# Patient Record
Sex: Female | Born: 1950
Health system: Southern US, Community
[De-identification: ages and names within clinical notes are randomized; demographics above are authoritative.]

## PROBLEM LIST (undated history)

## (undated) DIAGNOSIS — M1990 Unspecified osteoarthritis, unspecified site: Secondary | ICD-10-CM

## (undated) DIAGNOSIS — D649 Anemia, unspecified: Secondary | ICD-10-CM

## (undated) DIAGNOSIS — Z8489 Family history of other specified conditions: Secondary | ICD-10-CM

## (undated) DIAGNOSIS — J189 Pneumonia, unspecified organism: Secondary | ICD-10-CM

## (undated) DIAGNOSIS — Z87442 Personal history of urinary calculi: Secondary | ICD-10-CM

## (undated) DIAGNOSIS — F419 Anxiety disorder, unspecified: Secondary | ICD-10-CM

## (undated) DIAGNOSIS — F329 Major depressive disorder, single episode, unspecified: Secondary | ICD-10-CM

## (undated) DIAGNOSIS — I2510 Atherosclerotic heart disease of native coronary artery without angina pectoris: Secondary | ICD-10-CM

## (undated) DIAGNOSIS — E039 Hypothyroidism, unspecified: Secondary | ICD-10-CM

## (undated) DIAGNOSIS — K219 Gastro-esophageal reflux disease without esophagitis: Secondary | ICD-10-CM

## (undated) DIAGNOSIS — E079 Disorder of thyroid, unspecified: Secondary | ICD-10-CM

## (undated) DIAGNOSIS — Z973 Presence of spectacles and contact lenses: Secondary | ICD-10-CM

## (undated) DIAGNOSIS — IMO0001 Reserved for inherently not codable concepts without codable children: Secondary | ICD-10-CM

## (undated) DIAGNOSIS — F32A Depression, unspecified: Secondary | ICD-10-CM

## (undated) DIAGNOSIS — I10 Essential (primary) hypertension: Secondary | ICD-10-CM

## (undated) DIAGNOSIS — M75121 Complete rotator cuff tear or rupture of right shoulder, not specified as traumatic: Secondary | ICD-10-CM

## (undated) DIAGNOSIS — I2699 Other pulmonary embolism without acute cor pulmonale: Secondary | ICD-10-CM

## (undated) DIAGNOSIS — K5790 Diverticulosis of intestine, part unspecified, without perforation or abscess without bleeding: Secondary | ICD-10-CM

## (undated) DIAGNOSIS — K5792 Diverticulitis of intestine, part unspecified, without perforation or abscess without bleeding: Secondary | ICD-10-CM

## (undated) HISTORY — PX: KNEE CARTILAGE SURGERY: SHX688

## (undated) HISTORY — PX: ESOPHAGOGASTRODUODENOSCOPY ENDOSCOPY: SHX5814

## (undated) HISTORY — DX: Disorder of thyroid, unspecified: E07.9

## (undated) HISTORY — PX: OTHER SURGICAL HISTORY: SHX169

## (undated) HISTORY — PX: ABDOMINAL HYSTERECTOMY: SHX81

## (undated) HISTORY — PX: COLONOSCOPY: SHX174

## (undated) HISTORY — PX: CATARACT EXTRACTION, BILATERAL: SHX1313

---

## 1998-02-15 DIAGNOSIS — Z90710 Acquired absence of both cervix and uterus: Secondary | ICD-10-CM | POA: Diagnosis present

## 1998-10-20 ENCOUNTER — Encounter: Payer: Self-pay | Admitting: Internal Medicine

## 1998-10-20 ENCOUNTER — Ambulatory Visit: Admission: RE | Admit: 1998-10-20 | Discharge: 1998-10-20 | Payer: Self-pay | Admitting: Internal Medicine

## 1999-01-08 ENCOUNTER — Encounter: Admission: RE | Admit: 1999-01-08 | Discharge: 1999-04-08 | Payer: Self-pay | Admitting: Internal Medicine

## 1999-11-19 ENCOUNTER — Other Ambulatory Visit: Admission: RE | Admit: 1999-11-19 | Discharge: 1999-11-19 | Payer: Self-pay | Admitting: Internal Medicine

## 1999-12-02 ENCOUNTER — Encounter: Payer: Self-pay | Admitting: Internal Medicine

## 1999-12-02 ENCOUNTER — Ambulatory Visit (HOSPITAL_COMMUNITY): Admission: RE | Admit: 1999-12-02 | Discharge: 1999-12-02 | Payer: Self-pay | Admitting: Internal Medicine

## 2000-09-27 ENCOUNTER — Encounter: Payer: Self-pay | Admitting: Internal Medicine

## 2000-09-27 ENCOUNTER — Encounter: Admission: RE | Admit: 2000-09-27 | Discharge: 2000-09-27 | Payer: Self-pay | Admitting: Internal Medicine

## 2000-10-13 ENCOUNTER — Ambulatory Visit (HOSPITAL_COMMUNITY): Admission: RE | Admit: 2000-10-13 | Discharge: 2000-10-13 | Payer: Self-pay | Admitting: Cardiology

## 2000-10-16 ENCOUNTER — Ambulatory Visit (HOSPITAL_COMMUNITY): Admission: RE | Admit: 2000-10-16 | Discharge: 2000-10-16 | Payer: Self-pay | Admitting: Cardiology

## 2001-05-25 ENCOUNTER — Encounter: Payer: Self-pay | Admitting: Internal Medicine

## 2001-05-25 ENCOUNTER — Encounter: Admission: RE | Admit: 2001-05-25 | Discharge: 2001-05-25 | Payer: Self-pay | Admitting: Internal Medicine

## 2001-09-18 ENCOUNTER — Encounter: Payer: Self-pay | Admitting: Internal Medicine

## 2001-09-18 ENCOUNTER — Ambulatory Visit (HOSPITAL_COMMUNITY): Admission: RE | Admit: 2001-09-18 | Discharge: 2001-09-18 | Payer: Self-pay | Admitting: Internal Medicine

## 2002-11-21 ENCOUNTER — Other Ambulatory Visit: Admission: RE | Admit: 2002-11-21 | Discharge: 2002-11-21 | Payer: Self-pay | Admitting: Internal Medicine

## 2004-07-27 ENCOUNTER — Ambulatory Visit (HOSPITAL_COMMUNITY): Admission: RE | Admit: 2004-07-27 | Discharge: 2004-07-27 | Payer: Self-pay | Admitting: Internal Medicine

## 2005-08-18 ENCOUNTER — Ambulatory Visit (HOSPITAL_COMMUNITY): Admission: RE | Admit: 2005-08-18 | Discharge: 2005-08-18 | Payer: Self-pay | Admitting: Ophthalmology

## 2005-09-15 ENCOUNTER — Ambulatory Visit (HOSPITAL_COMMUNITY): Admission: RE | Admit: 2005-09-15 | Discharge: 2005-09-15 | Payer: Self-pay | Admitting: Ophthalmology

## 2005-12-07 ENCOUNTER — Ambulatory Visit (HOSPITAL_COMMUNITY): Admission: RE | Admit: 2005-12-07 | Discharge: 2005-12-07 | Payer: Self-pay | Admitting: Internal Medicine

## 2006-06-10 ENCOUNTER — Emergency Department (HOSPITAL_COMMUNITY): Admission: EM | Admit: 2006-06-10 | Discharge: 2006-06-10 | Payer: Self-pay | Admitting: Emergency Medicine

## 2006-07-26 ENCOUNTER — Ambulatory Visit (HOSPITAL_COMMUNITY): Admission: RE | Admit: 2006-07-26 | Discharge: 2006-07-26 | Payer: Self-pay | Admitting: Internal Medicine

## 2006-08-16 ENCOUNTER — Ambulatory Visit: Payer: Self-pay | Admitting: Internal Medicine

## 2006-09-05 LAB — LACTATE DEHYDROGENASE: LDH: 161 U/L (ref 94–250)

## 2006-09-05 LAB — CBC WITH DIFFERENTIAL/PLATELET
BASO%: 0.4 % (ref 0.0–2.0)
Basophils Absolute: 0 10*3/uL (ref 0.0–0.1)
EOS%: 7 % (ref 0.0–7.0)
MCH: 28.8 pg (ref 26.0–34.0)
MCHC: 33.8 g/dL (ref 32.0–36.0)
MCV: 85.1 fL (ref 81.0–101.0)
MONO%: 6.9 % (ref 0.0–13.0)
RBC: 4.49 10*6/uL (ref 3.70–5.32)
RDW: 13.6 % (ref 11.3–14.5)
lymph#: 3.1 10*3/uL (ref 0.9–3.3)

## 2006-10-11 ENCOUNTER — Ambulatory Visit (HOSPITAL_COMMUNITY): Admission: RE | Admit: 2006-10-11 | Discharge: 2006-10-11 | Payer: Self-pay | Admitting: Internal Medicine

## 2006-10-30 ENCOUNTER — Inpatient Hospital Stay (HOSPITAL_COMMUNITY): Admission: EM | Admit: 2006-10-30 | Discharge: 2006-10-31 | Payer: Self-pay | Admitting: Emergency Medicine

## 2006-12-07 ENCOUNTER — Ambulatory Visit: Payer: Self-pay | Admitting: Internal Medicine

## 2006-12-11 ENCOUNTER — Encounter (INDEPENDENT_AMBULATORY_CARE_PROVIDER_SITE_OTHER): Payer: Self-pay | Admitting: Specialist

## 2006-12-11 ENCOUNTER — Ambulatory Visit: Payer: Self-pay | Admitting: Internal Medicine

## 2006-12-11 DIAGNOSIS — K5730 Diverticulosis of large intestine without perforation or abscess without bleeding: Secondary | ICD-10-CM | POA: Insufficient documentation

## 2006-12-11 DIAGNOSIS — K648 Other hemorrhoids: Secondary | ICD-10-CM | POA: Insufficient documentation

## 2007-02-09 ENCOUNTER — Ambulatory Visit (HOSPITAL_COMMUNITY): Admission: RE | Admit: 2007-02-09 | Discharge: 2007-02-09 | Payer: Self-pay | Admitting: Internal Medicine

## 2007-02-21 ENCOUNTER — Encounter: Admission: RE | Admit: 2007-02-21 | Discharge: 2007-02-21 | Payer: Self-pay | Admitting: Internal Medicine

## 2007-03-09 ENCOUNTER — Ambulatory Visit (HOSPITAL_COMMUNITY): Admission: RE | Admit: 2007-03-09 | Discharge: 2007-03-09 | Payer: Self-pay | Admitting: Internal Medicine

## 2007-04-17 ENCOUNTER — Ambulatory Visit (HOSPITAL_BASED_OUTPATIENT_CLINIC_OR_DEPARTMENT_OTHER): Admission: RE | Admit: 2007-04-17 | Discharge: 2007-04-17 | Payer: Self-pay | Admitting: Orthopedic Surgery

## 2007-06-21 ENCOUNTER — Encounter: Admission: RE | Admit: 2007-06-21 | Discharge: 2007-08-23 | Payer: Self-pay | Admitting: Orthopedic Surgery

## 2008-04-11 ENCOUNTER — Inpatient Hospital Stay (HOSPITAL_COMMUNITY): Admission: EM | Admit: 2008-04-11 | Discharge: 2008-04-16 | Payer: Self-pay | Admitting: Emergency Medicine

## 2008-04-12 ENCOUNTER — Encounter: Payer: Self-pay | Admitting: Internal Medicine

## 2008-04-14 ENCOUNTER — Encounter: Payer: Self-pay | Admitting: Gastroenterology

## 2008-04-16 ENCOUNTER — Encounter: Payer: Self-pay | Admitting: Internal Medicine

## 2008-04-17 ENCOUNTER — Ambulatory Visit: Payer: Self-pay | Admitting: Gastroenterology

## 2008-04-29 DIAGNOSIS — E039 Hypothyroidism, unspecified: Secondary | ICD-10-CM | POA: Insufficient documentation

## 2008-04-29 DIAGNOSIS — I10 Essential (primary) hypertension: Secondary | ICD-10-CM | POA: Insufficient documentation

## 2008-04-29 DIAGNOSIS — J45909 Unspecified asthma, uncomplicated: Secondary | ICD-10-CM | POA: Insufficient documentation

## 2008-04-29 DIAGNOSIS — E669 Obesity, unspecified: Secondary | ICD-10-CM | POA: Insufficient documentation

## 2008-04-29 DIAGNOSIS — F32A Depression, unspecified: Secondary | ICD-10-CM | POA: Insufficient documentation

## 2008-04-29 DIAGNOSIS — F329 Major depressive disorder, single episode, unspecified: Secondary | ICD-10-CM

## 2008-04-30 ENCOUNTER — Ambulatory Visit: Payer: Self-pay | Admitting: Internal Medicine

## 2008-04-30 ENCOUNTER — Telehealth (INDEPENDENT_AMBULATORY_CARE_PROVIDER_SITE_OTHER): Payer: Self-pay

## 2008-04-30 LAB — CONVERTED CEMR LAB
Basophils Relative: 0 % (ref 0.0–1.0)
Folate: 4.3 ng/mL
HCT: 38.4 % (ref 36.0–46.0)
Hemoglobin: 12.6 g/dL (ref 12.0–15.0)
Iron: 49 ug/dL (ref 42–145)
Monocytes Absolute: 0.4 10*3/uL (ref 0.1–1.0)
Monocytes Relative: 2.9 % — ABNORMAL LOW (ref 3.0–12.0)
Neutro Abs: 11.9 10*3/uL — ABNORMAL HIGH (ref 1.4–7.7)
RBC: 4.86 M/uL (ref 3.87–5.11)
RDW: 15 % — ABNORMAL HIGH (ref 11.5–14.6)
Vitamin B-12: 266 pg/mL (ref 211–911)

## 2008-05-05 ENCOUNTER — Ambulatory Visit: Payer: Self-pay | Admitting: Internal Medicine

## 2008-05-05 DIAGNOSIS — E538 Deficiency of other specified B group vitamins: Secondary | ICD-10-CM | POA: Insufficient documentation

## 2008-06-13 ENCOUNTER — Ambulatory Visit: Payer: Self-pay | Admitting: Internal Medicine

## 2008-06-27 ENCOUNTER — Observation Stay (HOSPITAL_COMMUNITY): Admission: EM | Admit: 2008-06-27 | Discharge: 2008-06-28 | Payer: Self-pay | Admitting: Emergency Medicine

## 2008-07-08 ENCOUNTER — Telehealth: Payer: Self-pay | Admitting: Internal Medicine

## 2008-07-10 ENCOUNTER — Ambulatory Visit: Payer: Self-pay | Admitting: Internal Medicine

## 2008-07-24 ENCOUNTER — Other Ambulatory Visit: Admission: RE | Admit: 2008-07-24 | Discharge: 2008-07-24 | Payer: Self-pay | Admitting: Internal Medicine

## 2008-08-27 ENCOUNTER — Ambulatory Visit (HOSPITAL_COMMUNITY): Admission: RE | Admit: 2008-08-27 | Discharge: 2008-08-27 | Payer: Self-pay | Admitting: Cardiology

## 2008-08-28 ENCOUNTER — Ambulatory Visit (HOSPITAL_COMMUNITY): Admission: RE | Admit: 2008-08-28 | Discharge: 2008-08-28 | Payer: Self-pay | Admitting: Cardiology

## 2008-09-04 ENCOUNTER — Ambulatory Visit: Payer: Self-pay | Admitting: Internal Medicine

## 2008-09-13 ENCOUNTER — Emergency Department (HOSPITAL_COMMUNITY): Admission: EM | Admit: 2008-09-13 | Discharge: 2008-09-13 | Payer: Self-pay | Admitting: Emergency Medicine

## 2008-09-29 ENCOUNTER — Ambulatory Visit: Payer: Self-pay | Admitting: Internal Medicine

## 2008-10-01 LAB — CONVERTED CEMR LAB
Basophils Absolute: 0.2 10*3/uL — ABNORMAL HIGH (ref 0.0–0.1)
Eosinophils Absolute: 0.8 10*3/uL — ABNORMAL HIGH (ref 0.0–0.7)
Ferritin: 9 ng/mL — ABNORMAL LOW (ref 10.0–291.0)
HCT: 37.5 % (ref 36.0–46.0)
MCV: 80 fL (ref 78.0–100.0)
Monocytes Absolute: 0.6 10*3/uL (ref 0.1–1.0)
Platelets: 351 10*3/uL (ref 150–400)
RDW: 14 % (ref 11.5–14.6)

## 2008-10-07 ENCOUNTER — Ambulatory Visit: Payer: Self-pay | Admitting: Internal Medicine

## 2009-01-15 ENCOUNTER — Ambulatory Visit: Payer: Self-pay | Admitting: Internal Medicine

## 2009-01-16 LAB — CONVERTED CEMR LAB
Eosinophils Absolute: 0.8 10*3/uL — ABNORMAL HIGH (ref 0.0–0.7)
Eosinophils Relative: 6.7 % — ABNORMAL HIGH (ref 0.0–5.0)
Lymphocytes Relative: 32.8 % (ref 12.0–46.0)
Monocytes Relative: 5.5 % (ref 3.0–12.0)
Neutrophils Relative %: 54 % (ref 43.0–77.0)
Platelets: 332 10*3/uL (ref 150–400)
WBC: 11.7 10*3/uL — ABNORMAL HIGH (ref 4.5–10.5)

## 2009-01-19 ENCOUNTER — Telehealth: Payer: Self-pay | Admitting: Internal Medicine

## 2009-02-05 ENCOUNTER — Encounter: Payer: Self-pay | Admitting: Internal Medicine

## 2009-02-05 ENCOUNTER — Encounter (HOSPITAL_COMMUNITY): Admission: RE | Admit: 2009-02-05 | Discharge: 2009-03-19 | Payer: Self-pay | Admitting: Internal Medicine

## 2009-10-01 ENCOUNTER — Encounter (INDEPENDENT_AMBULATORY_CARE_PROVIDER_SITE_OTHER): Payer: Self-pay | Admitting: Otolaryngology

## 2009-10-01 ENCOUNTER — Ambulatory Visit (HOSPITAL_COMMUNITY): Admission: RE | Admit: 2009-10-01 | Discharge: 2009-10-02 | Payer: Self-pay | Admitting: Otolaryngology

## 2009-11-04 ENCOUNTER — Telehealth: Payer: Self-pay | Admitting: Internal Medicine

## 2009-11-04 ENCOUNTER — Ambulatory Visit: Payer: Self-pay | Admitting: Gastroenterology

## 2009-11-04 LAB — CONVERTED CEMR LAB: Creatinine, Ser: 0.9 mg/dL (ref 0.4–1.2)

## 2009-11-05 ENCOUNTER — Ambulatory Visit: Payer: Self-pay | Admitting: Cardiology

## 2009-11-05 LAB — CONVERTED CEMR LAB
CRP, High Sensitivity: 62 — ABNORMAL HIGH (ref 0.00–5.00)
Sed Rate: 23 mm/hr — ABNORMAL HIGH (ref 0–22)

## 2009-11-30 ENCOUNTER — Ambulatory Visit: Payer: Self-pay | Admitting: Internal Medicine

## 2009-11-30 LAB — CONVERTED CEMR LAB
Eosinophils Absolute: 0.6 10*3/uL (ref 0.0–0.7)
Eosinophils Relative: 5.3 % — ABNORMAL HIGH (ref 0.0–5.0)
Ferritin: 18.9 ng/mL (ref 10.0–291.0)
MCV: 91.4 fL (ref 78.0–100.0)
Monocytes Absolute: 0.8 10*3/uL (ref 0.1–1.0)
Neutrophils Relative %: 45.6 % (ref 43.0–77.0)
Platelets: 298 10*3/uL (ref 150.0–400.0)
Vitamin B-12: 761 pg/mL (ref 211–911)
WBC: 10.6 10*3/uL — ABNORMAL HIGH (ref 4.5–10.5)

## 2009-12-01 ENCOUNTER — Ambulatory Visit: Payer: Self-pay | Admitting: Internal Medicine

## 2009-12-04 ENCOUNTER — Encounter: Payer: Self-pay | Admitting: Internal Medicine

## 2009-12-11 ENCOUNTER — Ambulatory Visit: Payer: Self-pay | Admitting: Internal Medicine

## 2009-12-18 ENCOUNTER — Ambulatory Visit: Payer: Self-pay | Admitting: Internal Medicine

## 2010-01-07 ENCOUNTER — Encounter: Admission: RE | Admit: 2010-01-07 | Discharge: 2010-01-07 | Payer: Self-pay | Admitting: Internal Medicine

## 2010-01-09 ENCOUNTER — Emergency Department (HOSPITAL_BASED_OUTPATIENT_CLINIC_OR_DEPARTMENT_OTHER): Admission: EM | Admit: 2010-01-09 | Discharge: 2010-01-10 | Payer: Self-pay | Admitting: Emergency Medicine

## 2010-01-10 ENCOUNTER — Ambulatory Visit: Payer: Self-pay | Admitting: Diagnostic Radiology

## 2010-01-14 ENCOUNTER — Ambulatory Visit: Payer: Self-pay | Admitting: Internal Medicine

## 2010-01-21 ENCOUNTER — Encounter: Admission: RE | Admit: 2010-01-21 | Discharge: 2010-01-21 | Payer: Self-pay | Admitting: Internal Medicine

## 2010-01-21 ENCOUNTER — Ambulatory Visit: Payer: Self-pay | Admitting: Internal Medicine

## 2010-02-16 ENCOUNTER — Observation Stay (HOSPITAL_COMMUNITY): Admission: EM | Admit: 2010-02-16 | Discharge: 2010-02-17 | Payer: Self-pay | Admitting: Emergency Medicine

## 2010-02-16 ENCOUNTER — Ambulatory Visit: Payer: Self-pay | Admitting: Cardiology

## 2010-02-16 ENCOUNTER — Emergency Department (HOSPITAL_COMMUNITY): Admission: EM | Admit: 2010-02-16 | Discharge: 2010-02-16 | Payer: Self-pay | Admitting: Family Medicine

## 2010-02-17 ENCOUNTER — Encounter (INDEPENDENT_AMBULATORY_CARE_PROVIDER_SITE_OTHER): Payer: Self-pay | Admitting: Emergency Medicine

## 2010-02-17 ENCOUNTER — Encounter: Payer: Self-pay | Admitting: Internal Medicine

## 2010-02-17 ENCOUNTER — Encounter (INDEPENDENT_AMBULATORY_CARE_PROVIDER_SITE_OTHER): Payer: Self-pay | Admitting: *Deleted

## 2010-02-18 ENCOUNTER — Telehealth: Payer: Self-pay | Admitting: Internal Medicine

## 2010-02-19 ENCOUNTER — Inpatient Hospital Stay (HOSPITAL_COMMUNITY): Admission: EM | Admit: 2010-02-19 | Discharge: 2010-02-21 | Payer: Self-pay | Admitting: Gastroenterology

## 2010-02-19 ENCOUNTER — Ambulatory Visit: Payer: Self-pay | Admitting: Internal Medicine

## 2010-02-19 ENCOUNTER — Telehealth: Payer: Self-pay | Admitting: Internal Medicine

## 2010-02-19 LAB — CONVERTED CEMR LAB
Basophils Relative: 0.3 % (ref 0.0–3.0)
Calcium: 9 mg/dL (ref 8.4–10.5)
Chloride: 95 meq/L — ABNORMAL LOW (ref 96–112)
Creatinine, Ser: 0.8 mg/dL (ref 0.4–1.2)
Eosinophils Relative: 13 % — ABNORMAL HIGH (ref 0.0–5.0)
GFR calc non Af Amer: 78.41 mL/min (ref >60)
Lymphocytes Relative: 22.9 % (ref 12.0–46.0)
Monocytes Relative: 4 % (ref 3.0–12.0)
Neutrophils Relative %: 59.8 % (ref 43.0–77.0)
RBC: 5.14 M/uL — ABNORMAL HIGH (ref 3.87–5.11)
WBC: 16 10*3/uL — ABNORMAL HIGH (ref 4.5–10.5)

## 2010-02-20 ENCOUNTER — Encounter: Payer: Self-pay | Admitting: Internal Medicine

## 2010-02-20 ENCOUNTER — Encounter: Payer: Self-pay | Admitting: Gastroenterology

## 2010-02-21 ENCOUNTER — Encounter: Payer: Self-pay | Admitting: Gastroenterology

## 2010-02-26 ENCOUNTER — Ambulatory Visit: Payer: Self-pay | Admitting: Internal Medicine

## 2010-02-26 DIAGNOSIS — K219 Gastro-esophageal reflux disease without esophagitis: Secondary | ICD-10-CM | POA: Insufficient documentation

## 2010-12-02 ENCOUNTER — Ambulatory Visit
Admission: RE | Admit: 2010-12-02 | Discharge: 2010-12-02 | Payer: Self-pay | Source: Home / Self Care | Attending: Internal Medicine | Admitting: Internal Medicine

## 2010-12-16 NOTE — Procedures (Signed)
Summary: Flexible Sigmoidoscopy  Patient: [name] Note: All result statuses are Final unless otherwise noted.  Tests: (1) Flexible Sigmoidoscopy (FLX)  FLX Flexible Sigmoidoscopy                             DONE     [facility]     [address]     [place], [place]  [address]           FLEXIBLE SIGMOIDOSCOPY PROCEDURE REPORT           PATIENT:  [name], [name]  MR#:  [id number]     BIRTHDATE:  [birth year], 58 yrs. old  GENDER:  female           ENDOSCOPIST:  Iva Boop, MD, [facility]           PROCEDURE DATE:  12/01/2009     PROCEDURE:  Flexible Sigmoidoscopy with biopsy     ASA CLASS:  Class III     INDICATIONS:  abnormal imaging recent LLQ pain, diarrhea and     rectal bleeding     CT suggested mild diverticulitis     hx of segmental left colitis in past 2009 and also prior     incidental cecal ulcers (2008)           MEDICATIONS:   Fentanyl 50 mcg IV, Versed 7 mg IV           DESCRIPTION OF PROCEDURE:   After the risks benefits and     alternatives of the procedure were thoroughly explained, informed     consent was obtained.  Digital rectal exam was performed and     revealed no abnormalities.   The [id number] endoscope     was introduced through the anus and advanced to the descending     colon, without limitations.  The quality of the prep was fair on     descending colon, good elsewhere. The instrument was then slowly     withdrawn as the mucosa was fully examined.     <<PROCEDUREIMAGES>>           Moderate diverticulosis was found in the sigmoid colon.  The     examination was otherwise normal. Random biopsies were obtained     and sent to pathology.   Retroflexed views in the rectum revealed     no abnormalities.    The scope was then withdrawn from the patient     and the procedure terminated.           COMPLICATIONS:  None           ENDOSCOPIC IMPRESSION:     1) Moderate diverticulosis in the sigmoid colon     2) Otherwise  normal examination to descending colon     3) No colitis evident. random biopsied taken. Not clear what     recent problems were caused by, am not convinced CT findings are     diagnostic of diverticulitis. I do think underlying IBS is likely     but ? other process given hx.           RECOMMENDATIONS:     1) await biopsy results     2) Needs oral iron if she can tolerate. if not then would be     reasonable to boost iron stores with IV iron again. Hgb is normal     but ferritin only 18 ( very low  normal). B12 ok.     3) anticipate at least a follow-up in the office in 2 months to     reassess           Iva Boop, MD, [facility]           CC:  Sharlet Salina, MD     The Patient           n.     eSIGNED:   Iva Boop at 12/01/2009 05:05 PM           Florestine Avers, [id number]  Note: An exclamation mark (!) indicates a result that was not dispersed into the flowsheet. Document Creation Date: 12/01/2009 5:05 PM _______________________________________________________________________  (1) Order result status: Final Collection or observation date-time: 12/01/2009 16:52 Requested date-time:  Receipt date-time:  Reported date-time:  Referring Physician:   Ordering Physician: Stan Head [id number]) Specimen Source:  Source: Launa Grill Order Number: [id number] Lab site:

## 2010-12-16 NOTE — Letter (Signed)
Summary: Patient Notice- Colon Biospy Results  [facility]  [address] [place], [place] [address]   Phone: [phone]  Fax: [phone]        December 04, 2009 MRN: [id number]    KENTRELL GUETTLER [address] [place], [place]  [address]    Dear Ms. Wedeking,  I am pleased to inform you that the biopsies taken during your recent colonoscopy did not show any evidence of colitis upon pathologic examination.  Please call [phone] to schedule a return visit to review      your condition. You should be seen in late March, early april 2011.   Continue with the treatment plan as outlined on the day of your      exam.  Please call us if you are having persistent problems or have questions about your condition that have not been fully answered at this time.  Sincerely,  Iva Boop MD, [facility]   This letter has been electronically signed by your physician.  Appended Document: Patient Notice- Colon Biospy Results Letter mailed 1.24.11.

## 2010-12-16 NOTE — Progress Notes (Signed)
Summary: Talk to nurse  Phone Note Call from Patient Call back at Home Phone [phone]   Call For: Dr [name] Summary of Call: It's a long story-wants to talk to nurse directly. Initial call taken by: Leanor Kail [facility],  February 19, 2010 9:16 AM  Follow-up for Phone Call        Patient  c/o vomiting, nausea and pain that started last night.  Her CP she was seen for in the ER on Tues/Wed has resolved.  Patient c/o "left of midline" abdominal pain.  She had a large BM last night, that progressed to watery stools and vomiting. She has had no further stools since last night, but continued vomiting.  Denies fever.  Patient  wants to be seen here today.  I have advised her that we are unable to see her today.  I have asked her to start on a clear liquid diet and slowly advance her diet as tolerated.  Patient has hydrocodone she takes for pain.  She is advised she can take that for pain.   Dr Juanda Chance please advise if needs additional orders. Follow-up by: Darcey Nora RN, CGRN,  February 19, 2010 9:54 AM  Additional Follow-up for Phone Call Additional follow up Details #1::        I reviewed the above with Dr Juanda Chance patient to come for CBC, BMET now.  Once lab is complete she is asked to come to the 3rd floor until we have the results.  Patient  lives in [place] she says it will take her an hour to get here.   Additional Follow-up by: Darcey Nora RN, CGRN,  February 19, 2010 10:11 AM    Additional Follow-up for Phone Call Additional follow up Details #2::    Labs reviewed with Dr Juanda Chance.  Patient  will see Dr Juanda Chance as an addon today Follow-up by: Darcey Nora RN, CGRN,  February 19, 2010 12:12 PM

## 2010-12-16 NOTE — Assessment & Plan Note (Signed)
Summary: add on for elevated WBC/lk.   History of Present Illness Visit Type: Follow-up Visit Primary GI MD: Stan Head MD [facility] Primary Provider: Marlan Palau, MD Requesting Provider: n/a Chief Complaint: Add on for elevated WBC. History of Present Illness:   60 year old white female, acute work-in, with the 2 days of nausea vomiting and diarrhea. She was seen in the emergency room 4 days ago for chest pain which responded to Protonix IV and was sent home. Cardiologu eval was negative. She subsequently developed  nausea ,vomiting and severe left lower quadrant abdominal pain resulting in diarrhea but no rectal bleeding. There was no fever. She came to the office this morning  continuing to  vomit clear liquid. There is a history of  cecal ulceration on colonoscopy all in January 2008 raising a question of  Crohn's disease. She had diverticulosis in the left colon and small hemorrhoids. Upper endoscopy at that time was essentially normal. She has been on the Nexium 40 mg twice a day. This morning her potassium is 2.7 and her white cell count is 16,000.   GI Review of Systems    Reports abdominal pain, nausea, and  vomiting.     Location of  Abdominal pain: LLQ.    Denies acid reflux, belching, bloating, chest pain, dysphagia with liquids, dysphagia with solids, heartburn, loss of appetite, vomiting blood, weight loss, and  weight gain.      Reports diarrhea.     Denies anal fissure, black tarry stools, change in bowel habit, constipation, diverticulosis, fecal incontinence, heme positive stool, hemorrhoids, irritable bowel syndrome, jaundice, light color stool, liver problems, rectal bleeding, and  rectal pain.    Current Medications (verified): 1)  Nexium 40 Mg  Cpdr (Esomeprazole Magnesium) .... Two Times A Day 2)  Lasix 40 Mg  Tabs (Furosemide) .... Once Daily 3)  Synthroid 150 Mcg  Tabs (Levothyroxine Sodium) .... Once Daily 4)  Bayer Childrens Aspirin 81 Mg  Chew (Aspirin) .... Once  Daily 5)  Norvasc 2.5 Mg Tabs (Amlodipine Besylate) .Marland Kitchen.. 1 Once Daily 6)  Levbid 0.375 Mg  Tb12 (Hyoscyamine Sulfate) .Marland Kitchen.. 1 By Mouth Two Times A Day--Pt Needs Office Visit 7)  Nasonex 50 Mcg/act Susp (Mometasone Furoate) .... Bid 8)  Zyflo Cr 600 Mg Xr12h-Tab (Zileuton) .... 2 By Mouth Two Times A Day 9)  Metoprolol Tartrate 25 Mg Tabs (Metoprolol Tartrate) .Marland Kitchen.. 1 By Mouth Once Daily 10)  Flonase 50 Mcg/act Susp (Fluticasone Propionate) .... As Needed 11)  Ventolin Hfa 108 (90 Base) Mcg/act Aers (Albuterol Sulfate) .... As Needed 12)  Ambien 10 Mg Tabs (Zolpidem Tartrate) .Marland Kitchen.. 1 By Mouth At Bedtime 13)  Cymbalta 60 Mg Cpep (Duloxetine Hcl) .Marland Kitchen.. 1 By Mouth Once Daily 14)  Trazodone Hcl 150 Mg Tabs (Trazodone Hcl) .Marland Kitchen.. 1 By Mouth At Bedtime 15)  Saline Nasal Spray 0.65 % Soln (Saline) .... Had Sinus Surgery, Uses Several Times A Day 16)  Hydrocodone-Acetaminophen 5-325 Mg Tabs (Hydrocodone-Acetaminophen) .... Take 1 Tab Every 6 Hours As Needed For Pain 17)  Protonix 40 Mg Tbec (Pantoprazole Sodium) .... Once Daily  Allergies (verified): 1)  ! Levaquin 2)  ! Lisinopril  Past History:  Past Medical History: Reviewed history from 11/30/2009 and no changes required. DIVERTICULOSIS, COLON  HEMORRHOIDS, INTERNAL OBESITY  HERPES ZOSTER 7/07 ALLERGIC RHINITIS ANAPHYLAXIS HEADACHE, CHRONIC  DEPRESSION  HYPOTHYROIDISM  ASTHMA  HYPERTENSION  LICHEN PLANUS - ORAL IRON-DEFICIENCY ISCHEMIC COLITIS (SUSPECTED) ? DIVERTICULITIS  Past Surgical History: Reviewed history from 11/04/2009 and no changes required.  Cataract Extraction Bilateral Hysterectomy Right Foot Surgery for toe deformity Nasal Surgery---Oct 01, 2009  Family History: Reviewed history from 04/29/2008 and no changes required. No FH of Colon Cancer: Family History of Diabetes: Father Family History of Heart Disease: Mother  Social History: Reviewed history from 04/30/2008 and no changes required. 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  Alcohol Use - no Illicit Drug Use - no Patient does not get regular exercise.   Review of Systems       The patient complains of allergy/sinus and fatigue.  The patient denies anemia, anxiety-new, arthritis/joint pain, back pain, blood in urine, breast changes/lumps, change in vision, confusion, cough, coughing up blood, depression-new, fainting, fever, headaches-new, hearing problems, heart murmur, heart rhythm changes, itching, menstrual pain, muscle pains/cramps, night sweats, nosebleeds, pregnancy symptoms, shortness of breath, skin rash, sleeping problems, sore throat, swelling of feet/legs, swollen lymph glands, thirst - excessive , urination - excessive , urination changes/pain, urine leakage, vision changes, and voice change.         Pertinent positive and negative review of systems were noted in the above HPI. All other ROS was otherwise negative.   Vital Signs:  Patient profile:   60 year old female Height:      65 inches Weight:      283.25 pounds BMI:     47.31 Temp:     98.1 degrees F oral Pulse rate:   108 / minute Pulse rhythm:   regular BP sitting:   122 / 90  (left arm) Cuff size:   large  Vitals Entered By: June McMurray CMA Duncan Dull) (February 19, 2010 12:16 PM)  Physical Exam  General:  obese very pleasant lady in acute distress throwing up into a pressure can Eyes:  nonicteric Mouth:  normal oral mucosa Neck:  thick neck no adenopathy Lungs:  clear lungs normal breath sounds Heart:  rapid S1-S2 no murmur Abdomen:  obese abdomen with the soft bowel sounds and marked tenderness in left lower quadrant. No rebound. Liver edge and closed the margin. There are no surgical scars. No CVA tenderness Extremities:  no edema Skin:  no rash or stigmata of chronic liver disease Psych:  Alert and cooperative. Normal mood and affect.   Impression & Recommendations:  Problem # 1:  Hx of COLITIS (ICD-558.9) questionable history of  colitis now with acute nausea vomiting and diarrhea consistent with acute gastroenteritis. Rule out recurrent ischemic colitis. Rule out small bowel obstruction although patient has never had abdominal surgery. Rule out inflammatory bowel disease which  has been suspected but not proven. Patient has a severe hypokalemia and leukocytosis which warrant   admission to the hospital, rehydration and observation, After appropriate hydration and potassium replacement we will obtain CT scan of the abdomen and pelvis. Will start Cipro 400 mg IV q.12 hours for possible diverticulitis  Problem # 2:  CHANGE IN BOWELS (ICD-787.99) diarrhea rule out infectious origin. 3 Will obtain stool cultures C. difficile and O&P , we will hold off antibiotics pending baseline blood tests.  Patient Instructions: 1)  admit for 24 observation hydration and potassium replacement #2 orders written 2)  I have notified of cerebral event PA of patient's admission 3)  Dr. Russella Dar will be on call this weekend 4)  Copy sent to : Dr [name]

## 2010-12-16 NOTE — Progress Notes (Signed)
Summary: triage  Phone Note Call from Patient Call back at Home Phone [phone]   Caller: Patient Call For: Heide Guile Reason for Call: Talk to Nurse Summary of Call: Patient states the she was in the ER from Tues until last night do to chest pain states that cardiac work up they did was negative and ct showed thickened esophagus, she wants to be seen before first available 5-16. Initial call taken by: Tawni Levy,  February 18, 2010 9:28 AM  Follow-up for Phone Call        Thickened esophagus seen on CT scan, was treated with Protonix and asked to follow up with GI.  Patient  to come and see Dr [name] 02/26/10 1:45. Follow-up by: Darcey Nora RN, CGRN,  February 18, 2010 11:53 AM

## 2010-12-16 NOTE — Letter (Signed)
Summary: [facility]  [address] [place], [place] [address]   Phone: [phone]  Fax: [phone]       [name]    Aug 19, 1951    MRN: [id number]        Procedure Day /Date:TUESDAY 12/01/2009     Arrival Time: 3PM     Procedure Time: 4PM     Location of Procedure:                    X  [facility] (4th Floor)    PREPARATION FOR FLEXIBLE SIGMOIDOSCOPY WITH 2 FLEET ENEMAS  Prior to the day before your procedure, Purchase TWO  Fleet Enemas from the laxative section of your drugstore.  _________________________________________________________________________________________________  THE DAY BEFORE YOUR PROCEDURE             DATE: 11/30/2009    DAY:MON  1.   Have a clear liquid dinner the night before your procedure.  2.   Do not drink anything colored red or purple.  Avoid juices with pulp.  No orange juice.              CLEAR LIQUIDS INCLUDE: Water Jello Ice Popsicles Tea (sugar ok, no milk/cream) Powdered fruit flavored drinks Coffee (sugar ok, no milk/cream) Gatorade Juice: apple, white grape, white cranberry  Lemonade Clear bullion, consomm, broth Carbonated beverages (any kind) Strained chicken noodle soup Hard Candy        3. Continue clear liquids                       ___________________________________________________________________________________________________  THE DAY OF YOUR PROCEDURE            DATE: 12/01/2009 DAY: Tuesday       . 1  Use 1 Fleet enema 2 hours prior to your test    2 Use Fleet Enema one hour prior to coming for procedure.  3   You may drink clear liquids until 2:00pm (2 hours before exam)       MEDICATION INSTRUCTIONS  Unless otherwise instructed, you should take regular prescription medications with a small sip of water as early as possible the morning of your procedure.          OTHER INSTRUCTIONS  You will need a responsible adult at least 59  years of age to accompany you and drive you home.   This person must remain in the waiting room during your procedure.  Wear loose fitting clothing that is easily removed.  Leave jewelry and other valuables at home.  However, you may wish to bring a book to read or an iPod/MP3 player to listen to music as you wait for your procedure to start.  Remove all body piercing jewelry and leave at home.  Total time from sign-in until discharge is approximately 2-3 hours.  You should go home directly after your procedure and rest.  You can resume normal activities the day after your procedure.  The day of your procedure you should not:   Drive   Make legal decisions   Operate machinery   Drink alcohol   Return to work  You will receive specific instructions about eating, activities and medications before you leave.   The above instructions have been reviewed and explained to me by   _______________________    I fully understand and can verbalize these instructions _____________________________ Date _________

## 2010-12-16 NOTE — Assessment & Plan Note (Signed)
Summary: follow diverticulitis/lk   History of Present Illness Visit Type: Follow-up Visit Primary GI MD: Stan Head MD [facility] Primary Provider: Marlan Palau, MD Requesting Provider: n/a Chief Complaint: Diverticulitis improved, no pain History of Present Illness:   2-3 episodes of diarrhea with associaed vomiting (precipated by pain) Stools are still yellowis and have a different smell. Still getting a little pain in LLQ, not constant like it was when she presented in Dec. Has not had follow-up of anemia frm last year.  12/23 evaluation for intense LLQ pain with diarrhea and rectal bleeding her CT suggested mild Left-sdied diverticulitis, she has completed cipro and Flagyl CRP and ESR were both elevated   GI Review of Systems    Reports abdominal pain and  nausea.      Denies acid reflux, belching, bloating, chest pain, dysphagia with liquids, dysphagia with solids, heartburn, loss of appetite, vomiting, vomiting blood, weight loss, and  weight gain.      Reports diarrhea and  diverticulosis.     Denies anal fissure, black tarry stools, change in bowel habit, constipation, fecal incontinence, heme positive stool, hemorrhoids, irritable bowel syndrome, jaundice, light color stool, liver problems, rectal bleeding, and  rectal pain.    Current Medications (verified): 1)  Nexium 40 Mg  Cpdr (Esomeprazole Magnesium) .... Two Times A Day 2)  Lasix 40 Mg  Tabs (Furosemide) .... Once Daily 3)  Synthroid 150 Mcg  Tabs (Levothyroxine Sodium) .... Once Daily 4)  Bayer Childrens Aspirin 81 Mg  Chew (Aspirin) .... Once Daily 5)  Norvasc 2.5 Mg Tabs (Amlodipine Besylate) .Marland Kitchen.. 1 Once Daily 6)  Levbid 0.375 Mg  Tb12 (Hyoscyamine Sulfate) .Marland Kitchen.. 1 By Mouth Two Times A Day 7)  Nasonex 50 Mcg/act Susp (Mometasone Furoate) .... Bid 8)  Zyflo Cr 600 Mg Xr12h-Tab (Zileuton) .... 2 By Mouth Two Times A Day 9)  Metoprolol Tartrate 25 Mg Tabs (Metoprolol Tartrate) .Marland Kitchen.. 1 By Mouth Once Daily 10)  Flonase 50  Mcg/act Susp (Fluticasone Propionate) .... As Needed 11)  Ventolin Hfa 108 (90 Base) Mcg/act Aers (Albuterol Sulfate) .... As Needed 12)  Ambien 10 Mg Tabs (Zolpidem Tartrate) .Marland Kitchen.. 1 By Mouth At Bedtime 13)  Cymbalta 60 Mg Cpep (Duloxetine Hcl) .Marland Kitchen.. 1 By Mouth Once Daily 14)  Trazodone Hcl 150 Mg Tabs (Trazodone Hcl) .Marland Kitchen.. 1 By Mouth At Bedtime 15)  Saline Nasal Spray 0.65 % Soln (Saline) .... Had Sinus Surgery, Uses Several Times A Day 16)  Hydrocodone-Acetaminophen 5-325 Mg Tabs (Hydrocodone-Acetaminophen) .... Take 1 Tab Every 6 Hours As Needed For Pain  Allergies (verified): 1)  ! Levaquin 2)  ! Lisinopril  Past History:  Past Medical History: DIVERTICULOSIS, COLON  HEMORRHOIDS, INTERNAL OBESITY  HERPES ZOSTER 7/07 ALLERGIC RHINITIS ANAPHYLAXIS HEADACHE, CHRONIC  DEPRESSION  HYPOTHYROIDISM  ASTHMA  HYPERTENSION  LICHEN PLANUS - ORAL IRON-DEFICIENCY ISCHEMIC COLITIS (SUSPECTED) ? DIVERTICULITIS  Past Surgical History: Reviewed history from 11/04/2009 and no changes required. Cataract Extraction Bilateral Hysterectomy Right Foot Surgery for toe deformity Nasal Surgery---Oct 01, 2009  Family History: Reviewed history from 04/29/2008 and no changes required. No FH of Colon Cancer: Family History of Diabetes: Father Family History of Heart Disease: Mother  Social History: Reviewed history from 04/30/2008 and no changes required. 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  Alcohol Use - no Illicit Drug Use - no Patient does not get regular exercise.   Vital Signs:  Patient profile:   60 year old female Height:      65 inches Weight:  296.38 pounds BMI:     49.50 Pulse rate:   88 / minute Pulse rhythm:   regular BP sitting:   148 / 86  (left arm) Cuff size:   large  Vitals Entered By: June McMurray CMA [facility] Dull) (November 30, 2009 4:02 PM)  Physical Exam  General:  obese.   Abdomen:  obese, soft, minimally tender LLQ   Impression &  Recommendations:  Problem # 1:  NONSPECIFIC ABN FINDING RAD & OTH EXAM GI TRACT (ICD-793.4) Assessment New recent recurrent LLQ pain and diarrhea, original presentation in 2009 suggested IBD vs. ischemia has been ok without IBD meds, f/u flex sig was negative then cause of those problems and current problems not clear ? diverticulitis vs. a recurrent colitis flex sig to assess  Orders: Flex with Sedation (Flex w/Sed)  Problem # 2:  IRON DEFICIENCY (ICD-280.9) Assessment: Unchanged needs f/u cbc and ferritin  Problem # 3:  VITAMIN B12 DEFICIENCY (ICD-266.2) Assessment: Unchanged  low normal B12 last year, needs follow-up level after injection x 1  Orders: TLB-CBC Platelet - w/Differential (85025-CBCD) TLB-B12, Serum-Total ONLY (16109-U04) TLB-Ferritin (82728-FER)  Patient Instructions: 1)  Your procedure is scheduled for 12/01/2009 at 4pm 2)  You will need to purchase 2 fleet enemas from your drug store tonight 3)  cc. Dr [name] 4)  The medication list was reviewed and reconciled.  All changed / newly prescribed medications were explained.  A complete medication list was provided to the patient / caregiver.

## 2010-12-16 NOTE — Assessment & Plan Note (Signed)
Summary: thickened esophagus on CT/CP/sheri   History of Present Illness Visit Type: Follow-up Visit Primary GI MD: Stan Head MD [facility] Primary Provider: Marlan Palau, MD Requesting Provider: n/a Chief Complaint: thickening of esophagus History of Present Illness:   ED visit with chest pain (negative CT chest for PE but esophagus thickened - underdistended),  then admitted with gastroenteritis 2 days later (4/8-10).  Better but still does not feel right. She s uncomfortable in the epigastric area. Sense of tightness. Feels like if she could burp she would feel better. Nausea is resolved. diarrhea largely resolv ed.    GI Review of Systems    Reports chest pain, nausea, and  vomiting.      Denies abdominal pain, acid reflux, belching, bloating, dysphagia with liquids, dysphagia with solids, heartburn, loss of appetite, vomiting blood, weight loss, and  weight gain.      Reports diarrhea.     Denies anal fissure, black tarry stools, change in bowel habit, constipation, diverticulosis, fecal incontinence, heme positive stool, hemorrhoids, irritable bowel syndrome, jaundice, light color stool, liver problems, rectal bleeding, and  rectal pain.    Current Medications (verified): 1)  Nexium 40 Mg  Cpdr (Esomeprazole Magnesium) .... Two Times A Day 2)  Lasix 40 Mg  Tabs (Furosemide) .... Once Daily 3)  Synthroid 150 Mcg  Tabs (Levothyroxine Sodium) .... Once Daily 4)  Bayer Childrens Aspirin 81 Mg  Chew (Aspirin) .... Once Daily 5)  Norvasc 2.5 Mg Tabs (Amlodipine Besylate) .Marland Kitchen.. 1 Once Daily 6)  Levbid 0.375 Mg  Tb12 (Hyoscyamine Sulfate) .Marland Kitchen.. 1 By Mouth Two Times A Day--Pt Needs Office Visit 7)  Nasonex 50 Mcg/act Susp (Mometasone Furoate) .... Bid 8)  Zyflo Cr 600 Mg Xr12h-Tab (Zileuton) .... 2 By Mouth Two Times A Day 9)  Metoprolol Tartrate 25 Mg Tabs (Metoprolol Tartrate) .Marland Kitchen.. 1 By Mouth Once Daily 10)  Flonase 50 Mcg/act Susp (Fluticasone Propionate) .... As Needed 11)  Ventolin  Hfa 108 (90 Base) Mcg/act Aers (Albuterol Sulfate) .... As Needed 12)  Ambien 10 Mg Tabs (Zolpidem Tartrate) .Marland Kitchen.. 1 By Mouth At Bedtime 13)  Cymbalta 60 Mg Cpep (Duloxetine Hcl) .Marland Kitchen.. 1 By Mouth Once Daily 14)  Trazodone Hcl 150 Mg Tabs (Trazodone Hcl) .Marland Kitchen.. 1 By Mouth At Bedtime 15)  Saline Nasal Spray 0.65 % Soln (Saline) .... Had Sinus Surgery, Uses Several Times A Day 16)  Hydrocodone-Acetaminophen 5-325 Mg Tabs (Hydrocodone-Acetaminophen) .... Take 1 Tab Every 6 Hours As Needed For Pain  Allergies (verified): 1)  ! Levaquin 2)  ! Lisinopril  Past History:  Past Medical History: Reviewed history from 11/30/2009 and no changes required. DIVERTICULOSIS, COLON  HEMORRHOIDS, INTERNAL OBESITY  HERPES ZOSTER 7/07 ALLERGIC RHINITIS ANAPHYLAXIS HEADACHE, CHRONIC  DEPRESSION  HYPOTHYROIDISM  ASTHMA  HYPERTENSION  LICHEN PLANUS - ORAL IRON-DEFICIENCY ISCHEMIC COLITIS (SUSPECTED) ? DIVERTICULITIS  Past Surgical History: Reviewed history from 11/04/2009 and no changes required. Cataract Extraction Bilateral Hysterectomy Right Foot Surgery for toe deformity Nasal Surgery---Oct 01, 2009  Family History: Reviewed history from 04/29/2008 and no changes required. No FH of Colon Cancer: Family History of Diabetes: Father Family History of Heart Disease: Mother  Social History: Reviewed history from 04/30/2008 and no changes required. 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  Alcohol Use - no Illicit Drug Use - no Patient does not get regular exercise.  Single  Vital Signs:  Patient profile:   60 year old female Height:      65 inches Weight:  285 pounds BMI:     47.60 Pulse rate:   80 / minute Pulse rhythm:   regular BP sitting:   128 / 80  (left arm)  Vitals Entered By: Milford Cage NCMA (February 26, 2010 2:15 PM)  Physical Exam  General:  obese.  NAD Eyes:  anicteric Abdomen:  obese soft and nontender BS+ Neurologic:  Alert and  oriented  x3   Impression & Recommendations:  Problem # 1:  GERD (ICD-530.81) Assessment Comment Only on PPI, her chest sxs do not sound typical for this but ? if related seems unlikely since on two times a day PPI will see if change to Kapidex makes a difference  Problem # 2:  NONSPECIFIC ABN FINDING RAD & OTH EXAM GI TRACT (ICD-793.4) Assessment: New GD 2008 esophagus ok thickening on CT most likely underdistention - no additional work-up at this time  Problem # 3:  CHEST PAIN (ICD-786.50) cardiac and PWe work-up neg vague sxs but severe spells of pain is on Levbid ? tachyphylaxis to Nexium so try Dexilant 60 mg daily some of current sxs culd be related to recentgastroenteritis if not better then likely repeat EGD  Problem # 4:  OBESITY (ICD-278.00) Assessment: Unchanged we reviewed surgical options too drastic has lost before and regained  Patient Instructions: 1)  Please continue current medications.  2)  Call back in 2 weeks with an update (early May) or sooner if worse. 3)  Copy sent to : Sharlet Salina, MD 4)  Try Kapidex samples 1 each day instead of Nexium for the time being 5)  The medication list was reviewed and reconciled.  All changed / newly prescribed medications were explained.  A complete medication list was provided to the patient / caregiver.

## 2011-02-02 LAB — BASIC METABOLIC PANEL
CO2: 31 mEq/L (ref 19–32)
Chloride: 103 mEq/L (ref 96–112)
Chloride: 99 mEq/L (ref 96–112)
Creatinine, Ser: 0.73 mg/dL (ref 0.4–1.2)
GFR calc Af Amer: >60 mL/min (ref >60)
GFR calc Af Amer: >60 mL/min (ref >60)
GFR calc non Af Amer: >60 mL/min (ref >60)
Potassium: 3.7 mEq/L (ref 3.5–5.1)
Sodium: 138 mEq/L (ref 135–145)
Sodium: 140 mEq/L (ref 135–145)

## 2011-02-02 LAB — URINALYSIS, ROUTINE W REFLEX MICROSCOPIC
Glucose, UA: NEGATIVE mg/dL
Glucose, UA: NEGATIVE mg/dL
Hgb urine dipstick: NEGATIVE
Ketones, ur: >80 mg/dL — AB
Nitrite: NEGATIVE
Protein, ur: 30 mg/dL — AB
Urobilinogen, UA: 1 mg/dL (ref 0.0–1.0)
pH: 6 (ref 5.0–8.0)

## 2011-02-02 LAB — CK TOTAL AND CKMB (NOT AT ARMC)
CK, MB: 1.9 ng/mL (ref 0.3–4.0)
CK, MB: 2.3 ng/mL (ref 0.3–4.0)
CK, MB: 2.8 ng/mL (ref 0.3–4.0)
Relative Index: 2.6 — ABNORMAL HIGH (ref 0.0–2.5)
Relative Index: INVALID (ref 0.0–2.5)
Relative Index: INVALID (ref 0.0–2.5)
Total CK: 69 U/L (ref 7–177)
Total CK: 94 U/L (ref 7–177)

## 2011-02-02 LAB — D-DIMER, QUANTITATIVE: D-Dimer, Quant: 5.92 ug/mL-FEU — ABNORMAL HIGH (ref 0.00–0.48)

## 2011-02-02 LAB — COMPREHENSIVE METABOLIC PANEL
Albumin: 3.4 g/dL — ABNORMAL LOW (ref 3.5–5.2)
Alkaline Phosphatase: 82 U/L (ref 39–117)
BUN: 10 mg/dL (ref 6–23)
Calcium: 8.8 mg/dL (ref 8.4–10.5)
Potassium: 3.1 mEq/L — ABNORMAL LOW (ref 3.5–5.1)
Sodium: 137 mEq/L (ref 135–145)
Total Protein: 6.2 g/dL (ref 6.0–8.3)

## 2011-02-02 LAB — URINE MICROSCOPIC-ADD ON

## 2011-02-02 LAB — DIFFERENTIAL
Basophils Relative: 1 % (ref 0–1)
Lymphs Abs: 3.7 10*3/uL (ref 0.7–4.0)
Monocytes Absolute: 0.6 10*3/uL (ref 0.1–1.0)
Monocytes Relative: 5 % (ref 3–12)
Neutro Abs: 7.2 10*3/uL (ref 1.7–7.7)

## 2011-02-02 LAB — CBC
HCT: 44.1 % (ref 36.0–46.0)
Hemoglobin: 12.5 g/dL (ref 12.0–15.0)
MCHC: 33.2 g/dL (ref 30.0–36.0)
MCHC: 33.8 g/dL (ref 30.0–36.0)
MCV: 90.2 fL (ref 78.0–100.0)
Platelets: 284 10*3/uL (ref 150–400)
RBC: 4.17 MIL/uL (ref 3.87–5.11)
RDW: 13.7 % (ref 11.5–15.5)
WBC: 9.1 10*3/uL (ref 4.0–10.5)

## 2011-02-02 LAB — RENAL FUNCTION PANEL
Albumin: 3.4 g/dL — ABNORMAL LOW (ref 3.5–5.2)
BUN: 7 mg/dL (ref 6–23)
Calcium: 8.9 mg/dL (ref 8.4–10.5)
Phosphorus: 3.3 mg/dL (ref 2.3–4.6)
Potassium: 3.6 mEq/L (ref 3.5–5.1)

## 2011-02-02 LAB — TROPONIN I
Troponin I: 0.01 ng/mL (ref 0.00–0.06)
Troponin I: 0.02 ng/mL (ref 0.00–0.06)

## 2011-02-16 LAB — CBC
Hemoglobin: 15.9 g/dL — ABNORMAL HIGH (ref 12.0–15.0)
MCHC: 34.5 g/dL (ref 30.0–36.0)
MCV: 90.3 fL (ref 78.0–100.0)
RBC: 5.1 MIL/uL (ref 3.87–5.11)
RDW: 13.2 % (ref 11.5–15.5)

## 2011-02-16 LAB — BASIC METABOLIC PANEL
CO2: 34 mEq/L — ABNORMAL HIGH (ref 19–32)
Calcium: 9.7 mg/dL (ref 8.4–10.5)
Chloride: 95 mEq/L — ABNORMAL LOW (ref 96–112)
Creatinine, Ser: 0.82 mg/dL (ref 0.4–1.2)
GFR calc Af Amer: >60 mL/min (ref >60)
Glucose, Bld: 155 mg/dL — ABNORMAL HIGH (ref 70–99)
Sodium: 140 mEq/L (ref 135–145)

## 2011-03-29 NOTE — Discharge Summary (Signed)
[name], Tammy             ACCOUNT NO.:  [id number]   MEDICAL RECORD NO.:  [id number]          PATIENT TYPE:  INP   LOCATION:  5159                         FACILITY:  [facility]   PHYSICIAN:  [name] I Elsaid, MD      DATE OF BIRTH:  [birth year]   DATE OF ADMISSION:  04/11/2008  DATE OF DISCHARGE:  04/16/2008                               DISCHARGE SUMMARY   PRIMARY CARE PHYSICIAN:  [name]. [name], MD.   GASTROENTEROLOGIST:  Iva Boop, MD, [facility].   DISCHARGE DIAGNOSES:  1. Left-sided colitis most probably secondary to Crohn's exacerbation.  2. Anemia.  3. Leukocytosis, improved felt to be secondary to #1.  4. Hypothyroidism.  5. Morbid obesity.  6. Hyperkalemia, status post replacement.  7. History of asthma.  8. Gastroesophageal reflux disease.  9. Hypertension.  10.Fatty liver.  11.History of lichen planus.  12.History of benign adenoma of the left adrenal gland.  13.Cholelithiasis without evidence of cholecystitis.   DISCHARGE MEDICATIONS:  1. Cipro 500 mg twice daily for 1 week.  2. Flagyl 250 mg three times daily for 1 week.  3. Prednisone 40 mg daily until you see Dr. [name].  4. Os-Cal 500 mg twice daily.  5. Advair 500/50, one puff twice daily.  6. Trazodone 150 mg nightly.  7. Nexium 40 mg twice daily.  8. Ambien 10 mg at bedtime.  9. Cymbalta 60 mg daily.  10.Lasix 40 mg daily.  11.Synthroid 150 mcg daily.  12.Lisinopril 5 mg daily.  13.Aspirin 81 mg daily.   CONSULTATIONS:  Gastroenterology consulted.   PROCEDURE:  Colonoscopy which showed 30-cm sigmoid severe colitis in the  left colon and splenic flexure and descending colon.  Morphology did  show colonic mucosa fragment associated with fibropurulent exudate and  fibrosis consistent with ulceration.  Focally, the colonic mucosa showed  clipped atrophy with fibrosis in the stroma.  The differential include  ischemic related ulceration, no granuloma are identified.  CT of the  abdomen and pelvis,  inflammatory change involved the splenic flexure,  proximal descending colon, and proximal sigmoid colon.  Differential  included infectious colitis, inflammatory bowel disease, or ischemic  colitis, although there are numerous colonic diverticula and  inflammatory change are more diffuse than typically seen with  diverticulitis.  Cholelithiasis, benign adenoma of the left adrenal  gland.  Fatty infiltration of the liver.   HISTORY OF PRESENT ILLNESS:  Please review the history done by Dr.  Eda Paschal [name].  This is a 60 year old female with history of asthma,  gastroesophageal reflux.  He had a recent colonoscopy by Dr. [name]  where she had 2 cecal ulcer and did show aphthous type ulcer, Crohn  disease was on differential diagnosis.  At that time, the patient was  offered further treatment, but the patient deferred any further  management until she has symptoms.  She came at this time with severe  abdominal pain associated with bloody diarrhea at the hospital nausea  and vomiting.  CT scan showed possibility of inflammatory bowel disease.  Accordingly, the patient started on IV fluid, kept n.p.o. and placed on  Cipro and  Flagyl.  Gastroenterology consulted done by Dr. [name] and  followed by Dr. [name] where recommendation was the patient to undergo a  colonoscopy.  Colonoscopy reports are as above.  Morphology possibility  of ischemic colitis.  Gastroenterology recommend to continue with  prednisone, Cipro, and Flagyl until she sees Dr. [name] at his office.  The patient tolerated regular diet during hospitalization very well.  No  further bloody diarrhea was noticed.  No further vomiting or nausea.  The patient's clinical condition significantly improved.  The patient  accordingly was discharged on Cipro and Flagyl for 1 week and prednisone  40 mg p.o. daily.  She can make an appointment to see Dr. [name] in 2  weeks.  The patient also offered Os-Cal and insulin sliding scale.  An   Accu-Chek machine to check her fingersticks; however, steroid can  increase her CBG.   1. Anemia.  Hemoglobin dropped during hospitalization to 10.8.  No      workup done at this hospital.  The patient needs to follow her H&H      closely with her primary care physician.  2. Hypertension.  The patient resuming her home medication.  3. History of migraine headache, which resolved with Excedrin 1-2 tab      p.o. q.6 h. p.r.n.  We felt that the patient is medically stable to      be discharged home.  Follow with her primary care physician.      Follow with Dr. [name] within 2 weeks to taper off her steroid.      She will also please call Dr. [name] if her fingersticks are above      180.      [name] Bosie Helper, MD  Electronically Signed     HIE/MEDQ  D:  04/15/2008  T:  04/16/2008  Job:  [id number]

## 2011-03-29 NOTE — Consult Note (Signed)
NAME:  [name], [name]             ACCOUNT NO.:  [id number]   MEDICAL RECORD NO.:  [id number]          PATIENT TYPE:  INP   LOCATION:  5159                         FACILITY:  [facility]   PHYSICIAN:  Anselmo Rod, M.D.  DATE OF BIRTH:  16-Apr-1951   DATE OF CONSULTATION:  04/12/2008  DATE OF DISCHARGE:                                 CONSULTATION   GASTROINTESTINAL CONSULTATION   REASON FOR CONSULTATION:  Rectal bleeding, nausea, vomiting, and left  lower quadrant pain.   HISTORY OF PRESENT ILLNESS:  Ms. Winbush is a 60 year old white female  followed by Dr. Stan Head for presumed postinfectious IBS versus IBD.  Patient apparently had a colonoscopy last year and when cecal ulcers  were found.  As per documentation found on E-chart, patient had been  advised by Dr. [name] on December 18, 2006, to have further workup when  the cecal ulcers were biopsied and Crohn's disease was thought to be a  working diagnosis.  A capsule endoscopy versus a CT enterography with a  small bowel follow through were considered and even empiric treatment  for Crohn's disease was advised, but patient chose to observe her  symptoms and to call back as needed.  She was in her usual state of  health until yesterday when she developed some nausea, vomiting, and  severe left lower quadrant pain prompting her to come to the emergency  room.  She was evaluated with a CT scan of the abdomen and pelvis that  revealed pericolonic stranding on the left side, cholelithiasis, and a  left adrenal adenoma.  The patient was admitted and started on IV Flagyl  when she was found to have an elevated white count.  Patient denies  having any rectal bleeding since her last procedure.  She did notice  some rectal bleeding earlier this afternoon.  Stool studies were ordered  by the [facility], but the stool has not been collected yet.  The patient denies any nausea, vomiting, fever, chills, or rigors at  this time.   Her appetite is fair.  She has no history of abnormal weight  loss or weight gain.  There is a family history of Crohn's disease in a  niece.  The patient denies any ocular or dermatologic complaints at this  time.   ALLERGIES:  No known drug allergies.   MEDICATIONS AT HOME:  Nexium, Lasix, Cymbalta, Synthroid, Lisinopril,  aspirin, Zyflo, Advair Discus, and allergy shots, which she gets every  month under the care of Dr. Lucie Leather.   PAST MEDICAL HISTORY:  1. Asthma and allergies.  2. Hypertension.  3. Morbid obesity.  4. Lichen planus of the mouth.  5. Status post hysterectomy for uterine fibroids.  6. Bilateral cataract surgery.  7. Right foot surgery.  8. Degenerative osteoarthritis in her left knee.  9. Diffuse osteopenia.   SOCIAL HISTORY:  [place]  She is  single.  She denies the use of street drugs.  She quit smoking about 20  years ago.  She drinks alcohol on social occasions.   FAMILY HISTORY:  There are several family  members with psoriasis.  She  has a niece with Crohn's disease for over 30 years.  There is no family  history of breast, ovarian, uterine, or endometrial cancer.  Her father  had COPD.  Her mother died of a stroke following several TIAs.   REVIEW OF SYSTEMS:  1. Left lower quadrant pain.  2. Nausea and vomiting yesterday, which has now resolved.  3. Some rectal bleeding earlier this afternoon.  4. No history of fever, chills, or rigors.   GENERAL PHYSICAL EXAMINATION:  GENERAL:  A very pleasant, cooperative,  middle-aged, morbidly obese, white female in no acute distress lying  comfortably in bed with stable vital signs.  VITAL SIGNS:  Temperature of 97.8, blood pressure 146/96, pulse 100 per  minute, respiratory rate 20 per minute.  The patient is alert and  oriented x3 in no acute distress.  NECK:  Supple.  CHEST:  Clear to auscultation, S1 and S2 regular, no murmur, rub, or  gallop.  LUNGS:  No rhonchi or  wheezing.  ABDOMEN:  Soft, obese, with left lower quadrant tenderness on palpation  with guarding, no rebound or rigidity, no hepatosplenomegaly  appreciated.  RECTAL EXAMINATION:  Deferred.   LABORATORY EVALUATION:  Revealed a white count of 19.6 K on admission  with a hemoglobin of 12.6 and MCV of 77.9, 86% neutrophils.  Chem-8 was  normal except for a glucose of 157.  CBC done today revealed a white  count of 16.2 with a hemoglobin 11.2 and platelets of 308,000.  PT 13.5,  INR 1.0, PTT 36.  CMET was normal except for a potassium of 3.4, glucose  163, albumin 3, magnesium 1.9, phosphorus 3.2.  Urine drug screen was  negative.  TSH was normal at 1.787.   ASSESSMENT/PLAN:  1. Acute colitis with terminal ileum wall thickening.  Patient is      presently on Flagyl.  Plans are to do stool studies before planning      a colonoscopy with intubation of the terminal ileum.  Continue      serial CBCs and follow.  2. Cholelithiasis and fatty liver without evidence of cholecystitis.  3. Morbid obesity.  4. Hypothyroidism on Synthroid.  5. Asthma and allergies.  6. Lichen planus of the mouth.  7. Adrenal adenoma on the left side.  8. Degenerative osteoarthritis of the left knee with diffuse      osteopenia.  9. Hypertension on Lisinopril.   PLANS:  As above.  Further recommendations when in followup.      Anselmo Rod, M.D.  Electronically Signed     JNM/MEDQ  D:  04/12/2008  T:  04/12/2008  Job:  [id number]   cc:   Iva Boop, MD,FACG  [name]. [name], M.D.  Jessica Priest, M.D.

## 2011-03-29 NOTE — Discharge Summary (Signed)
NAME:  [name], [name]             ACCOUNT NO.:  [id number]   MEDICAL RECORD NO.:  [id number]          PATIENT TYPE:  OBV   LOCATION:  5524                         FACILITY:  [facility]   PHYSICIAN:  Eduard Clos, MDDATE OF BIRTH:  12-17-1950   DATE OF ADMISSION:  06/27/2008  DATE OF DISCHARGE:  06/28/2008                               DISCHARGE SUMMARY   COURSE IN THE HOSPITAL:  A 60 year old female with known history of  hypertension, hypothyroidism, history of bronchial asthma, depression,  insomnia, and history of allergic reaction to beestings, presented to  the ER after the patient had developed tongue swelling, facial swelling,  itching, and erythema which was acute.  The patient was given some  Benadryl in the ER along with some steroids after which the patient's  symptoms got largely relieved.  The patient was further admitted for  observation.  During this stay, the patient's symptoms have improved  significantly.  At this time, the patient is eager to go home.  The  patient was on lisinopril, which was discontinued and the patient was  taking Levaquin for her upper respiratory infection, which also was  discontinued and was advised not to take lisinopril, Levaquin, and the  hydrocodone.  For her blood pressure control, Norvasc was added.  The  patient already was on a steroid taper, which is to be continued.  At  this time as the patient's symptoms have largely resolved, the patient  is discharged home, and is advised to follow with her primary care, Dr.  [name], within a week's time and her allergist, which she usually  follows.  The patient already has EpiPen, which she carries in her  purse.   FINAL DIAGNOSES:  1. Anaphylactic reaction.  2. Hypertension.  3. Hypothyroidism.  4. History of bronchial asthma.  5. History of depression.  6. History of insomnia.   MEDICATIONS AT DISCHARGE:  1. Cymbalta 60 mg p.o. daily.  2. Lasix 40 mg p.o. daily.  3. Nexium 40 mg  p.o. daily.  4. Synthroid 175 mcg p.o. daily.  5. Aspirin 81 mg p.o. daily.  6. Seroquel 600 mg twice a day.  7. Advair Diskus 500/50 one puff twice a day.  8. Ambien 10 mg p.o. at nighttime.  9. Trazodone 150 mg p.o. daily.  10.Prednisone 20 mg p.o. daily for 2 days, followed by 10 mg p.o.      daily for 2 days, and stop.  11.Norvasc 5 mg p.o. daily.   PLAN:  The patient advised to follow with her primary care physician  within a week's time and also to follow with her allergist within a  week's time.  The patient advised to stop taking lisinopril, Levaquin,  and hydrocodone, and to avoid its further use.  The patient already has  EpiPen and to use it as early as possible for any signs or symptoms of  allergic reaction, to be on cardiac healthy diet.      Eduard Clos, MD  Electronically Signed     ANK/MEDQ  D:  06/28/2008  T:  06/28/2008  Job:  [id number]

## 2011-03-29 NOTE — H&P (Signed)
NAMEHAYLEN, [name]             ACCOUNT NO.:  [id number]   MEDICAL RECORD NO.:  [id number]          PATIENT TYPE:  INP   LOCATION:  5159                         FACILITY:  [facility]   PHYSICIAN:  Hind I Elsaid, MD      DATE OF BIRTH:  [birth year]   DATE OF ADMISSION:  04/11/2008  DATE OF DISCHARGE:                              HISTORY & PHYSICAL   PRIMARY CARE PHYSICIAN:  [name]. [name], MD, gastroenterologist and Iva Boop, MD, Clementeen Graham.   CHIEF COMPLAINT:  Abdominal pain and diarrhea.   HISTORY OF PRESENT ILLNESS:  This is a 60 year old white female with a  history of asthma, gastroesophageal reflux disease.  Had recent  colonoscopy in 2008 done by Dr. [name] from [facility],  where apparently the patient had 2 cecal ulcers and that show aphthous  type changes and Crohn disease was on the differential diagnosis.  At  that time, the patient did not offer any treatment, and according to the  patient, she was asked to follow within 10 years unless she continued to  complain of symptoms.  The patient apparently was in her general good  health until yesterday night when she suddenly started to have severe  nausea associated with vomiting.  The patient denies any bloody vomitus  ,mainly _was yellow to greenish in nature.  Condition also associated  with diarrhea.  The patient denies any bloody diarrhea.  Diarrhea  uncountable.  Condition then was also associated with left lower  abdominal pain, not radiating.  Pain is 9/10.  The patient denies any  contact with sick people.  Denies any fever.  Denies eating any old  food.  The patient denies any bloody diarrhea.  As per the patient,  nausea still going on, vomiting is still about around 5 a.m. with the  diarrhea, but she still complained of severe abdominal pain.  The  patient denies any chest pain.  Denies any shortness of breath.  Denies  nausea or vomiting.  Denies any burning micturition.  Denies any lower  extremity weakness or numbness.  Denies any back pain.   PAST MEDICAL HISTORY:  1. History of 2 cecal ulcers status post colonoscopy in 2008.  2. Asthma.  3. Gastroesophageal reflux disease.  4. Hypertension.  5. Hypothyroidism.  6. Migraine headaches.  7. History of chronic ascites.  8. Status post knee meniscal tear.  9. Status post arthroscopy.  10.Hysterectomy.  11.C-section.  12.History of foot surgery.  13.Cataract surgery.   ALLERGIES:  No known drug allergies.   MEDICATIONS:  1. Nexium 40 mg twice daily.  2. Lasix 40 mg daily.  3. Synthroid 150 mcg daily.  4. Lisinopril 5 mg daily.  5. Aspirin 81 mg daily.  6. Zyflo 2 tablets twice daily, 600 mg.  7. Advair Diskus 500/50.  8. Cymbalta.  9. Ambien 10 mg.  10.Trazodone, dose unknown.  11.Albuterol MDI 1 puff p.r.n.   FAMILY HISTORY:  Father died at the age of 50, he had diabetes and COPD.  Mother history of cardiac valve repair, congestive heart failure, and  thyroidectomy.  Sister with a  history of psoriasis.  The patient  admitted also they had a history of Crohn disease.   SOCIAL HISTORY:  The patient lives with her mother.  Has only 1 son.  She is independent of activity.  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  No history of drug abuse.  She drinks alcohol  occasionally and denies any IV drug abuse or smoking.   REVIEW OF SYSTEMS:  As per HPI.   PHYSICAL EXAMINATION:  VITAL SIGNS:  Temperature 97, blood pressure  164/92, pulse rate 98, respiratory rate 18, and saturation 98% on room  air.  HEENT:  Normocephalic and atraumatic.  Pupils are symmetrical.  Evidence  of artificial implanted lenses.  NECK:  No JVD.  No goiter.  No lymphadenopathy.  RESPIRATORY:  Clear to auscultation bilaterally.  HEART:  S1 and S2 with no other sound.  ABDOMEN:  Obese, mild tenderness at the left lower quadrant.  No  organomegaly.  No masses and bowels sounds are present.  EXTREMITIES:  No lower limb  edema, peripheral pulses intact.  CNS:  The patient alert and oriented x3 with no focal neurological  findings.  SKIN:  Without any rashes.   LABORATORY DATA:  Urinalysis, which showed white blood cells 0-2 and  rbc's 0-2.  Specific gravity of 1.025 and pH of 5.5, and ketones are 15.  CBC 19.6, hemoglobin 12.6, hematocrit 38.1, and platelets 351.  Sodium  137, potassium 4.2, chloride 101, glucose 157, BUN 15, and creatinine  1.9.  CT of abdomen and pelvis, inflammatory change involving the  splenic flexure, proximal descending colon, and proximal sigmoid colon.  Deferential include infectious colitis, inflammatory bowel disease, or  ischemic colitis.  Inflammatory changes are likely on the colon and on  the diverticula.  There is questionable focal wall thickening of the  short segment of the distal ileum.  Question if the patient has any  known history of inflammatory bowel disease to explain the finding.   ASSESSMENT AND PLAN:  1. This is a 61 year old female, admitted with abdominal pain, nausea,      vomiting, and diarrhea.  Found to have CT scan finding, which      worried for inflammatory bowel disease rather than an infectious      colitis with the finding of ileitis.  We will keep the patient      n.p.o., start the patient on IV fluids, and place the patient on      the Cipro and Flagyl.  The patient has already workup done by Dr.      [name] from [facility], include colonoscopy and      possibility of Crohn disease.  We will ask Dr. [name] to evaluate      the patient, if the patient will benefit from any anti-inflammatory      medications like steroids.  We will get stool for C. diff, white      blood cells,and ova and parasite.  2. Leukocytosis, most probably secondary to #1.  3. Hypothyroidism.  We will continue with the patient home medication.  4. Hypertension.  We will place the patient on labetalol IV q.6 h.      p.r.n.  5. Deep vein thrombosis and  gastrointestinal prophylaxis.  Followup      recommendation to be adjusted as hospital course progresses.      Hind Bosie Helper, MD  Electronically Signed     HIE/MEDQ  D:  04/11/2008  T:  04/12/2008  Job:  [id number] 

## 2011-03-29 NOTE — H&P (Signed)
NAME:  [name], [name]             ACCOUNT NO.:  [id number]   MEDICAL RECORD NO.:  [id number]          PATIENT TYPE:  OBV   LOCATION:  5524                         FACILITY:  [facility]   PHYSICIAN:  Renee Ramus, MD       DATE OF BIRTH:  06/29/1951   DATE OF ADMISSION:  06/27/2008  DATE OF DISCHARGE:                              HISTORY & PHYSICAL   HISTORY OF PRESENT ILLNESS:  The patient is a 60 year old female who  recently began a course of prednisone and Levaquin secondary to  sinusitis who developed acute tongue and facial swelling as well as  swollen hands, itching, and erythema.  The patient was on day 4 of  Levaquin.  She was also taking lisinopril, which she has taken for  several years, and was on 50 mg a day of prednisone on a 12-day  prednisone dose pack.  The patient has no previous history of  anaphylaxis, although she does have allergies to bee stings and has  seasonal allergies.  She does receive allergy shots and does have an  EpiPen.  The patient's symptoms remarkably decreased while in the  emergency department.  She did receive IV Benadryl, and this has had a  good effect.  The patient was also on cough syrup which contained  codeine.  The patient has no known or documented drug allergies, and I  am unsure if this represents acute anaphylaxis versus a drug  hypersensitivity reaction.  The patient has been admitted for  observation and further evaluation and treatment.   PAST MEDICAL HISTORY:  1. Gastroesophageal reflux disease.  2. Hypertension.  3. Depression.  4. Hypothyroid.  5. Asthma.  6. Sinusitis.  7. History of diagnosis of Crohn disease, specifically history of left-      sided colitis, which is currently undergoing investigation.  8. Insomnia.  9. Morbid obesity.  10.Seasonal allergies.   MEDICATIONS:  1. Nexium 40 mg 1 p.o. b.i.d.  2. Lasix 40 mg p.o. daily.  3. Cymbalta 60 mg p.o. daily.  4. Synthroid 175 mcg p.o. daily.  5. Lisinopril 25 mg  p.o. daily.  6. Aspirin 81 mg p.o. daily.  7. Zyflo 600 mg 2 tablets p.o. b.i.d.  8. Trazodone 150 mg p.o. nightly.  9. Ambien 10 mg p.o. nightly.  10.Prednisone taper at 40 mg p.o. daily.   ALLERGIES:  NKDA.   SOCIAL HISTORY:  The patient denies alcohol or tobacco use.  She works  at [employer].  She is single.  She lives alone.   FAMILY HISTORY:  Not available.   REVIEW OF SYSTEMS:  All other comprehensive review of systems are  negative.   PHYSICAL EXAMINATION:  GENERAL:  This is a morbidly obese white female,  currently in no apparent distress.  She has no use of accessory muscles  of respiration and appears calm and comfortable.  VITAL SIGNS:  Temperature 97.2, heart rate 121, blood pressure 156/83,  and respiratory rate 20.  HEENT:  No jugular venous distention or lymphadenopathy.  Oropharynx is  clear.  Mucous membranes are pink and moist.  TMs are clear bilaterally.  Pupils equal and reactive to light and accommodation.  Extraocular  muscles are intact.  The patient has no signs of tongue swelling.  She  has no stridor.  CARDIOVASCULAR:  Regular rate and rhythm without murmurs, rubs, or  gallops.  PULMONARY:  Lungs are clear to auscultation bilaterally, with no  evidence of wheezes and no evidence of focal consolidation.  ABDOMEN:  Morbidly obese, nontender, and nondistended without  hepatosplenomegaly.  Bowel sounds are present.  She has no rebound or  guarding.  EXTREMITIES:  She has no clubbing or cyanosis.  She does have somewhat  edematous hands that are also reddened and itchy, she is visibly  attempting to scratch them, but no specific evidence of hives.  NEURO:  Cranial nerves II through XII are grossly intact.  She has no  focal neurological deficit.  She is conscious, alert, and oriented to  person, place, and time.  Currently, in no apparent distress.   LABORATORIES:  White count 15.3, H&H 13 and 42, MCV 80, and platelets  374.  Sodium 139, potassium 3.5,  chloride 99, bicarb 29, BUN 19,  creatinine 1.0, glucose 118, calcium 8.8, albumin 3.3, AST 17, ALT 17,  and alk phos 85.   STUDIES:  Chest x-ray shows no acute disease.   ASSESSMENT AND PLAN:  1. Anaphylaxis versus hypersensitivity reaction.  All of her symptoms      are currently subsiding.  We will continue treating her with      Benadryl q.8 x3.  We will continue prednisone.  We will give IV      fluids.  We will discontinue Levaquin and lisinopril at this time.      If a drug is a culprit, I am unsure which specific drug it could      be.  Possibilities include Levaquin, lisinopril, or the      hydrocodone.  This may be better addressed as an outpatient.  If      she was seeing an allergist, she could be specifically tested for      these compounds.  2. Hypertension, currently stable.  Continue with Lasix and treat with      additional agents if needed since we are discontinuing lisinopril.  3. Gastroesophageal reflux disease.  Continue proton-pump inhibitor .  4. Obesity.  Check TSH and free T4.  5. Hypothyroid, as above.  6. Asthma.  Continue Advair and Zyflo.  The patient currently has no      signs of asthma exacerbation.  7. Sinusitis, currently stable.  The patient has no evidence of acute      sinusitis currently.  8. Crohn disease, currently stable.  The patient has no evidence of      diarrhea or active colitis.  9. Depression.  We will continue Cymbalta.  10.Insomnia.  Continue trazodone and Ambien.   DISPOSITION:  The patient will be admitted for observation, likely  discharge in a.m. if stable.  H&P was constructed by reviewing past  medical history, conferring with the emergency room physician, and  reviewing the emergency medical record, time spent 1 hour.      Renee Ramus, MD  Electronically Signed     JF/MEDQ  D:  06/27/2008  T:  06/28/2008  Job:  [id number]   cc:   [name]. [name], M.D.

## 2011-04-01 NOTE — Op Note (Signed)
[name], [name]             ACCOUNT NO.:  [id number]   MEDICAL RECORD NO.:  [id number]          PATIENT TYPE:  AMB   LOCATION:  DSC                          FACILITY:  [facility]   PHYSICIAN:  Robert A. Thurston Hole, M.D. DATE OF BIRTH:  11/14/1951   DATE OF PROCEDURE:  04/17/2007  DATE OF DISCHARGE:                               OPERATIVE REPORT   PREOPERATIVE DIAGNOSIS:  Left knee medial and lateral meniscal tears  with chondromalacia and synovitis.   POSTOPERATIVE DIAGNOSIS:  Left knee medial and lateral meniscal tears  with chondromalacia and synovitis.   PROCEDURE:  1. Left knee EUA followed by arthroscopic partial medial and lateral      meniscectomies.  2. Left knee chondroplasty with partial synovectomy.   SURGEON:  Elana Alm. Thurston Hole, M.D.   ASSISTANT:  None.   ANESTHESIA:  General.   OPERATIVE FINDINGS:  30 minutes.   COMPLICATIONS:  None.   INDICATIONS FOR PROCEDURE:  [name] is a 55-year woman who has had  4 to 5 months of increasing left knee pain with  exam and MRI  documenting meniscal tearing with chondromalacia and synovitis.  She has  failed conservative care is now to undergo arthroscopy.   DESCRIPTION OF PROCEDURE:  [name] was brought to operating room on  April 17, 2007, placed on the operative table in supine position.  After  being placed under general anesthesia,  her left knee was examined.  Range of motion 0 to 125 degrees, 1+ to 2+ crepitation knee.  Stable  ligamentous exam with normal patellar tracking.  The knee was sterilely  injected with 0.25% Marcaine with epinephrine.  Left leg was then  prepped using sterile DuraPrep and draped using sterile technique.  Originally through an anterolateral portal the arthroscope with a pump  attached was placed into an anteromedial portal and arthroscopic probe  was placed.  On initial inspection of the medial compartment the  articular cartilage showed 75% grade 3 chondromalacia which was  debrided, medial meniscus tear, posterior medial horn of which 50% was  resected back to stable rim.  Intercondylar notch inspected.  Anterior  and posterior cruciate ligaments were normal.  Lateral compartment  inspected with 25% grade 3 chondromalacia which was debrided.  Lateral  meniscus showed tear of the posterolateral corner 25% which was resected  back to stable rim.  Patellofemoral joint showed 75% grade 3  chondromalacia on the patella and femoral groove and this was debrided.  The patella tracked normally.  Moderate synovitis in medial lateral  gutters were debrided.  Otherwise they are free of pathology.  After  this done, it was felt that all pathology been satisfactorily addressed.  The instruments were removed.  Portals closed with 3-0 nylon suture and  injected with 0.25% Marcaine with epinephrine and 4 mg morphine.  Sterile dressings applied.  The patient awakened and taken to recovery  in stable condition.   FOLLOW UP:  This included being followed as an outpatient on Vicodin and  Mobic.  See me back in the office in a week for sutures out and  followup.  Robert A. Thurston Hole, M.D.  Electronically Signed     RAW/MEDQ  D:  04/17/2007  T:  04/17/2007  Job:  [id number]

## 2011-04-01 NOTE — Op Note (Signed)
[name], ARTZ             ACCOUNT NO.:  [id number]   MEDICAL RECORD NO.:  [id number]          PATIENT TYPE:  AMB   LOCATION:  SDS                          FACILITY:  [facility]   PHYSICIAN:  Robert L. Dione Booze, M.D.  DATE OF BIRTH:  Oct 28, 1951   DATE OF PROCEDURE:  09/15/2005  DATE OF DISCHARGE:                                 OPERATIVE REPORT   INDICATIONS AND JUSTIFICATIONS FOR THE PROCEDURE:  Mylah Baynes has been  followed in my office since the year 2004 and has gradually developed  significant cataractous lens changes.  She was seen on July 13, 2005 with  best corrected vision 20/80 right eye and 20/50 left.  Pressure was 15 in  each.  The pupils, motility, lids, conjunctiva, cornea, anterior chamber and  fundus exams were unremarkable and the slit lamp showed nuclear and cortical  cataractous lens changes.  She did have an uncomplicated right cataractous  extraction with lens implant performed on August 18, 2005 and has done  nicely and decided to have her opposite left cataract removed, again in  order to obtain better vision.  Medically, she should be stable for this.   JUSTIFICATION FOR OUTPATIENT SETTING:  Routine.   JUSTIFICATION FOR OVERNIGHT STAY:  None.   PREOPERATIVE DIAGNOSIS:  Cortical and nuclear cataract, right eye.   POSTOPERATIVE DIAGNOSIS:  Cortical and nuclear cataract, right eye.   OPERATION PERFORMED:  Kelman phacoemulsification and cataract extraction of  right eye with lens implant.   SURGEON:  Robert L. Dione Booze, M.D.   ANESTHESIA:  Nurse anesthetist standby with intravenous sedation and topical  anesthesia.   DESCRIPTION OF PROCEDURE:  The patient arrived in the operating room and was  prepped and draped in the routine fashion.  A lid speculum was positioned.  A temporal 3 mm incision was made with a keratome and a Super Blade was used  to enter superonasally. The scope was used in the anterior chamber.  The  capsulorrhexis was performed with  a bent needle and capsulorrhexis forceps.  Hydrodissection was performed and the nucleus was removed with the  phacoemulsification unit.  The cortical material was removed by aspiration.  More viscoelastic material was put into the anterior chamber and the  foldable lens implant was placed into the eye through the wound and centered  nicely.  The viscoelastic material was removed with the irrigation  aspiration cannula.  Both wounds were hydrated and the anterior chamber was  reformed and Miochol was used.  Topical Zymar drops were used.  The eye was  covered with a shield.  The patient then left the operating room having done  nicely.   FOLLOW UP:  Patient is to be seen in my office the next day to have the eye  examined and to be given further instructions.           ______________________________  Doris Cheadle Dione Booze, M.D.     RLG/MEDQ  D:  09/15/2005  T:  09/16/2005  Job:  [id number]   cc:   [name]. [name], M.D.  Fax: [phone]

## 2011-04-01 NOTE — H&P (Signed)
NAMEGERRI, [name]             ACCOUNT NO.:  [id number]   MEDICAL RECORD NO.:  [id number]          PATIENT TYPE:  INP   LOCATION:  1825                         FACILITY:  [facility]   PHYSICIAN:  [name], MD     DATE OF BIRTH:  [birth year]   DATE OF ADMISSION:  10/29/2006  DATE OF DISCHARGE:                              HISTORY & PHYSICAL   PRIMARY CARE PHYSICIAN:  Dr. [name].   CHIEF COMPLAINT:  Syncope.   HISTORY OF PRESENT ILLNESS:  [name] is a pleasant 60 year old  Caucasian female patient with past medical history as indicated below.  She was in her usual state of health until this morning.  On waking up  this morning, the patient with complaints of feeling unwell, generalized  weakness and nausea.  Apart from drinking a bottle of 7-Up, the patient  did not eat or drink anything until 2:30 in the afternoon.  The patient  was out shopping with her family.  At about 2:30 p.m., while at [facility] for a meal, the patient started feeling nauseous again.  She stood  up to go to the bathroom when she felt her eyes going dark but she  denied any lightheadedness, dizziness, chest pain, palpitations.  She  proceeded to go to the bathroom where she remembers falling down and  sitting on the floor but then does not remember anything subsequently  for the next approximately five minutes. She was found lying on the  floor in the bathroom.  The patient also vomited x1 the piece of bread  that she had consumed while at [employer].  EMS was called and the  patient was transported to the emergency room.  Per the EMS transfer  note, the patient was noted to have blood pressure between 88 and 92  systolic, pulse between 68 and 72 and had fingerstick of 133.  The  patient on waking up on route here was feeling lethargic but denied any  headache, asymmetrical limb weakness, mouth twisting or slurred speech.  On arriving in the emergency room, the patient continued to feel  lethargic and generally weak with nausea.  She has vomited about four  times in all since this afternoon consisting of yellow-colored liquid  with no coffee-ground or blood.  The patient denies any abdominal pain.  She has had four episodes of diarrhea, soft brown stools with no mucus  or blood.  The patient says that she was babysitting for a child with  diarrhea yesterday.  The patient denies any fever, chills or rigors but  does say that she has been cold in the emergency room.  The patient  received IV fluids in the emergency room and some anti-nausea  medications following which she says she feels much stronger than  before.   PAST MEDICAL HISTORY:  1. Asthma.  2. Hypertension.  3. Hypothyroidism.  4. Thyroid nodule status post biopsy, said to benign.  5. Chronic sinusitis.  6. History of shingles.  7. History of lichen planus in the oral cavity.   PAST SURGICAL HISTORY:  1. History of hysterectomy for fibroid uterus.  2. Right foot surgery for deformity of the toes.  3. Bilateral cataract surgery.   MEDICATIONS HISTORY:  1. Nexium.  2. Hydrochlorothiazide 25 mg p.o. daily.  3. Effexor.  4. Advair 250/50 one puff b.i.d.  5. Albuterol.  6. Flonase inhaler.  7. Synthroid 150 mcg p.o. daily.   DRUG ALLERGIES:  There is no known drug allergies.   FAMILY HISTORY:  The patient's father died at the age of 64s year.  He  had diabetes, COPD.  The patient's grandmother with history of diabetes.  The patient's mother with history of strokes/multiple TIAs with history  of cardiac valve repair and thyroidectomy.  The patient's sister with  history of psoriasis.  The patient's son with skin problems.   SOCIAL HISTORY:  The patient lives at her house, is independent of  activities of daily living.  She quit smoking 20 years ago.  Social  alcohol intake.  No history of drug abuse.   REVIEW OF SYSTEMS:  HEENT:  The patient denies any headache, earache,  sore throat, visual  symptoms.  RESPIRATORY SYSTEM:  The patient denies  any cough, dyspnea, wheezing, chest tightness.  CARDIOVASCULAR:  The  patient denies chest pain, palpitations, orthopnea, PND. ABDOMINAL/GI:  Per the history of present illness.  GU:  The patient with history of  dysuria but no frequency, urgency.  CNS:  Per history of present  illness.  EXTREMITIES:  The patient with pain in the right leg lateral  aspect following fall.  She might have twisted the foot after having  collapsed on her leg when she fell.  SKIN:  Without any rashes.  PSYCHIATRIC:  No delusions, hallucinations nor suicidal or homicidal  ideations.  GENERAL:  There is no fever or chills, rigors. Feeling cold.   PHYSICAL EXAMINATION:  The patient is a 60 year old moderately but obese  female in no cardiopulmonary or painful distress.  Temperature at this  time is 97 degrees Fahrenheit.  Pulse is 87 per minute and regular,  blood pressure 129/57, respiration 20 per minute, saturating at 98% on  room air.  HEENT:  Head is normocephalic and atraumatic.  Pupils are symmetrical, 3  mm, round and reacting to light and accommodation.  Extraocular muscle  movements are intact.  Mucus is pink and moist.  Anicteric.  NECK EXAMINATION:  No JVD, goiter, lymphadenopathy or carotid bruit.  RESPIRATORY SYSTEM:  Clear to auscultation bilaterally.  CARDIOVASCULAR SYSTEM:  First and second heart sounds heard.  No third  or fourth heart sounds.  No murmurs, rubs, gallops or clicks.  ABDOMINAL EXAMINATION:  Obese and nontender.  No organomegaly or mass.  Bowel sounds are preserved.  CENTRAL NERVOUS SYSTEM:  The patient is awake, alert and oriented.  No  cranial nerve deficit or focal deficits.  EXTREMITIES:  The patient with 1+ pitting edema bilateral lower  extremities.  Peripheral pulses are symmetric and well felt.  There is  no clubbing or cyanosis.  There is no focal deficits.  On the right leg, lateral aspect with mild swelling,  tenderness and warmth.  SKIN:  Without any rashes.   LABORATORY DATA:  His CBC:  Hemoglobin 13.3, hematocrit 39.4, white  blood cell of 16.7 with polymorphs of 88, platelet count of 330.  Basic  metabolic panel with sodium of 811, potassium of 3.5, chloride of 96,  bicarb of 30, glucose of 130, BUN of 14, creatinine 1.1.  Hepatic panel  is unremarkable except for an albumin of 3.4.  Point of care  cardiac  markers x1 negative.  Urinalysis cloudy with trace leukocytes, 3-6 white  blood cells per high powered field and rare bacteria.  The patient's EKG  normal sinus rhythm at 67 beats per minute.   Radiology:  CT of the head with no acute intracranial findings.  Chronic  sinusitis seems to be improved compared to the previous CT of the head.  X-ray of the right leg with no acute findings.   ASSESSMENT/PLAN:  1. Syncope:  The etiology of which seems to be secondary to a poor      p.o. intake secondary to ?urinary tract infection and acute      gastroenteritis with associated volume depletion.  We will admit      the patient to telemetry.  We will hold the patient's      hydrochlorothiazide and we will place the patient on intravenous      fluids and will maintain fall precautions.  2. Urinary tract infection:  We will obtain urine for culture      sensitivity.  We will place the patient on intravenous      ciprofloxacin.  3. Acute gastroenteritis:  We will send off stools for culture.  We      will intravenously hydrate the patient and provide p.o. anti-nausea      medications as well as diet as tolerated.  4. Hypertension:  The patient had low blood pressures initially and      normal blood pressures at this time.  We will hold antihypertensive      medications secondary to her volume depletion.  We will      intravenously hydrate the patient and monitor the patient closely.  5. Hypothyroidism:  We will check serum TSH levels.  We will continue      the patient's Synthroid.  6. Asthma  which is stable at this time.  We will continue the      patient's Advair and place the patient on nebulizations p.r.n.  7. For deep vein thrombosis prophylaxis, we will place the patient on      Lovenox.  8. Gastrointestinal prophylaxis:  We will place the patient on      Protonix.  9. Pain of the right leg secondary to a fall:  The patient to be      placed on analgesics.      [name], MD  Electronically Signed     AH/MEDQ  D:  10/30/2006  T:  10/30/2006  Job:  [id number]   cc:   [name]. [name], M.D.

## 2011-04-01 NOTE — Discharge Summary (Signed)
NAMECLYDE, [name]             ACCOUNT NO.:  [id number]   MEDICAL RECORD NO.:  [id number]          PATIENT TYPE:  INP   LOCATION:  4702                         FACILITY:  [facility]   PHYSICIAN:  Isidor Holts, M.D.  DATE OF BIRTH:  [birth year]   DATE OF ADMISSION:  10/29/2006  DATE OF DISCHARGE:  10/31/2006                               DISCHARGE SUMMARY   PRIMARY MEDICAL DOCTOR:  [name]. [name], M.D.   DISCHARGE DIAGNOSES:  1. Acute gastroenteritis, likely viral in etiology.  2. Orthostasis/syncope, secondary to #1 above and volume depletion.  3. Hypertension.  4. Right ankle sprain.  5. History of bronchial asthma.  6. History of hypothyroidism.  7. History of chronic sinusitis.   DISCHARGE MEDICATIONS:  1. Lisinopril 5 mg p.o. daily.  2. Synthroid 150 mcg p.o. daily.  3. Aspirin 81 mg p.o. daily.  4. Nexium 40 mg p.o. daily.  5. Ambien 10 mg p.o. p.r.n. every night/  6. Effexor XR 150 mg p.o. daily.  7. Flonase nasal spray 1 spray each nostril daily.  8. Albuterol MDI 1 puff p.r.n. q.4-6h.  9. Advair Diskus (250/50) one puff b.i.d.  10.Darvocet-N 100 one p.o. p.r.n. q.6h. for pain; a total of 42 pills      have been dispensed.   Note:  Hydrochlorothiazide has been discontinued secondary to  dehydration and orthostasis, until reviewed by primary M.D., Dr. [name].  [place].   PROCEDURE:  1. X-ray, right lower extremity, dated October 29, 2006:  This showed      no acute bony or joint abnormality. Dorsal and plantar calcaneal      spurring are noted, and enthesopathic change about the patella is      also seen. There was no acute finding.  2. Head CT scan dated October 29, 2006:  This showed no acute      intracranial abnormality. Sinus disease appears to have improved      since sinus CT scan of January 2007.   CONSULTATIONS:  None.   ADMISSION HISTORY:  As in HPI note of October 29, 2006 dictated by Dr.  Marcellus [name]. However, in brief, this is a 60 year old  female, with  known history of bronchial asthma, hypertension, dysthyroidism, chronic  sinusitis, remote history of shingles, history of oral lichen planus,  who presents following a syncopal episode which occurred following a  meal at about 2:30 p.m. at [facility] after not having eaten anything  all day. She had felt nonspecifically unwell prior, and passed out while  in the bathroom. Was subsequently found lying on the floor of the  bathroom. She appears to have injured her left ankle at that time. She  vomited x1 initially, but subsequently on arrival in the emergency  department, she vomited a few more times and had episodes of diarrhea.  On initial evaluation by EMS, the patient was found to have blood  pressure between 88 and 92 systolic. While in the emergency room at  [facility], BP was found to be 129/57. The patient was admitted  for further evaluation, investigation and management.   CLINICAL COURSE:  1. Acute gastroenteritis. The patient presents with recurrent vomiting      and diarrhea after having felt nonspecifically unwell. She was      managed with bowel rest, IV fluid hydration, antiemetics, and      protein-pump inhibitor treatment, with satisfactory effect. By the      a.m. of October 30, 2006, she had become completely asymptomatic      and remained so. We were thus able to advance her diet without any      deleterious effects. Stool studies were requested. However, during      the course of her hospitalization, the patient produced no stools.      It is likely that etiology is viral, as she has no recent history      of antibiotic treatment, and symptoms completely resolved.   1. Syncopal episode. This is felt likely secondary to volume depletion      and orthostasis, secondary to #1 above, against a background of      inadequate oral intake on day of presentation, as well as      continuing diuretic treatment. Orthostatic blood pressure on       October 30, 2006 showed BP of 102/62 lying, 104/69 sitting, 97/69      standing. She was managed with aggressive intravenous fluid      infusion with normal saline, with resolution of orthostasis and      symptomatology. On October 31, 2006 repeat orthostatics showed the      following findings:  Lying BP 122/83, sitting BP 135/97, standing      BP 147/98. The patient's hydrochlorothiazide has been discontinued      for now, until reevaluated by patient's primary M.D.   1. Right ankle sprain. The patient complained of right lower extremity      pain at the time of initial evaluation in the emergency room. X-ray      of the affected extremity was carried out, which showed no acute      bony injuries, although there was some evidence of enthesopathy. Be      that as it may, physical examination revealed some swelling, and      tenderness of right ankle, particularly on inversion. It is      possible that she may have sprained this ankle. She was managed      with support and analgesics, with good clinical effect. She has      been recommended to call Dr. Thurston Hole, orthopedic surgeon, following      discharge to establish an appointment for follow up. The patient is      agreeable to this plan.   1. Bronchial asthma. The patient remained asymptomatic from this,      throughout the hospitalization, on bronchodilators p.r.n.   1. History of chronic sinusitis. Head CT scan of October 29, 2006      showed improvement in sinus disease compared to that of January      2007. The patient continues on steroid nasal spray.   1. Hypertension. As mentioned in admission history, the patient was      orthostatic at the time of presentation. However, with aggressive      intravenous fluid hydration with normal saline, orthostasis      resolved.  Blood pressure, however, on October 31, 2006 was found     to be elevated at 162/90. This is likely secondary to salt load      from normal saline infusion,  as  well as the fact that      antihypertensives were held, predictably. She has been recommenced      on her pre-admission dose of lisinopril, however. As mentioned      above, we recommend that she not be placed on her      hydrochlorothiazide, until seen by her primary M.D. and      reevaluated.   1. History of hypothyroidism. The patient continues on pre-admission      replacement dosage of Synthroid.   DISPOSITION:  The patient was discharged in satisfactory condition on  October 31, 2006 and is recommended to return to regular duties on  November 07, 2006.   DIET:  Low sodium, healthy heart diet.   WOUND CARE:  Not applicable.   ACTIVITY:  Recommended to increase activity slowly.   FOLLOW UP:  The patient is recommended to follow up with her primary  M.D. next week. She is to call for an appointment. She is also  recommended to establish an appointment with Dr. Thurston Hole, orthopedic  surgeon, telephone number [phone], to be seen within the next three  days. All of this has been communicated to patient. She has verbalized  understanding.      Isidor Holts, M.D.  Electronically Signed     CO/MEDQ  D:  10/31/2006  T:  10/31/2006  Job:  [id number]   cc:   [name]. [name], M.D.  Robert A. Thurston Hole, M.D.

## 2011-04-01 NOTE — Op Note (Signed)
[name], Tammy             ACCOUNT NO.:  [id number]   MEDICAL RECORD NO.:  [id number]          PATIENT TYPE:  AMB   LOCATION:  SDS                          FACILITY:  [facility]   PHYSICIAN:  Robert L. Dione Booze, M.D.  DATE OF BIRTH:  1951-03-21   DATE OF PROCEDURE:  08/18/2005  DATE OF DISCHARGE:                                 OPERATIVE REPORT   INDICATIONS AND JUSTIFICATIONS FOR THE PROCEDURE:  This lady has been  followed in my office since the year 2004 and has been developing cataract  lens changes.  When seen most recently on July 13, 2005 the vision was  20/80 in the right 80 and 20/15 in the left with best correction.  She was  having visual difficulty and had trouble with near and distance vision and  felt that she did want to have cataract surgery.   EXAMINATION:  Showed that the vision was 20/80 right eye and 20/50 left, and  she is extremely myopic.  The pressure is 15 in each eye and confrontation  fields are full.  The pupils, motility, lids, conjunctiva, cornea, anterior  chamber and dilated fundus exam does show a vitreous detachment in both  eyes.  On slit lamp she has cortical and nuclear cataract as lens changes.   The problem has been discussed at several visits and she felt that she was  ready to go in and have her right cataract surgery.  The reason for the  procedure is that she would like to see better.  Medically she should be  stable for this topical procedure.   JUSTIFICATION FOR PERFORMED PROCEDURE IN OUTPATIENT SETTING:  Routine  justification.   JUSTIFICATION FOR OVERNIGHT STAY:  None.   PREOPERATIVE DIAGNOSIS:  Cataract right eye.   POSTOPERATIVE DIAGNOSIS:  Cataract right eye.   PROCEDURE:  __________ phacoemulsification, cataract extraction of the right  eye combined with lens implant.   SURGEON:  Robert L. Dione Booze, M.D.   ANESTHESIA:  Topical tetracaine and IV sedation with anesthesia standby.   DESCRIPTION OF PROCEDURE:  The patient  arrived in the operating room. The  abdomen was prepped and draped in the routine fashion.  A lid speculum was  put into place and a clear corneal incision using a keratome was made  temporally.  Viscoat elastic material was placed into the anterior chamber  and a Superblade was used to make an incision supranasally.  A bent needle  was used to perform the capsulorrhexis along with some capsulorrhexis  forceps.  Hydrodissection was performed and the nucleus was removed with the  phacoemulsifier and the irrigation and aspiration unit was used to remove  the remaining cortical material.  The posterior capsule was polished.  More  Viscoat lasting material was placed into the eye and the lens implant was  placed into the capsular sac.  The wound was hydrated.  The irrigation and  aspiration cannula was used to remove the Viscoat elastic material from the  eye.   Next, the anterior chamber was reformed until the eye was firm.  Zymar was  placed into the eye.  The drape  was then removed and the skin was cleaned  around the eye and the eye was covered with a shield.  The patient then left  the operating room having done nicely.   FOLLOWUP CARE:  The patient will be seen in my office the next day,  __________ examined and to be given further instructions.           ______________________________  Doris Cheadle Dione Booze, M.D.     RLG/MEDQ  D:  08/18/2005  T:  08/18/2005  Job:  [id number]   cc:   [name]. [name], M.D.  Fax: [phone]

## 2011-04-01 NOTE — Assessment & Plan Note (Signed)
[facility]                         GASTROENTEROLOGY OFFICE NOTE   NAME:[name], [name]                      MRN:          [id number]  DATE:12/07/2006                            DOB:          1951-04-24    REASON FOR CONSULTATION:  Blood in the stool.   ASSESSMENT:  A 60 year old white woman that has had problems with  gastroenteritis recently and some loose bowel movements that have  persisted.  She has seen red blood on or mixed in with the stool or on  the toilet paper on a few occasions.   She also has a history of chronic intermittent dysphagia and reflux.  Reflux is controlled by Nexium, but there is chronic intermittent solid  food dysphagia.  Upper GI series demonstrated a prominent  cricopharyngeus back in 2001.  She swallowed a tablet without difficulty  at bedtime.   RECOMMENDATIONS AND PLAN:  1. Schedule colonoscopy to investigate the blood in the stool, plus      she is 55.  2. Schedule upper GI endoscopy with possible esophageal dilation.  She      is describing esophageal dysphagia, and with a suprasternal      sticking point and food that eventually passes.  This is solids and      intermittent.  She thinks she it is better over time.  I suspect      the Nexium may have helped things, but because of the persistence      and the possibility of a stricture that could cause a food      impaction that persists, we will undertake upper GI endoscopy with      possible esophageal dilation.  3. Further plan pending clinical course.  I suspect her erratic bowel      movements may have something to do with some postinfectious      irritable bowel syndrome.  The bleeding certainly could be      hemorrhoids, but we need to be more sure of that.   I have explained the risks, benefits and indications of the procedure.  She understands it and agrees to proceed.   HISTORY:  A 59 year old white woman that developed acute gastroenteritis  and  was admitted to the hospital for 3 days in December.  She eventually  recovered and was released.  There was no bleeding at that time, in fact  she did not really move her bowels much at all, the notes indicate.  She  left the hospital with a very mild anemia with a hemoglobin of 11, but  when she entered her hemoglobin was normal at 13.3.  Since that time,  she has had some erratic loose bowel movements, sometimes very loose.  She has seen red blood mixed in with the stool in the form of red  flecks, she says, as well some blood on the tissue paper.  This has  happened a couple of times.  She also has intermittent solid food  dysphagia, the last time occurring with rice, where she gets a  suprasternal sticking point and has to wait for it to pass.  She does  not seem to have heartburn at this time.  She had a lot of reflux on the  upper GI in 2001.  In the past, she has had some rectal bleeding with  hard stools and directly attributed to hemorrhoids, and only on her  toilet paper.   MEDICATIONS:  1. Hydrochlorothiazide 25 mg daily.  2. Lisinopril 5 mg daily.  3. Metoprolol 25 mg daily.  4. Nexium 40 mg daily.  5. Synthroid 150 mcg daily.  6. Aspirin 81 mg daily.  7. Flonase nasal spray.  8. Advair 100/50 daily.  9. Effexor XR 150 mg daily.  10.Ambien 10 mg nightly.  11.Trazodone 150 mg daily.  12.Proventil p.r.n.  13.Flexeril p.r.n.  14.Xanax 0.5 p.r.n.   DRUG ALLERGIES:  NONE KNOWN.   PAST MEDICAL HISTORY:  1. Hypertension.  2. Asthma.  3. Hypothyroidism.  4. Depression.  5. Chronic headaches.  6. Allergies.  7. Sinus problems.  8. Prior hysterectomy for fibroids.  9. Herpes zoster July 2007.  10.Obesity.  11.Previous benign thyroid nodule.  12.History of lichen planus in the oral cavity.  13.Right foot surgery for deformity of the toes.  14.Bilateral cataract surgery.   FAMILY HISTORY:  No colon cancer reported.  A niece does have  inflammatory bowel disease.   Father had diabetes.  Breast cancer in some  2nd degree relatives.  Heart disease in her mother.   SOCIAL HISTORY:  She is divorced.  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  She lives with her  mother.  She has 1 daughter.  No alcohol, tobacco or drugs.   REVIEW OF SYSTEMS:  1. Eyeglasses.  2. Pedal edema.  3. Cough.  4. Osteoarthritis pain.  5. She had a syncopal episode when she had her admission with      gastroenteritis.   PHYSICAL EXAMINATION:  Reveals a well-developed, obese, white woman, in  no acute distress.  Weight 290 pounds.  Blood pressure 122/76.  Pulse 80.  Eyes anicteric.  ENT:  Missing 1 tooth in the upper jaw, otherwise free of oral lesions  or posterior pharyngeal lesions.  NECK:  Supple.  No thyromegaly or mass.  CHEST:  Clear.  HEART:  S1 and S2.  No murmurs or gallops.  ABDOMEN:  Obese, soft and non-tender without obvious organomegaly or  mass.  EXTREMITIES:  Without peripheral edema in the lower extremities.  LYMPHATIC:  No neck or supraclavicular nodes.  SKIN:  No rash.  PSYCHIATRIC:  She is alert and oriented x3.   I have reviewed the upper GI series with the finding of prominent  cricopharyngeus from 2001.  Laboratory studies and hospital discharge  summary are reviewed as well.  I appreciate the opportunity to care for  this patient.     Iva Boop, MD,FACG  Electronically Signed    CEG/MedQ  DD: 12/07/2006  DT: 12/07/2006  Job #: [id number]

## 2011-04-01 NOTE — Assessment & Plan Note (Signed)
[facility]                         GASTROENTEROLOGY OFFICE NOTE   NAME:NICHOLSKieryn, Burtis                      MRN:          [id number]  DATE:12/18/2006                            DOB:          21-Dec-1950    I called Ms. Amsden to explain the results of her colonoscopy.  She had  2 cecal ulcers.  The biopsy showed aphthous-type changes and Crohn's  disease is in the differential diagnosis.  Her terminal ileum biopsies  were normal.   I explained that this could be Crohn's disease and that it is very  difficult to sort that out at this point.  She does seem to be feeling  better and my overall suspicion is that she probably had some sort of  postinfectious irritable bowel syndrome.  We discussed options of  additional imaging with capsule endoscopy, empiric treatment for Crohn's  disease, and possible CT enterography versus small bowel follow through.  We both agree at this point to observe, and she will call me back if  symptoms worsen, but otherwise, will come to see me in April to check in  and review her situation.  An additional explanation of the availability  of antibody testing to try to determine if she has inflammatory bowel  disease was undertaken as well.  She knows that this could be costly and  is useful in cases, but investigational, and at this time we will hold  off on that as well.     Iva Boop, MD,FACG  Electronically Signed    CEG/MedQ  DD: 12/18/2006  DT: 12/18/2006  Job #: [id number]   cc:   [name]. [name], M.D.

## 2011-04-04 ENCOUNTER — Other Ambulatory Visit: Payer: Self-pay | Admitting: *Deleted

## 2011-04-04 MED ORDER — FUROSEMIDE 40 MG PO TABS: ORAL_TABLET | ORAL | Status: DC

## 2011-04-04 MED ORDER — ZILEUTON 600 MG PO TABS: ORAL_TABLET | ORAL | Status: DC

## 2011-04-04 MED ORDER — TRAZODONE HCL ER 150 MG PO TB24: 1.0000 | ORAL_TABLET | Freq: Every day | ORAL | Status: DC

## 2011-04-04 MED ORDER — DULOXETINE HCL 60 MG PO CPEP: 60.0000 mg | ORAL_CAPSULE | Freq: Every day | ORAL | Status: DC

## 2011-04-04 MED ORDER — ESOMEPRAZOLE MAGNESIUM 40 MG PO CPDR: 40.0000 mg | DELAYED_RELEASE_CAPSULE | Freq: Two times a day (BID) | ORAL | Status: DC

## 2011-04-04 MED ORDER — METOPROLOL TARTRATE 25 MG PO TABS: 25.0000 mg | ORAL_TABLET | Freq: Every day | ORAL | Status: DC

## 2011-04-04 MED ORDER — LEVOTHYROXINE SODIUM 175 MCG PO TABS: 175.0000 ug | ORAL_TABLET | Freq: Every day | ORAL | Status: DC

## 2011-05-27 ENCOUNTER — Encounter: Payer: Self-pay | Admitting: Internal Medicine

## 2011-05-31 ENCOUNTER — Other Ambulatory Visit: Payer: Self-pay | Admitting: Internal Medicine

## 2011-06-02 ENCOUNTER — Encounter: Payer: Self-pay | Admitting: Internal Medicine

## 2011-06-02 ENCOUNTER — Ambulatory Visit (INDEPENDENT_AMBULATORY_CARE_PROVIDER_SITE_OTHER): Payer: Commercial Managed Care - PPO | Admitting: Internal Medicine

## 2011-06-02 DIAGNOSIS — E119 Type 2 diabetes mellitus without complications: Secondary | ICD-10-CM

## 2011-06-02 DIAGNOSIS — E559 Vitamin D deficiency, unspecified: Secondary | ICD-10-CM

## 2011-06-02 DIAGNOSIS — J45909 Unspecified asthma, uncomplicated: Secondary | ICD-10-CM

## 2011-06-02 DIAGNOSIS — E785 Hyperlipidemia, unspecified: Secondary | ICD-10-CM

## 2011-06-02 DIAGNOSIS — G43909 Migraine, unspecified, not intractable, without status migrainosus: Secondary | ICD-10-CM

## 2011-06-02 DIAGNOSIS — Z0000 Encounter for general adult medical examination without abnormal findings: Secondary | ICD-10-CM

## 2011-06-02 DIAGNOSIS — E039 Hypothyroidism, unspecified: Secondary | ICD-10-CM

## 2011-06-02 DIAGNOSIS — I10 Essential (primary) hypertension: Secondary | ICD-10-CM

## 2011-06-02 LAB — HEMOGLOBIN A1C
Hgb A1c MFr Bld: 6 % — ABNORMAL HIGH (ref <5.7)
Mean Plasma Glucose: 126 mg/dL — ABNORMAL HIGH (ref <117)

## 2011-06-02 LAB — LIPID PANEL
Total CHOL/HDL Ratio: 4.4 Ratio
VLDL: 26 mg/dL (ref 0–40)

## 2011-06-03 ENCOUNTER — Encounter: Payer: Self-pay | Admitting: Internal Medicine

## 2011-06-03 LAB — TSH: TSH: 1.479 u[IU]/mL (ref 0.350–4.500)

## 2011-06-15 NOTE — Progress Notes (Signed)
  Subjective:    Patient ID: [name], female    DOB: 1951/09/20, 60 y.o.   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 Mother is chronically ill with congestive heart failure and that is quite stressful to patient. Patient is divorced. Has one son. History of C-section 1977, total abdominal hysterectomy with right salpingo-oophorectomy 1998, cataract extraction both eyes 2006 left knee medial meniscus repair 2008, right upper pneumonia Feb 2011, strep throat 2005, fractured left radial head 1995, epicondylitis right elbow in the remote past.  Hospital last April 2011 with chest pain. History of colitis involving left colon 2009 on colonoscopy but repeat sigmoidoscopy 2010 was normal with the exception of sigmoid diverticulosis.Marland Kitchen History of irritable bowel syndrome, history of gallstones. History of herpes zoster 2007. History of nasal surgery 2010. History of asthma and allergic rhinitis, chronic sinusitis and nasal polyposis. History of recurrent angioedema. Nuclear medicine study for evaluation of chest pain 2009. Vitreous separation right eye 2004. History of reactive lymphocytosis seen by oncologist 2007.    Review of Systems     Objective:   Physical Exam chest clear, cardiac exam regular rate and rhythm, neck without thyromegaly, trace lower extremity edema        Assessment & Plan:  Hypothyroidism  Adult onset diabetes  Dependent edema  Migraine headache  Lichen planus  Hypertension  Morbid obesity  Plan is to return in 6 months for physical examination.

## 2011-08-10 LAB — URINALYSIS, ROUTINE W REFLEX MICROSCOPIC
Bilirubin Urine: NEGATIVE
Glucose, UA: NEGATIVE
Hgb urine dipstick: NEGATIVE
Ketones, ur: 15 — AB
Protein, ur: 30 — AB
Urobilinogen, UA: 0.2

## 2011-08-10 LAB — POCT I-STAT, CHEM 8
Creatinine, Ser: 0.9
HCT: 40
Hemoglobin: 13.6
Potassium: 4.2
Sodium: 137
TCO2: 29

## 2011-08-10 LAB — CBC
Hemoglobin: 10.3 — ABNORMAL LOW
MCHC: 33
MCHC: 33.9
MCV: 78.6
Platelets: 308
RBC: 3.98
RBC: 4.89
RDW: 15.4
WBC: 14.2 — ABNORMAL HIGH

## 2011-08-10 LAB — URINE CULTURE
Colony Count: NO GROWTH
Culture: NO GROWTH
Special Requests: NEGATIVE

## 2011-08-10 LAB — COMPREHENSIVE METABOLIC PANEL
ALT: 17
Albumin: 3 — ABNORMAL LOW
Alkaline Phosphatase: 104
Calcium: 8.6
Glucose, Bld: 163 — ABNORMAL HIGH
Potassium: 3.4 — ABNORMAL LOW
Sodium: 137
Total Protein: 6.1

## 2011-08-10 LAB — DIFFERENTIAL
Basophils Absolute: 0
Basophils Relative: 0
Lymphocytes Relative: 9 — ABNORMAL LOW
Monocytes Relative: 5
Neutro Abs: 16.9 — ABNORMAL HIGH
Neutrophils Relative %: 86 — ABNORMAL HIGH

## 2011-08-10 LAB — BASIC METABOLIC PANEL
Calcium: 8.5
GFR calc Af Amer: >60
GFR calc non Af Amer: >60
Glucose, Bld: 138 — ABNORMAL HIGH
Potassium: 3.2 — ABNORMAL LOW
Sodium: 138

## 2011-08-10 LAB — HEMOGLOBIN AND HEMATOCRIT, BLOOD
HCT: 30.3 — ABNORMAL LOW
HCT: 31.7 — ABNORMAL LOW
HCT: 34 — ABNORMAL LOW
Hemoglobin: 11.1 — ABNORMAL LOW
Hemoglobin: 9.9 — ABNORMAL LOW

## 2011-08-10 LAB — FECAL LACTOFERRIN, QUANT: Fecal Lactoferrin: POSITIVE

## 2011-08-10 LAB — GIARDIA/CRYPTOSPORIDIUM SCREEN(EIA): Cryptosporidium Screen (EIA): NEGATIVE

## 2011-08-10 LAB — URINE MICROSCOPIC-ADD ON

## 2011-08-10 LAB — RAPID URINE DRUG SCREEN, HOSP PERFORMED
Barbiturates: NOT DETECTED
Opiates: NOT DETECTED

## 2011-08-10 LAB — APTT: aPTT: 36

## 2011-08-10 LAB — PHOSPHORUS: Phosphorus: 3.2

## 2011-08-10 LAB — TSH: TSH: 1.787

## 2011-08-10 LAB — PROTIME-INR: INR: 1

## 2011-08-10 LAB — MAGNESIUM: Magnesium: 1.9

## 2011-08-11 LAB — CBC
HCT: 32.4 — ABNORMAL LOW
MCV: 79.5
Platelets: 346
Platelets: 348
RDW: 15.5
RDW: 15.9 — ABNORMAL HIGH

## 2011-08-11 LAB — BASIC METABOLIC PANEL
BUN: 3 — ABNORMAL LOW
Creatinine, Ser: 0.96
GFR calc non Af Amer: >60
Glucose, Bld: 139 — ABNORMAL HIGH
Potassium: 3.5

## 2011-08-12 LAB — DIFFERENTIAL
Basophils Relative: 3 — ABNORMAL HIGH
Eosinophils Absolute: 0.1
Monocytes Relative: 8
Neutrophils Relative %: 52

## 2011-08-12 LAB — POCT CARDIAC MARKERS
CKMB, poc: 1.1
Troponin i, poc: <0.05

## 2011-08-12 LAB — CBC
Hemoglobin: 13.7
RBC: 5.17 — ABNORMAL HIGH

## 2011-08-12 LAB — COMPREHENSIVE METABOLIC PANEL
ALT: 17
Alkaline Phosphatase: 85
CO2: 29
Chloride: 99
GFR calc non Af Amer: 58 — ABNORMAL LOW
Glucose, Bld: 118 — ABNORMAL HIGH
Potassium: 3.5
Sodium: 139

## 2011-09-01 LAB — POCT HEMOGLOBIN-HEMACUE: Operator id: 123881

## 2011-09-29 ENCOUNTER — Other Ambulatory Visit: Payer: Self-pay | Admitting: Internal Medicine

## 2011-09-29 NOTE — Telephone Encounter (Signed)
Call in #90 with 1 refill please

## 2011-10-04 ENCOUNTER — Other Ambulatory Visit: Payer: Self-pay | Admitting: Internal Medicine

## 2011-10-04 NOTE — Telephone Encounter (Signed)
Please refill for 6 months #90 with one refill. See when she is due for OV and labs please.

## 2011-11-12 ENCOUNTER — Emergency Department (HOSPITAL_BASED_OUTPATIENT_CLINIC_OR_DEPARTMENT_OTHER)
Admission: EM | Admit: 2011-11-12 | Discharge: 2011-11-12 | Disposition: A | Discharge status: Home / Self Care | Payer: 59 | Attending: Emergency Medicine | Admitting: Emergency Medicine

## 2011-11-12 ENCOUNTER — Encounter (HOSPITAL_BASED_OUTPATIENT_CLINIC_OR_DEPARTMENT_OTHER): Payer: Self-pay | Admitting: *Deleted

## 2011-11-12 DIAGNOSIS — J069 Acute upper respiratory infection, unspecified: Secondary | ICD-10-CM

## 2011-11-12 DIAGNOSIS — J45909 Unspecified asthma, uncomplicated: Secondary | ICD-10-CM | POA: Insufficient documentation

## 2011-11-12 DIAGNOSIS — E119 Type 2 diabetes mellitus without complications: Secondary | ICD-10-CM | POA: Insufficient documentation

## 2011-11-12 DIAGNOSIS — E079 Disorder of thyroid, unspecified: Secondary | ICD-10-CM | POA: Insufficient documentation

## 2011-11-12 DIAGNOSIS — J40 Bronchitis, not specified as acute or chronic: Secondary | ICD-10-CM

## 2011-11-12 MED ORDER — PREDNISONE 10 MG PO TABS
60.0000 mg | ORAL_TABLET | Freq: Once | ORAL | Status: AC
  Administered 2011-11-12: 60 mg via ORAL
  Filled 2011-11-12: qty 1

## 2011-11-12 MED ORDER — IPRATROPIUM BROMIDE 0.02 % IN SOLN
0.5000 mg | Freq: Once | RESPIRATORY_TRACT | Status: AC
  Administered 2011-11-12: 0.5 mg via RESPIRATORY_TRACT
  Filled 2011-11-12: qty 2.5

## 2011-11-12 MED ORDER — IPRATROPIUM BROMIDE 0.02 % IN SOLN
0.5000 mg | Freq: Once | RESPIRATORY_TRACT | Status: AC
  Administered 2011-11-12: 0.5 mg via RESPIRATORY_TRACT

## 2011-11-12 MED ORDER — ALBUTEROL SULFATE (5 MG/ML) 0.5% IN NEBU
5.0000 mg | INHALATION_SOLUTION | Freq: Once | RESPIRATORY_TRACT | Status: AC
  Administered 2011-11-12: 5 mg via RESPIRATORY_TRACT
  Filled 2011-11-12: qty 1

## 2011-11-12 MED ORDER — ALBUTEROL SULFATE (5 MG/ML) 0.5% IN NEBU
5.0000 mg | INHALATION_SOLUTION | Freq: Once | RESPIRATORY_TRACT | Status: AC
  Administered 2011-11-12: 5 mg via RESPIRATORY_TRACT

## 2011-11-12 MED ORDER — PREDNISONE 20 MG PO TABS: 40.0000 mg | ORAL_TABLET | Freq: Every day | ORAL | Status: DC

## 2011-11-12 MED ORDER — PREDNISONE 10 MG PO TABS: 40.0000 mg | ORAL_TABLET | Freq: Every day | ORAL | Status: DC

## 2011-11-12 MED ORDER — HYDROCOD POLST-CHLORPHEN POLST 10-8 MG/5ML PO LQCR: 5.0000 mL | Freq: Two times a day (BID) | ORAL | Status: DC | PRN

## 2011-11-12 MED ORDER — HYDROCOD POLST-CHLORPHEN POLST 10-8 MG/5ML PO LQCR
5.0000 mL | Freq: Once | ORAL | Status: AC
  Administered 2011-11-12: 5 mL via ORAL
  Filled 2011-11-12: qty 5

## 2011-11-12 MED ORDER — ALBUTEROL SULFATE (5 MG/ML) 0.5% IN NEBU
INHALATION_SOLUTION | RESPIRATORY_TRACT | Status: AC
  Administered 2011-11-12: 5 mg via RESPIRATORY_TRACT
  Filled 2011-11-12: qty 1

## 2011-11-12 MED ORDER — IPRATROPIUM BROMIDE 0.02 % IN SOLN
RESPIRATORY_TRACT | Status: AC
  Administered 2011-11-12: 0.5 mg via RESPIRATORY_TRACT
  Filled 2011-11-12: qty 2.5

## 2011-11-12 NOTE — ED Notes (Signed)
Patient received an nebulizer treatment and has improved air movement and work of breathing. Patient initially was very short of breath, audibly wheezing, and saturating low on room air. She was also placed on 2lpm and is now saturating 97%. Rt will cont. to monitor.

## 2011-11-12 NOTE — ED Provider Notes (Signed)
History  This chart was scribed for [name]. Tammy Lamas, MD by Bennett Scrape. This patient was seen in room MH03/MH03 and the patient's care was started at 5:32PM.  CSN: [id number]  Arrival date & time 11/12/11  1649   First MD Initiated Contact with Patient 11/12/11 1704      Chief Complaint  Patient presents with  . Asthma   Patient is a 60 y.o. female presenting with shortness of breath. The history is provided by the patient. No language interpreter was used.  Shortness of Breath  The current episode started 3 to 5 days ago. The onset was gradual. The problem occurs continuously. The problem has been gradually worsening. The symptoms are relieved by nothing. The symptoms are aggravated by nothing. Associated symptoms include chest pressure, cough, shortness of breath and wheezing. Pertinent negatives include no chest pain, no orthopnea, no fever, no rhinorrhea and no sore throat. There was no intake of a foreign body. She has not inhaled smoke recently. She is currently using steroids. She has had prior hospitalizations. She has had no prior ICU admissions. She has had no prior intubations. Her past medical history is significant for asthma. Her past medical history does not include bronchiolitis, past wheezing, eczema or asthma in the family. There were no sick contacts. Recently, medical care has been given at this facility. Services received include medications given.    [name] is a 60 y.o. female with a h/o asthma who presents to the Emergency Department complaining of 3 days of gradual onset, gradually worsening SOB with associated non-productive cough, nasal congestion, chest pressure, chills/ hot flashes and lethargy.  Pt was sent here from [facility] due to a oxygen saturation of 91%. Pt reports uses albuterol inhalers at home with no improvement in her symptoms. She states that the chest pressure she is experiencing now is similar to the chest pressure experience with  previous asthma attacks. Pt denies chest pain, nausea, vomiting and diarrhea as associated symptoms. Pt states that she has a h/o sinus problems due to environmental allergies and has had previous hospitalizations for asthma attacks. Pt reports that she has had the flu vaccination this year. Pt denies smoking and alcohol use.  Past Medical History  Diagnosis Date  . Thyroid disease   . Migraine   . Diabetes mellitus   . Asthma     Past Surgical History  Procedure Date  . Cesarean section   . Abdominal hysterectomy   . Knee cartilage surgery     Left  . Cataract extraction, bilateral     History reviewed. No pertinent family history.  History  Substance Use Topics  . Smoking status: Former Games developer  . Smokeless tobacco: Not on file   Comment: 25 years ago  . Alcohol Use: No    OB History    Grav Para Term Preterm Abortions TAB SAB Ect Mult Living                  Review of Systems  Constitutional: Positive for chills. Negative for fever.  HENT: Positive for congestion. Negative for sore throat, rhinorrhea and neck stiffness.   Respiratory: Positive for cough, shortness of breath and wheezing.   Cardiovascular: Negative for chest pain, orthopnea and leg swelling.  Gastrointestinal: Negative for nausea, vomiting and diarrhea.  Genitourinary: Negative for dysuria, frequency and hematuria.  Musculoskeletal: Negative for back pain and joint swelling.  Skin: Negative for rash.  Neurological: Negative for weakness and headaches.  All other systems  reviewed and are negative.    Allergies  Levofloxacin; Lisinopril; Bee venom; and Morphine and related  Home Medications   Current Outpatient Rx  Name Route Sig Dispense Refill  . ACETAMINOPHEN 325 MG PO TABS Oral Take 650 mg by mouth every 6 (six) hours as needed. For pain     . VENTOLIN IN Inhalation Inhale 2 puffs into the lungs every 4 (four) hours as needed. For shortness of breath     . ASPIRIN 81 MG PO TABS Oral Take  81 mg by mouth daily.      Marland Kitchen CETIRIZINE HCL 10 MG PO TABS Oral Take 10 mg by mouth daily.      Marland Kitchen VITAMIN D 2000 UNITS PO TABS Oral Take 2,000 Units by mouth daily.      . DULOXETINE HCL 60 MG PO CPEP Oral Take 1 capsule (60 mg total) by mouth daily. 30 capsule 3  . ESOMEPRAZOLE MAGNESIUM 40 MG PO CPDR Oral Take 1 capsule (40 mg total) by mouth 2 (two) times daily. 180 capsule 1  . FAMOTIDINE 20 MG PO TABS Oral Take 20 mg by mouth at bedtime.      Marland Kitchen FLUTICASONE PROPIONATE 50 MCG/ACT NA SUSP Nasal Place 2 sprays into the nose daily.      . FUROSEMIDE 40 MG PO TABS Oral Take 40 mg by mouth daily.      Marland Kitchen LEVOTHYROXINE SODIUM 175 MCG PO TABS Oral Take 1 tablet (175 mcg total) by mouth daily. 90 tablet 1  . METOPROLOL TARTRATE 25 MG PO TABS Oral Take 1 tablet (25 mg total) by mouth daily. 90 tablet 3  . TRAZODONE HCL 150 MG PO TB24 Oral Take 1 tablet (150 mg total) by mouth at bedtime. 90 tablet 3  . VITAMIN B12 100 MCG PO TABS Oral Take 100 mcg by mouth daily.      Marland Kitchen ZILEUTON 600 MG PO TABS  Take 2 tablets po BID 360 each 3  . ZOLPIDEM TARTRATE 10 MG PO TABS       . HYDROCOD POLST-CHLORPHEN POLST 10-8 MG/5ML PO LQCR Oral Take 5 mLs by mouth every 12 (twelve) hours as needed. 120 mL 0  . PREDNISONE 10 MG PO TABS Oral Take 4 tablets (40 mg total) by mouth daily. 10 tablet 0    Triage Vitals: BP 168/104  Pulse 110  Temp(Src) 98.4 F (36.9 C) (Oral)  Resp 28  Ht 5\' 5"  (1.651 m)  Wt 300 lb (136.079 kg)  BMI 49.92 kg/m2  SpO2 97%  Physical Exam  Nursing note and vitals reviewed. Constitutional: She is oriented to person, place, and time. She appears well-developed and well-nourished.  HENT:  Head: Normocephalic and atraumatic.       Nasal congestion   Eyes: Conjunctivae and EOM are normal.  Neck: Normal range of motion. Neck supple. No tracheal deviation present.  Cardiovascular: Normal rate, regular rhythm and normal heart sounds.   Pulmonary/Chest: Effort normal. She has wheezes (Mild  inspiratory and expiratory wheezing diffusely).  Abdominal: Soft. There is no tenderness.  Musculoskeletal: Normal range of motion. She exhibits no edema.  Neurological: She is alert and oriented to person, place, and time. No cranial nerve deficit.  Skin: Skin is warm and dry.  Psychiatric: She has a normal mood and affect. Her behavior is normal.    ED Course  Procedures (including critical care time)  DIAGNOSTIC STUDIES: Oxygen Saturation is 97% on [place], adequate by my interpretation.    COORDINATION OF CARE: 5:35PM-Discussed  treatment plan with pt at bedside and pt agreed to plan. Will prescribe Tussin x and prednisone for the next few days.   Labs Reviewed - No data to display No results found.   1. Bronchitis   2. Upper respiratory infection       MDM  Pt already improved after first neb here.  Pt with asthma, likely URI since symptoms were more gradual in onset.  No fever here.  sats after first neb now 98% on RA.  Wheezing is only mild after first neb.  Will give another, then tussionex and oral steroids for home.  She has inhaler at home already.        I personally performed the services described in this documentation, which was scribed in my presence. The recorded information has been reviewed and considered.    6:26 PM Pt feels improved. sats are normal.  [name]. Tammy Lamas, MD 11/12/11 [id number]

## 2011-11-12 NOTE — ED Notes (Signed)
Pt states she has asthma and has had a cough x 3 days. Increased SHOB today. Went to [facility] Sat 91%. Sent here. Audible wheezing and increased SHOB at triage. Resp Annabelle Harman) called to evaluate.

## 2011-11-12 NOTE — Discharge Instructions (Signed)
Bronchitis Bronchitis is the body's way of reacting to injury and/or infection (inflammation) of the bronchi. Bronchi are the air tubes that extend from the windpipe into the lungs. If the inflammation becomes severe, it may cause shortness of breath. CAUSES  Inflammation may be caused by:  A virus.   Germs (bacteria).   Dust.   Allergens.   Pollutants and many other irritants.  The cells lining the bronchial tree are covered with tiny hairs (cilia). These constantly beat upward, away from the lungs, toward the mouth. This keeps the lungs free of pollutants. When these cells become too irritated and are unable to do their job, mucus begins to develop. This causes the characteristic cough of bronchitis. The cough clears the lungs when the cilia are unable to do their job. Without either of these protective mechanisms, the mucus would settle in the lungs. Then you would develop pneumonia. Smoking is a common cause of bronchitis and can contribute to pneumonia. Stopping this habit is the single most important thing you can do to help yourself. TREATMENT   Your caregiver may prescribe an antibiotic if the cough is caused by bacteria. Also, medicines that open up your airways make it easier to breathe. Your caregiver may also recommend or prescribe an expectorant. It will loosen the mucus to be coughed up. Only take over-the-counter or prescription medicines for pain, discomfort, or fever as directed by your caregiver.   Removing whatever causes the problem (smoking, for example) is critical to preventing the problem from getting worse.   Cough suppressants may be prescribed for relief of cough symptoms.   Inhaled medicines may be prescribed to help with symptoms now and to help prevent problems from returning.   For those with recurrent (chronic) bronchitis, there may be a need for steroid medicines.  SEEK IMMEDIATE MEDICAL CARE IF:   During treatment, you develop more pus-like mucus  (purulent sputum).   You become progressively more ill.   You have increased difficulty breathing, wheezing, or shortness of breath.  It is necessary to seek immediate medical care if you are elderly or sick from any other disease. MAKE SURE YOU:   Understand these instructions.   Will watch your condition.   Will get help right away if you are not doing well or get worse.  Document Released: 10/31/2005 Document Revised: 07/13/2011 Document Reviewed: 09/09/2008 [facility] Patient Information 2012 [place], [place].

## 2011-12-06 ENCOUNTER — Other Ambulatory Visit: Payer: Commercial Managed Care - PPO | Admitting: Internal Medicine

## 2011-12-09 ENCOUNTER — Encounter: Payer: Commercial Managed Care - PPO | Admitting: Internal Medicine

## 2011-12-20 ENCOUNTER — Other Ambulatory Visit: Payer: 59 | Admitting: Internal Medicine

## 2011-12-20 DIAGNOSIS — Z0000 Encounter for general adult medical examination without abnormal findings: Secondary | ICD-10-CM

## 2011-12-20 DIAGNOSIS — I10 Essential (primary) hypertension: Secondary | ICD-10-CM

## 2011-12-20 LAB — CBC WITH DIFFERENTIAL/PLATELET
Basophils Absolute: 0.1 10*3/uL (ref 0.0–0.1)
Eosinophils Absolute: 0.4 10*3/uL (ref 0.0–0.7)
Eosinophils Relative: 4 % (ref 0–5)
Lymphocytes Relative: 41 % (ref 12–46)
Lymphs Abs: 3.5 10*3/uL (ref 0.7–4.0)
MCH: 28.2 pg (ref 26.0–34.0)
MCV: 88.2 fL (ref 78.0–100.0)
Neutrophils Relative %: 46 % (ref 43–77)
Platelets: 290 10*3/uL (ref 150–400)
RBC: 4.75 MIL/uL (ref 3.87–5.11)
RDW: 13.9 % (ref 11.5–15.5)
WBC: 8.5 10*3/uL (ref 4.0–10.5)

## 2011-12-20 LAB — COMPREHENSIVE METABOLIC PANEL
ALT: 27 U/L (ref 0–35)
AST: 31 U/L (ref 0–37)
CO2: 29 mEq/L (ref 19–32)
Creat: 0.8 mg/dL (ref 0.50–1.10)
Sodium: 142 mEq/L (ref 135–145)
Total Bilirubin: 0.3 mg/dL (ref 0.3–1.2)
Total Protein: 6.3 g/dL (ref 6.0–8.3)

## 2011-12-20 LAB — LIPID PANEL
HDL: 42 mg/dL (ref >39)
LDL Cholesterol: 130 mg/dL — ABNORMAL HIGH (ref 0–99)
Total CHOL/HDL Ratio: 4.7 Ratio
Triglycerides: 123 mg/dL (ref <150)
VLDL: 25 mg/dL (ref 0–40)

## 2011-12-20 LAB — HEMOGLOBIN A1C: Hgb A1c MFr Bld: 6.2 % — ABNORMAL HIGH (ref <5.7)

## 2011-12-22 ENCOUNTER — Encounter: Payer: Self-pay | Admitting: Internal Medicine

## 2011-12-22 ENCOUNTER — Ambulatory Visit (INDEPENDENT_AMBULATORY_CARE_PROVIDER_SITE_OTHER): Payer: 59 | Admitting: Internal Medicine

## 2011-12-22 VITALS — BP 166/98 | HR 88 | Temp 99.0°F | Ht 64.5 in | Wt 292.0 lb

## 2011-12-22 DIAGNOSIS — E039 Hypothyroidism, unspecified: Secondary | ICD-10-CM

## 2011-12-22 DIAGNOSIS — E1169 Type 2 diabetes mellitus with other specified complication: Secondary | ICD-10-CM

## 2011-12-22 DIAGNOSIS — I10 Essential (primary) hypertension: Secondary | ICD-10-CM

## 2011-12-22 DIAGNOSIS — L439 Lichen planus, unspecified: Secondary | ICD-10-CM

## 2011-12-22 DIAGNOSIS — Z0000 Encounter for general adult medical examination without abnormal findings: Secondary | ICD-10-CM

## 2011-12-22 LAB — POCT URINALYSIS DIPSTICK
Blood, UA: NEGATIVE
Ketones, UA: NEGATIVE
Protein, UA: NEGATIVE
Spec Grav, UA: 1.025
Urobilinogen, UA: NEGATIVE
pH, UA: 6

## 2012-01-09 ENCOUNTER — Other Ambulatory Visit: Payer: Self-pay | Admitting: Internal Medicine

## 2012-01-19 ENCOUNTER — Ambulatory Visit: Payer: 59 | Admitting: Internal Medicine

## 2012-02-07 ENCOUNTER — Encounter: Payer: Self-pay | Admitting: Internal Medicine

## 2012-02-07 ENCOUNTER — Ambulatory Visit (INDEPENDENT_AMBULATORY_CARE_PROVIDER_SITE_OTHER): Payer: 59 | Admitting: Internal Medicine

## 2012-02-07 VITALS — BP 124/82 | HR 84 | Temp 98.2°F | Wt 292.0 lb

## 2012-02-07 DIAGNOSIS — R7309 Other abnormal glucose: Secondary | ICD-10-CM

## 2012-02-07 DIAGNOSIS — J45909 Unspecified asthma, uncomplicated: Secondary | ICD-10-CM

## 2012-02-07 DIAGNOSIS — T783XXA Angioneurotic edema, initial encounter: Secondary | ICD-10-CM

## 2012-02-07 DIAGNOSIS — R7302 Impaired glucose tolerance (oral): Secondary | ICD-10-CM

## 2012-02-07 DIAGNOSIS — E7849 Other hyperlipidemia: Secondary | ICD-10-CM | POA: Insufficient documentation

## 2012-02-07 DIAGNOSIS — K5289 Other specified noninfective gastroenteritis and colitis: Secondary | ICD-10-CM

## 2012-02-07 DIAGNOSIS — J309 Allergic rhinitis, unspecified: Secondary | ICD-10-CM

## 2012-02-07 DIAGNOSIS — Z8669 Personal history of other diseases of the nervous system and sense organs: Secondary | ICD-10-CM | POA: Insufficient documentation

## 2012-02-07 DIAGNOSIS — J329 Chronic sinusitis, unspecified: Secondary | ICD-10-CM

## 2012-02-07 DIAGNOSIS — E785 Hyperlipidemia, unspecified: Secondary | ICD-10-CM

## 2012-02-07 DIAGNOSIS — Z87898 Personal history of other specified conditions: Secondary | ICD-10-CM

## 2012-02-07 DIAGNOSIS — L439 Lichen planus, unspecified: Secondary | ICD-10-CM

## 2012-02-07 DIAGNOSIS — I10 Essential (primary) hypertension: Secondary | ICD-10-CM

## 2012-02-07 DIAGNOSIS — K529 Noninfective gastroenteritis and colitis, unspecified: Secondary | ICD-10-CM

## 2012-02-07 DIAGNOSIS — E039 Hypothyroidism, unspecified: Secondary | ICD-10-CM

## 2012-02-07 DIAGNOSIS — K5900 Constipation, unspecified: Secondary | ICD-10-CM

## 2012-02-07 LAB — BASIC METABOLIC PANEL
BUN: 13 mg/dL (ref 6–23)
CO2: 32 mEq/L (ref 19–32)
Calcium: 9.4 mg/dL (ref 8.4–10.5)
Creat: 0.9 mg/dL (ref 0.50–1.10)
Glucose, Bld: 144 mg/dL — ABNORMAL HIGH (ref 70–99)
Sodium: 142 mEq/L (ref 135–145)

## 2012-02-07 NOTE — Patient Instructions (Signed)
Take MiraLAX 17 g in 8 ounces of water once or twice daily for constipation. Continue Cozaar 50 mg daily. Take antihistamine for serous otitis media. Return in 6 months.

## 2012-02-07 NOTE — Progress Notes (Signed)
  Subjective:    Patient ID: [name], female    DOB: 12/13/1950, 61 y.o.   MRN: [id number]  HPI 62 year old white female with multiple medical problems was here about a month ago for physical exam. Blood pressure was elevated and she was started on Cozaar 50 mg daily. She is tolerating this well. Has had issues for while with constipation. Takes many medications. Have suggested MiraLAX on a daily basis may take one to 2 cups bull's-eye daily. Sometimes goes 3 or 4 days without a bowel movement. Sometimes bowel movement is hard. Also complaining of some ear pain. No recent URI. Be met was checked today on Cozaar 50 mg daily.    Review of Systems     Objective:   Physical Exam chest clear to auscultation; cardiac exam regular rate and rhythm; extremities without edema. TMs are full bilaterally but not red.        Assessment & Plan:  Serous otitis media bilateral  Hypertension  Constipation  Plan: MiraLAX one to two  8 oz cups daily-mix 17 g in 8 ounces of water as one dose. Continue Cozaar 50 mg daily. May take antihistamine for serous otitis media. Return in 6 months for office visit, TSH, blood pressure check and b-met.

## 2012-02-14 ENCOUNTER — Encounter: Payer: Self-pay | Admitting: Internal Medicine

## 2012-02-14 NOTE — Progress Notes (Signed)
Subjective:    Patient ID: [name], female    DOB: 05/26/1951, 61 y.o.   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  Patient has no known drug allergies. Morphine causes itching. Local reaction to bee stings not systemic.  Past medical history fractured left radial head 1995, epicondyle light his right elbow in the past, strep throat January 2005, right upper lobe pneumonia March 2011.  C-section 1977, abdominal hysterectomy and right salpingectomy January 1998, cataract extraction right eye September 2006, left eye September 2006. These were done 2 weeks apart. Repair left knee medial meniscus June 2008.  Immunizations had Pneumovax immunization 05/06/1999, gets annual influenza immunization through employment as well as Tdap vaccine through employment.  Patient admitted December 2007 with syncope. Herpes zoster July 2007. Hospitalized with probable viral gastroenteritis April 2011. Right upper lobe pneumonia March 2011. History of angioedema of face February 2011. Surgery for chronic sinusitis November 2010. History of gallstones and hepatic steatosis. History of depression. History of allergic rhinitis. History of iron deficiency anemia that resolved after hysterectomy. History of noncardiac and nonpulmonary chest pain 2011. Had negative cardiac enzymes, negative EKG, negative CT angiography although D- dimer was elevated.  Colonoscopy June 2009 showed segment of severe colitis in the left colon. Previously had pan endoscopy January 2008 which was sent for some nodules in the terminal ileum that could be inflammatory bowel disease or from aspirin or anti-inflammatory  medications. Patient had diverticulosis and small hemorrhoids. Pap biology done from biopsy June 2009 revealed ulcerated colonic mucosa without granulomas. Differential included ischemic-related ulceration.  Allergy skin test positive to dust mite and mold 2009. Spirometry showed FVC of 2.61 which was 81% of predicted, FEV1 was 1.96 which was 75% of predicted, following administration of inhaled albuterol FEV1 rose to 93% of predicted.  Family history: Father is deceased with history of stroke, hypertension, diabetes and COPD, mother still living with congestive heart failure and chronic kidney disease. One sister in good health. One son in good health.    Patient has been a patient in this office since September 1996. She is divorced. She does not smoke. Occasional alcohol consumption. Weight in 1996 was 307 pounds. Weight in November 2001 was 282 pounds. Weight in 2007 was 289 pounds. Weight in January 2011 was 290 pounds. Weight in January 2012 was 294.  FMLA form completed July 2012 for migraine headaches and recurrent sinusitis    Review of Systems  Constitutional: Positive for fatigue.  HENT: Negative.   Eyes: Negative.   Respiratory: Negative.   Cardiovascular: Negative.   Gastrointestinal: Negative.   Genitourinary: Negative.   Musculoskeletal: Positive for back pain.  Neurological:       History of migraine headaches  Hematological: Negative.   Psychiatric/Behavioral:       History of depression       Objective:   Physical Exam  Vitals reviewed. Constitutional: She is oriented to person, place, and time. She appears well-developed and well-nourished. No distress.  HENT:  Head: Normocephalic and atraumatic.  Right Ear: External ear normal.  Left Ear: External ear normal.  Nose: Nose normal.  Mouth/Throat: Oropharynx is clear and moist. No oropharyngeal exudate.  Eyes: Conjunctivae and EOM are normal. Pupils are equal, round, and reactive to light. Right eye exhibits  no discharge. Left eye exhibits no discharge. No scleral icterus.  Neck: Neck supple. No JVD present. No thyromegaly present.  Cardiovascular: Normal rate, regular rhythm, normal heart sounds and intact distal pulses.   No murmur heard. Pulmonary/Chest: Effort normal and breath sounds normal. No respiratory distress. She has no wheezes. She has no rales.       Pendulous without masses  Abdominal: Soft. Bowel sounds are normal. She exhibits no distension and no mass. There is no tenderness. There is no rebound and no guarding.  Genitourinary:       Deferred  Musculoskeletal: She exhibits no edema.  Lymphadenopathy:    She has no cervical adenopathy.  Neurological: She is alert and oriented to person, place, and time. She has normal reflexes. No cranial nerve deficit. Coordination normal.  Skin: Skin is warm and dry. No rash noted. She is not diaphoretic.  Psychiatric: She has a normal mood and affect. Judgment and thought content normal.          Assessment & Plan:  Morbid obesity  Hypertension  Hyperlipidemia  Adult onset diabetes mellitus  Allergic rhinitis  GE reflux  History of colitis  Lichen planus  Asthma  Hypothyroidism  Migraine headaches  Hypothyroidism  Insomnia  Plan: Continue current medications and return in 6 months for office visit lipid panel liver functions hemoglobin A1c and TSH.

## 2012-02-14 NOTE — Patient Instructions (Signed)
Continue same medications and return in 6 months or as needed.

## 2012-03-30 ENCOUNTER — Other Ambulatory Visit: Payer: Self-pay | Admitting: Internal Medicine

## 2012-03-30 ENCOUNTER — Encounter: Payer: Self-pay | Admitting: Internal Medicine

## 2012-03-30 ENCOUNTER — Ambulatory Visit (INDEPENDENT_AMBULATORY_CARE_PROVIDER_SITE_OTHER): Payer: 59 | Admitting: Internal Medicine

## 2012-03-30 VITALS — BP 122/82 | HR 76 | Temp 97.8°F | Wt 298.0 lb

## 2012-03-30 DIAGNOSIS — E785 Hyperlipidemia, unspecified: Secondary | ICD-10-CM

## 2012-03-30 DIAGNOSIS — I10 Essential (primary) hypertension: Secondary | ICD-10-CM

## 2012-03-30 DIAGNOSIS — G43909 Migraine, unspecified, not intractable, without status migrainosus: Secondary | ICD-10-CM

## 2012-03-30 DIAGNOSIS — E039 Hypothyroidism, unspecified: Secondary | ICD-10-CM

## 2012-03-30 DIAGNOSIS — Z8669 Personal history of other diseases of the nervous system and sense organs: Secondary | ICD-10-CM

## 2012-03-30 DIAGNOSIS — E669 Obesity, unspecified: Secondary | ICD-10-CM

## 2012-03-30 DIAGNOSIS — R7302 Impaired glucose tolerance (oral): Secondary | ICD-10-CM

## 2012-04-19 ENCOUNTER — Other Ambulatory Visit: Payer: Self-pay

## 2012-04-19 ENCOUNTER — Other Ambulatory Visit: Payer: Self-pay | Admitting: Internal Medicine

## 2012-05-15 NOTE — Progress Notes (Signed)
  Subjective:    Patient ID: [name], female    DOB: 1951/09/14, 61 y.o.   MRN: [id number]  HPI patient has history of migraine headaches but they have been fairly well controlled averaging about one every 3 months for while but gradually have gotten worse. Subsequently developed 2 migraines a month then migraines lasting several days at a time. Has never had triptan medication for migraine. Remains under a lot of stress with her mother's condition. Patient has many medical problems including obesity, colitis, depression, GE reflux, impaired glucose tolerance, hypothyroidism, hypertension, hyperlipidemia and asthma. She is on many medications. Does not know of any foods that trigger headaches. Sleep deprivation seems to aggravate them.   Review of Systems     Objective:   Physical Exam funduscopic exam shows a disc to be sharp and flat bilaterally. PERLA. Extraocular movements are full. No facial weakness. Cranial nerves II through XII are grossly intact. Muscle strength in the upper and lower extremities is normal. She is alert and oriented x3. Deep tendon reflexes 2+ and symmetrical. Muscle strength is 5 over 5 in all tested. Cerebellar finger to nose testing within normal limits.        Assessment & Plan:  Migraine headaches that have been getting worse and more frequent and lasting longer  Plan: Zomig nasal spray  Phenergan 25 mg tablets #30 one by mouth every 4 hours when necessary nausea. Consider prophylaxis with Topamax.  Try to get plenty of sleep. Food list given that could possibly trigger migraine. She is already on trazodone at bedtime which should help prevent migraine.

## 2012-05-16 ENCOUNTER — Encounter: Payer: Self-pay | Admitting: Internal Medicine

## 2012-05-16 NOTE — Patient Instructions (Addendum)
Keep headache diary. Uses Zomig nasal spray sparingly. Take Phenergan if needed for nausea. Return in several weeks for followup.

## 2012-07-17 ENCOUNTER — Other Ambulatory Visit: Payer: Self-pay | Admitting: Internal Medicine

## 2012-07-19 ENCOUNTER — Other Ambulatory Visit: Payer: Self-pay

## 2012-07-19 MED ORDER — ALPRAZOLAM 0.5 MG PO TABS: 0.5000 mg | ORAL_TABLET | Freq: Two times a day (BID) | ORAL | Status: DC | PRN

## 2012-08-09 ENCOUNTER — Encounter: Payer: Self-pay | Admitting: Internal Medicine

## 2012-08-09 ENCOUNTER — Ambulatory Visit (INDEPENDENT_AMBULATORY_CARE_PROVIDER_SITE_OTHER): Payer: 59 | Admitting: Internal Medicine

## 2012-08-09 VITALS — BP 110/70 | HR 84 | Temp 97.9°F | Wt 301.0 lb

## 2012-08-09 DIAGNOSIS — F329 Major depressive disorder, single episode, unspecified: Secondary | ICD-10-CM

## 2012-08-09 DIAGNOSIS — IMO0001 Reserved for inherently not codable concepts without codable children: Secondary | ICD-10-CM

## 2012-08-09 DIAGNOSIS — Z8719 Personal history of other diseases of the digestive system: Secondary | ICD-10-CM

## 2012-08-09 DIAGNOSIS — J45909 Unspecified asthma, uncomplicated: Secondary | ICD-10-CM

## 2012-08-09 DIAGNOSIS — M25562 Pain in left knee: Secondary | ICD-10-CM

## 2012-08-09 DIAGNOSIS — M791 Myalgia, unspecified site: Secondary | ICD-10-CM

## 2012-08-09 DIAGNOSIS — F419 Anxiety disorder, unspecified: Secondary | ICD-10-CM

## 2012-08-09 DIAGNOSIS — K219 Gastro-esophageal reflux disease without esophagitis: Secondary | ICD-10-CM

## 2012-08-09 DIAGNOSIS — G4700 Insomnia, unspecified: Secondary | ICD-10-CM

## 2012-08-09 DIAGNOSIS — I10 Essential (primary) hypertension: Secondary | ICD-10-CM

## 2012-08-09 DIAGNOSIS — G8929 Other chronic pain: Secondary | ICD-10-CM

## 2012-08-09 DIAGNOSIS — M7918 Myalgia, other site: Secondary | ICD-10-CM

## 2012-08-09 DIAGNOSIS — F32A Depression, unspecified: Secondary | ICD-10-CM

## 2012-08-09 DIAGNOSIS — M25559 Pain in unspecified hip: Secondary | ICD-10-CM

## 2012-08-09 DIAGNOSIS — F341 Dysthymic disorder: Secondary | ICD-10-CM

## 2012-08-09 DIAGNOSIS — E039 Hypothyroidism, unspecified: Secondary | ICD-10-CM

## 2012-08-09 DIAGNOSIS — E119 Type 2 diabetes mellitus without complications: Secondary | ICD-10-CM

## 2012-08-09 DIAGNOSIS — J309 Allergic rhinitis, unspecified: Secondary | ICD-10-CM

## 2012-08-09 NOTE — Patient Instructions (Addendum)
FMLA form has been completed for migraine headaches. Restart Zocor 10 mg daily. Return in 2 months for hemoglobin A1c fasting lipid panel and liver functions. CCP to rule out rheumatoid arthritis added to TSH and basic metabolic panel

## 2012-08-10 LAB — BASIC METABOLIC PANEL
CO2: 32 mEq/L (ref 19–32)
Calcium: 8.9 mg/dL (ref 8.4–10.5)
Chloride: 97 mEq/L (ref 96–112)
Creat: 0.94 mg/dL (ref 0.50–1.10)
Glucose, Bld: 106 mg/dL — ABNORMAL HIGH (ref 70–99)

## 2012-08-13 DIAGNOSIS — Z8719 Personal history of other diseases of the digestive system: Secondary | ICD-10-CM | POA: Insufficient documentation

## 2012-08-13 DIAGNOSIS — G4700 Insomnia, unspecified: Secondary | ICD-10-CM | POA: Insufficient documentation

## 2012-08-13 DIAGNOSIS — M7918 Myalgia, other site: Secondary | ICD-10-CM | POA: Insufficient documentation

## 2012-08-13 DIAGNOSIS — E119 Type 2 diabetes mellitus without complications: Secondary | ICD-10-CM | POA: Insufficient documentation

## 2012-08-13 NOTE — Progress Notes (Signed)
  Subjective:    Patient ID: [name], female    DOB: [birth year], 61 y.o.   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 She brings in an FMLA form today to be completed regarding absence due to migraine headaches. She's been having some joint pain which is concerning to her. She wonders if she has some type of collagen vascular disease. She does have a history of lichen planus in her mouth but that seems to be under better control over the past year or so. She has a history of hyperlipidemia and constipation. She is possible for the care of her elderly mother which is quite stressful. She also works full-time. Has not had a recent mammogram. Order given to her to have mammogram done. She will have influenza immunization done at employee health.  Patient says migraine headaches have improved somewhat. Not as frequent as they were.  Patient has a remote history of colitis 2009 with biopsy done by Dr. Joselyn Glassman showing ulcerated colonic mucosa in the descending colon. She is not on colitis medication. At some point she was thought to have B12 deficiency and is on oral B12. CBC done in February showed normal hemoglobin and normal MCV. There also is a remote history of iron deficiency. History of persistent lymphocytosis seen by hematologist in 2007 and thought to be benign.  In February she had a number of laboratory studies including hemoglobin A1c which was 6.2%. Fasting lipid panel which was normal except for an LDL of 1:30. She's not on statin medication. TSH at that time was normal. CBC and see them at work essentially normal.    Review of Systems     Objective:   Physical Exam neck is supple without thyromegaly or carotid bruits;  chest clear to auscultation; cardiac exam regular rate and rhythm normal S1 and S2. Extremities without pitting joint exam: No effusions or erythema of joints noted.        Assessment & Plan:  Migraine headaches  Hypothyroidism  Hyperlipidemia-patient currently not on Zocor 10 mg daily. Restart this medication. In 2 months she'll have fasting lipid panel, liver functions and hemoglobin A1c without office visit  Adult onset diabetes-type II diet controlled-recheck hemoglobin A1c in 2 months  Morbid obesity  Hypertension  Anxiety depression  Allergic rhinitis  GE reflux  Insomnia  Arthralgias    Plan: FMLA form completed in office today .  Return in 6 months. TSH done recently and was within normal limits. Hemoglobin A1c was not done at this appointment which can be followed up at next appointment.  Plan: Patient will have CCP done to rule out rheumatoid arthritis. Patient is to return in 2 months for hemoglobin A1c, fasting lipid panel and liver functions without office visit.

## 2012-09-18 ENCOUNTER — Encounter: Payer: Self-pay | Admitting: Internal Medicine

## 2012-09-18 ENCOUNTER — Ambulatory Visit (INDEPENDENT_AMBULATORY_CARE_PROVIDER_SITE_OTHER): Payer: 59 | Admitting: Internal Medicine

## 2012-09-18 VITALS — BP 134/80 | HR 76 | Temp 98.8°F | Wt 297.0 lb

## 2012-09-18 DIAGNOSIS — J069 Acute upper respiratory infection, unspecified: Secondary | ICD-10-CM

## 2012-09-18 MED ORDER — CEFTRIAXONE SODIUM 1 G IJ SOLR
1.0000 g | Freq: Once | INTRAMUSCULAR | Status: AC
  Administered 2012-09-18: 1 g via INTRAMUSCULAR

## 2012-09-18 NOTE — Patient Instructions (Addendum)
Take Levaquin 500 milligrams daily for 7 days. Take Hycodan 1 teaspoon by mouth every 6-8 hours as needed for cough. He had been given 1 g IM Rocephin and office today for bronchitis.

## 2012-09-18 NOTE — Progress Notes (Signed)
  Subjective:    Patient ID: [name], female    DOB: [birth year], 61 y.o.   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 Came down with acute chills but no documented fever on October 25. Developed cough and congestion. Has had nasal congestion as well. Some discolored sputum production but not a lot. No wheezing. Has malaise and fatigue. Coughing is becoming aggravating. Does not rest well at night. History of multiple medical problems.Has deep congested cough.    Review of Systems     Objective:   Physical Exam HEENT exam: TMs are full bilaterally but not red; pharynx slightly injected without exudate; neck is supple without adenopathy; chest clear without rales or wheezing; skin is warm and dry; alert and oriented x3        Assessment & Plan:  Bilateral serous otitis media  Bronchitis  Plan: 1 g IM Rocephin. Levaquin 500 milligrams daily for 7 days with one refill. Hycodan 8 ounces 1 teaspoon by mouth every 6 hours when necessary cough.

## 2012-12-04 ENCOUNTER — Encounter: Payer: Self-pay | Admitting: Internal Medicine

## 2012-12-04 ENCOUNTER — Ambulatory Visit (INDEPENDENT_AMBULATORY_CARE_PROVIDER_SITE_OTHER): Payer: 59 | Admitting: Internal Medicine

## 2012-12-04 ENCOUNTER — Ambulatory Visit
Admission: RE | Admit: 2012-12-04 | Discharge: 2012-12-04 | Disposition: A | Discharge status: Home / Self Care | Payer: 59 | Source: Ambulatory Visit | Attending: Internal Medicine | Admitting: Internal Medicine

## 2012-12-04 VITALS — BP 136/78 | HR 88 | Temp 98.2°F | Wt 296.0 lb

## 2012-12-04 DIAGNOSIS — R1032 Left lower quadrant pain: Secondary | ICD-10-CM

## 2012-12-04 DIAGNOSIS — R509 Fever, unspecified: Secondary | ICD-10-CM

## 2012-12-04 DIAGNOSIS — K5732 Diverticulitis of large intestine without perforation or abscess without bleeding: Secondary | ICD-10-CM

## 2012-12-04 LAB — CBC WITH DIFFERENTIAL/PLATELET
HCT: 40.7 % (ref 36.0–46.0)
Lymphocytes Relative: 28 % (ref 12–46)
Lymphs Abs: 3.5 10*3/uL (ref 0.7–4.0)
MCH: 27.6 pg (ref 26.0–34.0)
MCV: 85 fL (ref 78.0–100.0)
Monocytes Absolute: 0.4 10*3/uL (ref 0.1–1.0)
RDW: 12.8 % (ref 11.5–15.5)
WBC: 12.5 10*3/uL — ABNORMAL HIGH (ref 4.0–10.5)

## 2012-12-04 LAB — BASIC METABOLIC PANEL
Calcium: 10.2 mg/dL (ref 8.4–10.5)
Chloride: 98 mEq/L (ref 96–112)
Creat: 1.1 mg/dL (ref 0.50–1.10)
Sodium: 136 mEq/L (ref 135–145)

## 2012-12-04 LAB — POCT URINALYSIS DIPSTICK
Bilirubin, UA: NEGATIVE
Glucose, UA: NEGATIVE
Nitrite, UA: NEGATIVE
Urobilinogen, UA: NEGATIVE

## 2012-12-04 MED ORDER — IOHEXOL 300 MG/ML  SOLN
125.0000 mL | Freq: Once | INTRAMUSCULAR | Status: AC | PRN
  Administered 2012-12-04: 125 mL via INTRAVENOUS

## 2012-12-04 MED ORDER — IOHEXOL 300 MG/ML  SOLN
30.0000 mL | Freq: Once | INTRAMUSCULAR | Status: AC | PRN
  Administered 2012-12-04: 30 mL via ORAL

## 2012-12-04 NOTE — Progress Notes (Signed)
  Subjective:    Patient ID: [name], female    DOB: 19-May-1951, 62 y.o.   MRN: [id number]  HPI patient presents today with complaint of abdominal pain lower abdomen starting on Saturday afternoon, January 18. Last bowel movement was Friday evening January 17. She's had no nausea or vomiting. Has had decreased appetite. No urinary tract infection symptoms. Symptoms started abruptly. She has not sought medical treatment we'll female. Says she had temperature of 101 to 102 over the weekend and 100 yesterday. Complained of shaking chills. Has had influenza immunization through employment.  Has had CT of the abdomen and pelvis in 2011 and was found to have cholelithiasis and diverticulosis.    Review of Systems     Objective:   Physical Exam chest clear to auscultation. Skin is warm and dry. Abdomen is obese and nondistended. She is tender in her lower abdomen diffusely but more so in the left lower quadrant with rebound tenderness being present. Mild tenderness right lower quadrant without significant rebound. No hepatosplenomegaly        Assessment & Plan:  Acute sigmoid diverticulitis Possible allergy to contrast media  Plan: Flagyl 500 mg tid for 7 days. Cipro 500 mg bid for 10 days. Recheck next week. Clear liquids for 5-7 days then advance diet to soft when pain improves. See next week.  Complaining of itching of her nose after receiving IV contrast. No frank rash noted. If symptoms persist she may take Benadryl by mouth which she has at home and she also has an EpiPen.

## 2012-12-04 NOTE — Patient Instructions (Addendum)
Clear liquids for 5-7 days and then advance to soft diet. Return to clinic in one week. Take Cipro 500 mg by mouth twice a day for 10 days. Take Flagyl 500 mg by mouth 3 times a day for 7 days. Call if symptoms worsen. Out of work until seen next week.

## 2012-12-11 ENCOUNTER — Ambulatory Visit (INDEPENDENT_AMBULATORY_CARE_PROVIDER_SITE_OTHER): Payer: 59 | Admitting: Internal Medicine

## 2012-12-11 ENCOUNTER — Encounter: Payer: Self-pay | Admitting: Internal Medicine

## 2012-12-11 VITALS — BP 126/86 | Temp 98.1°F | Wt 294.0 lb

## 2012-12-11 DIAGNOSIS — K5732 Diverticulitis of large intestine without perforation or abscess without bleeding: Secondary | ICD-10-CM

## 2012-12-11 DIAGNOSIS — K5792 Diverticulitis of intestine, part unspecified, without perforation or abscess without bleeding: Secondary | ICD-10-CM

## 2012-12-11 LAB — CBC WITH DIFFERENTIAL/PLATELET
HCT: 40 % (ref 36.0–46.0)
Lymphocytes Relative: 37 % (ref 12–46)
MCHC: 34.2 g/dL (ref 30.0–36.0)
MCV: 84.7 fL (ref 78.0–100.0)
Monocytes Absolute: 0.9 10*3/uL (ref 0.1–1.0)
Monocytes Relative: 8 % (ref 3–12)
RDW: 13 % (ref 11.5–15.5)
WBC: 11.3 10*3/uL — ABNORMAL HIGH (ref 4.0–10.5)

## 2012-12-11 NOTE — Progress Notes (Signed)
Patient informed. 

## 2012-12-11 NOTE — Progress Notes (Signed)
  Subjective:    Patient ID: [name], female    DOB: July 11, 1951, 62 y.o.   MRN: [id number]  HPI  One week  follow up for sigmoid diverticulitis. She has been on Cipro and Flagyl for the past week. Flagyl may be making her nauseated. She has no appetite and still feels nauseated. No vomiting. Is staying hydrated with liquids. Voiding 3-4 times a day. Says tenderness has now moved more to the left lower quadrant rather than the entire lower abdomen. Has not been taking Phenergan as previously prescribed for nausea. Has been out of work since diagnosis. Has FMLA papers to be completed. Tried eating some macaroni and some grits a couple of days ago. Is worried that she has not gotten better. Explained that this could take some time.     Review of Systems     Objective:   Physical Exam abdomen is soft and nondistended. She is now more tender in her left lower quadrant than last exam when she had some generalized lower abdominal tenderness. There is mild tenderness in the left lower quadrant without rebound. CBC with differential was repeated. No significant leukocytosis.        Assessment & Plan:  Acute sigmoid diverticulitis-slow to improve  Nausea-? Secondary to Flagyl  Plan: Discontinue Flagyl. Continue Cipro. She is to complete a ten-day course of Cipro which will be within the next 3 days. Try Cleocin 300 mg every 6 hours for 7 days. Call tomorrow her progress report. Take Phenergan for nausea. Continue clear liquids. Remain out of work. FMLA form completed.  Addendum: 12/13/2011 patient called with progress report saying that she was no better than yesterday. Has only been on Cleocin for 24 hours. She seems extremely worried about lack of progress. Refer to general surgeon for evaluation later this week. Continue Cleocin and finish course of Cipro.

## 2012-12-12 ENCOUNTER — Encounter: Payer: Self-pay | Admitting: Internal Medicine

## 2012-12-12 NOTE — Patient Instructions (Addendum)
Discontinue Flagyl. Take Cleocin 300 mg every 6 hours for 7 days. Finish course of Cipro to complete a ten-day course. Take Phenergan for nausea.

## 2012-12-13 ENCOUNTER — Telehealth: Payer: Self-pay

## 2012-12-13 NOTE — Telephone Encounter (Signed)
Patient called yesterday with update on her condition. States feeling about the same- no better but no worse. After speaking with Dr. [name], she would like the patient to see a general surgeon. [name] is reluctant, wanting to wait a day or more to see if she doesn't improve. Dr. [name] informed.

## 2012-12-14 ENCOUNTER — Telehealth: Payer: Self-pay

## 2012-12-14 NOTE — Telephone Encounter (Signed)
Patient calls to inform us that she is feeling better today. Having less pain than when seen here Tuesday. Per Dr. [name], followup in one week from last ov. sched for 12/18/2012. Wants to return to work part-time on Monday

## 2012-12-14 NOTE — Telephone Encounter (Signed)
Ok per Dr. [name] for patient to attempt working 1/2 days for a while.

## 2012-12-18 ENCOUNTER — Ambulatory Visit (INDEPENDENT_AMBULATORY_CARE_PROVIDER_SITE_OTHER): Payer: 59 | Admitting: Internal Medicine

## 2012-12-18 ENCOUNTER — Encounter: Payer: Self-pay | Admitting: Internal Medicine

## 2012-12-18 VITALS — BP 120/76 | HR 72 | Temp 98.0°F | Wt 296.0 lb

## 2012-12-18 DIAGNOSIS — K5792 Diverticulitis of intestine, part unspecified, without perforation or abscess without bleeding: Secondary | ICD-10-CM

## 2012-12-18 DIAGNOSIS — K5732 Diverticulitis of large intestine without perforation or abscess without bleeding: Secondary | ICD-10-CM

## 2012-12-18 NOTE — Progress Notes (Signed)
  Subjective:    Patient ID: [name], female    DOB: 1951/09/02, 62 y.o.   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 Initially treated with Cipro and Flagyl. We saw her in followup on January 28 and she was not significantly improved and was feeling nauseated. Nausea could of been due to Flagyl. She was not taking Phenergan for nausea as prescribed. I asked her to take the Phenergan and switched her from Flagyl to clindamycin 300 mg every 6 hours which she has been taking without problems. She called last week and wanted to return to work this week  early just to do paperwork half days. We also completed FMLA form for her last week. We allowed her to return to work yesterday half day. She was tired by the end of her half-day of work. She went to work today for half-day and is going to again feeling tired. She looks a bit fatigued today. Says she feels less tender. She still on a soft diet.    Review of Systems     Objective:   Physical Exam abdomen obese nondistended without hepatosplenomegaly masses or significant tenderness        Assessment & Plan:  Acute sigmoid diverticulitis-improving  Plan: Finish course of clindamycin. Patient is seeing Dr. [name] in the past for colonoscopy. In 2 months, she will see him once again for followup of diverticulitis. He will determine if she needs another colonoscopy at this point in time. She will continue to advance her diet slowly.

## 2012-12-18 NOTE — Patient Instructions (Addendum)
Finish course of clindamycin. Advance diet slowly. We will make an appointment for you to see Dr. [name] in 2 months

## 2012-12-19 ENCOUNTER — Telehealth: Payer: Self-pay

## 2012-12-19 NOTE — Telephone Encounter (Signed)
Patient scheduled for an appointment with Dr. [name] on 02/19/2013 at 10:45am. She is aware of this .

## 2012-12-19 NOTE — Addendum Note (Signed)
Addended by: Judy Pimple on: 12/19/2012 10:26 AM   Modules accepted: Orders

## 2013-01-26 ENCOUNTER — Encounter (HOSPITAL_BASED_OUTPATIENT_CLINIC_OR_DEPARTMENT_OTHER): Payer: Self-pay | Admitting: *Deleted

## 2013-01-26 ENCOUNTER — Inpatient Hospital Stay (HOSPITAL_BASED_OUTPATIENT_CLINIC_OR_DEPARTMENT_OTHER)
Admission: EM | Admit: 2013-01-26 | Discharge: 2013-01-30 | DRG: 392 | Disposition: A | Discharge status: Home / Self Care | Payer: 59 | Attending: Internal Medicine | Admitting: Internal Medicine

## 2013-01-26 ENCOUNTER — Emergency Department (HOSPITAL_BASED_OUTPATIENT_CLINIC_OR_DEPARTMENT_OTHER): Payer: 59

## 2013-01-26 DIAGNOSIS — Z8719 Personal history of other diseases of the digestive system: Secondary | ICD-10-CM

## 2013-01-26 DIAGNOSIS — K5730 Diverticulosis of large intestine without perforation or abscess without bleeding: Secondary | ICD-10-CM

## 2013-01-26 DIAGNOSIS — F329 Major depressive disorder, single episode, unspecified: Secondary | ICD-10-CM

## 2013-01-26 DIAGNOSIS — G4700 Insomnia, unspecified: Secondary | ICD-10-CM

## 2013-01-26 DIAGNOSIS — E119 Type 2 diabetes mellitus without complications: Secondary | ICD-10-CM

## 2013-01-26 DIAGNOSIS — E039 Hypothyroidism, unspecified: Secondary | ICD-10-CM | POA: Diagnosis present

## 2013-01-26 DIAGNOSIS — J45909 Unspecified asthma, uncomplicated: Secondary | ICD-10-CM

## 2013-01-26 DIAGNOSIS — M7918 Myalgia, other site: Secondary | ICD-10-CM

## 2013-01-26 DIAGNOSIS — K219 Gastro-esophageal reflux disease without esophagitis: Secondary | ICD-10-CM | POA: Diagnosis present

## 2013-01-26 DIAGNOSIS — K5732 Diverticulitis of large intestine without perforation or abscess without bleeding: Principal | ICD-10-CM

## 2013-01-26 DIAGNOSIS — Z792 Long term (current) use of antibiotics: Secondary | ICD-10-CM

## 2013-01-26 DIAGNOSIS — Z7982 Long term (current) use of aspirin: Secondary | ICD-10-CM

## 2013-01-26 DIAGNOSIS — Z8669 Personal history of other diseases of the nervous system and sense organs: Secondary | ICD-10-CM

## 2013-01-26 DIAGNOSIS — K5792 Diverticulitis of intestine, part unspecified, without perforation or abscess without bleeding: Secondary | ICD-10-CM

## 2013-01-26 DIAGNOSIS — Z90710 Acquired absence of both cervix and uterus: Secondary | ICD-10-CM

## 2013-01-26 DIAGNOSIS — Z87891 Personal history of nicotine dependence: Secondary | ICD-10-CM

## 2013-01-26 DIAGNOSIS — Z79899 Other long term (current) drug therapy: Secondary | ICD-10-CM

## 2013-01-26 DIAGNOSIS — Z6841 Body Mass Index (BMI) 40.0 and over, adult: Secondary | ICD-10-CM

## 2013-01-26 DIAGNOSIS — L439 Lichen planus, unspecified: Secondary | ICD-10-CM

## 2013-01-26 DIAGNOSIS — E538 Deficiency of other specified B group vitamins: Secondary | ICD-10-CM

## 2013-01-26 DIAGNOSIS — Z885 Allergy status to narcotic agent status: Secondary | ICD-10-CM

## 2013-01-26 DIAGNOSIS — E785 Hyperlipidemia, unspecified: Secondary | ICD-10-CM

## 2013-01-26 DIAGNOSIS — Z881 Allergy status to other antibiotic agents status: Secondary | ICD-10-CM

## 2013-01-26 DIAGNOSIS — Z888 Allergy status to other drugs, medicaments and biological substances status: Secondary | ICD-10-CM

## 2013-01-26 DIAGNOSIS — I10 Essential (primary) hypertension: Secondary | ICD-10-CM | POA: Diagnosis present

## 2013-01-26 DIAGNOSIS — E876 Hypokalemia: Secondary | ICD-10-CM

## 2013-01-26 HISTORY — DX: Diverticulosis of intestine, part unspecified, without perforation or abscess without bleeding: K57.90

## 2013-01-26 HISTORY — DX: Diverticulitis of intestine, part unspecified, without perforation or abscess without bleeding: K57.92

## 2013-01-26 LAB — URINALYSIS, ROUTINE W REFLEX MICROSCOPIC
Bilirubin Urine: NEGATIVE
Ketones, ur: NEGATIVE mg/dL
Nitrite: NEGATIVE
Protein, ur: NEGATIVE mg/dL
Specific Gravity, Urine: 1.014 (ref 1.005–1.030)
Urobilinogen, UA: 0.2 mg/dL (ref 0.0–1.0)

## 2013-01-26 LAB — CBC WITH DIFFERENTIAL/PLATELET
Basophils Absolute: 0.1 10*3/uL (ref 0.0–0.1)
Basophils Relative: 1 % (ref 0–1)
MCHC: 32.7 g/dL (ref 30.0–36.0)
Neutro Abs: 6.7 10*3/uL (ref 1.7–7.7)
Neutrophils Relative %: 51 % (ref 43–77)
Platelets: 295 10*3/uL (ref 150–400)
RDW: 14.1 % (ref 11.5–15.5)
WBC: 13 10*3/uL — ABNORMAL HIGH (ref 4.0–10.5)

## 2013-01-26 LAB — COMPREHENSIVE METABOLIC PANEL
ALT: 17 U/L (ref 0–35)
AST: 18 U/L (ref 0–37)
Albumin: 3.4 g/dL — ABNORMAL LOW (ref 3.5–5.2)
Alkaline Phosphatase: 89 U/L (ref 39–117)
Chloride: 100 mEq/L (ref 96–112)
Creatinine, Ser: 0.8 mg/dL (ref 0.50–1.10)
Potassium: 3.3 mEq/L — ABNORMAL LOW (ref 3.5–5.1)
Sodium: 140 mEq/L (ref 135–145)
Total Bilirubin: 0.3 mg/dL (ref 0.3–1.2)

## 2013-01-26 MED ORDER — LEVOTHYROXINE SODIUM 175 MCG PO TABS
175.0000 ug | ORAL_TABLET | Freq: Every day | ORAL | Status: DC
  Administered 2013-01-27 – 2013-01-30 (×4): 175 ug via ORAL
  Filled 2013-01-26 (×6): qty 1

## 2013-01-26 MED ORDER — HYDROCODONE-ACETAMINOPHEN 5-325 MG PO TABS: 1.0000 | ORAL_TABLET | ORAL | Status: DC | PRN

## 2013-01-26 MED ORDER — SIMVASTATIN 10 MG PO TABS
10.0000 mg | ORAL_TABLET | Freq: Every day | ORAL | Status: DC
  Administered 2013-01-26 – 2013-01-29 (×4): 10 mg via ORAL
  Filled 2013-01-26 (×5): qty 1

## 2013-01-26 MED ORDER — DOCUSATE SODIUM 100 MG PO CAPS
100.0000 mg | ORAL_CAPSULE | Freq: Two times a day (BID) | ORAL | Status: DC
  Administered 2013-01-26 – 2013-01-29 (×7): 100 mg via ORAL
  Filled 2013-01-26 (×7): qty 1

## 2013-01-26 MED ORDER — SODIUM CHLORIDE 0.9 % IV BOLUS (SEPSIS)
1000.0000 mL | Freq: Once | INTRAVENOUS | Status: AC
  Administered 2013-01-26: 1000 mL via INTRAVENOUS

## 2013-01-26 MED ORDER — ALPRAZOLAM 0.5 MG PO TABS: 0.5000 mg | ORAL_TABLET | Freq: Two times a day (BID) | ORAL | Status: DC | PRN

## 2013-01-26 MED ORDER — ONDANSETRON HCL 4 MG/2ML IJ SOLN: 4.0000 mg | Freq: Three times a day (TID) | INTRAMUSCULAR | Status: AC | PRN

## 2013-01-26 MED ORDER — SODIUM CHLORIDE 0.9 % IV SOLN
INTRAVENOUS | Status: AC
  Administered 2013-01-26: 23:00:00 via INTRAVENOUS

## 2013-01-26 MED ORDER — PIPERACILLIN-TAZOBACTAM 3.375 G IVPB 30 MIN
3.3750 g | Freq: Once | INTRAVENOUS | Status: AC
  Administered 2013-01-26: 3.375 g via INTRAVENOUS
  Filled 2013-01-26 (×2): qty 50

## 2013-01-26 MED ORDER — MOMETASONE FURO-FORMOTEROL FUM 100-5 MCG/ACT IN AERO
2.0000 | INHALATION_SPRAY | Freq: Two times a day (BID) | RESPIRATORY_TRACT | Status: DC
  Administered 2013-01-27 – 2013-01-30 (×7): 2 via RESPIRATORY_TRACT
  Filled 2013-01-26: qty 8.8

## 2013-01-26 MED ORDER — METOPROLOL TARTRATE 25 MG PO TABS
25.0000 mg | ORAL_TABLET | Freq: Every morning | ORAL | Status: DC
  Administered 2013-01-27 – 2013-01-29 (×3): 25 mg via ORAL
  Filled 2013-01-26 (×4): qty 1

## 2013-01-26 MED ORDER — ALBUTEROL SULFATE (5 MG/ML) 0.5% IN NEBU: 2.5000 mg | INHALATION_SOLUTION | RESPIRATORY_TRACT | Status: DC | PRN

## 2013-01-26 MED ORDER — FENTANYL CITRATE 0.05 MG/ML IJ SOLN
INTRAMUSCULAR | Status: AC
  Administered 2013-01-26: 50 ug via INTRAVENOUS
  Filled 2013-01-26: qty 2

## 2013-01-26 MED ORDER — TRAZODONE HCL 150 MG PO TABS
150.0000 mg | ORAL_TABLET | Freq: Every day | ORAL | Status: DC
  Administered 2013-01-26 – 2013-01-29 (×4): 150 mg via ORAL
  Filled 2013-01-26 (×5): qty 1

## 2013-01-26 MED ORDER — IOHEXOL 300 MG/ML  SOLN
100.0000 mL | Freq: Once | INTRAMUSCULAR | Status: AC | PRN
  Administered 2013-01-26: 100 mL via INTRAVENOUS

## 2013-01-26 MED ORDER — POTASSIUM CHLORIDE CRYS ER 20 MEQ PO TBCR
40.0000 meq | EXTENDED_RELEASE_TABLET | Freq: Once | ORAL | Status: AC
  Administered 2013-01-26: 40 meq via ORAL
  Filled 2013-01-26: qty 2

## 2013-01-26 MED ORDER — ONDANSETRON HCL 4 MG/2ML IJ SOLN
4.0000 mg | Freq: Four times a day (QID) | INTRAMUSCULAR | Status: DC | PRN
  Administered 2013-01-27 (×3): 4 mg via INTRAVENOUS
  Filled 2013-01-26 (×3): qty 2

## 2013-01-26 MED ORDER — ASPIRIN 81 MG PO CHEW
81.0000 mg | CHEWABLE_TABLET | Freq: Every day | ORAL | Status: DC
  Administered 2013-01-26 – 2013-01-29 (×4): 81 mg via ORAL
  Filled 2013-01-26 (×5): qty 1

## 2013-01-26 MED ORDER — ACETAMINOPHEN 325 MG PO TABS
650.0000 mg | ORAL_TABLET | Freq: Four times a day (QID) | ORAL | Status: DC | PRN
  Administered 2013-01-27: 650 mg via ORAL
  Filled 2013-01-26: qty 2

## 2013-01-26 MED ORDER — FENTANYL CITRATE 0.05 MG/ML IJ SOLN: 50.0000 ug | Freq: Once | INTRAMUSCULAR | Status: AC

## 2013-01-26 MED ORDER — FAMOTIDINE 20 MG PO TABS
20.0000 mg | ORAL_TABLET | Freq: Every day | ORAL | Status: DC
  Administered 2013-01-26 – 2013-01-29 (×4): 20 mg via ORAL
  Filled 2013-01-26 (×5): qty 1

## 2013-01-26 MED ORDER — ONDANSETRON HCL 4 MG PO TABS: 4.0000 mg | ORAL_TABLET | Freq: Four times a day (QID) | ORAL | Status: DC | PRN

## 2013-01-26 MED ORDER — LORATADINE 10 MG PO TABS
10.0000 mg | ORAL_TABLET | Freq: Every day | ORAL | Status: DC
  Administered 2013-01-26 – 2013-01-29 (×4): 10 mg via ORAL
  Filled 2013-01-26 (×5): qty 1

## 2013-01-26 MED ORDER — DIPHENHYDRAMINE HCL 25 MG PO TABS
25.0000 mg | ORAL_TABLET | Freq: Every day | ORAL | Status: DC | PRN
  Administered 2013-01-28: 25 mg via ORAL
  Filled 2013-01-26 (×2): qty 1

## 2013-01-26 MED ORDER — HYDROMORPHONE HCL PF 1 MG/ML IJ SOLN
0.5000 mg | INTRAMUSCULAR | Status: DC | PRN
  Administered 2013-01-26 – 2013-01-29 (×7): 1 mg via INTRAVENOUS
  Filled 2013-01-26 (×7): qty 1

## 2013-01-26 MED ORDER — PIPERACILLIN-TAZOBACTAM 3.375 G IVPB
3.3750 g | Freq: Three times a day (TID) | INTRAVENOUS | Status: DC
  Administered 2013-01-27 – 2013-01-29 (×7): 3.375 g via INTRAVENOUS
  Filled 2013-01-26 (×10): qty 50

## 2013-01-26 MED ORDER — LOSARTAN POTASSIUM 50 MG PO TABS
50.0000 mg | ORAL_TABLET | Freq: Every day | ORAL | Status: DC
  Administered 2013-01-26 – 2013-01-29 (×4): 50 mg via ORAL
  Filled 2013-01-26 (×5): qty 1

## 2013-01-26 MED ORDER — SODIUM CHLORIDE 0.9 % IV SOLN: INTRAVENOUS | Status: DC

## 2013-01-26 MED ORDER — IOHEXOL 300 MG/ML  SOLN
50.0000 mL | Freq: Once | INTRAMUSCULAR | Status: AC | PRN
  Administered 2013-01-26: 50 mL via ORAL

## 2013-01-26 MED ORDER — INSULIN ASPART 100 UNIT/ML ~~LOC~~ SOLN
0.0000 [IU] | SUBCUTANEOUS | Status: DC
  Administered 2013-01-28: 1 [IU] via SUBCUTANEOUS

## 2013-01-26 MED ORDER — ASPIRIN 81 MG PO TABS: 81.0000 mg | ORAL_TABLET | Freq: Every day | ORAL | Status: DC

## 2013-01-26 MED ORDER — ACETAMINOPHEN 650 MG RE SUPP: 650.0000 mg | Freq: Four times a day (QID) | RECTAL | Status: DC | PRN

## 2013-01-26 MED ORDER — PANTOPRAZOLE SODIUM 40 MG IV SOLR
40.0000 mg | Freq: Every day | INTRAVENOUS | Status: DC
  Administered 2013-01-26 – 2013-01-27 (×2): 40 mg via INTRAVENOUS
  Filled 2013-01-26 (×4): qty 40

## 2013-01-26 MED ORDER — METRONIDAZOLE IN NACL 5-0.79 MG/ML-% IV SOLN
500.0000 mg | Freq: Once | INTRAVENOUS | Status: AC
  Administered 2013-01-26: 500 mg via INTRAVENOUS
  Filled 2013-01-26: qty 100

## 2013-01-26 MED ORDER — HYOSCYAMINE SULFATE ER 0.375 MG PO TB12
0.3750 mg | ORAL_TABLET | Freq: Every morning | ORAL | Status: DC
  Administered 2013-01-27 – 2013-01-29 (×3): 0.375 mg via ORAL
  Filled 2013-01-26 (×4): qty 1

## 2013-01-26 MED ORDER — ALBUTEROL SULFATE HFA 108 (90 BASE) MCG/ACT IN AERS
2.0000 | INHALATION_SPRAY | Freq: Four times a day (QID) | RESPIRATORY_TRACT | Status: DC | PRN
  Filled 2013-01-26: qty 6.7

## 2013-01-26 MED ORDER — ZOLPIDEM TARTRATE 5 MG PO TABS
5.0000 mg | ORAL_TABLET | Freq: Every day | ORAL | Status: DC
  Administered 2013-01-26 – 2013-01-29 (×4): 5 mg via ORAL
  Filled 2013-01-26 (×4): qty 1

## 2013-01-26 MED ORDER — ZILEUTON ER 600 MG PO TB12: 1200.0000 mg | ORAL_TABLET | Freq: Two times a day (BID) | ORAL | Status: DC

## 2013-01-26 MED ORDER — ENOXAPARIN SODIUM 40 MG/0.4ML ~~LOC~~ SOLN
40.0000 mg | SUBCUTANEOUS | Status: DC
  Administered 2013-01-26 – 2013-01-29 (×4): 40 mg via SUBCUTANEOUS
  Filled 2013-01-26 (×5): qty 0.4

## 2013-01-26 NOTE — H&P (Signed)
PCP:  [name], MD  [facility]  Chief Complaint:   Abdominal pain  HPI: [name] is a 62 y.o. female   has a past medical history of Thyroid disease; Migraine; Diabetes mellitus; Asthma; Diverticulitis; and Diverticulosis.   Presented with  1 month ago patient was diagnosed with Diverticulitis and treated with PO clindamycin, she has severe allergies to flouroquinolones. At first her symptoms have improved but 1 week ago they have recurred. She developed again LLQ sharp pains and presented to [facility]. Patient does not endorse any nausea or diarrhea. No BM for the past 2 days that is typical for her.  CT scan done at [facility] showed sigmoid diverticulitis without perforation and she was started on Flagyl and  Zosyn and transferred to [facility] med-surge bed. Patient have not had any fevers.   She does state that 5 years ago she may have had an episode of ischemic colitis but it was not confirmed.  She has hx of diet controlled diabetes.  Her asthma is followed by allergist and is currently under good control.   Review of Systems:    Pertinent positives include: abdominal pain, constipation  Constitutional:  No weight loss, night sweats, Fevers, chills, fatigue, weight loss  HEENT:  No headaches, Difficulty swallowing,Tooth/dental problems,Sore throat,  No sneezing, itching, ear ache, nasal congestion, post nasal drip,  Cardio-vascular:  No chest pain, Orthopnea, PND, anasarca, dizziness, palpitations.no Bilateral lower extremity swelling  GI:  No heartburn, indigestion, nausea, vomiting, diarrhea, change in bowel habits, loss of appetite, melena, blood in stool, hematemesis Resp:  no shortness of breath at rest. No dyspnea on exertion, No excess mucus, no productive cough, No non-productive cough, No coughing up of blood.No change in color of mucus.No wheezing. Skin:  no rash or lesions. No jaundice GU:  no dysuria, change in color of urine, no urgency or frequency. No straining to  urinate.  No flank pain.  Musculoskeletal:  No joint pain or no joint swelling. No decreased range of motion. No back pain.  Psych:  No change in mood or affect. No depression or anxiety. No memory loss.  Neuro: no localizing neurological complaints, no tingling, no weakness, no double vision, no gait abnormality, no slurred speech, no confusion  Otherwise ROS are negative except for above, 10 systems were reviewed  Past Medical History: Past Medical History  Diagnosis Date  . Thyroid disease   . Migraine   . Diabetes mellitus   . Asthma   . Diverticulitis   . Diverticulosis    Past Surgical History  Procedure Laterality Date  . Cesarean section    . Abdominal hysterectomy    . Knee cartilage surgery      Left  . Cataract extraction, bilateral       Medications: Prior to Admission medications   Medication Sig Start Date End Date Taking? Authorizing Provider  esomeprazole (NEXIUM) 40 MG capsule Take 40 mg by mouth 2 (two) times daily.   Yes Historical Provider, MD  furosemide (LASIX) 40 MG tablet Take 40-80 mg by mouth daily.   Yes Historical Provider, MD  losartan (COZAAR) 50 MG tablet Take 50 mg by mouth daily.   Yes Historical Provider, MD  metoprolol tartrate (LOPRESSOR) 25 MG tablet Take 25 mg by mouth daily.   Yes Historical Provider, MD  traZODone (DESYREL) 150 MG tablet Take 150 mg by mouth at bedtime.   Yes Historical Provider, MD  zolpidem (AMBIEN) 10 MG tablet Take 10 mg by mouth at bedtime.  Yes Historical Provider, MD  ALPRAZolam Prudy Feeler) 0.5 MG tablet Take 1 tablet (0.5 mg total) by mouth 2 (two) times daily as needed for sleep. 07/19/12   [name], MD  aspirin 81 MG tablet Take 81 mg by mouth daily.      Historical Provider, MD  aspirin-acetaminophen-caffeine (EXCEDRIN MIGRAINE) [phone] MG per tablet Take 1 tablet by mouth every 6 (six) hours as needed.    Historical Provider, MD  cetirizine (ZYRTEC) 10 MG tablet Take 10 mg by mouth daily.      Historical  Provider, MD  Cholecalciferol (VITAMIN D) 2000 UNITS tablet Take 2,000 Units by mouth daily.      Historical Provider, MD  clindamycin (CLEOCIN) 150 MG capsule Take 150 mg by mouth 4 (four) times daily.    Historical Provider, MD  famotidine (PEPCID) 20 MG tablet Take 20 mg by mouth at bedtime.      Historical Provider, MD  Fluticasone-Salmeterol (ADVAIR) 250-50 MCG/DOSE AEPB Inhale 1 puff into the lungs every 12 (twelve) hours.    Historical Provider, MD  hyoscyamine (LEVBID) 0.375 MG 12 hr tablet Take 0.375 mg by mouth every 12 (twelve) hours as needed.    Historical Provider, MD  levothyroxine (SYNTHROID) 175 MCG tablet Take 175 mcg by mouth daily.  12/05/11 05/04/13  [name], MD  vitamin B-12 (CYANOCOBALAMIN) 100 MCG tablet Take 100 mcg by mouth daily.      Historical Provider, MD  ZYFLO CR 600 MG CR tablet TAKE 2 TABLETS BY MOUTH TWICE DAILY 07/17/12   [name], MD    Allergies:   Allergies  Allergen Reactions  . Levofloxacin Anaphylaxis    Possibly Levaquin or Lisinopril, pt was taking both at the time of reaction  . Lisinopril Anaphylaxis    Possibly Lisinopril or Levaquin, pt was taking both at the time of reaction  . Bee Venom Swelling  . 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 She does not have any smokeless tobacco history on file. She reports that she does not drink alcohol or use illicit drugs.   Family History: family history is not on file.    Physical Exam: Patient Vitals for the past 24 hrs:  BP Temp Temp src Pulse Resp SpO2 Height Weight  01/26/13 2003 147/69 mmHg 98 F (36.7 C) Oral 89 18 96 % 5\' 5"  (1.651 m) 136.4 kg (300 lb 11.3 oz)  01/26/13 1939 147/69 mmHg 98.2 F (36.8 C) - 85 18 96 % - -  01/26/13 1823 159/84 mmHg - - 82 18 - - -  01/26/13 1345 141/73 mmHg 97.6 F (36.4 C) Oral 99 18 99 % 5\' 5"   (1.651 m) 129.275 kg (285 lb)    1. General:  in No Acute distress 2. Psychological: Alert and   Oriented 3. Head/ENT:    Dry Mucous Membranes                          Head Non traumatic, neck supple                          Normal   Dentition 4. SKIN: normal   Skin turgor,  Skin clean Dry and intact no rash 5. Heart: Regular rate and rhythm  no Murmur, Rub or gallop 6. Lungs: Clear to auscultation bilaterally, no wheezes or crackles  distant 7. Abdomen: Soft,  LLQ tenderness, no peritoneal signs, Non distended, obese 8. Lower extremities: no clubbing, cyanosis, or edema, obese 9. Neurologically Grossly intact, moving all 4 extremities equally 10. MSK: Normal range of motion  body mass index is 50.04 kg/(m^2).   Labs on Admission:   Recent Labs  01/26/13 1415  NA 140  K 3.3*  CL 100  CO2 30  GLUCOSE 118*  BUN 11  CREATININE 0.80  CALCIUM 9.2    Recent Labs  01/26/13 1415  AST 18  ALT 17  ALKPHOS 89  BILITOT 0.3  PROT 7.1  ALBUMIN 3.4*   No results found for this basename: LIPASE, AMYLASE,  in the last 72 hours  Recent Labs  01/26/13 1415  WBC 13.0*  NEUTROABS 6.7  HGB 12.8  HCT 39.1  MCV 86.1  PLT 295   No results found for this basename: CKTOTAL, CKMB, CKMBINDEX, TROPONINI,  in the last 72 hours No results found for this basename: TSH, T4TOTAL, FREET3, T3FREE, THYROIDAB,  in the last 72 hours No results found for this basename: VITAMINB12, FOLATE, FERRITIN, TIBC, IRON, RETICCTPCT,  in the last 72 hours Lab Results  Component Value Date   HGBA1C 6.2* 12/20/2011    Estimated Creatinine Clearance: 104.8 ml/min (by C-G formula based on Cr of 0.8). ABG    Component Value Date/Time   TCO2 29 04/11/2008 0812    UA - no evidence of infection      Radiological Exams on Admission: Ct Abdomen Pelvis W Contrast  01/26/2013  *RADIOLOGY REPORT*  Clinical Data: 62 year old female with abdominal and pelvic pain. History of recent diverticulitis.  CT ABDOMEN  AND PELVIS WITH CONTRAST  Technique:  Multidetector CT imaging of the abdomen and pelvis was performed following the standard protocol during bolus administration of intravenous contrast.  Contrast: 50 ml intravenous Omnipaque-300  Comparison: 12/04/2012 and prior CTs dating back to 06/10/2006  Findings: Mild fatty infiltration of the liver is again identified. No focal hepatic abnormalities are present. At least three gallstones are present, the largest measuring 2 cm. No CT evidence of acute cholecystitis identified. The spleen, pancreas, adrenal glands and kidneys are unremarkable except for A right renal cyst and five punctate nonobstructing mid renal calculus.  There is wall thickening inflammation of the proximal sigmoid colon compatible with diverticulitis and increased since the prior study. There is no evidence of focal abscess.  No gross evidence of pneumoperitoneum is identified.  There is no evidence of free fluid, abdominal aortic aneurysm, biliary dilatation or enlarged lymph nodes. The patient is status post hysterectomy. The bladder and appendix are unremarkable.  No acute or suspicious bony abnormalities are identified.  IMPRESSION: Moderate acute proximal sigmoid colonic diverticulitis without abscess or gross pneumoperitoneum.  This has increased since 12/04/2012.  Cholelithiasis without CT evidence of acute cholecystitis.  Fatty infiltration of the liver.   Original Report Authenticated By: Harmon Pier, M.D.     Chart has been reviewed  Assessment/Plan  62 yo F w hx of recent bout of diverticultitis with failure of OP treatment admitted for IV antibiotics  Present on Admission:  . Diverticulitis - admit for IV antibiotics, continue Zosyn and stop flagyl as Zosyn should have anaerobe coverage. Bowel rest. IVF, if patient continues to have recurrent episodes a referral to  surgery may be needed. . Type 2 diabetes mellitus - SSI, check hg A1c . HYPERTENSION -  continue home medications,  stable . ASTHMA - continue home medications and PRN albuterol, stable . Hypokalemia - will replace . HYPOTHYROIDISM -  Continue synthroid and check TSH . GERD - on protonix   Prophylaxis:  Lovenox, Protonix  CODE STATUS: FULL CODE as per patient's wishes  Other plan as per orders.  I have spent a total of 55 min on this admission  Harshan Kearley 01/26/2013, 8:30 PM

## 2013-01-26 NOTE — ED Provider Notes (Signed)
History     CSN: [id number]  Arrival date & time 01/26/13  1341   First MD Initiated Contact with Patient 01/26/13 1345      Chief Complaint  Patient presents with  . Abdominal Pain    (Consider location/radiation/quality/duration/timing/severity/associated sxs/prior treatment) Patient is a 62 y.o. female presenting with abdominal pain.  Abdominal Pain  Pt with history of diverticulitis was treated for same about 2 months ago at PCP office following CT which showed uncomplicated diverticulitis. She was doing better until about 5 days ago when she began to have vague aching LLQ pain, associated with decreased appetite, but no fever, vomiting, diarrhea or dysuria. Pain waxes and wanes, occasionally has a sharp, severe pain particularly with certain movements.  Dull pain is persistent.   Past Medical History  Diagnosis Date  . Thyroid disease   . Migraine   . Diabetes mellitus   . Asthma   . Diverticulitis   . Diverticulosis     Past Surgical History  Procedure Laterality Date  . Cesarean section    . Abdominal hysterectomy    . Knee cartilage surgery      Left  . Cataract extraction, bilateral      History reviewed. No pertinent family history.  History  Substance Use Topics  . Smoking status: Former Games developer  . Smokeless tobacco: Not on file     Comment: 25 years ago  . Alcohol Use: No    OB History   Grav Para Term Preterm Abortions TAB SAB Ect Mult Living                  Review of Systems  Gastrointestinal: Positive for abdominal pain.   All other systems reviewed and are negative except as noted in HPI.   Allergies  Levofloxacin; Lisinopril; Bee venom; and Morphine and related  Home Medications   Current Outpatient Rx  Name  Route  Sig  Dispense  Refill  . acetaminophen (TYLENOL) 325 MG tablet   Oral   Take 650 mg by mouth every 6 (six) hours as needed. For pain          . Albuterol (VENTOLIN IN)   Inhalation   Inhale 2 puffs into the lungs  every 4 (four) hours as needed. For shortness of breath          . ALPRAZolam (XANAX) 0.5 MG tablet   Oral   Take 1 tablet (0.5 mg total) by mouth 2 (two) times daily as needed for sleep.   180 tablet   1   . aspirin 81 MG tablet   Oral   Take 81 mg by mouth daily.           Marland Kitchen aspirin-acetaminophen-caffeine (EXCEDRIN MIGRAINE) 250-250-65 MG per tablet   Oral   Take 1 tablet by mouth every 6 (six) hours as needed.         . cetirizine (ZYRTEC) 10 MG tablet   Oral   Take 10 mg by mouth daily.           . Cholecalciferol (VITAMIN D) 2000 UNITS tablet   Oral   Take 2,000 Units by mouth daily.           . clindamycin (CLEOCIN) 150 MG capsule   Oral   Take 150 mg by mouth 4 (four) times daily.         . famotidine (PEPCID) 20 MG tablet   Oral   Take 20 mg by mouth at bedtime.           Marland Kitchen  fluticasone (FLONASE) 50 MCG/ACT nasal spray   Nasal   Place 2 sprays into the nose daily.           . Fluticasone-Salmeterol (ADVAIR) 250-50 MCG/DOSE AEPB   Inhalation   Inhale 1 puff into the lungs every 12 (twelve) hours.         . furosemide (LASIX) 40 MG tablet      TAKE 1 TO 2 TABLETS BY MOUTH EVERY DAY   180 tablet   PRN   . hyoscyamine (LEVBID) 0.375 MG 12 hr tablet   Oral   Take 0.375 mg by mouth every 12 (twelve) hours as needed.         Marland Kitchen levothyroxine (SYNTHROID) 175 MCG tablet               . losartan (COZAAR) 50 MG tablet      TAKE 1 TABLET BY MOUTH ONCE DAILY FOR HYPERTENSION   90 tablet   3   . metoprolol tartrate (LOPRESSOR) 25 MG tablet      TAKE 1 TABLET BY MOUTH EVERY DAY   90 tablet   PRN   . NEXIUM 40 MG capsule      TAKE 1 CAPSULE BY MOUTH 2 TIMES DAILY.   180 capsule   1   . traZODone (DESYREL) 150 MG tablet      TAKE 1 TABLET BY MOUTH AT BEDTIME   90 tablet   PRN   . vitamin B-12 (CYANOCOBALAMIN) 100 MCG tablet   Oral   Take 100 mcg by mouth daily.           Marland Kitchen zolpidem (AMBIEN) 10 MG tablet      TAKE 1  TABLET BY MOUTH DAILY   90 tablet   1   . ZYFLO CR 600 MG CR tablet      TAKE 2 TABLETS BY MOUTH TWICE DAILY   360 each   3     BP 141/73  Pulse 99  Temp(Src) 97.6 F (36.4 C) (Oral)  Resp 18  Ht 5\' 5"  (1.651 m)  Wt 285 lb (129.275 kg)  BMI 47.43 kg/m2  SpO2 99%  Physical Exam  Nursing note and vitals reviewed. Constitutional: She is oriented to person, place, and time. She appears well-developed and well-nourished.  HENT:  Head: Normocephalic and atraumatic.  Eyes: EOM are normal. Pupils are equal, round, and reactive to light.  Neck: Normal range of motion. Neck supple.  Cardiovascular: Normal rate, normal heart sounds and intact distal pulses.   Pulmonary/Chest: Effort normal and breath sounds normal.  Abdominal: Bowel sounds are normal. She exhibits no distension. There is tenderness (LLQ, moderate). There is no rebound and no guarding.  Obese  Musculoskeletal: Normal range of motion. She exhibits no edema and no tenderness.  Neurological: She is alert and oriented to person, place, and time. She has normal strength. No cranial nerve deficit or sensory deficit.  Skin: Skin is warm and dry. No rash noted.  Psychiatric: She has a normal mood and affect.    ED Course  Procedures (including critical care time)  Labs Reviewed  URINALYSIS, ROUTINE W REFLEX MICROSCOPIC  CBC WITH DIFFERENTIAL  COMPREHENSIVE METABOLIC PANEL   No results found.   No diagnosis found.    MDM  Care signed out to Dr. Ranae Palms pending labs and CT to eval for perforation.        Charles B. Bernette Mayers, MD 01/26/13 [phone]

## 2013-01-26 NOTE — ED Notes (Signed)
Pt states she has a hx of diverticulitis and has had similar pain x 1 week. LLQ. Denies other s/s.

## 2013-01-26 NOTE — ED Notes (Signed)
Called [facility] to call hospitalist for doctor.

## 2013-01-27 DIAGNOSIS — K5730 Diverticulosis of large intestine without perforation or abscess without bleeding: Secondary | ICD-10-CM

## 2013-01-27 DIAGNOSIS — K219 Gastro-esophageal reflux disease without esophagitis: Secondary | ICD-10-CM

## 2013-01-27 DIAGNOSIS — E785 Hyperlipidemia, unspecified: Secondary | ICD-10-CM

## 2013-01-27 DIAGNOSIS — I10 Essential (primary) hypertension: Secondary | ICD-10-CM

## 2013-01-27 LAB — COMPREHENSIVE METABOLIC PANEL
ALT: 14 U/L (ref 0–35)
AST: 16 U/L (ref 0–37)
Albumin: 2.8 g/dL — ABNORMAL LOW (ref 3.5–5.2)
CO2: 28 mEq/L (ref 19–32)
Calcium: 8.3 mg/dL — ABNORMAL LOW (ref 8.4–10.5)
Creatinine, Ser: 0.82 mg/dL (ref 0.50–1.10)
GFR calc non Af Amer: 76 mL/min — ABNORMAL LOW (ref >90)
Sodium: 139 mEq/L (ref 135–145)

## 2013-01-27 LAB — TSH: TSH: 11.176 u[IU]/mL — ABNORMAL HIGH (ref 0.350–4.500)

## 2013-01-27 LAB — CBC
HCT: 32.7 % — ABNORMAL LOW (ref 36.0–46.0)
Hemoglobin: 11.2 g/dL — ABNORMAL LOW (ref 12.0–15.0)
MCH: 28.6 pg (ref 26.0–34.0)
MCHC: 34.3 g/dL (ref 30.0–36.0)
MCV: 83.6 fL (ref 78.0–100.0)
RBC: 3.91 MIL/uL (ref 3.87–5.11)

## 2013-01-27 LAB — GLUCOSE, CAPILLARY
Glucose-Capillary: 100 mg/dL — ABNORMAL HIGH (ref 70–99)
Glucose-Capillary: 105 mg/dL — ABNORMAL HIGH (ref 70–99)
Glucose-Capillary: 109 mg/dL — ABNORMAL HIGH (ref 70–99)
Glucose-Capillary: 92 mg/dL (ref 70–99)

## 2013-01-27 LAB — PHOSPHORUS: Phosphorus: 3.4 mg/dL (ref 2.3–4.6)

## 2013-01-27 MED ORDER — CHLORHEXIDINE GLUCONATE 0.12 % MT SOLN
15.0000 mL | Freq: Two times a day (BID) | OROMUCOSAL | Status: DC
  Administered 2013-01-27 – 2013-01-30 (×6): 15 mL via OROMUCOSAL
  Filled 2013-01-27 (×5): qty 15

## 2013-01-27 MED ORDER — BIOTENE DRY MOUTH MT LIQD
15.0000 mL | Freq: Two times a day (BID) | OROMUCOSAL | Status: DC
  Administered 2013-01-27 – 2013-01-29 (×6): 15 mL via OROMUCOSAL

## 2013-01-27 NOTE — Progress Notes (Signed)
Patient ID: [name]  female  ZOX:[id number]    DOB: 1951/07/19    DOA: 01/26/2013  PCP: [name], MD  Assessment/Plan: Principal Problem:   Diverticulitis; recurrent, first episode in 1/14, saw Dr. [name] for followup, her appointment with Dr. [name] (GI) is on 02/19/13. She had a flexible sigmoidoscopy done 1/11 which had shown moderate diverticulosis of the sigmoid colon. CT scan done at [facility] showed sigmoid diverticulitis without perforation or abscess. - Start trial of clears per patient's request, if she's not able to tolerate well, then continue n.p.o. - Continue Zosyn. She will definitely need coloscopy probably outpatient once her acute diverticulitis episode is resolved - GI consulted, requested Dr. [name] to notify Dr. [name] to follow up in a.m.  Active Problems:   HYPOTHYROIDISM: - Continue Synthroid    HYPERTENSION - Continue home medications    ASTHMA stable and compensated     GERD - Continue IV PPI    Type 2 diabetes mellitus - Continue sliding scale insulin   DVT Prophylaxis:  Code Status: Full  Disposition:    Subjective: Still having left lower quadrant abdominal pain, no nausea vomiting no fevers   Objective: Weight change:   Intake/Output Summary (Last 24 hours) at 01/27/13 1101 Last data filed at 01/27/13 0700  Gross per 24 hour  Intake    120 ml  Output    250 ml  Net   -130 ml   Blood pressure 107/60, pulse 77, temperature 98 F (36.7 C), temperature source Oral, resp. rate 16, height 5\' 5"  (1.651 m), weight 136.4 kg (300 lb 11.3 oz), SpO2 100.00%.  Physical Exam: General: Alert and awake, oriented x3, not in any acute distress.  CVS: S1-S2 clear, no murmur rubs or gallops Chest: clear to auscultation bilaterally, no wheezing, rales or rhonchi Abdomen: soft mild left lower quadrant tenderness to palpation, normal bowel sounds  Extremities: no cyanosis, clubbing or edema noted bilaterally   Lab Results: Basic Metabolic  Panel:  Recent Labs Lab 01/26/13 1415 01/27/13 0416  NA 140 139  K 3.3* 3.6  CL 100 101  CO2 30 28  GLUCOSE 118* 123*  BUN 11 10  CREATININE 0.80 0.82  CALCIUM 9.2 8.3*  MG  --  1.5  PHOS  --  3.4   Liver Function Tests:  Recent Labs Lab 01/26/13 1415 01/27/13 0416  AST 18 16  ALT 17 14  ALKPHOS 89 78  BILITOT 0.3 0.5  PROT 7.1 5.9*  ALBUMIN 3.4* 2.8*   No results found for this basename: LIPASE, AMYLASE,  in the last 168 hours No results found for this basename: AMMONIA,  in the last 168 hours CBC:  Recent Labs Lab 01/26/13 1415 01/27/13 0416  WBC 13.0* 11.6*  NEUTROABS 6.7  --   HGB 12.8 11.2*  HCT 39.1 32.7*  MCV 86.1 83.6  PLT 295 265   Cardiac Enzymes: No results found for this basename: CKTOTAL, CKMB, CKMBINDEX, TROPONINI,  in the last 168 hours BNP: No components found with this basename: POCBNP,  CBG:  Recent Labs Lab 01/26/13 2116 01/27/13 0007 01/27/13 0348 01/27/13 0756  GLUCAP 80 116* 127* 100*     Micro Results: No results found for this or any previous visit (from the past 240 hour(s)).  Studies/Results: Ct Abdomen Pelvis W Contrast  01/26/2013  *RADIOLOGY REPORT*  Clinical Data: 62 year old female with abdominal and pelvic pain. History of recent diverticulitis.  CT ABDOMEN AND PELVIS WITH CONTRAST  Technique:  Multidetector CT imaging of  the abdomen and pelvis was performed following the standard protocol during bolus administration of intravenous contrast.  Contrast: 50 ml intravenous Omnipaque-300  Comparison: 12/04/2012 and prior CTs dating back to 06/10/2006  Findings: Mild fatty infiltration of the liver is again identified. No focal hepatic abnormalities are present. At least three gallstones are present, the largest measuring 2 cm. No CT evidence of acute cholecystitis identified. The spleen, pancreas, adrenal glands and kidneys are unremarkable except for A right renal cyst and five punctate nonobstructing mid renal calculus.   There is wall thickening inflammation of the proximal sigmoid colon compatible with diverticulitis and increased since the prior study. There is no evidence of focal abscess.  No gross evidence of pneumoperitoneum is identified.  There is no evidence of free fluid, abdominal aortic aneurysm, biliary dilatation or enlarged lymph nodes. The patient is status post hysterectomy. The bladder and appendix are unremarkable.  No acute or suspicious bony abnormalities are identified.  IMPRESSION: Moderate acute proximal sigmoid colonic diverticulitis without abscess or gross pneumoperitoneum.  This has increased since 12/04/2012.  Cholelithiasis without CT evidence of acute cholecystitis.  Fatty infiltration of the liver.   Original Report Authenticated By: Harmon Pier, M.D.     Medications: Scheduled Meds: . sodium chloride   Intravenous STAT  . antiseptic oral rinse  15 mL Mouth Rinse q12n4p  . aspirin  81 mg Oral Daily  . chlorhexidine  15 mL Mouth Rinse BID  . docusate sodium  100 mg Oral BID  . enoxaparin (LOVENOX) injection  40 mg Subcutaneous Q24H  . famotidine  20 mg Oral QHS  . hyoscyamine  0.375 mg Oral q morning - 10a  . insulin aspart  0-9 Units Subcutaneous Q4H  . levothyroxine  175 mcg Oral QAC breakfast  . loratadine  10 mg Oral Daily  . losartan  50 mg Oral Daily  . metoprolol tartrate  25 mg Oral q morning - 10a  . mometasone-formoterol  2 puff Inhalation BID  . pantoprazole (PROTONIX) IV  40 mg Intravenous QHS  . piperacillin-tazobactam (ZOSYN)  IV  3.375 g Intravenous Q8H  . simvastatin  10 mg Oral QHS  . traZODone  150 mg Oral QHS  . zolpidem  5 mg Oral QHS      LOS: 1 day   Giulian Goldring M.D. [facility] 01/27/2013, 11:01 AM Pager: [phone]  If 7PM-7AM, please contact night-coverage [email or web address] Password [email or web address]

## 2013-01-28 LAB — GLUCOSE, CAPILLARY
Glucose-Capillary: 102 mg/dL — ABNORMAL HIGH (ref 70–99)
Glucose-Capillary: 109 mg/dL — ABNORMAL HIGH (ref 70–99)

## 2013-01-28 MED ORDER — POLYETHYLENE GLYCOL 3350 17 G PO PACK
17.0000 g | PACK | Freq: Every day | ORAL | Status: DC
  Administered 2013-01-28 – 2013-01-29 (×2): 17 g via ORAL
  Filled 2013-01-28 (×3): qty 1

## 2013-01-28 MED ORDER — SODIUM CHLORIDE 0.9 % IV SOLN
INTRAVENOUS | Status: DC
  Administered 2013-01-28: 12:00:00 via INTRAVENOUS

## 2013-01-28 MED ORDER — SODIUM CHLORIDE 0.45 % IV SOLN
INTRAVENOUS | Status: DC
  Administered 2013-01-28 – 2013-01-29 (×2): via INTRAVENOUS

## 2013-01-28 MED ORDER — PANTOPRAZOLE SODIUM 40 MG PO TBEC
40.0000 mg | DELAYED_RELEASE_TABLET | Freq: Every day | ORAL | Status: DC
  Administered 2013-01-28 – 2013-01-29 (×2): 40 mg via ORAL
  Filled 2013-01-28 (×2): qty 1

## 2013-01-28 NOTE — Progress Notes (Signed)
Patient ID: [name]  female  [id number]    DOB: 05-24-1951    DOA: 01/26/2013  PCP: [name], MD  Assessment/Plan: Principal Problem:   Diverticulitis; recurrent, first episode in 1/14, saw Dr. [name] for followup, her appointment with Dr. [name] (GI) is on 02/19/13. She had a flexible sigmoidoscopy done 1/11 which had shown moderate diverticulosis of the sigmoid colon. CT scan done at [facility] showed sigmoid diverticulitis without perforation or abscess. - Advance to full liquids today, gentle hydration, Zosyn. - GI consulted, appreciated conditions, will need coloscopy probably outpatient once her acute diverticulitis episode is resolved - Surgical evaluation in future  Active Problems:   HYPOTHYROIDISM: - Continue Synthroid    HYPERTENSION - Continue home medications    ASTHMA stable and compensated     GERD - Continue IV PPI    Type 2 diabetes mellitus: Blood sugars are controlled - Continue sliding scale insulin   DVT Prophylaxis:  Code Status: Full  Disposition:    Subjective: Left lower quadrant abdominal pain improving, no nausea, vomiting, tolerated clear liquid diet well Objective: Weight change:   Intake/Output Summary (Last 24 hours) at 01/28/13 1528 Last data filed at 01/28/13 0900  Gross per 24 hour  Intake  532.5 ml  Output   2350 ml  Net -1817.5 ml   Blood pressure 95/38, pulse 75, temperature 97.9 F (36.6 C), temperature source Oral, resp. rate 18, height 5\' 5"  (1.651 m), weight 136.4 kg (300 lb 11.3 oz), SpO2 92.00%.  Physical Exam: General: A x O x3, NAD CVS: S1-S2 clear, no murmur rubs or gallops Chest: CTAB Abdomen: soft LLQ tenderness improving NBS  Extremities: no c/c/e bilaterally   Lab Results: Basic Metabolic Panel:  Recent Labs Lab 01/26/13 1415 01/27/13 0416  NA 140 139  K 3.3* 3.6  CL 100 101  CO2 30 28  GLUCOSE 118* 123*  BUN 11 10  CREATININE 0.80 0.82  CALCIUM 9.2 8.3*  MG  --  1.5  PHOS  --  3.4    Liver Function Tests:  Recent Labs Lab 01/26/13 1415 01/27/13 0416  AST 18 16  ALT 17 14  ALKPHOS 89 78  BILITOT 0.3 0.5  PROT 7.1 5.9*  ALBUMIN 3.4* 2.8*   No results found for this basename: LIPASE, AMYLASE,  in the last 168 hours No results found for this basename: AMMONIA,  in the last 168 hours CBC:  Recent Labs Lab 01/26/13 1415 01/27/13 0416  WBC 13.0* 11.6*  NEUTROABS 6.7  --   HGB 12.8 11.2*  HCT 39.1 32.7*  MCV 86.1 83.6  PLT 295 265   Cardiac Enzymes: No results found for this basename: CKTOTAL, CKMB, CKMBINDEX, TROPONINI,  in the last 168 hours BNP: No components found with this basename: POCBNP,  CBG:  Recent Labs Lab 01/27/13 2011 01/28/13 0003 01/28/13 0405 01/28/13 0756 01/28/13 1206  GLUCAP 92 82 99 102* 127*     Micro Results: No results found for this or any previous visit (from the past 240 hour(s)).  Studies/Results: Ct Abdomen Pelvis W Contrast  01/26/2013  *RADIOLOGY REPORT*  Clinical Data: 62 year old female with abdominal and pelvic pain. History of recent diverticulitis.  CT ABDOMEN AND PELVIS WITH CONTRAST  Technique:  Multidetector CT imaging of the abdomen and pelvis was performed following the standard protocol during bolus administration of intravenous contrast.  Contrast: 50 ml intravenous Omnipaque-300  Comparison: 12/04/2012 and prior CTs dating back to 06/10/2006  Findings: Mild fatty infiltration of the liver is  again identified. No focal hepatic abnormalities are present. At least three gallstones are present, the largest measuring 2 cm. No CT evidence of acute cholecystitis identified. The spleen, pancreas, adrenal glands and kidneys are unremarkable except for A right renal cyst and five punctate nonobstructing mid renal calculus.  There is wall thickening inflammation of the proximal sigmoid colon compatible with diverticulitis and increased since the prior study. There is no evidence of focal abscess.  No gross evidence of  pneumoperitoneum is identified.  There is no evidence of free fluid, abdominal aortic aneurysm, biliary dilatation or enlarged lymph nodes. The patient is status post hysterectomy. The bladder and appendix are unremarkable.  No acute or suspicious bony abnormalities are identified.  IMPRESSION: Moderate acute proximal sigmoid colonic diverticulitis without abscess or gross pneumoperitoneum.  This has increased since 12/04/2012.  Cholelithiasis without CT evidence of acute cholecystitis.  Fatty infiltration of the liver.   Original Report Authenticated By: Harmon Pier, M.D.     Medications: Scheduled Meds: . antiseptic oral rinse  15 mL Mouth Rinse q12n4p  . aspirin  81 mg Oral Daily  . chlorhexidine  15 mL Mouth Rinse BID  . docusate sodium  100 mg Oral BID  . enoxaparin (LOVENOX) injection  40 mg Subcutaneous Q24H  . famotidine  20 mg Oral QHS  . hyoscyamine  0.375 mg Oral q morning - 10a  . insulin aspart  0-9 Units Subcutaneous Q4H  . levothyroxine  175 mcg Oral QAC breakfast  . loratadine  10 mg Oral Daily  . losartan  50 mg Oral Daily  . metoprolol tartrate  25 mg Oral q morning - 10a  . mometasone-formoterol  2 puff Inhalation BID  . pantoprazole  40 mg Oral QHS  . piperacillin-tazobactam (ZOSYN)  IV  3.375 g Intravenous Q8H  . simvastatin  10 mg Oral QHS  . traZODone  150 mg Oral QHS  . zolpidem  5 mg Oral QHS      LOS: 2 days   RAI,RIPUDEEP M.D. [facility] 01/28/2013, 3:28 PM Pager: [phone]  If 7PM-7AM, please contact night-coverage [email or web address] Password [email or web address]

## 2013-01-28 NOTE — Consult Note (Signed)
[facility] Consult: 9:33 AM 01/28/2013   Referring Provider: Dr. Isidoro Donning Primary Care Physician:  [name], MD Primary Gastroenterologist:  Dr. [name]  Reason for Consultation:  LLQ abdominal pain; diverticulitis  HPI: [name] is a 62 y.o. female who is a patient of Dr. Marvell Fuller with PMH of thyroid disease, migraines, DM, and asthma.  She has somewhat of a complicated GI history dating back to 2009.  In 2009 she presented with LLQ abdominal pain, nausea, and vomiting with CT abnormalities in LLQ.  Differential was ischemic colitis vs IBD.  Was treated conservatively and symptoms resolved.  Colonoscopy at that time showed a segment of severe colitis with biopsies C/W ischemic colitis.  Follow-up F/S two months later was normal other than diverticulosis.  Then again in 2011 she presented with LLQ abdominal pain and CT scan showed diverticulitis.  Follow-up F/S a few months later showed only divertculosis once again and random biopsies showed no abnormalities.  Now, she presented with in January with LLQ abdominal pain and was treated once again for diverticulitis with a course of clindamycin.  She was feeling better until about a week ago when she again developed LLQ abdominal pain.  Worsened over the course of the week until they were very sharp pains, and she presented to [facility].  CT scan done at [facility] showed sigmoid diverticulitis without perforation and she was started on Flagyl and Zosyn and transferred to [facility] med-surge bed.  Flagyl has been discontinued, but she remains on Zosyn.  Tolerated clear liquid diet.  She is on levbid as well for symptomatic relief.  She reports two episodes of nausea when she was first admitted but no vomiting.  No fevers.  No diarrhea or bloody stool.  No BM for 3 days, but says that she usually moves her bowels every few days and uses Miralax prn.   Past Medical History  Diagnosis Date  . Thyroid disease    . Migraine   . Diabetes mellitus   . Asthma   . Diverticulitis   . Diverticulosis     Past Surgical History  Procedure Laterality Date  . Cesarean section    . Abdominal hysterectomy    . Knee cartilage surgery      Left  . Cataract extraction, bilateral      Prior to Admission medications   Medication Sig Start Date End Date Taking? Authorizing Provider  albuterol (PROVENTIL HFA;VENTOLIN HFA) 108 (90 BASE) MCG/ACT inhaler Inhale 2 puffs into the lungs every 6 (six) hours as needed for wheezing. For wheezing   Yes Historical Provider, MD  ALPRAZolam Prudy Feeler) 0.5 MG tablet Take 0.5 mg by mouth 2 (two) times daily as needed for sleep. For sleep/anxiety 07/19/12  Yes [name], MD  aspirin 81 MG tablet Take 81 mg by mouth daily.     Yes Historical Provider, MD  aspirin-acetaminophen-caffeine (EXCEDRIN MIGRAINE) [phone] MG per tablet Take 1 tablet by mouth every 6 (six) hours as needed. For pain/headache   Yes Historical Provider, MD  cetirizine (ZYRTEC) 10 MG tablet Take 10 mg by mouth at bedtime.    Yes Historical Provider, MD  Cholecalciferol (VITAMIN D) 2000 UNITS tablet Take 2,000 Units by mouth daily.     Yes Historical Provider, MD  diphenhydrAMINE (BENADRYL) 25 MG tablet Take 25 mg by mouth daily as needed for allergies. For nasal congestion and allergy symptoms   Yes Historical Provider, MD  esomeprazole (NEXIUM) 40 MG capsule Take 40 mg by mouth daily before breakfast.  Yes Historical Provider, MD  famotidine (PEPCID) 20 MG tablet Take 20 mg by mouth at bedtime.     Yes Historical Provider, MD  Fluticasone-Salmeterol (ADVAIR) 250-50 MCG/DOSE AEPB Inhale 1 puff into the lungs 2 (two) times daily as needed. For seasonal asthma   Yes Historical Provider, MD  furosemide (LASIX) 40 MG tablet Take 40 mg by mouth daily.    Yes Historical Provider, MD  hyoscyamine (LEVBID) 0.375 MG 12 hr tablet Take 0.375 mg by mouth every morning.    Yes Historical Provider, MD  levothyroxine  (SYNTHROID) 175 MCG tablet Take 175 mcg by mouth daily.  12/05/11 05/04/13 Yes [name], MD  losartan (COZAAR) 50 MG tablet Take 50 mg by mouth daily.   Yes Historical Provider, MD  metoprolol tartrate (LOPRESSOR) 25 MG tablet Take 25 mg by mouth every morning.    Yes Historical Provider, MD  pseudoephedrine (SUDAFED) 30 MG tablet Take 30 mg by mouth daily as needed for congestion. For nasal congestion   Yes Historical Provider, MD  simvastatin (ZOCOR) 10 MG tablet Take 10 mg by mouth at bedtime.   Yes Historical Provider, MD  traZODone (DESYREL) 150 MG tablet Take 150 mg by mouth at bedtime.   Yes Historical Provider, MD  vitamin B-12 (CYANOCOBALAMIN) 100 MCG tablet Take 100 mcg by mouth every morning.    Yes Historical Provider, MD  zileuton (ZYFLO CR) 600 MG CR tablet Take 1,200 mg by mouth 2 (two) times daily.   Yes Historical Provider, MD  zolpidem (AMBIEN) 10 MG tablet Take 10 mg by mouth at bedtime.   Yes Historical Provider, MD    Scheduled Meds: . antiseptic oral rinse  15 mL Mouth Rinse q12n4p  . aspirin  81 mg Oral Daily  . chlorhexidine  15 mL Mouth Rinse BID  . docusate sodium  100 mg Oral BID  . enoxaparin (LOVENOX) injection  40 mg Subcutaneous Q24H  . famotidine  20 mg Oral QHS  . hyoscyamine  0.375 mg Oral q morning - 10a  . insulin aspart  0-9 Units Subcutaneous Q4H  . levothyroxine  175 mcg Oral QAC breakfast  . loratadine  10 mg Oral Daily  . losartan  50 mg Oral Daily  . metoprolol tartrate  25 mg Oral q morning - 10a  . mometasone-formoterol  2 puff Inhalation BID  . pantoprazole (PROTONIX) IV  40 mg Intravenous QHS  . piperacillin-tazobactam (ZOSYN)  IV  3.375 g Intravenous Q8H  . simvastatin  10 mg Oral QHS  . traZODone  150 mg Oral QHS  . zolpidem  5 mg Oral QHS     PRN Meds: acetaminophen, acetaminophen, albuterol, albuterol, ALPRAZolam, diphenhydrAMINE, HYDROcodone-acetaminophen, HYDROmorphone (DILAUDID) injection, ondansetron (ZOFRAN) IV,  ondansetron   Allergies as of 01/26/2013 - Review Complete 01/26/2013  Allergen Reaction Noted  . Levofloxacin Anaphylaxis 07/10/2008  . Lisinopril Anaphylaxis 07/10/2008  . Bee venom Swelling 05/27/2011  . Morphine and related Itching 05/27/2011    Family History  Problem Relation Age of Onset  . Heart disease Mother   . Kidney failure Father   . Diabetes Father   . Breast cancer Paternal Aunt     History   Social History  . Marital Status: Single    Spouse Name: N/A    Number of Children: N/A  . Years of Education: N/A   Occupational History  . Not on file.   Social History Main Topics  . Smoking status: Former Games developer  . Smokeless tobacco: Not on  file     Comment: 25 years ago  . Alcohol Use: No  . Drug Use: No  . Sexually Active: Not on file   Other Topics Concern  . Not on file   Social History Narrative  . No narrative on file    REVIEW OF SYSTEMS: Ten point ROS is O/W negative except as mentioned in HPI.   Physical Exam: Vital signs in last 24 hours: Temp:  [97.9 F (36.6 C)-98.3 F (36.8 C)] 98.3 F (36.8 C) (03/17 0635) Pulse Rate:  [68-86] 80 (03/17 0808) Resp:  [14-18] 16 (03/17 0808) BP: (93-112)/(45-60) 112/57 mmHg (03/17 0635) SpO2:  [94 %-100 %] 97 % (03/17 0822) Last BM Date: 01/25/13  General:   Alert,  Well-developed, well-nourished, pleasant and cooperative in NAD Head:  Normocephalic and atraumatic. Eyes:  Sclera clear, no icterus.  Conjunctiva pink. Ears:  Normal auditory acuity. Mouth:  No deformity or lesions.   Lungs:  Clear throughout to auscultation.  No wheezes, crackles, or rhonchi.  Heart:  Regular rate and rhythm; no murmurs, clicks, rubs,  or gallops. Abdomen:  Soft, obese, non-distended, BS active, moderate TTP in LLQ. Rectal:  Deferred  Msk:  Symmetrical without gross deformities. Pulses:  Normal pulses noted. Extremities:  Without clubbing or edema. Neurologic:  Alert and  oriented x4;  grossly normal  neurologically. Skin:  Intact without significant lesions or rashes. Psych:  Alert and cooperative. Normal mood and affect.  Intake/Output from previous day: 03/16 0701 - 03/17 0700 In: 292.5 [P.O.:120; I.V.:160; IV Piggyback:12.5] Out: 1550 [Urine:1550]  LAB RESULTS:  Recent Labs  01/26/13 1415 01/27/13 0416  WBC 13.0* 11.6*  HGB 12.8 11.2*  HCT 39.1 32.7*  PLT 295 265   BMET Lab Results  Component Value Date   NA 139 01/27/2013   NA 140 01/26/2013   NA 136 12/04/2012   K 3.6 01/27/2013   K 3.3* 01/26/2013   K 3.5 12/04/2012   CL 101 01/27/2013   CL 100 01/26/2013   CL 98 12/04/2012   CO2 28 01/27/2013   CO2 30 01/26/2013   CO2 28 12/04/2012   GLUCOSE 123* 01/27/2013   GLUCOSE 118* 01/26/2013   GLUCOSE 139* 12/04/2012   BUN 10 01/27/2013   BUN 11 01/26/2013   BUN 6 12/04/2012   CREATININE 0.82 01/27/2013   CREATININE 0.80 01/26/2013   CREATININE 1.10 12/04/2012   CALCIUM 8.3* 01/27/2013   CALCIUM 9.2 01/26/2013   CALCIUM 10.2 12/04/2012   LFT  Recent Labs  01/26/13 1415 01/27/13 0416  PROT 7.1 5.9*  ALBUMIN 3.4* 2.8*  AST 18 16  ALT 17 14  ALKPHOS 89 78  BILITOT 0.3 0.5   PT/INR Lab Results  Component Value Date   INR 1.0 04/12/2008    RADIOLOGY STUDIES: Ct Abdomen Pelvis W Contrast  01/26/2013    Findings: Mild fatty infiltration of the liver is again identified. No focal hepatic abnormalities are present. At least three gallstones are present, the largest measuring 2 cm. No CT evidence of acute cholecystitis identified. The spleen, pancreas, adrenal glands and kidneys are unremarkable except for A right renal cyst and five punctate nonobstructing mid renal calculus.  There is wall thickening inflammation of the proximal sigmoid colon compatible with diverticulitis and increased since the prior study. There is no evidence of focal abscess.  No gross evidence of pneumoperitoneum is identified.  There is no evidence of free fluid, abdominal aortic aneurysm, biliary  dilatation or enlarged lymph nodes. The patient is status  post hysterectomy. The bladder and appendix are unremarkable.  No acute or suspicious bony abnormalities are identified.  IMPRESSION: Moderate acute proximal sigmoid colonic diverticulitis without abscess or gross pneumoperitoneum.  This has increased since 12/04/2012.  Cholelithiasis without CT evidence of acute cholecystitis.  Fatty infiltration of the liver.   Original Report Authenticated By: Harmon Pier, M.D.     ENDOSCOPIC STUDIES: 11/2009  Flex Sig  INDICATIONS: abnormal imaging recent LLQ pain, diarrhea and rectal bleeding  CT suggested mild diverticulitis  hx of segmental left colitis in past 2009 and also prior incidental cecal ulcers (2008) ENDOSCOPIC IMPRESSION:  1) Moderate diverticulosis in the sigmoid colon  2) Otherwise normal examination to descending colon  3) No colitis evident. random biopsied taken. Not clear what recent problems were caused by, am not convinced CT findings are diagnostic of diverticulitis. I do think underlying IBS is likely but ? other process given hx.  RECOMMENDATIONS:  1) await biopsy results  2) Needs oral iron if she can tolerate. if not, then would be  reasonable to boost iron stores with IV iron again. Hgb is normal but ferritin only 18 (very low normal). B12 ok.  3) anticipate at least a follow-up in the office in 2 months to reassess Pathology 1. COLON, BIOPSY, RANDOM : COLON, RANDOM BIOPSY: BENIGN COLONIC MUCOSA, NO EVIDENCE OF ACTIVE INFLAMMATION, DYSPLASIA OR MALIGNANCY IDENTIFIED.   06/2008  Flex Sig Indication: HOSPITALIZED WITH ISCHEMIC COLITIS 3 MOS AGO, SOME PERSISTENT SXS OF  DIARRHEA> CONSTIPATION, ? IF ACTUALLY HAS A CHRONIC COLITIS Comments:  1) SIGMOID DIVERTICULOSIS  2) NORMAL FLEX SIG OTHERWISE, NO COLITIS  3) I THINK SHE HAS IBS  04/2008  Colonoscopy  Dr Christella Hartigan.  Indication: RECENT DIARRHEA, VOMITTING ILLNESS.Marland KitchenMarland KitchenCT SUGGESTED LEFT SIDED  INFLAMMATION...H/O CECAL  ULCERATIONS SUGGESTING IBD (2008, DR. [name]) Assessment:  30CM SEGMENT OF SEVERE COLITIS IN LEFT COLON (SPLENIC FLEXURE/DESCENDING COLON). OTHERWISE COLONIC MUCOSA WAS NORMAL.  BIOPSIES TAKEN FROM THE INLFAMMED SEGMENT. MULTIPLE ATTEMPTS TO  INTUBATE THE TERMINAL ILEUM WERE UNSUCCESSFUL. PREVIOUSLY SEEN  CECAL APTHOUS ULCERS HAVE RESOLVED.  I SUSPECT SHE HAS IBD (CROHN'S DISEASE). WILL START ON PREDNISONE 20MG  TWICE DAILY AND OBSERVE HER OVERNIGHT. HOPEFULLY OK TO D/C  HOME TOMORROW WITH FOLLOW UP WITH DR. [name] IN 2-3 WEEKS, NOT TAPERING HER STEROIDS UNTIL THEN. Pathology  COLON, DESCENDING, BIOPSIES: ULCERATED COLONIC MUCOSA. SEECOMMENT. COMMENT: There are colonic mucosal fragments associated with fibrinopurulent exudate and fibrosis consistent with ulceration.  Focally the colonic mucosal fragments show crypt atrophy with fibrosis in the stroma. The differential diagnosis includes ischemic related ulceration. No granulomas are identified.  11/2006  EGD Symptoms:  Dysphagia. Reflux symptoms  Comments:  Reflux controlled on Nexium. Findings:  - Normal: Proximal Esophagus to Duodenal 2nd Portion. Comments: z-line at 40 cm.    IMPRESSION: -Sigmoid diverticulitis, recurrent:  This is at least her fourth episode of LLQ pain and CT abnormalities in the past 5 years.  Two of these episodes were within the last 2 months.  PLAN: -Continue IV abx (Zosyn), pain control, IVF's, full liquids. -Follow-up with Dr. [name] already scheduled for 4/8 at 10:45 am. -May want to consider surgical evaluation/consultation in the future.    LOS: 2 days   Jennye Moccasin  01/28/2013, 9:33 AM Pager: [phone]

## 2013-01-28 NOTE — Consult Note (Signed)
Patient seen, examined, and I agree with the above documentation, including the assessment and plan. I agree with the diagnosis of recurrent sigmoid diverticulitis as indicated by CT scan. This has been an ongoing issue, though not as severe since late January 2014. Her prior endoscopies are reviewed, and though there was some question of IBD after one procedure, my suspicion for inflammatory bowel disease remains low She seems to be responding to Zosyn, and any supportive care and pain control Also agree with the idea of possible surgical consultation to consider sigmoid resection given her recurrent issues in this segment of colon. Ideally this would be performed when she is well (no active colitis-diverticulitis), as this would decrease the chance of her having to live for short time with an ostomy

## 2013-01-29 DIAGNOSIS — F329 Major depressive disorder, single episode, unspecified: Secondary | ICD-10-CM

## 2013-01-29 DIAGNOSIS — F3289 Other specified depressive episodes: Secondary | ICD-10-CM

## 2013-01-29 DIAGNOSIS — E039 Hypothyroidism, unspecified: Secondary | ICD-10-CM

## 2013-01-29 DIAGNOSIS — K5900 Constipation, unspecified: Secondary | ICD-10-CM

## 2013-01-29 LAB — GLUCOSE, CAPILLARY
Glucose-Capillary: 101 mg/dL — ABNORMAL HIGH (ref 70–99)
Glucose-Capillary: 95 mg/dL (ref 70–99)
Glucose-Capillary: 118 mg/dL — ABNORMAL HIGH (ref 70–99)

## 2013-01-29 MED ORDER — AMOXICILLIN-POT CLAVULANATE 875-125 MG PO TABS
1.0000 | ORAL_TABLET | Freq: Two times a day (BID) | ORAL | Status: DC
  Administered 2013-01-29 (×2): 1 via ORAL
  Filled 2013-01-29 (×4): qty 1

## 2013-01-29 NOTE — Progress Notes (Signed)
Patient seen, examined, and I agree with the above documentation, including the assessment and plan. Improved abdominal pain today. Did have a bowel movement. Agree with change over to oral antibiotics with Augmentin 875 mg twice daily for an additional 14 days She will have follow with Dr. [name] Have recommended outpatient general surgery referral to discuss sigmoidectomy given her recurrent bouts of diverticulitis. She is aware of this recommendation and will think about it more and would like to discuss it with Dr. [name] Call with questions

## 2013-01-29 NOTE — Progress Notes (Signed)
Spoke with patient regarding Crisof[employer] [place] to [employer] as a benefit of being a [employer] employee with [employer]. Discussed how she could benefit from Link to Wellness program since she has diabetes. States she is interested. Left Link to Wellness packet and application at bedside. [facility] Care Management office will follow up to make patient appointment with one of the [facility] Care Managers. Ms Cannell appreciative of visit.   Raiford Noble, MSN-Ed, RN,BSN- [facility] [phone]

## 2013-01-29 NOTE — Progress Notes (Signed)
Patient ID: [name]  female  ZOX:[id number]    DOB: [birth year]    DOA: 01/26/2013  PCP: [name], MD  Assessment/Plan: Principal Problem:   Diverticulitis; recurrent, first episode in 1/14, saw Dr. [name] for followup, her appointment with Dr. [name] (GI) is on 02/19/13. She had a flexible sigmoidoscopy done 1/11 which had shown moderate diverticulosis of the sigmoid colon. CT scan done at [facility] showed sigmoid diverticulitis without perforation or abscess. -Advance to solids today - DC Zosyn, start on oral Augmentin for 14 days (she has used clindamycin in January/14, has not tolerated Flagyl, allergic to quinolones). - GI following, Surgical evaluation in future outpatient  Active Problems:   HYPOTHYROIDISM: - Continue Synthroid    HYPERTENSION - Continue home medications    ASTHMA stable and compensated     GERD - Continue PPI    Type 2 diabetes mellitus: Blood sugars are controlled - Continue sliding scale insulin   DVT Prophylaxis:  Code Status: Full  Disposition: DC in a.m. if tolerating solid diet and no issues with Augmentin   Subjective: Had grits this morning, has been tolerating liquid diet   Objective: Weight change:   Intake/Output Summary (Last 24 hours) at 01/29/13 1325 Last data filed at 01/28/13 1900  Gross per 24 hour  Intake    350 ml  Output    300 ml  Net     50 ml   Blood pressure 116/57, pulse 67, temperature 97.7 F (36.5 C), temperature source Oral, resp. rate 16, height 5\' 5"  (1.651 m), weight 136.4 kg (300 lb 11.3 oz), SpO2 96.00%.  Physical Exam: General: A x O x3, NAD CVS: S1-S2 clear Chest: CTAB Abdomen: soft, mild TTP in LLQ  NBS  Extremities: no c/c/e bilaterally   Lab Results: Basic Metabolic Panel:  Recent Labs Lab 01/26/13 1415 01/27/13 0416  NA 140 139  K 3.3* 3.6  CL 100 101  CO2 30 28  GLUCOSE 118* 123*  BUN 11 10  CREATININE 0.80 0.82  CALCIUM 9.2 8.3*  MG  --  1.5  PHOS  --  3.4   Liver  Function Tests:  Recent Labs Lab 01/26/13 1415 01/27/13 0416  AST 18 16  ALT 17 14  ALKPHOS 89 78  BILITOT 0.3 0.5  PROT 7.1 5.9*  ALBUMIN 3.4* 2.8*   No results found for this basename: LIPASE, AMYLASE,  in the last 168 hours No results found for this basename: AMMONIA,  in the last 168 hours CBC:  Recent Labs Lab 01/26/13 1415 01/27/13 0416  WBC 13.0* 11.6*  NEUTROABS 6.7  --   HGB 12.8 11.2*  HCT 39.1 32.7*  MCV 86.1 83.6  PLT 295 265   Cardiac Enzymes: No results found for this basename: CKTOTAL, CKMB, CKMBINDEX, TROPONINI,  in the last 168 hours BNP: No components found with this basename: POCBNP,  CBG:  Recent Labs Lab 01/28/13 1946 01/29/13 0002 01/29/13 0409 01/29/13 0743 01/29/13 1156  GLUCAP 101* 100* 101* 95 102*     Micro Results: No results found for this or any previous visit (from the past 240 hour(s)).  Studies/Results: Ct Abdomen Pelvis W Contrast  01/26/2013  *RADIOLOGY REPORT*  Clinical Data: 62 year old female with abdominal and pelvic pain. History of recent diverticulitis.  CT ABDOMEN AND PELVIS WITH CONTRAST  Technique:  Multidetector CT imaging of the abdomen and pelvis was performed following the standard protocol during bolus administration of intravenous contrast.  Contrast: 50 ml intravenous Omnipaque-300  Comparison: 12/04/2012 and  prior CTs dating back to 06/10/2006  Findings: Mild fatty infiltration of the liver is again identified. No focal hepatic abnormalities are present. At least three gallstones are present, the largest measuring 2 cm. No CT evidence of acute cholecystitis identified. The spleen, pancreas, adrenal glands and kidneys are unremarkable except for A right renal cyst and five punctate nonobstructing mid renal calculus.  There is wall thickening inflammation of the proximal sigmoid colon compatible with diverticulitis and increased since the prior study. There is no evidence of focal abscess.  No gross evidence of  pneumoperitoneum is identified.  There is no evidence of free fluid, abdominal aortic aneurysm, biliary dilatation or enlarged lymph nodes. The patient is status post hysterectomy. The bladder and appendix are unremarkable.  No acute or suspicious bony abnormalities are identified.  IMPRESSION: Moderate acute proximal sigmoid colonic diverticulitis without abscess or gross pneumoperitoneum.  This has increased since 12/04/2012.  Cholelithiasis without CT evidence of acute cholecystitis.  Fatty infiltration of the liver.   Original Report Authenticated By: Harmon Pier, M.D.     Medications: Scheduled Meds: . amoxicillin-clavulanate  1 tablet Oral Q12H  . antiseptic oral rinse  15 mL Mouth Rinse q12n4p  . aspirin  81 mg Oral Daily  . chlorhexidine  15 mL Mouth Rinse BID  . docusate sodium  100 mg Oral BID  . enoxaparin (LOVENOX) injection  40 mg Subcutaneous Q24H  . famotidine  20 mg Oral QHS  . hyoscyamine  0.375 mg Oral q morning - 10a  . insulin aspart  0-9 Units Subcutaneous Q4H  . levothyroxine  175 mcg Oral QAC breakfast  . loratadine  10 mg Oral Daily  . losartan  50 mg Oral Daily  . metoprolol tartrate  25 mg Oral q morning - 10a  . mometasone-formoterol  2 puff Inhalation BID  . pantoprazole  40 mg Oral QHS  . polyethylene glycol  17 g Oral Daily  . simvastatin  10 mg Oral QHS  . traZODone  150 mg Oral QHS  . zolpidem  5 mg Oral QHS      LOS: 3 days   Akirah Storck M.D. [facility] 01/29/2013, 1:25 PM Pager: [phone]  If 7PM-7AM, please contact night-coverage [email or web address] Password [email or web address]

## 2013-01-29 NOTE — Progress Notes (Signed)
[facility] Progress Note  Subjective:  Feels better today; still has some pain, but improved.  Had a BM.  Ate grits this AM and has been tolerating full liquid diet O/W as well.  Objective:  Vital signs in last 24 hours: Temp:  [97.7 F (36.5 C)-98.2 F (36.8 C)] 97.7 F (36.5 C) (03/18 0605) Pulse Rate:  [67-75] 67 (03/18 0605) Resp:  [16-18] 16 (03/18 0605) BP: (95-140)/(38-70) 116/57 mmHg (03/18 0605) SpO2:  [92 %-98 %] 96 % (03/18 0800) Last BM Date: 01/25/13 General:   Alert, Well-developed, in NAD Heart:  Regular rate and rhythm; no murmurs Pulm:  CTAB.  No W/R/R. Abdomen:  Soft, nondistended. Normal bowel sounds.  TTP in LLQ without R/R/G. Extremities:  Without edema. Neurologic:  Alert and  oriented x4;  grossly normal neurologically. Psych:  Alert and cooperative. Normal mood and affect.  Intake/Output from previous day: 03/17 0701 - 03/18 0700 In: 1430 [P.O.:720; I.V.:660; IV Piggyback:50] Out: 1100 [Urine:1100]  Lab Results:  Recent Labs  01/26/13 1415 01/27/13 0416  WBC 13.0* 11.6*  HGB 12.8 11.2*  HCT 39.1 32.7*  PLT 295 265   BMET  Recent Labs  01/26/13 1415 01/27/13 0416  NA 140 139  K 3.3* 3.6  CL 100 101  CO2 30 28  GLUCOSE 118* 123*  BUN 11 10  CREATININE 0.80 0.82  CALCIUM 9.2 8.3*   LFT  Recent Labs  01/27/13 0416  PROT 5.9*  ALBUMIN 2.8*  AST 16  ALT 14  ALKPHOS 78  BILITOT 0.5   Assessment / Plan: -Sigmoid diverticulitis, recurrent: This is at least her fourth episode of LLQ pain and CT abnormalities in the past 5 years. Two of these episodes were within the last 2 months.   -Change to PO abx today (Augmentin for 14 days) and advance diet to low fiber with plan for D/C in AM. -Already scheduled for follow-up with Dr. [name] on 4/8 at 10:45 am.  -May want to consider surgical evaluation/consultation in the future.     LOS: 3 days   Sachin Ferencz D.  01/29/2013, 9:07 AM  Pager number [phone]

## 2013-01-30 LAB — GLUCOSE, CAPILLARY: Glucose-Capillary: 116 mg/dL — ABNORMAL HIGH (ref 70–99)

## 2013-01-30 MED ORDER — TRAMADOL HCL 50 MG PO TABS: 50.0000 mg | ORAL_TABLET | Freq: Four times a day (QID) | ORAL | Status: DC | PRN

## 2013-01-30 MED ORDER — AMOXICILLIN-POT CLAVULANATE 875-125 MG PO TABS: 1.0000 | ORAL_TABLET | Freq: Two times a day (BID) | ORAL | Status: DC

## 2013-01-30 MED ORDER — PROMETHAZINE HCL 12.5 MG PO TABS: 12.5000 mg | ORAL_TABLET | Freq: Four times a day (QID) | ORAL | Status: DC | PRN

## 2013-01-30 MED ORDER — SENNOSIDES-DOCUSATE SODIUM 8.6-50 MG PO TABS: 2.0000 | ORAL_TABLET | Freq: Every evening | ORAL | Status: DC | PRN

## 2013-01-30 NOTE — Discharge Summary (Signed)
Physician Discharge Summary  Patient ID: [name] MRN: [id number] DOB/AGE: [birth year] 61 y.o.  Admit date: 01/26/2013 Discharge date: 01/30/2013  Primary Care Physician:  [name], MD  Discharge Diagnoses:   . Acute recurrent Diverticulitis . Type 2 diabetes mellitus . Morbid obesity . HYPERTENSION . ASTHMA . Hypokalemia . HYPOTHYROIDISM . GERD  Consults:  Gastroenterology    Discharge Medications:   Medication List    TAKE these medications       albuterol 108 (90 BASE) MCG/ACT inhaler  Commonly known as:  PROVENTIL HFA;VENTOLIN HFA  Inhale 2 puffs into the lungs every 6 (six) hours as needed for wheezing. For wheezing     ALPRAZolam 0.5 MG tablet  Commonly known as:  XANAX  Take 0.5 mg by mouth 2 (two) times daily as needed for sleep. For sleep/anxiety     amoxicillin-clavulanate 875-125 MG per tablet  Commonly known as:  AUGMENTIN  Take 1 tablet by mouth 2 (two) times daily. X 2 weeks     aspirin 81 MG tablet  Take 81 mg by mouth daily.     aspirin-acetaminophen-caffeine 250-250-65 MG per tablet  Commonly known as:  EXCEDRIN MIGRAINE  Take 1 tablet by mouth every 6 (six) hours as needed. For pain/headache     cetirizine 10 MG tablet  Commonly known as:  ZYRTEC  Take 10 mg by mouth at bedtime.     diphenhydrAMINE 25 MG tablet  Commonly known as:  BENADRYL  Take 25 mg by mouth daily as needed for allergies. For nasal congestion and allergy symptoms     esomeprazole 40 MG capsule  Commonly known as:  NEXIUM  Take 40 mg by mouth daily before breakfast.     famotidine 20 MG tablet  Commonly known as:  PEPCID  Take 20 mg by mouth at bedtime.     Fluticasone-Salmeterol 250-50 MCG/DOSE Aepb  Commonly known as:  ADVAIR  Inhale 1 puff into the lungs 2 (two) times daily as needed. For seasonal asthma     furosemide 40 MG tablet  Commonly known as:  LASIX  Take 40 mg by mouth daily.     hyoscyamine 0.375 MG 12 hr tablet  Commonly known as:   LEVBID  Take 0.375 mg by mouth every morning.     losartan 50 MG tablet  Commonly known as:  COZAAR  Take 50 mg by mouth daily.     metoprolol tartrate 25 MG tablet  Commonly known as:  LOPRESSOR  Take 25 mg by mouth every morning.     promethazine 12.5 MG tablet  Commonly known as:  PHENERGAN  Take 1 tablet (12.5 mg total) by mouth every 6 (six) hours as needed for nausea.     pseudoephedrine 30 MG tablet  Commonly known as:  SUDAFED  Take 30 mg by mouth daily as needed for congestion. For nasal congestion     senna-docusate 8.6-50 MG per tablet  Commonly known as:  SENOKOT S  Take 2 tablets by mouth at bedtime as needed for constipation.     simvastatin 10 MG tablet  Commonly known as:  ZOCOR  Take 10 mg by mouth at bedtime.     SYNTHROID 175 MCG tablet  Generic drug:  levothyroxine  Take 175 mcg by mouth daily.     traMADol 50 MG tablet  Commonly known as:  ULTRAM  Take 1 tablet (50 mg total) by mouth every 6 (six) hours as needed for pain.     traZODone 150  MG tablet  Commonly known as:  DESYREL  Take 150 mg by mouth at bedtime.     vitamin B-12 100 MCG tablet  Commonly known as:  CYANOCOBALAMIN  Take 100 mcg by mouth every morning.     Vitamin D 2000 UNITS tablet  Take 2,000 Units by mouth daily.     zolpidem 10 MG tablet  Commonly known as:  AMBIEN  Take 10 mg by mouth at bedtime.     ZYFLO CR 600 MG CR tablet  Generic drug:  zileuton  Take 1,200 mg by mouth 2 (two) times daily.         Brief H and P: For complete details please refer to admission H and P, but in brief [name] is a 62 y.o. female has a past medical history of Thyroid disease; Migraine; Diabetes mellitus; Asthma; Diverticulitis; and Diverticulosis. Patient was diagnosed with Diverticulitis a month ago and was treated with PO clindamycin, she has severe allergies to flouroquinolones. At first her symptoms have improved but 1 week ago they have recurred. She developed again LLQ  sharp pains and presented to [facility]. Patient does not endorse any nausea or diarrhea. No BM for the past 2 days but that was typical for her. CT scan done at [facility] showed sigmoid diverticulitis without perforation and she was started on Flagyl and Zosyn and transferred to [facility] med-surge bed. Patient have not had any fevers.  She does state that 5 years ago she may have had an episode of ischemic colitis but it was not confirmed.  She has hx of diet controlled diabetes.  Her asthma is followed by allergist and is currently under good control.    Hospital Course:  Diverticulitis; recurrent, first episode in 1/14, saw Dr. [name] for followup, her appointment with Dr. [name] (GI) is on 02/19/13. She had a flexible sigmoidoscopy done 1/11 which had shown moderate diverticulosis of the sigmoid colon. CT scan done at [facility] showed sigmoid diverticulitis without perforation or abscess. Patient was initially placed on IV fluids, clears and started on IV Zosyn to provide gram-negative and anaerobic coverage. Gastroenterology was consulted and agreed with the current management. Currently patient is tolerating solids however she was recommended the option to continue for liquids for a few days if she has any nausea or abdominal discomfort. Patient was started on oral Augmentin for 14 days (she has used clindamycin in January/14, has not tolerated Flagyl, allergic to quinolones). She has an appointment with Dr. [name] on 02/19/2013. She was recommended surgical evaluation in future if she continues to have recurrent diverticulitis.  HYPOTHYROIDISM:  - Continue Synthroid   HYPERTENSION  - Continue home medications   ASTHMA stable and compensated  GERD  - Continue PPI    Day of Discharge BP 133/62  Pulse 73  Temp(Src) 98.3 F (36.8 C) (Oral)  Resp 17  Ht 5\' 5"  (1.651 m)  Wt 136.4 kg (300 lb 11.3 oz)  BMI 50.04 kg/m2  SpO2 95%  Physical Exam: General: Alert and awake oriented x3 not in any acute  distress. CVS: S1-S2 clear no murmur rubs or gallops Chest: clear to auscultation bilaterally, no wheezing rales or rhonchi Abdomen: soft nondistended, normal bowel sounds, LLQ tenderness improved Extremities: no cyanosis, clubbing or edema noted bilaterally    The results of significant diagnostics from this hospitalization (including imaging, microbiology, ancillary and laboratory) are listed below for reference.    LAB RESULTS: Basic Metabolic Panel:  Recent Labs Lab 01/26/13 1415 01/27/13 0416  NA  140 139  K 3.3* 3.6  CL 100 101  CO2 30 28  GLUCOSE 118* 123*  BUN 11 10  CREATININE 0.80 0.82  CALCIUM 9.2 8.3*  MG  --  1.5  PHOS  --  3.4   Liver Function Tests:  Recent Labs Lab 01/26/13 1415 01/27/13 0416  AST 18 16  ALT 17 14  ALKPHOS 89 78  BILITOT 0.3 0.5  PROT 7.1 5.9*  ALBUMIN 3.4* 2.8*   No results found for this basename: LIPASE, AMYLASE,  in the last 168 hours No results found for this basename: AMMONIA,  in the last 168 hours CBC:  Recent Labs Lab 01/26/13 1415 01/27/13 0416  WBC 13.0* 11.6*  NEUTROABS 6.7  --   HGB 12.8 11.2*  HCT 39.1 32.7*  MCV 86.1 83.6  PLT 295 265   Cardiac Enzymes: No results found for this basename: CKTOTAL, CKMB, CKMBINDEX, TROPONINI,  in the last 168 hours BNP: No components found with this basename: POCBNP,  CBG:  Recent Labs Lab 01/30/13 0418 01/30/13 0749  GLUCAP 113* 111*    Significant Diagnostic Studies:  Ct Abdomen Pelvis W Contrast  01/26/2013  *RADIOLOGY REPORT*  Clinical Data: 62 year old female with abdominal and pelvic pain. History of recent diverticulitis.  CT ABDOMEN AND PELVIS WITH CONTRAST  Technique:  Multidetector CT imaging of the abdomen and pelvis was performed following the standard protocol during bolus administration of intravenous contrast.  Contrast: 50 ml intravenous Omnipaque-300  Comparison: 12/04/2012 and prior CTs dating back to 06/10/2006  Findings: Mild fatty infiltration of  the liver is again identified. No focal hepatic abnormalities are present. At least three gallstones are present, the largest measuring 2 cm. No CT evidence of acute cholecystitis identified. The spleen, pancreas, adrenal glands and kidneys are unremarkable except for A right renal cyst and five punctate nonobstructing mid renal calculus.  There is wall thickening inflammation of the proximal sigmoid colon compatible with diverticulitis and increased since the prior study. There is no evidence of focal abscess.  No gross evidence of pneumoperitoneum is identified.  There is no evidence of free fluid, abdominal aortic aneurysm, biliary dilatation or enlarged lymph nodes. The patient is status post hysterectomy. The bladder and appendix are unremarkable.  No acute or suspicious bony abnormalities are identified.  IMPRESSION: Moderate acute proximal sigmoid colonic diverticulitis without abscess or gross pneumoperitoneum.  This has increased since 12/04/2012.  Cholelithiasis without CT evidence of acute cholecystitis.  Fatty infiltration of the liver.   Original Report Authenticated By: Harmon Pier, M.D.     2D ECHO:   Disposition and Follow-up:     Discharge Orders   Future Appointments Provider Department Dept Phone   02/19/2013 10:45 AM Iva Boop, MD [facility] [phone]   Future Orders Complete By Expires     Discharge instructions  As directed     Comments:      Discharge diet: low fiber or soft diet. You can continue full liquid diet for a few days and advance to solids as tolerated.    Increase activity slowly  As directed         DISPOSITION: Home DIET: Low fiber or soft diet  ACTIVITY: As tolerated   DISCHARGE FOLLOW-UP Follow-up Information   Follow up with Stan Head, MD On 02/19/2013. (at 10:45AM)    Contact information:   [address] [place] [address] [phone]       Follow up with [name], MD. Schedule an appointment as  soon as possible for a visit in 10 days. (for hospital follow-up)    Contact information:   [address] [facility] [address] [phone]       Time spent on Discharge: 35 mins Signed:   Ronne Stefanski M.D. [facility] 01/30/2013, 10:09 AM Pager: [phone]

## 2013-02-01 ENCOUNTER — Ambulatory Visit (INDEPENDENT_AMBULATORY_CARE_PROVIDER_SITE_OTHER): Payer: 59 | Admitting: Internal Medicine

## 2013-02-01 ENCOUNTER — Encounter: Payer: Self-pay | Admitting: Internal Medicine

## 2013-02-01 VITALS — BP 130/84 | HR 84 | Temp 99.0°F | Wt 294.0 lb

## 2013-02-01 DIAGNOSIS — K5732 Diverticulitis of large intestine without perforation or abscess without bleeding: Secondary | ICD-10-CM

## 2013-02-01 NOTE — Patient Instructions (Addendum)
Patient is scheduled to see Dr. Abbey Chatters on 02/07/2013 at 2:00 pm, and is aware of this  Appointment. Keep appointment with Dr. [name] in April.

## 2013-02-02 DIAGNOSIS — K5732 Diverticulitis of large intestine without perforation or abscess without bleeding: Secondary | ICD-10-CM

## 2013-02-04 NOTE — Progress Notes (Signed)
  Subjective:    Patient ID: [name], female    DOB: 03-07-1951, 62 y.o.   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 Recurrent symptoms actually developed on the evening of March 14. She was seen in consultation by Dr. Erick Blinks, gastroenterologist who felt she needed consultation regarding possible surgery. She initially presented with acute diverticulitis on January 21 to my office. I treated her with Cipro and Flagyl after CT scan showed acute sigmoid diverticulitis. We saw her in followup January 28 and she was not significantly improved and was feeling nauseated. Nausea was felt to possibly be due to Flagyl. She was not taking Phenergan for nausea as prescribed. I switched her from Flagyl to clindamycin 300 mg every 6 hours. She continued to take that without issue or nausea. She called late January and wanted to return to work part time. FMLA form was completed for her. She was seen in followup-62-year-old February 4 doing much better. Abdomen exam on February 4 showed her to be nondistended without hepatosplenomegaly masses or significant tenderness. Patient was advised finish course of clindamycin. Appointment was arranged for her to see Dr. [name] for colonoscopy in a couple of months. That appointment is upcoming April 8.  She has multiple medical problems including morbid obesity, asthma, anxiety depression, GE reflux, hypertension, hyperlipidemia, type 2 diabetes mellitus, migraine headaches, hypothyroidism.    Recent hospitalization on March 15 through March 19 for IV antibiotic therapy for acute recurrent diverticulitis sigmoid colon.  Patient initially started seeing Dr. [name] January 2008 after being hospitalized for 3 days December 2007 with gastroenteritis symptoms and bright red blood mixed in or on the stool. Biopsy in 2008 showed 2  cecal ulcers. Crohn's disease was in the differential diagnosis. Recurrent hospitalization 04/12/2007 through 04/17/2007 with acute colitis. Colonoscopy showed left-sided colitis and she was treated with prednisone. Terminal ileum biopsies were normal.  Colonoscopy done 04/14/2008 showed segment of severe colitis and left colon. Biopsy done at that time showed ulcerations of colonic mucosa. Was treated with prednisone at that time. Also, has been treated with antispasmodics. Flexible sigmoidoscopy in October 2009 showed diverticulosis but no persistent colitis. Ischemic colitis was entertained as a diagnosis.    Review of Systems     Objective:   Physical Exam abdomen obese soft nondistended without hepatosplenomegaly masses. Some mild tenderness without rebound tenderness appreciated lower abdomen. Chest is clear to auscultation. Cardiac exam regular rate and rhythm.        Assessment & Plan:  Protracted acute diverticulitis with recurrence  Morbid obesity  Hypertension  Type 2 diabetes mellitus  Hyperlipidemia  Anxiety depression  History of ulcerated colonic mucosa 04/14/2008-differential that has been entertained included ischemic colitis and inflammatory bowel disease  GE reflux  Hypothyroidism  History of asthma  Plan: Surgical consultation with Dr. Abbey Chatters. Keep appointment with Dr. [name] on April 8. Patient is to be out of work next week. She requests we complete another set of FMLA forms. Discussed with patient at length the possibility of having had colon resection to prevent further recurrence. She is not pleased to hear about this but is willing to see surgeon for consultation. Time spent with patient 25 minutes

## 2013-02-07 ENCOUNTER — Encounter (INDEPENDENT_AMBULATORY_CARE_PROVIDER_SITE_OTHER): Payer: Self-pay | Admitting: General Surgery

## 2013-02-07 ENCOUNTER — Ambulatory Visit (INDEPENDENT_AMBULATORY_CARE_PROVIDER_SITE_OTHER): Payer: Commercial Managed Care - PPO | Admitting: General Surgery

## 2013-02-07 VITALS — BP 130/82 | HR 92 | Resp 16 | Ht 65.5 in | Wt 292.6 lb

## 2013-02-07 DIAGNOSIS — K5732 Diverticulitis of large intestine without perforation or abscess without bleeding: Secondary | ICD-10-CM

## 2013-02-07 NOTE — Progress Notes (Signed)
Patient ID: [name], female   DOB: Jun 03, 1951, 62 y.o.   MRN: [id number]  No chief complaint on file.   HPI [name] is a 62 y.o. female.   HPI  She is referred by Dr. [name] for evaluation of recurrent sigmoid diverticulitis.  She had an episode in January of this year was treated with oral antibiotics. She ended requiring hospitalization earlier this month for a recurrent episode. CT scan demonstrated inflammatory changes in the area of the sigmoid colon. In the past, she's had a nonspecific colitis noted on the left side. She required hospitalization for this. Initially was thought she might have ischemic colitis. She is also due to see Dr. [name] in followup on April 8.  Past Medical History  Diagnosis Date  . Thyroid disease   . Migraine   . Diabetes mellitus   . Asthma   . Diverticulitis   . Diverticulosis     Past Surgical History  Procedure Laterality Date  . Cesarean section    . Abdominal hysterectomy    . Knee cartilage surgery      Left  . Cataract extraction, bilateral      Family History  Problem Relation Age of Onset  . Heart disease Mother   . Kidney failure Father   . Diabetes Father   . Breast cancer Paternal Aunt     Social History History  Substance Use Topics  . Smoking status: Former Games developer  . Smokeless tobacco: Not on file     Comment: 25 years ago  . Alcohol Use: No    Allergies  Allergen Reactions  . Levofloxacin Anaphylaxis    Possibly Levaquin or Lisinopril, pt was taking both at the time of reaction  . Lisinopril Anaphylaxis    Possibly Lisinopril or Levaquin, pt was taking both at the time of reaction  . Bee Venom Swelling  . Morphine And Related Itching    Current Outpatient Prescriptions  Medication Sig Dispense Refill  . albuterol (PROVENTIL HFA;VENTOLIN HFA) 108 (90 BASE) MCG/ACT inhaler Inhale 2 puffs into the lungs every 6 (six) hours as needed for wheezing. For wheezing      . ALPRAZolam (XANAX) 0.5 MG  tablet Take 0.5 mg by mouth 2 (two) times daily as needed for sleep. For sleep/anxiety      . amoxicillin-clavulanate (AUGMENTIN) 875-125 MG per tablet Take 1 tablet by mouth 2 (two) times daily. X 2 weeks  28 tablet  0  . aspirin 81 MG tablet Take 81 mg by mouth daily.        Marland Kitchen aspirin-acetaminophen-caffeine (EXCEDRIN MIGRAINE) 250-250-65 MG per tablet Take 1 tablet by mouth every 6 (six) hours as needed. For pain/headache      . cetirizine (ZYRTEC) 10 MG tablet Take 10 mg by mouth at bedtime.       . Cholecalciferol (VITAMIN D) 2000 UNITS tablet Take 2,000 Units by mouth daily.        . diphenhydrAMINE (BENADRYL) 25 MG tablet Take 25 mg by mouth daily as needed for allergies. For nasal congestion and allergy symptoms      . esomeprazole (NEXIUM) 40 MG capsule Take 40 mg by mouth daily before breakfast.       . famotidine (PEPCID) 20 MG tablet Take 20 mg by mouth at bedtime.        . Fluticasone-Salmeterol (ADVAIR) 250-50 MCG/DOSE AEPB Inhale 1 puff into the lungs 2 (two) times daily as needed. For seasonal asthma      .  furosemide (LASIX) 40 MG tablet Take 40 mg by mouth daily.       . hyoscyamine (LEVBID) 0.375 MG 12 hr tablet Take 0.375 mg by mouth every morning.       Marland Kitchen levothyroxine (SYNTHROID) 175 MCG tablet Take 175 mcg by mouth daily.       Marland Kitchen losartan (COZAAR) 50 MG tablet Take 50 mg by mouth daily.      . metoprolol tartrate (LOPRESSOR) 25 MG tablet Take 25 mg by mouth every morning.       . promethazine (PHENERGAN) 12.5 MG tablet Take 1 tablet (12.5 mg total) by mouth every 6 (six) hours as needed for nausea.  30 tablet  0  . pseudoephedrine (SUDAFED) 30 MG tablet Take 30 mg by mouth daily as needed for congestion. For nasal congestion      . senna-docusate (SENOKOT S) 8.6-50 MG per tablet Take 2 tablets by mouth at bedtime as needed for constipation.  60 tablet  3  . simvastatin (ZOCOR) 10 MG tablet Take 10 mg by mouth at bedtime.      . traMADol (ULTRAM) 50 MG tablet Take 1 tablet (50  mg total) by mouth every 6 (six) hours as needed for pain.  45 tablet  3  . traZODone (DESYREL) 150 MG tablet Take 150 mg by mouth at bedtime.      . vitamin B-12 (CYANOCOBALAMIN) 100 MCG tablet Take 100 mcg by mouth every morning.       . zileuton (ZYFLO CR) 600 MG CR tablet Take 1,200 mg by mouth 2 (two) times daily.      Marland Kitchen zolpidem (AMBIEN) 10 MG tablet Take 10 mg by mouth at bedtime.       No current facility-administered medications for this visit.    Review of Systems Review of Systems  Constitutional: Negative.   HENT: Positive for rhinorrhea.   Respiratory: Negative.   Cardiovascular: Negative.   Gastrointestinal: Positive for abdominal pain.       Reflux  Allergic/Immunologic: Negative.   Hematological: Negative.     Blood pressure 130/82, pulse 92, resp. rate 16, height 5' 5.5" (1.664 m), weight 292 lb 9.6 oz (132.722 kg).  Physical Exam Physical Exam  Constitutional: No distress.  Obese.  HENT:  Head: Normocephalic and atraumatic.  Eyes: EOM are normal. No scleral icterus.  Wears glasses.  Neck: Neck supple.  Cardiovascular: Normal rate and regular rhythm.   Pulmonary/Chest: Effort normal and breath sounds normal.  Abdominal: Soft. She exhibits no mass. There is tenderness (mild in LLQ). There is no guarding.  Musculoskeletal: She exhibits edema (small amount of LE edema).  Lymphadenopathy:    She has no cervical adenopathy.  Skin: Skin is warm and dry.    Data Reviewed Notes in EPIC  Assessment    Recurrent sigmoid diverticulitis. Also has a history of possibly ischemic colitis on the left side. This is in the past.     Plan    We discussed laparoscopic possible open partial colectomy as treatment for her recurrent sigmoid diverticulitis. She is due to see Dr. [name] in the near future and may end up needing another colonoscopy which might be helpful with his situation. Once she has seen him, we can talk further.  I have explained the procedure and  risks of colon resection.  Risks include but are not limited to bleeding, infection, wound problems, anesthesia, anastomotic leak, need for colostomy, injury to intraabominal organs (such as intestine, spleen, kidney, bladder, ureter, etc.), ileus, irregular  bowel habits.  She seems to understand.        Yavonne Kiss J 02/07/2013, 2:51 PM

## 2013-02-07 NOTE — Patient Instructions (Addendum)
Try a high fiber diet. We will talk after you have seen Dr. [name].

## 2013-02-14 ENCOUNTER — Encounter (HOSPITAL_COMMUNITY): Payer: Self-pay | Admitting: *Deleted

## 2013-02-14 ENCOUNTER — Inpatient Hospital Stay (HOSPITAL_COMMUNITY)
Admission: EM | Admit: 2013-02-14 | Discharge: 2013-02-21 | DRG: 392 | Disposition: A | Discharge status: Home / Self Care | Payer: 59 | Attending: Internal Medicine | Admitting: Internal Medicine

## 2013-02-14 ENCOUNTER — Emergency Department (HOSPITAL_COMMUNITY): Payer: 59

## 2013-02-14 DIAGNOSIS — K219 Gastro-esophageal reflux disease without esophagitis: Secondary | ICD-10-CM | POA: Diagnosis present

## 2013-02-14 DIAGNOSIS — Z8719 Personal history of other diseases of the digestive system: Secondary | ICD-10-CM

## 2013-02-14 DIAGNOSIS — Z8669 Personal history of other diseases of the nervous system and sense organs: Secondary | ICD-10-CM

## 2013-02-14 DIAGNOSIS — F411 Generalized anxiety disorder: Secondary | ICD-10-CM | POA: Diagnosis present

## 2013-02-14 DIAGNOSIS — G43909 Migraine, unspecified, not intractable, without status migrainosus: Secondary | ICD-10-CM | POA: Diagnosis present

## 2013-02-14 DIAGNOSIS — F3289 Other specified depressive episodes: Secondary | ICD-10-CM | POA: Diagnosis present

## 2013-02-14 DIAGNOSIS — Z79899 Other long term (current) drug therapy: Secondary | ICD-10-CM

## 2013-02-14 DIAGNOSIS — R1311 Dysphagia, oral phase: Secondary | ICD-10-CM | POA: Diagnosis present

## 2013-02-14 DIAGNOSIS — E876 Hypokalemia: Secondary | ICD-10-CM | POA: Diagnosis not present

## 2013-02-14 DIAGNOSIS — F329 Major depressive disorder, single episode, unspecified: Secondary | ICD-10-CM

## 2013-02-14 DIAGNOSIS — J45909 Unspecified asthma, uncomplicated: Secondary | ICD-10-CM | POA: Diagnosis present

## 2013-02-14 DIAGNOSIS — Z87891 Personal history of nicotine dependence: Secondary | ICD-10-CM

## 2013-02-14 DIAGNOSIS — Z90710 Acquired absence of both cervix and uterus: Secondary | ICD-10-CM | POA: Diagnosis present

## 2013-02-14 DIAGNOSIS — E039 Hypothyroidism, unspecified: Secondary | ICD-10-CM | POA: Diagnosis present

## 2013-02-14 DIAGNOSIS — K5732 Diverticulitis of large intestine without perforation or abscess without bleeding: Principal | ICD-10-CM | POA: Diagnosis present

## 2013-02-14 DIAGNOSIS — I10 Essential (primary) hypertension: Secondary | ICD-10-CM | POA: Diagnosis present

## 2013-02-14 DIAGNOSIS — F32A Depression, unspecified: Secondary | ICD-10-CM | POA: Diagnosis present

## 2013-02-14 DIAGNOSIS — K5730 Diverticulosis of large intestine without perforation or abscess without bleeding: Secondary | ICD-10-CM

## 2013-02-14 DIAGNOSIS — E119 Type 2 diabetes mellitus without complications: Secondary | ICD-10-CM | POA: Diagnosis present

## 2013-02-14 HISTORY — DX: Essential (primary) hypertension: I10

## 2013-02-14 LAB — CBC WITH DIFFERENTIAL/PLATELET
Basophils Absolute: 0.1 10*3/uL (ref 0.0–0.1)
Basophils Relative: 1 % (ref 0–1)
Eosinophils Relative: 2 % (ref 0–5)
HCT: 41.8 % (ref 36.0–46.0)
Lymphocytes Relative: 27 % (ref 12–46)
MCH: 28.4 pg (ref 26.0–34.0)
MCHC: 34.2 g/dL (ref 30.0–36.0)
MCV: 82.9 fL (ref 78.0–100.0)
Monocytes Absolute: 1.7 10*3/uL — ABNORMAL HIGH (ref 0.1–1.0)
RDW: 13.7 % (ref 11.5–15.5)

## 2013-02-14 LAB — URINALYSIS, ROUTINE W REFLEX MICROSCOPIC
Bilirubin Urine: NEGATIVE
Hgb urine dipstick: NEGATIVE
Protein, ur: NEGATIVE mg/dL
Urobilinogen, UA: 0.2 mg/dL (ref 0.0–1.0)

## 2013-02-14 LAB — COMPREHENSIVE METABOLIC PANEL
AST: 28 U/L (ref 0–37)
CO2: 29 mEq/L (ref 19–32)
Calcium: 9.6 mg/dL (ref 8.4–10.5)
Creatinine, Ser: 0.82 mg/dL (ref 0.50–1.10)
GFR calc Af Amer: 88 mL/min — ABNORMAL LOW (ref >90)
GFR calc non Af Amer: 76 mL/min — ABNORMAL LOW (ref >90)

## 2013-02-14 MED ORDER — HYDROMORPHONE BOLUS VIA INFUSION: 1.0000 mg | Freq: Once | INTRAVENOUS | Status: DC

## 2013-02-14 MED ORDER — LORATADINE 10 MG PO TABS
10.0000 mg | ORAL_TABLET | Freq: Every day | ORAL | Status: DC
  Administered 2013-02-15 – 2013-02-21 (×7): 10 mg via ORAL
  Filled 2013-02-14 (×8): qty 1

## 2013-02-14 MED ORDER — SIMVASTATIN 10 MG PO TABS
10.0000 mg | ORAL_TABLET | Freq: Every day | ORAL | Status: DC
  Administered 2013-02-15 – 2013-02-20 (×6): 10 mg via ORAL
  Filled 2013-02-14 (×7): qty 1

## 2013-02-14 MED ORDER — KCL IN DEXTROSE-NACL 20-5-0.9 MEQ/L-%-% IV SOLN
INTRAVENOUS | Status: DC
  Administered 2013-02-15 – 2013-02-19 (×8): via INTRAVENOUS
  Filled 2013-02-14 (×13): qty 1000

## 2013-02-14 MED ORDER — PIPERACILLIN-TAZOBACTAM 3.375 G IVPB 30 MIN
3.3750 g | Freq: Three times a day (TID) | INTRAVENOUS | Status: DC
  Administered 2013-02-15 – 2013-02-19 (×13): 3.375 g via INTRAVENOUS
  Filled 2013-02-14 (×16): qty 50

## 2013-02-14 MED ORDER — ONDANSETRON HCL 4 MG/2ML IJ SOLN
4.0000 mg | Freq: Four times a day (QID) | INTRAMUSCULAR | Status: DC | PRN
  Administered 2013-02-15 – 2013-02-19 (×4): 4 mg via INTRAVENOUS

## 2013-02-14 MED ORDER — TRAZODONE HCL 150 MG PO TABS
150.0000 mg | ORAL_TABLET | Freq: Every day | ORAL | Status: DC
  Administered 2013-02-15 – 2013-02-20 (×8): 150 mg via ORAL
  Filled 2013-02-14 (×8): qty 1

## 2013-02-14 MED ORDER — PIPERACILLIN-TAZOBACTAM 3.375 G IVPB
3.3750 g | Freq: Once | INTRAVENOUS | Status: AC
  Administered 2013-02-14: 3.375 g via INTRAVENOUS
  Filled 2013-02-14: qty 50

## 2013-02-14 MED ORDER — HYDROMORPHONE HCL PF 1 MG/ML IJ SOLN
0.5000 mg | INTRAMUSCULAR | Status: DC | PRN
  Administered 2013-02-15: 1 mg via INTRAVENOUS
  Filled 2013-02-14 (×2): qty 1

## 2013-02-14 MED ORDER — LOSARTAN POTASSIUM 50 MG PO TABS
50.0000 mg | ORAL_TABLET | Freq: Every morning | ORAL | Status: DC
  Filled 2013-02-14: qty 1

## 2013-02-14 MED ORDER — FAMOTIDINE 20 MG PO TABS
20.0000 mg | ORAL_TABLET | Freq: Every day | ORAL | Status: DC
  Administered 2013-02-15 – 2013-02-20 (×7): 20 mg via ORAL
  Filled 2013-02-14 (×8): qty 1

## 2013-02-14 MED ORDER — ASPIRIN 81 MG PO TABS: 81.0000 mg | ORAL_TABLET | Freq: Every day | ORAL | Status: DC

## 2013-02-14 MED ORDER — ZOLPIDEM TARTRATE 5 MG PO TABS
5.0000 mg | ORAL_TABLET | Freq: Every day | ORAL | Status: DC
  Administered 2013-02-15 – 2013-02-20 (×6): 5 mg via ORAL
  Filled 2013-02-14 (×6): qty 1

## 2013-02-14 MED ORDER — ZILEUTON ER 600 MG PO TB12: 1200.0000 mg | ORAL_TABLET | Freq: Every morning | ORAL | Status: DC

## 2013-02-14 MED ORDER — HYDROMORPHONE HCL PF 1 MG/ML IJ SOLN
1.0000 mg | Freq: Once | INTRAMUSCULAR | Status: AC
  Administered 2013-02-14: 1 mg via INTRAVENOUS
  Filled 2013-02-14: qty 1

## 2013-02-14 MED ORDER — PANTOPRAZOLE SODIUM 40 MG PO TBEC
80.0000 mg | DELAYED_RELEASE_TABLET | Freq: Every day | ORAL | Status: DC
  Administered 2013-02-15 – 2013-02-21 (×7): 80 mg via ORAL
  Filled 2013-02-14: qty 1
  Filled 2013-02-14 (×2): qty 2
  Filled 2013-02-14 (×2): qty 1
  Filled 2013-02-14 (×3): qty 2
  Filled 2013-02-14: qty 1

## 2013-02-14 MED ORDER — MOMETASONE FURO-FORMOTEROL FUM 100-5 MCG/ACT IN AERO
2.0000 | INHALATION_SPRAY | Freq: Two times a day (BID) | RESPIRATORY_TRACT | Status: DC
  Administered 2013-02-15 – 2013-02-21 (×10): 2 via RESPIRATORY_TRACT
  Filled 2013-02-14: qty 8.8

## 2013-02-14 MED ORDER — ASPIRIN 81 MG PO CHEW
81.0000 mg | CHEWABLE_TABLET | Freq: Every day | ORAL | Status: DC
  Administered 2013-02-15 – 2013-02-21 (×7): 81 mg via ORAL
  Filled 2013-02-14 (×8): qty 1

## 2013-02-14 MED ORDER — MORPHINE SULFATE 2 MG/ML IJ SOLN: 2.0000 mg | Freq: Once | INTRAMUSCULAR | Status: DC

## 2013-02-14 MED ORDER — ZOLPIDEM TARTRATE 5 MG PO TABS: 10.0000 mg | ORAL_TABLET | Freq: Every day | ORAL | Status: DC

## 2013-02-14 MED ORDER — VITAMIN B-12 100 MCG PO TABS
100.0000 ug | ORAL_TABLET | Freq: Every morning | ORAL | Status: DC
  Administered 2013-02-15 – 2013-02-21 (×7): 100 ug via ORAL
  Filled 2013-02-14 (×7): qty 1

## 2013-02-14 MED ORDER — MONTELUKAST SODIUM 10 MG PO TABS
10.0000 mg | ORAL_TABLET | Freq: Every day | ORAL | Status: DC
  Administered 2013-02-15 – 2013-02-21 (×7): 10 mg via ORAL
  Filled 2013-02-14 (×8): qty 1

## 2013-02-14 MED ORDER — ALBUTEROL SULFATE HFA 108 (90 BASE) MCG/ACT IN AERS
2.0000 | INHALATION_SPRAY | Freq: Four times a day (QID) | RESPIRATORY_TRACT | Status: DC | PRN
  Filled 2013-02-14: qty 6.7

## 2013-02-14 MED ORDER — ALPRAZOLAM 0.5 MG PO TABS
0.5000 mg | ORAL_TABLET | Freq: Two times a day (BID) | ORAL | Status: DC | PRN
  Administered 2013-02-15: 0.5 mg via ORAL
  Filled 2013-02-14: qty 1

## 2013-02-14 MED ORDER — VITAMIN D 50 MCG (2000 UT) PO TABS: 2000.0000 [IU] | ORAL_TABLET | Freq: Every day | ORAL | Status: DC

## 2013-02-14 MED ORDER — ONDANSETRON HCL 4 MG PO TABS
4.0000 mg | ORAL_TABLET | Freq: Four times a day (QID) | ORAL | Status: DC | PRN
  Administered 2013-02-20: 4 mg via ORAL
  Filled 2013-02-14: qty 1

## 2013-02-14 MED ORDER — VITAMIN D3 25 MCG (1000 UNIT) PO TABS
2000.0000 [IU] | ORAL_TABLET | Freq: Every day | ORAL | Status: DC
  Administered 2013-02-15 – 2013-02-21 (×7): 2000 [IU] via ORAL
  Filled 2013-02-14 (×7): qty 2

## 2013-02-14 MED ORDER — IOHEXOL 300 MG/ML  SOLN
50.0000 mL | Freq: Once | INTRAMUSCULAR | Status: AC | PRN
  Administered 2013-02-14: 50 mL via ORAL

## 2013-02-14 MED ORDER — HYDROMORPHONE HCL PF 1 MG/ML IJ SOLN
1.0000 mg | INTRAMUSCULAR | Status: DC | PRN
  Administered 2013-02-14 – 2013-02-18 (×7): 1 mg via INTRAVENOUS
  Filled 2013-02-14 (×7): qty 1

## 2013-02-14 MED ORDER — METOPROLOL TARTRATE 25 MG PO TABS
25.0000 mg | ORAL_TABLET | Freq: Every morning | ORAL | Status: DC
  Administered 2013-02-15 – 2013-02-21 (×7): 25 mg via ORAL
  Filled 2013-02-14 (×8): qty 1

## 2013-02-14 MED ORDER — IOHEXOL 300 MG/ML  SOLN
100.0000 mL | Freq: Once | INTRAMUSCULAR | Status: AC | PRN
  Administered 2013-02-14: 100 mL via INTRAVENOUS

## 2013-02-14 MED ORDER — ONDANSETRON HCL 4 MG/2ML IJ SOLN
4.0000 mg | Freq: Four times a day (QID) | INTRAMUSCULAR | Status: DC | PRN
  Administered 2013-02-14 – 2013-02-15 (×2): 4 mg via INTRAVENOUS
  Filled 2013-02-14 (×6): qty 2

## 2013-02-14 MED ORDER — LEVOTHYROXINE SODIUM 175 MCG PO TABS
175.0000 ug | ORAL_TABLET | Freq: Every day | ORAL | Status: DC
  Administered 2013-02-15 – 2013-02-18 (×4): 175 ug via ORAL
  Filled 2013-02-14 (×5): qty 1

## 2013-02-14 MED ORDER — ENOXAPARIN SODIUM 80 MG/0.8ML ~~LOC~~ SOLN
65.0000 mg | SUBCUTANEOUS | Status: DC
  Administered 2013-02-15 – 2013-02-20 (×6): 65 mg via SUBCUTANEOUS
  Filled 2013-02-14 (×10): qty 0.8

## 2013-02-14 MED ORDER — ENOXAPARIN SODIUM 40 MG/0.4ML ~~LOC~~ SOLN: 40.0000 mg | SUBCUTANEOUS | Status: DC

## 2013-02-14 NOTE — ED Notes (Signed)
Pt d/c'd 2 weeks prior from hospital after being tx for diverticulitis.  Since then pt has been in constant pain.  Pain to LLQ and now experiencing new pain to L sided of abd that she describes as soreness.  Denies bowel or bladder s/s.

## 2013-02-14 NOTE — ED Notes (Signed)
Called CT to notify finished with contrast.

## 2013-02-14 NOTE — ED Provider Notes (Signed)
History     CSN: [id number]  Arrival date & time 02/14/13  1442   First MD Initiated Contact with Patient 02/14/13 1603      No chief complaint on file.   (Consider location/radiation/quality/duration/timing/severity/associated sxs/prior treatment) HPI 62 year old female recently discharged 3/19 after hospital admission for diverticulitis, discharged on PO Augmentin x 14 days, took last pill this morning.  She says her pain has been worse again x 4 days.  She had called and made an appointment with her PCP, but PCP called her today and instructed her to go to the ER.  Patient rates pain as 7/10 and localizes to LLQ.  She denies fevers.  She says she did have some loose stools yesterday but no blood in stool. No nausea or vomiting, but she does endorse poor appetite.    Past Medical History  Diagnosis Date  . Migraine   . Diabetes mellitus   . Asthma   . Diverticulitis   . Diverticulosis   . Thyroid disease     hypo    Past Surgical History  Procedure Laterality Date  . Cesarean section    . Abdominal hysterectomy    . Knee cartilage surgery      Left  . Cataract extraction, bilateral    . Colonoscopy      multiple   . Esophagogastroduodenoscopy endoscopy      multiple    Family History  Problem Relation Age of Onset  . Heart disease Mother   . Kidney failure Father   . Diabetes Father   . Breast cancer Paternal Aunt     History  Substance Use Topics  . Smoking status: Former Games developer  . Smokeless tobacco: Not on file     Comment: 25 years ago  . Alcohol Use: No   Review of Systems  Allergies  Levofloxacin; Lisinopril; Bee venom; and Morphine and related  Home Medications   Current Outpatient Rx  Name  Route  Sig  Dispense  Refill  . albuterol (PROVENTIL HFA;VENTOLIN HFA) 108 (90 BASE) MCG/ACT inhaler   Inhalation   Inhale 2 puffs into the lungs every 6 (six) hours as needed for wheezing. For wheezing         . ALPRAZolam (XANAX) 0.5 MG tablet    Oral   Take 0.5 mg by mouth 2 (two) times daily as needed for sleep. For sleep/anxiety         . amoxicillin-clavulanate (AUGMENTIN) 875-125 MG per tablet   Oral   Take 1 tablet by mouth 2 (two) times daily. X 2 weeks   28 tablet   0   . aspirin 81 MG tablet   Oral   Take 81 mg by mouth daily.           Marland Kitchen aspirin-acetaminophen-caffeine (EXCEDRIN MIGRAINE) 250-250-65 MG per tablet   Oral   Take 1 tablet by mouth every 6 (six) hours as needed. For pain/headache         . cetirizine (ZYRTEC) 10 MG tablet   Oral   Take 10 mg by mouth at bedtime.          . Cholecalciferol (VITAMIN D) 2000 UNITS tablet   Oral   Take 2,000 Units by mouth daily.           . diphenhydrAMINE (BENADRYL) 25 MG tablet   Oral   Take 25 mg by mouth daily as needed for allergies. For nasal congestion and allergy symptoms         .  esomeprazole (NEXIUM) 40 MG capsule   Oral   Take 40 mg by mouth daily before breakfast.          . famotidine (PEPCID) 20 MG tablet   Oral   Take 20 mg by mouth at bedtime.           . Fluticasone-Salmeterol (ADVAIR) 250-50 MCG/DOSE AEPB   Inhalation   Inhale 1 puff into the lungs 2 (two) times daily as needed. For seasonal asthma         . furosemide (LASIX) 40 MG tablet   Oral   Take 40 mg by mouth daily.          . hyoscyamine (LEVBID) 0.375 MG 12 hr tablet   Oral   Take 0.375 mg by mouth every morning.          Marland Kitchen levothyroxine (SYNTHROID) 175 MCG tablet   Oral   Take 175 mcg by mouth daily.          Marland Kitchen losartan (COZAAR) 50 MG tablet   Oral   Take 50 mg by mouth daily.         . metoprolol tartrate (LOPRESSOR) 25 MG tablet   Oral   Take 25 mg by mouth every morning.          . promethazine (PHENERGAN) 12.5 MG tablet   Oral   Take 1 tablet (12.5 mg total) by mouth every 6 (six) hours as needed for nausea.   30 tablet   0   . pseudoephedrine (SUDAFED) 30 MG tablet   Oral   Take 30 mg by mouth daily as needed for congestion.  For nasal congestion         . senna-docusate (SENOKOT S) 8.6-50 MG per tablet   Oral   Take 2 tablets by mouth at bedtime as needed for constipation.   60 tablet   3   . simvastatin (ZOCOR) 10 MG tablet   Oral   Take 10 mg by mouth at bedtime.         . traMADol (ULTRAM) 50 MG tablet   Oral   Take 1 tablet (50 mg total) by mouth every 6 (six) hours as needed for pain.   45 tablet   3   . traZODone (DESYREL) 150 MG tablet   Oral   Take 150 mg by mouth at bedtime.         . vitamin B-12 (CYANOCOBALAMIN) 100 MCG tablet   Oral   Take 100 mcg by mouth every morning.          . zileuton (ZYFLO CR) 600 MG CR tablet   Oral   Take 1,200 mg by mouth 2 (two) times daily.         Marland Kitchen zolpidem (AMBIEN) 10 MG tablet   Oral   Take 10 mg by mouth at bedtime.           BP 135/79  Pulse 79  Temp(Src) 98 F (36.7 C) (Oral)  Resp 16  SpO2 98%  Physical Exam General appearance: alert and cooperative, uncomfortable Back: symmetric, no curvature. ROM normal. No CVA tenderness. Lungs: clear to auscultation bilaterally Heart: regular rate and rhythm, S1, S2 normal, no murmur, click, rub or gallop Abdomen: Diffusely tender, worst in LLQ, no rebound Extremities: extremities normal, atraumatic, no cyanosis or edema Pulses: 2+ and symmetric    ED Course  Procedures (including critical care time)  Labs Reviewed  CBC WITH DIFFERENTIAL - Abnormal; Notable for the following:  WBC 17.2 (*)    Neutro Abs 10.5 (*)    Lymphs Abs 4.6 (*)    Monocytes Absolute 1.7 (*)    All other components within normal limits  COMPREHENSIVE METABOLIC PANEL - Abnormal; Notable for the following:    Chloride 95 (*)    Glucose, Bld 125 (*)    GFR calc non Af Amer 76 (*)    GFR calc Af Amer 88 (*)    All other components within normal limits  URINALYSIS, ROUTINE W REFLEX MICROSCOPIC   Ct Abdomen Pelvis W Contrast  02/14/2013  *RADIOLOGY REPORT*  Clinical Data: Left sided abdominal pain   CT ABDOMEN AND PELVIS WITH CONTRAST  Technique:  Multidetector CT imaging of the abdomen and pelvis was performed following the standard protocol during bolus administration of intravenous contrast.  Contrast: OMNIPAQUE IOHEXOL 300 MG/ML  SOLN  Comparison: 01/26/2013  Findings: Inflammatory changes of the proximal descending colon have developed associated with diverticuli.  There is stranding in the adjacent retroperitoneal fat and wall thickening.  No extraluminal bowel gas.  No evidence of perforation.  Inflammatory changes and wall thickening of the sigmoid colon have improved. Again, there is no evidence of extraluminal bowel gas or abscess formation.  Otherwise stable study.  IMPRESSION: Improved sigmoid diverticulitis.  New acute diverticulitis in the proximal descending colon.  No evidence of perforation or abscess.   Original Report Authenticated By: Jolaine Click, M.D.      1. Diverticulitis large intestine       MDM  62 year old female with recent hospitalization for diverticulitis presents with worsening abdominal pain, found to have leukocytosis in ER - Will order CT Abdomen and pelvis to evaluate for worsening diverticulitis/infection.   CT Scan showing Acute diverticulitis in Proximal Descending colon, without perforation or abscess.   - Will start Zosyn (pt allergic to Flouroquinolones)  - Will admit to Hospitalist service for IV antibiotics.       Ardyth Gal, MD 02/14/13 2024

## 2013-02-14 NOTE — ED Provider Notes (Signed)
I saw and evaluated the patient, reviewed the resident's note and I agree with the findings and plan.  Pt with new onset LUQ/LLQ abd pain similar to prev episodes of diverticulitis. VS stable. Elevated WBC's. CT with evidence of acute diverticulitis. Admit to [facility].   Loren Racer, MD 02/14/13 [phone]

## 2013-02-14 NOTE — H&P (Signed)
[facility] History and Physical  GLINDA NATZKE ZOX:[id number] DOB: [birth year]    PCP:   [name], MD   Chief Complaint: Left lower quadrant pain.  HPI: CHARMAIN DIOSDADO is an 62 y.o. female with history of recent sigmoid the colitis, admitted to the hospital and was treated with antibiotic with improvement of her symptoms, returned to the emergency room as she had recurrence of her left lower quadrant pain. She denied any fever, chills, black stool bloody stool, nausea or vomiting. She stated that she has just finished Augmentin today. She sees Dr. [name] as her gastroenterologist, and had questionable history of ulcerative colitis. Her last hospitalization, it was thought that she might have ischemic colitis as well although this was not confirmed.  She stated her last colonoscopy was about 5 years ago. Furthermore, she actually had seen Dr. Vick Frees last week for a consultation. Workup in emergency room included leukocytosis with a white count of 17,000. A repeat abdominal CT showed improvement of her sigmoid diverticulitis, unfortunately, she developed no diverticulitis of the descending colon. She does have diabetes, diet controlled. She is also allergic to quinolone.  Rewiew of Systems:  Constitutional: Negative for malaise, fever and chills. No significant weight loss or weight gain Eyes: Negative for eye pain, redness and discharge, diplopia, visual changes, or flashes of light. ENMT: Negative for ear pain, hoarseness, nasal congestion, sinus pressure and sore throat. No headaches; tinnitus, drooling, or problem swallowing. Cardiovascular: Negative for chest pain, palpitations, diaphoresis, dyspnea and peripheral edema. ; No orthopnea, PND Respiratory: Negative for cough, hemoptysis, wheezing and stridor. No pleuritic chestpain. Gastrointestinal: Negative for nausea, vomiting, diarrhea, constipation, melena, blood in stool, hematemesis, jaundice and rectal bleeding.     Genitourinary: Negative for frequency, dysuria, incontinence,flank pain and hematuria; Musculoskeletal: Negative for back pain and neck pain. Negative for swelling and trauma.;  Skin: . Negative for pruritus, rash, abrasions, bruising and skin lesion.; ulcerations Neuro: Negative for headache, lightheadedness and neck stiffness. Negative for weakness, altered level of consciousness , altered mental status, extremity weakness, burning feet, involuntary movement, seizure and syncope.  Psych: negative for anxiety, depression, insomnia, tearfulness, panic attacks, hallucinations, paranoia, suicidal or homicidal ideation.   Past Medical History  Diagnosis Date  . Migraine   . Diabetes mellitus   . Asthma   . Diverticulitis   . Diverticulosis   . Thyroid disease     hypo  . Hypertension     Past Surgical History  Procedure Laterality Date  . Cesarean section    . Abdominal hysterectomy    . Knee cartilage surgery      Left  . Cataract extraction, bilateral    . Colonoscopy      multiple   . Esophagogastroduodenoscopy endoscopy      multiple    Medications:  HOME MEDS: Prior to Admission medications   Medication Sig Start Date End Date Taking? Authorizing Provider  albuterol (PROVENTIL HFA;VENTOLIN HFA) 108 (90 BASE) MCG/ACT inhaler Inhale 2 puffs into the lungs every 6 (six) hours as needed for wheezing. For wheezing   Yes Historical Provider, MD  ALPRAZolam Prudy Feeler) 0.5 MG tablet Take 0.5 mg by mouth 2 (two) times daily as needed for sleep. For sleep/anxiety 07/19/12  Yes [name], MD  amoxicillin-clavulanate (AUGMENTIN) 875-125 MG per tablet Take 1 tablet by mouth 2 (two) times daily. X 2 weeks 01/30/13  Yes Ripudeep Jenna Luo, MD  aspirin 81 MG tablet Take 81 mg by mouth daily.  Yes Historical Provider, MD  aspirin-acetaminophen-caffeine (EXCEDRIN MIGRAINE) 650-090-9994 MG per tablet Take 1 tablet by mouth every 6 (six) hours as needed. For pain/headache   Yes Historical  Provider, MD  cetirizine (ZYRTEC) 10 MG tablet Take 10 mg by mouth at bedtime.    Yes Historical Provider, MD  Cholecalciferol (VITAMIN D) 2000 UNITS tablet Take 2,000 Units by mouth daily.     Yes Historical Provider, MD  esomeprazole (NEXIUM) 40 MG capsule Take 40 mg by mouth daily before breakfast.    Yes Historical Provider, MD  famotidine (PEPCID) 20 MG tablet Take 20 mg by mouth at bedtime.     Yes Historical Provider, MD  Fluticasone-Salmeterol (ADVAIR) 250-50 MCG/DOSE AEPB Inhale 1 puff into the lungs 2 (two) times daily as needed. For seasonal asthma   Yes Historical Provider, MD  furosemide (LASIX) 40 MG tablet Take 40 mg by mouth daily.    Yes Historical Provider, MD  hyoscyamine (LEVBID) 0.375 MG 12 hr tablet Take 0.375 mg by mouth every morning.    Yes Historical Provider, MD  levothyroxine (SYNTHROID) 175 MCG tablet Take 175 mcg by mouth daily.  12/05/11 05/04/13 Yes [name], MD  losartan (COZAAR) 50 MG tablet Take 50 mg by mouth every morning.    Yes Historical Provider, MD  metoprolol tartrate (LOPRESSOR) 25 MG tablet Take 25 mg by mouth every morning.    Yes Historical Provider, MD  polyethylene glycol powder (GLYCOLAX/MIRALAX) powder Take 17 g by mouth every morning.   Yes Historical Provider, MD  simvastatin (ZOCOR) 10 MG tablet Take 10 mg by mouth at bedtime.   Yes Historical Provider, MD  traZODone (DESYREL) 150 MG tablet Take 150 mg by mouth at bedtime.   Yes Historical Provider, MD  vitamin B-12 (CYANOCOBALAMIN) 100 MCG tablet Take 100 mcg by mouth every morning.    Yes Historical Provider, MD  zileuton (ZYFLO CR) 600 MG CR tablet Take 1,200 mg by mouth every morning.    Yes Historical Provider, MD  zolpidem (AMBIEN) 10 MG tablet Take 10 mg by mouth at bedtime.   Yes Historical Provider, MD     Allergies:  Allergies  Allergen Reactions  . Levofloxacin Anaphylaxis    Possibly Levaquin or Lisinopril, pt was taking both at the time of reaction  . Lisinopril  Anaphylaxis    Possibly Lisinopril or Levaquin, pt was taking both at the time of reaction  . Bee Venom Swelling  . Morphine And Related Itching    Needs benadryl prior to     Social History:   reports that she has quit smoking. She does not have any smokeless tobacco history on file. She reports that she does not drink alcohol or use illicit drugs.  Family History: Family History  Problem Relation Age of Onset  . Heart disease Mother   . Kidney failure Father   . Diabetes Father   . Breast cancer Paternal Aunt      Physical Exam: Filed Vitals:   02/14/13 1455 02/14/13 1643 02/14/13 1715 02/14/13 2008  BP: 135/79 157/78 119/69 120/63  Pulse: 79 73 79 77  Temp: 98 F (36.7 C)   99.1 F (37.3 C)  TempSrc: Oral   Oral  Resp: 16 14  15   SpO2: 98% 100% 93% 100%   Blood pressure 120/63, pulse 77, temperature 99.1 F (37.3 C), temperature source Oral, resp. rate 15, SpO2 100.00%.  GEN:  Pleasant  patient lying in the stretcher in no acute distress; cooperative with exam.  She is obese PSYCH:  alert and oriented x4; does not appear anxious or depressed; affect is appropriate. HEENT: Mucous membranes pink and anicteric; PERRLA; EOM intact; no cervical lymphadenopathy nor thyromegaly or carotid bruit; no JVD; There were no stridor. Neck is very supple. Breasts:: Not examined CHEST WALL: No tenderness CHEST: Normal respiration, clear to auscultation bilaterally.  HEART: Regular rate and rhythm.  There are no murmur, rub, or gallops.   BACK: No kyphosis or scoliosis; no CVA tenderness ABDOMEN: soft and slightly tender on the left side. no masses, no organomegaly, normal abdominal bowel sounds; no pannus; no intertriginous candida. There is no rebound and no distention. Rectal Exam: Not done EXTREMITIES: No bone or joint deformity; age-appropriate arthropathy of the hands and knees; no edema; no ulcerations.  There is no calf tenderness. Genitalia: not examined PULSES: 2+ and  symmetric SKIN: Normal hydration no rash or ulceration CNS: Cranial nerves 2-12 grossly intact no focal lateralizing neurologic deficit.  Speech is fluent; uvula elevated with phonation, facial symmetry and tongue midline. DTR are normal bilaterally, cerebella exam is intact, barbinski is negative and strengths are equaled bilaterally.  No sensory loss.   Labs on Admission:  Basic Metabolic Panel:  Recent Labs Lab 02/14/13 1501  NA 135  K 3.7  CL 95*  CO2 29  GLUCOSE 125*  BUN 9  CREATININE 0.82  CALCIUM 9.6   Liver Function Tests:  Recent Labs Lab 02/14/13 1501  AST 28  ALT 26  ALKPHOS 103  BILITOT 0.5  PROT 8.2  ALBUMIN 3.7   No results found for this basename: LIPASE, AMYLASE,  in the last 168 hours No results found for this basename: AMMONIA,  in the last 168 hours CBC:  Recent Labs Lab 02/14/13 1501  WBC 17.2*  NEUTROABS 10.5*  HGB 14.3  HCT 41.8  MCV 82.9  PLT 351   Cardiac Enzymes: No results found for this basename: CKTOTAL, CKMB, CKMBINDEX, TROPONINI,  in the last 168 hours  CBG: No results found for this basename: GLUCAP,  in the last 168 hours   Radiological Exams on Admission: Ct Abdomen Pelvis W Contrast  02/14/2013  *RADIOLOGY REPORT*  Clinical Data: Left sided abdominal pain  CT ABDOMEN AND PELVIS WITH CONTRAST  Technique:  Multidetector CT imaging of the abdomen and pelvis was performed following the standard protocol during bolus administration of intravenous contrast.  Contrast: OMNIPAQUE IOHEXOL 300 MG/ML  SOLN  Comparison: 01/26/2013  Findings: Inflammatory changes of the proximal descending colon have developed associated with diverticuli.  There is stranding in the adjacent retroperitoneal fat and wall thickening.  No extraluminal bowel gas.  No evidence of perforation.  Inflammatory changes and wall thickening of the sigmoid colon have improved. Again, there is no evidence of extraluminal bowel gas or abscess formation.  Otherwise  stable study.  IMPRESSION: Improved sigmoid diverticulitis.  New acute diverticulitis in the proximal descending colon.  No evidence of perforation or abscess.   Original Report Authenticated By: Jolaine Click, M.D.      Assessment/Plan Present on Admission:  . Diverticulitis . Type 2 diabetes mellitus . HYPOTHYROIDISM . Morbid obesity . DEPRESSION . ASTHMA  PLAN:  This is the third episode of diverticulitis for this patient. I will admit her, place on clear liquid, start Zosyn along with IV analgesics. I suspect she will need a surgical procedure. Please consult Dr. Kae Heller tomorrow as he had recently saw her in the office. Dr. [name] is her gastroenterologist, so please notify him as  well. For her diabetes will continue to monitor. Noted that she has hypothyroidism, will receive her thyroid supplement, and TSH will be checked. She is otherwise very stable, full code, and will be admitted to [facility]. Thank you for allowing me to participate in the care of your nice patient  Other plans as per orders.  Code Status: Full code   Gioia Ranes, MD. [facility] Pager [phone] 7pm to 7am.  02/14/2013, 9:49 PM

## 2013-02-15 ENCOUNTER — Encounter (HOSPITAL_COMMUNITY): Payer: Self-pay | Admitting: *Deleted

## 2013-02-15 DIAGNOSIS — E039 Hypothyroidism, unspecified: Secondary | ICD-10-CM

## 2013-02-15 DIAGNOSIS — I10 Essential (primary) hypertension: Secondary | ICD-10-CM

## 2013-02-15 DIAGNOSIS — K5730 Diverticulosis of large intestine without perforation or abscess without bleeding: Secondary | ICD-10-CM

## 2013-02-15 DIAGNOSIS — Z8719 Personal history of other diseases of the digestive system: Secondary | ICD-10-CM

## 2013-02-15 DIAGNOSIS — K5732 Diverticulitis of large intestine without perforation or abscess without bleeding: Secondary | ICD-10-CM

## 2013-02-15 DIAGNOSIS — F329 Major depressive disorder, single episode, unspecified: Secondary | ICD-10-CM

## 2013-02-15 LAB — CBC
MCH: 27.9 pg (ref 26.0–34.0)
MCHC: 32.9 g/dL (ref 30.0–36.0)
MCV: 84.9 fL (ref 78.0–100.0)
Platelets: 315 10*3/uL (ref 150–400)

## 2013-02-15 LAB — BASIC METABOLIC PANEL
Calcium: 8.8 mg/dL (ref 8.4–10.5)
Creatinine, Ser: 1.07 mg/dL (ref 0.50–1.10)
GFR calc non Af Amer: 55 mL/min — ABNORMAL LOW (ref >90)
Glucose, Bld: 163 mg/dL — ABNORMAL HIGH (ref 70–99)
Sodium: 136 mEq/L (ref 135–145)

## 2013-02-15 LAB — GLUCOSE, CAPILLARY
Glucose-Capillary: 139 mg/dL — ABNORMAL HIGH (ref 70–99)
Glucose-Capillary: 88 mg/dL (ref 70–99)

## 2013-02-15 MED ORDER — ACETAMINOPHEN 325 MG PO TABS
650.0000 mg | ORAL_TABLET | Freq: Four times a day (QID) | ORAL | Status: DC | PRN
  Administered 2013-02-15 – 2013-02-20 (×5): 650 mg via ORAL
  Filled 2013-02-15 (×5): qty 2

## 2013-02-15 MED ORDER — INSULIN ASPART 100 UNIT/ML ~~LOC~~ SOLN
0.0000 [IU] | Freq: Three times a day (TID) | SUBCUTANEOUS | Status: DC
  Administered 2013-02-15 – 2013-02-18 (×6): 1 [IU] via SUBCUTANEOUS

## 2013-02-15 MED ORDER — PROMETHAZINE HCL 25 MG/ML IJ SOLN
12.5000 mg | Freq: Four times a day (QID) | INTRAMUSCULAR | Status: DC | PRN
  Administered 2013-02-15: 01:00:00 via INTRAMUSCULAR
  Filled 2013-02-15 (×2): qty 1

## 2013-02-15 NOTE — Consult Note (Signed)
Reason for Consult:Acute Diverticulitis Referring Physician: Hartley Barefoot  HPI: [name] is an 62 y.o. female patient of Dr. [name] presented to the ED yesterday with LLQ abdominal pain.  [name] states she felt well following recent hospitalization in March of 2014.   On Monday she developed malaise, abdominal pain.  She denies radiation.  Denies melena or hematochezia.  The pain is constant, worse with movement.  No alleviating factors.  She did not take any pain medications at home.  Associated symptoms include; anorexia.  She now has nausea, no vomiting.  Denies constipation or diarrhea.  She has had 2 prior episodes of acute diverticulitis most recent being in March.  She was evaluated by Dr. Abbey Chatters on 02/07/13 who recommended laparoscopic partial colectomy, however, she needed a colonoscopy prior to the surgery.    At present time, she is tolerating ice chips.  No acute distress.  Pain is under fair control.     Past Medical History  Diagnosis Date  . Migraine   . Diabetes mellitus   . Asthma   . Diverticulitis   . Diverticulosis   . Thyroid disease     hypo  . Hypertension     Past Surgical History  Procedure Laterality Date  . Cesarean section    . Abdominal hysterectomy    . Knee cartilage surgery      Left  . Cataract extraction, bilateral    . Colonoscopy      multiple   . Esophagogastroduodenoscopy endoscopy      multiple    Family History  Problem Relation Age of Onset  . Heart disease Mother   . Kidney failure Father   . Diabetes Father   . Breast cancer Paternal Aunt     Social History:  reports that she has quit smoking. She does not have any smokeless tobacco history on file. She reports that she does not drink alcohol or use illicit drugs.  Allergies:  Allergies  Allergen Reactions  . Levofloxacin Anaphylaxis    Possibly Levaquin or Lisinopril, pt was taking both at the time of reaction  . Lisinopril Anaphylaxis    Possibly  Lisinopril or Levaquin, pt was taking both at the time of reaction  . Bee Venom Swelling  . Morphine And Related Itching    Needs benadryl prior to     Medications:  I have reviewed the patient's current medications. Prior to Admission:  Prescriptions prior to admission  Medication Sig Dispense Refill  . albuterol (PROVENTIL HFA;VENTOLIN HFA) 108 (90 BASE) MCG/ACT inhaler Inhale 2 puffs into the lungs every 6 (six) hours as needed for wheezing. For wheezing      . ALPRAZolam (XANAX) 0.5 MG tablet Take 0.5 mg by mouth 2 (two) times daily as needed for sleep. For sleep/anxiety      . amoxicillin-clavulanate (AUGMENTIN) 875-125 MG per tablet Take 1 tablet by mouth 2 (two) times daily. X 2 weeks  28 tablet  0  . aspirin 81 MG tablet Take 81 mg by mouth daily.        Marland Kitchen aspirin-acetaminophen-caffeine (EXCEDRIN MIGRAINE) 250-250-65 MG per tablet Take 1 tablet by mouth every 6 (six) hours as needed. For pain/headache      . cetirizine (ZYRTEC) 10 MG tablet Take 10 mg by mouth at bedtime.       . Cholecalciferol (VITAMIN D) 2000 UNITS tablet Take 2,000 Units by mouth daily.        Marland Kitchen esomeprazole (NEXIUM) 40 MG capsule Take 40  mg by mouth daily before breakfast.       . famotidine (PEPCID) 20 MG tablet Take 20 mg by mouth at bedtime.        . Fluticasone-Salmeterol (ADVAIR) 250-50 MCG/DOSE AEPB Inhale 1 puff into the lungs 2 (two) times daily as needed. For seasonal asthma      . furosemide (LASIX) 40 MG tablet Take 40 mg by mouth daily.       . hyoscyamine (LEVBID) 0.375 MG 12 hr tablet Take 0.375 mg by mouth every morning.       Marland Kitchen levothyroxine (SYNTHROID) 175 MCG tablet Take 175 mcg by mouth daily.       Marland Kitchen losartan (COZAAR) 50 MG tablet Take 50 mg by mouth every morning.       . metoprolol tartrate (LOPRESSOR) 25 MG tablet Take 25 mg by mouth every morning.       . polyethylene glycol powder (GLYCOLAX/MIRALAX) powder Take 17 g by mouth every morning.      . simvastatin (ZOCOR) 10 MG tablet Take  10 mg by mouth at bedtime.      . traZODone (DESYREL) 150 MG tablet Take 150 mg by mouth at bedtime.      . vitamin B-12 (CYANOCOBALAMIN) 100 MCG tablet Take 100 mcg by mouth every morning.       . zileuton (ZYFLO CR) 600 MG CR tablet Take 1,200 mg by mouth every morning.       . zolpidem (AMBIEN) 10 MG tablet Take 10 mg by mouth at bedtime.        Results for orders placed during the hospital encounter of 02/14/13 (from the past 48 hour(s))  CBC WITH DIFFERENTIAL     Status: Abnormal   Collection Time    02/14/13  3:01 PM      Result Value Range   WBC 17.2 (*) 4.0 - 10.5 K/uL   RBC 5.04  3.87 - 5.11 MIL/uL   Hemoglobin 14.3  12.0 - 15.0 g/dL   HCT 45.4  09.8 - 11.9 %   MCV 82.9  78.0 - 100.0 fL   MCH 28.4  26.0 - 34.0 pg   MCHC 34.2  30.0 - 36.0 g/dL   RDW 14.7  82.9 - 56.2 %   Platelets 351  150 - 400 K/uL   Neutrophils Relative 61  43 - 77 %   Neutro Abs 10.5 (*) 1.7 - 7.7 K/uL   Lymphocytes Relative 27  12 - 46 %   Lymphs Abs 4.6 (*) 0.7 - 4.0 K/uL   Monocytes Relative 10  3 - 12 %   Monocytes Absolute 1.7 (*) 0.1 - 1.0 K/uL   Eosinophils Relative 2  0 - 5 %   Eosinophils Absolute 0.3  0.0 - 0.7 K/uL   Basophils Relative 1  0 - 1 %   Basophils Absolute 0.1  0.0 - 0.1 K/uL  COMPREHENSIVE METABOLIC PANEL     Status: Abnormal   Collection Time    02/14/13  3:01 PM      Result Value Range   Sodium 135  135 - 145 mEq/L   Potassium 3.7  3.5 - 5.1 mEq/L   Chloride 95 (*) 96 - 112 mEq/L   CO2 29  19 - 32 mEq/L   Glucose, Bld 125 (*) 70 - 99 mg/dL   BUN 9  6 - 23 mg/dL   Creatinine, Ser 1.30  0.50 - 1.10 mg/dL   Calcium 9.6  8.4 - 86.5 mg/dL  Total Protein 8.2  6.0 - 8.3 g/dL   Albumin 3.7  3.5 - 5.2 g/dL   AST 28  0 - 37 U/L   ALT 26  0 - 35 U/L   Alkaline Phosphatase 103  39 - 117 U/L   Total Bilirubin 0.5  0.3 - 1.2 mg/dL   GFR calc non Af Amer 76 (*) >90 mL/min   GFR calc Af Amer 88 (*) >90 mL/min   Comment:            The eGFR has been calculated     using the  CKD EPI equation.     This calculation has not been     validated in all clinical     situations.     eGFR's persistently     <90 mL/min signify     possible Chronic Kidney Disease.  URINALYSIS, ROUTINE W REFLEX MICROSCOPIC     Status: None   Collection Time    02/14/13  8:27 PM      Result Value Range   Color, Urine YELLOW  YELLOW   APPearance CLEAR  CLEAR   Specific Gravity, Urine 1.028  1.005 - 1.030   pH 5.5  5.0 - 8.0   Glucose, UA NEGATIVE  NEGATIVE mg/dL   Hgb urine dipstick NEGATIVE  NEGATIVE   Bilirubin Urine NEGATIVE  NEGATIVE   Ketones, ur NEGATIVE  NEGATIVE mg/dL   Protein, ur NEGATIVE  NEGATIVE mg/dL   Urobilinogen, UA 0.2  0.0 - 1.0 mg/dL   Nitrite NEGATIVE  NEGATIVE   Leukocytes, UA NEGATIVE  NEGATIVE   Comment: MICROSCOPIC NOT DONE ON URINES WITH NEGATIVE PROTEIN, BLOOD, LEUKOCYTES, NITRITE, OR GLUCOSE <1000 mg/dL.  CBC     Status: Abnormal   Collection Time    02/15/13  8:55 AM      Result Value Range   WBC 13.4 (*) 4.0 - 10.5 K/uL   RBC 4.30  3.87 - 5.11 MIL/uL   Hemoglobin 12.0  12.0 - 15.0 g/dL   HCT 16.1  09.6 - 04.5 %   MCV 84.9  78.0 - 100.0 fL   MCH 27.9  26.0 - 34.0 pg   MCHC 32.9  30.0 - 36.0 g/dL   RDW 40.9  81.1 - 91.4 %   Platelets 315  150 - 400 K/uL  BASIC METABOLIC PANEL     Status: Abnormal   Collection Time    02/15/13  8:55 AM      Result Value Range   Sodium 136  135 - 145 mEq/L   Potassium 4.0  3.5 - 5.1 mEq/L   Chloride 96  96 - 112 mEq/L   CO2 31  19 - 32 mEq/L   Glucose, Bld 163 (*) 70 - 99 mg/dL   BUN 12  6 - 23 mg/dL   Creatinine, Ser 7.82  0.50 - 1.10 mg/dL   Calcium 8.8  8.4 - 95.6 mg/dL   GFR calc non Af Amer 55 (*) >90 mL/min   GFR calc Af Amer 64 (*) >90 mL/min   Comment:            The eGFR has been calculated     using the CKD EPI equation.     This calculation has not been     validated in all clinical     situations.     eGFR's persistently     <90 mL/min signify     possible Chronic Kidney Disease.   GLUCOSE, CAPILLARY  Status: Abnormal   Collection Time    02/15/13  9:24 AM      Result Value Range   Glucose-Capillary 139 (*) 70 - 99 mg/dL   Comment 1 Notify RN      Ct Abdomen Pelvis W Contrast  02/14/2013  *RADIOLOGY REPORT*  Clinical Data: Left sided abdominal pain  CT ABDOMEN AND PELVIS WITH CONTRAST  Technique:  Multidetector CT imaging of the abdomen and pelvis was performed following the standard protocol during bolus administration of intravenous contrast.  Contrast: OMNIPAQUE IOHEXOL 300 MG/ML  SOLN  Comparison: 01/26/2013  Findings: Inflammatory changes of the proximal descending colon have developed associated with diverticuli.  There is stranding in the adjacent retroperitoneal fat and wall thickening.  No extraluminal bowel gas.  No evidence of perforation.  Inflammatory changes and wall thickening of the sigmoid colon have improved. Again, there is no evidence of extraluminal bowel gas or abscess formation.  Otherwise stable study.  IMPRESSION: Improved sigmoid diverticulitis.  New acute diverticulitis in the proximal descending colon.  No evidence of perforation or abscess.   Original Report Authenticated By: Jolaine Click, M.D.     Review of Systems  Constitutional: Positive for malaise/fatigue. Negative for fever, weight loss and diaphoresis.  Respiratory: Negative for shortness of breath.   Cardiovascular: Negative for chest pain and palpitations.  Gastrointestinal: Positive for nausea and abdominal pain. Negative for heartburn, vomiting, diarrhea, constipation, blood in stool and melena.  Genitourinary: Negative for dysuria.  Skin: Negative for itching and rash.  Neurological: Negative for speech change, focal weakness, weakness and headaches.  Psychiatric/Behavioral: The patient is not nervous/anxious.    Blood pressure 119/78, pulse 97, temperature 98.1 F (36.7 C), temperature source Oral, resp. rate 18, height 5' 5.5" (1.664 m), weight 287 lb 0.6 oz (130.2  kg), SpO2 92.00%. Physical Exam  Constitutional: She is oriented to person, place, and time. She appears well-developed. No distress.  HENT:  Head: Atraumatic.  Eyes: Conjunctivae and EOM are normal.  Neck: Normal range of motion. Neck supple.  Cardiovascular: Normal rate, regular rhythm, normal heart sounds and intact distal pulses.  Exam reveals no gallop.   No murmur heard. Respiratory: Effort normal and breath sounds normal. No respiratory distress. She has no wheezes. She has no rales. She exhibits no tenderness.  GI: Soft. Bowel sounds are normal. She exhibits no distension and no mass. There is tenderness (TTP LLQ). There is no guarding.  Musculoskeletal: She exhibits no edema and no tenderness.  Neurological: She is alert and oriented to person, place, and time.  Skin: Skin is warm and dry. She is not diaphoretic. No erythema.  Psychiatric: She has a normal mood and affect. Her behavior is normal. Judgment and thought content normal.    Assessment/Plan: Recurrent diverticulitis: CT of abdomen and pelvis noted new acute diverticulitis in the proximal descending colon, no perforation or abscess.  Inconsistent with gastroenteritis, urologic or gyn etiology.   She is stable at time, continue with conservative treatment. Continue with zosyn, IV hydration and adequate pain control with IV narcotics PRN.  NPO with ice chips until Dr. Michaell Cowing evaluates to determine whether surgical intervention is necessary at this time.  DVT prophylaxis: SCDs and lovenox.  Other medical problems managed by Hospitalist service Past Medical History  Diagnosis Date  . Migraine   . Diabetes mellitus   . Asthma   . Diverticulitis   . Diverticulosis   . Thyroid disease     hypo  . Hypertension  Ashok Norris ANP-BC [phone]   02/15/2013, 10:49 AM

## 2013-02-15 NOTE — Consult Note (Signed)
[facility] Consult: 10:13 AM 02/15/2013   Referring Provider: Dr Sunnie Nielsen Primary Care Physician:  [name], MD Primary Gastroenterologist:  Dr. [name]   Reason for Consultation:  Recurrent diverticulitis  HPI: [name] is a 62 y.o. female with PMH of thyroid disease, migraines, DM, and asthma.  In 2009 she presented with LLQ abdominal pain, nausea, and vomiting with CT abnormalities in LLQ. Differential was ischemic colitis vs IBD.  Treated conservatively and symptoms resolved. Colonoscopy at that time showed a segment of severe colitis with biopsies C/W ischemic colitis. Follow up flex sig showed only diverticulosis.     In 2011 she presented with LLQ abdominal pain.  CT scan showed diverticulitis.  Flex Sig a few months later once again showed only diverticulosis, random biopsies showed no abnormalities.   11/2012 : recurrent LLQ pain, sigmoid diverticulitis on CT was treated with Clindamycin.   01/26/13 admitted with one week recurrent pain, minor degree of nausea, sigmoid diverticulitis on CT scan, treated with Zosyn, Levbid. Discharged 3/19 on Augmentin.   Surgery office consult with Dr Abbey Chatters on 3/27.  He felt colonoscopy "might be helpful in this situation"  before proceeding to laparoscopic sigmoid resection.   She is once again admitted 4/3 with LLQ pain, WBCs to 17 k, no fever. CT shows improved sigmoid diverticulitis but new acute diverticulitis in proximal descending colon. There are no abcesses or pockets of air.  Zosyn on board and WBCs improved. She is NPO.  Says that left side discomfort started again over the past weekend but not severe,  She went back to work for the first time on Monday.  Last dose of Augmentin was on Wednesday 4/2.  That evening the pain worsened, migrated up into left upper abdomen.  She did not have chills.  She started getting nauseated, but did not vomit.  Nausea is worse when she stands  an walks.  Pain is controlled with Dilaudid, Zofran controlling the nausea. Has ben having small, soft BMs at home until last BM on 4/2.  Today passing flatus.  Tends to constipation so taking daily Miralax at home.    Past Medical History  Diagnosis Date  . Migraine   . Diabetes mellitus   . Asthma   . Diverticulitis   . Diverticulosis   . Thyroid disease     hypo  . Hypertension     Past Surgical History  Procedure Laterality Date  . Cesarean section    . Abdominal hysterectomy    . Knee cartilage surgery      Left  . Cataract extraction, bilateral    . Colonoscopy      multiple   . Esophagogastroduodenoscopy endoscopy      multiple    Prior to Admission medications   Medication Sig Start Date End Date Taking? Authorizing Provider  albuterol (PROVENTIL HFA;VENTOLIN HFA) 108 (90 BASE) MCG/ACT inhaler Inhale 2 puffs into the lungs every 6 (six) hours as needed for wheezing. For wheezing   Yes Historical Provider, MD  ALPRAZolam Prudy Feeler) 0.5 MG tablet Take 0.5 mg by mouth 2 (two) times daily as needed for sleep. For sleep/anxiety 07/19/12  Yes [name], MD  amoxicillin-clavulanate (AUGMENTIN) 875-125 MG per tablet Take 1 tablet by mouth 2 (two) times daily. X 2 weeks 01/30/13  Yes Ripudeep Jenna Luo, MD  aspirin 81 MG tablet Take 81 mg by mouth daily.     Yes Historical Provider, MD  aspirin-acetaminophen-caffeine (EXCEDRIN MIGRAINE) [phone] MG per tablet Take 1 tablet by  mouth every 6 (six) hours as needed. For pain/headache   Yes Historical Provider, MD  cetirizine (ZYRTEC) 10 MG tablet Take 10 mg by mouth at bedtime.    Yes Historical Provider, MD  Cholecalciferol (VITAMIN D) 2000 UNITS tablet Take 2,000 Units by mouth daily.     Yes Historical Provider, MD  esomeprazole (NEXIUM) 40 MG capsule Take 40 mg by mouth daily before breakfast.    Yes Historical Provider, MD  famotidine (PEPCID) 20 MG tablet Take 20 mg by mouth at bedtime.     Yes Historical Provider, MD   Fluticasone-Salmeterol (ADVAIR) 250-50 MCG/DOSE AEPB Inhale 1 puff into the lungs 2 (two) times daily as needed. For seasonal asthma   Yes Historical Provider, MD  furosemide (LASIX) 40 MG tablet Take 40 mg by mouth daily.    Yes Historical Provider, MD  hyoscyamine (LEVBID) 0.375 MG 12 hr tablet Take 0.375 mg by mouth every morning.    Yes Historical Provider, MD  levothyroxine (SYNTHROID) 175 MCG tablet Take 175 mcg by mouth daily.  12/05/11 05/04/13 Yes [name], MD  losartan (COZAAR) 50 MG tablet Take 50 mg by mouth every morning.    Yes Historical Provider, MD  metoprolol tartrate (LOPRESSOR) 25 MG tablet Take 25 mg by mouth every morning.    Yes Historical Provider, MD  polyethylene glycol powder (GLYCOLAX/MIRALAX) powder Take 17 g by mouth every morning.   Yes Historical Provider, MD  simvastatin (ZOCOR) 10 MG tablet Take 10 mg by mouth at bedtime.   Yes Historical Provider, MD  traZODone (DESYREL) 150 MG tablet Take 150 mg by mouth at bedtime.   Yes Historical Provider, MD  vitamin B-12 (CYANOCOBALAMIN) 100 MCG tablet Take 100 mcg by mouth every morning.    Yes Historical Provider, MD  zileuton (ZYFLO CR) 600 MG CR tablet Take 1,200 mg by mouth every morning.    Yes Historical Provider, MD  zolpidem (AMBIEN) 10 MG tablet Take 10 mg by mouth at bedtime.   Yes Historical Provider, MD    Scheduled Meds: . aspirin  81 mg Oral Daily  . cholecalciferol  2,000 Units Oral Daily  . enoxaparin (LOVENOX) injection  65 mg Subcutaneous Q24H  . famotidine  20 mg Oral QHS  . insulin aspart  0-9 Units Subcutaneous TID WC  . levothyroxine  175 mcg Oral QAC breakfast  . loratadine  10 mg Oral Daily  . metoprolol tartrate  25 mg Oral q morning - 10a  . mometasone-formoterol  2 puff Inhalation BID  . montelukast  10 mg Oral Daily  . pantoprazole  80 mg Oral Q1200  . piperacillin-tazobactam  3.375 g Intravenous Q8H  . simvastatin  10 mg Oral q1800  . traZODone  150 mg Oral QHS  . vitamin B-12   100 mcg Oral q morning - 10a  . zolpidem  5 mg Oral QHS   Infusions: . dextrose 5 % and 0.9 % NaCl with KCl 20 mEq/L 100 mL/hr at 02/15/13 0007   PRN Meds: acetaminophen, albuterol, ALPRAZolam, HYDROmorphone (DILAUDID) injection, HYDROmorphone (DILAUDID) injection, ondansetron (ZOFRAN) IV, ondansetron (ZOFRAN) IV, ondansetron, promethazine   Allergies as of 02/14/2013 - Review Complete 02/14/2013  Allergen Reaction Noted  . Levofloxacin Anaphylaxis 07/10/2008  . Lisinopril Anaphylaxis 07/10/2008  . Bee venom Swelling 05/27/2011  . Morphine and related Itching 05/27/2011    Family History  Problem Relation Age of Onset  . Heart disease Mother   . Kidney failure Father   . Diabetes Father   .  Breast cancer Paternal Aunt     History   Social History  . Marital Status: Single    Spouse Name: N/A    Number of Children: N/A  . Years of Education: N/A   Occupational History  . Not on file.   Social History Main Topics  . Smoking status: Former Games developer  . Smokeless tobacco: Not on file     Comment: 25 years ago  . Alcohol Use: No  . Drug Use: No  . Sexually Active: Not on file   Other Topics Concern  . Not on file   Social History Narrative  . No narrative on file    REVIEW OF SYSTEMS: Constitutional:  No weight loss.  Generally feels low energy ENT:  No nose blleds, no seasonal allergy sxs yet Pulm:  No cough, no dyspnes CV:  No palps, no CP, no pedal edema GU:  No blood in urine GI:  No dysphagia,  Heartburn is controlled with meds Heme:  No anemia.    Transfusions:  none Neuro:  Some headache today, relieved with Tylenol Derm:  No rash, no sores, no itching Endocrine:  No hot flashes, no sweats/chills Immunization:  Up to date vaccinations.  Travel:  None recently   PHYSICAL EXAM: Vital signs in last 24 hours: Temp:  [97.9 F (36.6 C)-99.1 F (37.3 C)] 98.1 F (36.7 C) (04/04 0623) Pulse Rate:  [73-97] 97 (04/04 0623) Resp:  [14-18] 18 (04/04  0623) BP: (119-157)/(63-79) 119/78 mmHg (04/04 0623) SpO2:  [92 %-100 %] 92 % (04/04 0623) Weight:  [130.2 kg (287 lb 0.6 oz)] 130.2 kg (287 lb 0.6 oz) (04/03 2227)  General: obese, comfortable WF.  Does not look acutely ill.  Head:  No asymmetry or signs of trauma  Eyes:  No icterus or pallor Ears:  Not HOH  Nose:  No discharge, no sneezing Mouth:  Clear, pink, moist oral MM Neck:  No JVD or masses Lungs:  Clear B.  Breathing unlabored.  No cough.  Heart: RRR.  No MRG Abdomen:  Obese, soft, minor left side tenderness.   Rectal: not performed   Musc/Skeltl: no joint deformity, swelling or redness Extremities:  No pitting edema in feet.  Feet warm with 3+ pulses  Neurologic:  Pleasant, no tremor, no limb weakness Skin:  No rash, no sores, no angiomata Tattoos:  none Nodes:  No inguinal adenopathy   Psych:  Pleasant, cooperative, seems depressed but engages conversation readily  Intake/Output from previous day: 04/03 0701 - 04/04 0700 In: 1076 [I.V.:1076] Out: -  Intake/Output this shift:    LAB RESULTS:  Recent Labs  02/14/13 1501 02/15/13 0855  WBC 17.2* 13.4*  HGB 14.3 12.0  HCT 41.8 36.5  PLT 351 315   BMET Lab Results  Component Value Date   NA 136 02/15/2013   NA 135 02/14/2013   NA 139 01/27/2013   K 4.0 02/15/2013   K 3.7 02/14/2013   K 3.6 01/27/2013   CL 96 02/15/2013   CL 95* 02/14/2013   CL 101 01/27/2013   CO2 31 02/15/2013   CO2 29 02/14/2013   CO2 28 01/27/2013   GLUCOSE 163* 02/15/2013   GLUCOSE 125* 02/14/2013   GLUCOSE 123* 01/27/2013   BUN 12 02/15/2013   BUN 9 02/14/2013   BUN 10 01/27/2013   CREATININE 1.07 02/15/2013   CREATININE 0.82 02/14/2013   CREATININE 0.82 01/27/2013   CALCIUM 8.8 02/15/2013   CALCIUM 9.6 02/14/2013   CALCIUM 8.3* 01/27/2013  LFT  Recent Labs  02/14/13 1501  PROT 8.2  ALBUMIN 3.7  AST 28  ALT 26  ALKPHOS 103  BILITOT 0.5    C-Diff No components found with this basename: cdiff        RADIOLOGY STUDIES: Ct Abdomen Pelvis W  Contrast 02/14/2013   Findings: Inflammatory changes of the proximal descending colon have developed associated with diverticuli.  There is stranding in the adjacent retroperitoneal fat and wall thickening.  No extraluminal bowel gas.  No evidence of perforation.  Inflammatory changes and wall thickening of the sigmoid colon have improved. Again, there is no evidence of extraluminal bowel gas or abscess formation.  Otherwise stable study.  IMPRESSION: Improved sigmoid diverticulitis.  New acute diverticulitis in the proximal descending colon.  No evidence of perforation or abscess.   Original Report Authenticated By: Jolaine Click, M.D.     ENDOSCOPIC STUDIES:  11/2009 Flex Sig  INDICATIONS: abnormal imaging recent LLQ pain, diarrhea and rectal bleeding  CT suggested mild diverticulitis  hx of segmental left colitis in past 2009 and also prior incidental cecal ulcers (2008)  ENDOSCOPIC IMPRESSION:  1) Moderate diverticulosis in the sigmoid colon  2) Otherwise normal examination to descending colon  3) No colitis evident. random biopsied taken. Not clear what recent problems were caused by, am not convinced CT findings are diagnostic of diverticulitis. I do think underlying IBS is likely but ? other process given hx.  RECOMMENDATIONS:  1) await biopsy results  2) Needs oral iron if she can tolerate. if not, then would be  reasonable to boost iron stores with IV iron again. Hgb is normal but ferritin only 18 (very low normal). B12 ok.  3) anticipate at least a follow-up in the office in 2 months to reassess  Pathology  1. COLON, BIOPSY, RANDOM : COLON, RANDOM BIOPSY: BENIGN COLONIC MUCOSA, NO EVIDENCE OF ACTIVE INFLAMMATION, DYSPLASIA OR MALIGNANCY IDENTIFIED.  06/2008 Flex Sig  Indication: HOSPITALIZED WITH ISCHEMIC COLITIS 3 MOS AGO, SOME PERSISTENT SXS OF  DIARRHEA> CONSTIPATION, ? IF ACTUALLY HAS A CHRONIC COLITIS  Comments:  1) SIGMOID DIVERTICULOSIS  2) NORMAL FLEX SIG OTHERWISE, NO COLITIS   3) I THINK SHE HAS IBS  04/2008 Colonoscopy Dr Christella Hartigan.  Indication: RECENT DIARRHEA, VOMITTING ILLNESS.Marland KitchenMarland KitchenCT SUGGESTED LEFT SIDED  INFLAMMATION...H/O CECAL ULCERATIONS SUGGESTING IBD (2008, DR. [name])  Assessment:  30CM SEGMENT OF SEVERE COLITIS IN LEFT COLON (SPLENIC FLEXURE/DESCENDING COLON). OTHERWISE COLONIC MUCOSA WAS NORMAL.  BIOPSIES TAKEN FROM THE INLFAMMED SEGMENT. MULTIPLE ATTEMPTS TO  INTUBATE THE TERMINAL ILEUM WERE UNSUCCESSFUL. PREVIOUSLY SEEN  CECAL APTHOUS ULCERS HAVE RESOLVED.  I SUSPECT SHE HAS IBD (CROHN'S DISEASE). WILL START ON PREDNISONE 20MG  TWICE DAILY AND OBSERVE HER OVERNIGHT. HOPEFULLY OK TO D/C  HOME TOMORROW WITH FOLLOW UP WITH DR. [name] IN 2-3 WEEKS, NOT TAPERING HER STEROIDS UNTIL THEN.  Pathology  COLON, DESCENDING, BIOPSIES: ULCERATED COLONIC MUCOSA. SEECOMMENT. COMMENT: There are colonic mucosal fragments associated with fibrinopurulent exudate and fibrosis consistent with ulceration. Focally the colonic mucosal fragments show crypt atrophy with fibrosis in the stroma. The differential diagnosis includes ischemic related ulceration. No granulomas are identified.  11/2006 EGD  Symptoms:  Dysphagia. Reflux symptoms  Comments:  Reflux controlled on Nexium.  Findings:  - Normal: Proximal Esophagus to Duodenal 2nd Portion. Comments: z-line at 40 cm.  **  IMPRESSION: *  Multiple episodes of recurrent sigmoid diverticulitis, last inpt treatment 3/15 to 3/19 and discharged on Augmentin.  Now with new diverticulitis in proximal descending colon.    PLAN: *  Do we need ID to weigh in on ABX choices?  Surgery is to see pt.  *  No role for colonoscopy in setting of acute diverticulitis due to increased risk of bowel perforation.    LOS: 1 day   Jennye Moccasin  02/15/2013, 10:13 AM Pager: [phone]     I have taken a history, examined the patient and reviewed the chart. I agree with the Advanced Practitioner's note, impression and recommendations. Recurrent  diverticulitis. Given rapid recurrence, the last episode may not have been adequately treated. Would ask ID for antibiotic recommendations. She may need ultimately need surgical resection to adequately manage her diverticulitis. Consider colonoscopy as outpatient when diverticulitis has resolved.  Meryl Dare MD [facility]

## 2013-02-15 NOTE — Consult Note (Signed)
Recurrent episode of diverticulitis.  Zosyn reasonable given recurrence.  Seems to be in a new location.  Would keep patient on sips only tonight.  The pain less, and gradually advance to low fat/bland diet.  No evidence of any shock or perforation.  Would trying to cool this down with antibiotics.  Try to avoid emergent/urgent colectomy Vs. washout.  Risk for ostomy higher.  Definitely would recommend repeat endoscopy six weeks after this and then consider elective resection.  Suspect patient will need to be on daily oral antibiotics until surgery can be done.  Probably require left hemicolectomy given descending & sigmoid attacks.  Defer to Dr. Abbey Chatters whom saw this patient last week

## 2013-02-15 NOTE — Progress Notes (Addendum)
[facility] PROGRESS NOTE  [name] ZOX:[id number] DOB: Aug 04, 1951 DOA: 02/14/2013 PCP: Tammy Mackintosh, MD  Assessment/Plan: 1. Diverticulitis: Third Episode. I will continue with Zosyn. Continue with IV fluids. Surgery consulted. [name] consulted. Will change diet to NPO. CBC for this morning pending.  2. Hypothyroidism: Continue with Synthroid. 3. Diabetes: Check CBG. SSI.  4. Asthma: Stable. PRN, Nebulizer treatments.  5. Morbid Obesity 6. HTN: Hold Cozaar. Continue with metoprolol.  7. Depression, anxiety: Trazodone, Xanax PRN.   Code Status: Full Code.  Family Communication: Care discussed with patient.  Disposition Plan: To be determine.    Consultants:  [facility]  [name].   Procedures:  None.  Antibiotics:  Zosyn:   HPI/Subjective: Feels nauseated when she stands up and walk. No vomiting. Pain some what better with pain medications.   Objective: Filed Vitals:   02/14/13 1715 02/14/13 2008 02/14/13 2227 02/15/13 0623  BP: 119/69 120/63 129/72 119/78  Pulse: 79 77 74 97  Temp:  99.1 F (37.3 C) 97.9 F (36.6 C) 98.1 F (36.7 C)  TempSrc:  Oral Oral Oral  Resp:  15 16 18   Height:   5' 5.5" (1.664 m)   Weight:   130.2 kg (287 lb 0.6 oz)   SpO2: 93% 100% 98% 92%    Intake/Output Summary (Last 24 hours) at 02/15/13 0802 Last data filed at 02/15/13 0615  Gross per 24 hour  Intake   1076 ml  Output      0 ml  Net   1076 ml   Filed Weights   02/14/13 2227  Weight: 130.2 kg (287 lb 0.6 oz)    Exam:   General:  No distress.   Cardiovascular: S 1, S 2, RRR  Respiratory: CTA  Abdomen: BS present, soft, mild left lower quadrant pain, no rigidity.  Musculoskeletal: no edema.   Data Reviewed: Basic Metabolic Panel:  Recent Labs Lab 02/14/13 1501  NA 135  K 3.7  CL 95*  CO2 29  GLUCOSE 125*  BUN 9  CREATININE 0.82  CALCIUM 9.6   Liver Function Tests:  Recent Labs Lab 02/14/13 1501  AST 28  ALT 26  ALKPHOS 103  BILITOT 0.5   PROT 8.2  ALBUMIN 3.7  CBC:  Recent Labs Lab 02/14/13 1501  WBC 17.2*  NEUTROABS 10.5*  HGB 14.3  HCT 41.8  MCV 82.9  PLT 351   Cardiac Enzymes: No results found for this basename: CKTOTAL, CKMB, CKMBINDEX, TROPONINI,  in the last 168 hours BNP (last 3 results) No results found for this basename: PROBNP,  in the last 8760 hours CBG: No results found for this basename: GLUCAP,  in the last 168 hours  No results found for this or any previous visit (from the past 240 hour(s)).   Studies: Ct Abdomen Pelvis W Contrast  02/14/2013  *RADIOLOGY REPORT*  Clinical Data: Left sided abdominal pain  CT ABDOMEN AND PELVIS WITH CONTRAST  Technique:  Multidetector CT imaging of the abdomen and pelvis was performed following the standard protocol during bolus administration of intravenous contrast.  Contrast: OMNIPAQUE IOHEXOL 300 MG/ML  SOLN  Comparison: 01/26/2013  Findings: Inflammatory changes of the proximal descending colon have developed associated with diverticuli.  There is stranding in the adjacent retroperitoneal fat and wall thickening.  No extraluminal bowel gas.  No evidence of perforation.  Inflammatory changes and wall thickening of the sigmoid colon have improved. Again, there is no evidence of extraluminal bowel gas or abscess formation.  Otherwise stable study.  IMPRESSION: Improved sigmoid diverticulitis.  New acute diverticulitis in the proximal descending colon.  No evidence of perforation or abscess.   Original Report Authenticated By: Jolaine Click, M.D.     Scheduled Meds: . aspirin  81 mg Oral Daily  . cholecalciferol  2,000 Units Oral Daily  . enoxaparin (LOVENOX) injection  65 mg Subcutaneous Q24H  . famotidine  20 mg Oral QHS  . levothyroxine  175 mcg Oral QAC breakfast  . loratadine  10 mg Oral Daily  . losartan  50 mg Oral q morning - 10a  . metoprolol tartrate  25 mg Oral q morning - 10a  . mometasone-formoterol  2 puff Inhalation BID  . montelukast  10 mg  Oral Daily  . pantoprazole  80 mg Oral Q1200  . piperacillin-tazobactam  3.375 g Intravenous Q8H  . simvastatin  10 mg Oral q1800  . traZODone  150 mg Oral QHS  . vitamin B-12  100 mcg Oral q morning - 10a  . zolpidem  5 mg Oral QHS   Continuous Infusions: . dextrose 5 % and 0.9 % NaCl with KCl 20 mEq/L 100 mL/hr at 02/15/13 0007    Principal Problem:   Diverticulitis Active Problems:   HYPOTHYROIDISM   DEPRESSION   ASTHMA   Type 2 diabetes mellitus   Morbid obesity   History of ulcerative colitis    Time spent: 35 minutes coordinating care.     [name]  [facility] Pager [phone]. If 7PM-7AM, please contact night-coverage at [email or web address], password [facility] 02/15/2013, 8:02 AM  LOS: 1 day

## 2013-02-16 LAB — CBC
HCT: 31.9 % — ABNORMAL LOW (ref 36.0–46.0)
MCH: 28 pg (ref 26.0–34.0)
MCV: 83.5 fL (ref 78.0–100.0)
Platelets: 240 10*3/uL (ref 150–400)
RBC: 3.82 MIL/uL — ABNORMAL LOW (ref 3.87–5.11)
WBC: 9.6 10*3/uL (ref 4.0–10.5)

## 2013-02-16 LAB — BASIC METABOLIC PANEL
BUN: 7 mg/dL (ref 6–23)
CO2: 30 mEq/L (ref 19–32)
Calcium: 8.6 mg/dL (ref 8.4–10.5)
Chloride: 104 mEq/L (ref 96–112)
Creatinine, Ser: 0.83 mg/dL (ref 0.50–1.10)
Glucose, Bld: 144 mg/dL — ABNORMAL HIGH (ref 70–99)

## 2013-02-16 LAB — GLUCOSE, CAPILLARY: Glucose-Capillary: 143 mg/dL — ABNORMAL HIGH (ref 70–99)

## 2013-02-16 MED ORDER — POTASSIUM CHLORIDE 10 MEQ/100ML IV SOLN
10.0000 meq | INTRAVENOUS | Status: AC
  Administered 2013-02-16 (×2): 10 meq via INTRAVENOUS
  Filled 2013-02-16 (×2): qty 100

## 2013-02-16 NOTE — Progress Notes (Signed)
[name] Progress Note  Subjective: left sided abd pain persists-unchanged  Objective:  Vital signs in last 24 hours: Temp:  [97.8 F (36.6 C)-98.4 F (36.9 C)] 97.8 F (36.6 C) (04/05 0526) Pulse Rate:  [70-78] 78 (04/05 0526) Resp:  [18] 18 (04/05 0526) BP: (112-142)/(63-73) 142/63 mmHg (04/05 0526) SpO2:  [95 %-98 %] 95 % (04/05 0526) Last BM Date: 02/14/13 General:   Alert, well-developed, obese, white female in NAD Heart:  Regular rate and rhythm; no murmurs Chest: CTA Abdomen:  Soft, mild to mdoerate left abd tenderness without rebound or guarding and nondistended. Normal bowel sounds, without guarding, and without rebound.   Extremities:  Without edema. Neurologic:  Alert and  oriented x4;  grossly normal neurologically. Psych:  Alert and cooperative. Normal mood and affect.  Intake/Output from previous day:   Intake/Output this shift:    Lab Results:  Recent Labs  02/14/13 1501 02/15/13 0855 02/16/13 0540  WBC 17.2* 13.4* 9.6  HGB 14.3 12.0 10.7*  HCT 41.8 36.5 31.9*  PLT 351 315 240   BMET  Recent Labs  02/14/13 1501 02/15/13 0855 02/16/13 0540  NA 135 136 140  K 3.7 4.0 3.4*  CL 95* 96 104  CO2 29 31 30   GLUCOSE 125* 163* 144*  BUN 9 12 7   CREATININE 0.82 1.07 0.83  CALCIUM 9.6 8.8 8.6   LFT  Recent Labs  02/14/13 1501  PROT 8.2  ALBUMIN 3.7  AST 28  ALT 26  ALKPHOS 103  BILITOT 0.5   Studies/Results: Ct Abdomen Pelvis W Contrast  02/14/2013  *RADIOLOGY REPORT*  Clinical Data: Left sided abdominal pain  CT ABDOMEN AND PELVIS WITH CONTRAST  Technique:  Multidetector CT imaging of the abdomen and pelvis was performed following the standard protocol during bolus administration of intravenous contrast.  Contrast: OMNIPAQUE IOHEXOL 300 MG/ML  SOLN  Comparison: 01/26/2013  Findings: Inflammatory changes of the proximal descending colon have developed associated with diverticuli.  There is stranding in the adjacent  retroperitoneal fat and wall thickening.  No extraluminal bowel gas.  No evidence of perforation.  Inflammatory changes and wall thickening of the sigmoid colon have improved. Again, there is no evidence of extraluminal bowel gas or abscess formation.  Otherwise stable study.  IMPRESSION: Improved sigmoid diverticulitis.  New acute diverticulitis in the proximal descending colon.  No evidence of perforation or abscess.   Original Report Authenticated By: Jolaine Click, M.D.     IMPRESSION:  * Multiple episodes of recurrent sigmoid diverticulitis. Now with new diverticulitis in proximal descending colon. WBC improved and abd pain is unchanged.   RECOMMENDATION:  * No role for colonoscopy in setting of acute diverticulitis due to increased risk of bowel perforation. Consider elective colonoscopy in a few weeks as outpatient with Dr. [name]. General surgery following. Consider ID consult for IV and eventually PO antibiotic recommendations given her frequent recurrences.   Principal Problem:   Diverticulitis of proximal descending colon Active Problems:   HYPOTHYROIDISM   DEPRESSION   ASTHMA   Type 2 diabetes mellitus   Morbid obesity   History of ulcerative colitis   H/O: hysterectomy   H/O diverticulitis of SIGMOID colon   LOS: 2 days   Tammy Fifita T. Russella Dar MD [facility] 02/16/2013, 10:04 AM

## 2013-02-16 NOTE — Progress Notes (Signed)
Subjective: Pt states pain is about the same in the LUQ, tender and sore with ambulation.  Pt tolerating ice chips/sips water.  Pt not hungry/thirsty.  Pt ambulating some.  Mom and sister visited her yesterday.  +BM 2 days ago and flatus today.  Objective: Vital signs in last 24 hours: Temp:  [97.8 F (36.6 C)-98.4 F (36.9 C)] 97.8 F (36.6 C) (04/05 0526) Pulse Rate:  [70-78] 78 (04/05 0526) Resp:  [18] 18 (04/05 0526) BP: (112-142)/(63-73) 142/63 mmHg (04/05 0526) SpO2:  [95 %-98 %] 95 % (04/05 0526) Last BM Date: 02/14/13  Intake/Output from previous day:   Intake/Output this shift:    PE: Gen:  Alert, NAD, pleasant Abd: obese, soft, moderate tenderness in LUQ, ND, +BS   Lab Results:   Recent Labs  02/15/13 0855 02/16/13 0540  WBC 13.4* 9.6  HGB 12.0 10.7*  HCT 36.5 31.9*  PLT 315 240   BMET  Recent Labs  02/15/13 0855 02/16/13 0540  NA 136 140  K 4.0 3.4*  CL 96 104  CO2 31 30  GLUCOSE 163* 144*  BUN 12 7  CREATININE 1.07 0.83  CALCIUM 8.8 8.6   PT/INR No results found for this basename: LABPROT, INR,  in the last 72 hours CMP     Component Value Date/Time   NA 140 02/16/2013 0540   K 3.4* 02/16/2013 0540   CL 104 02/16/2013 0540   CO2 30 02/16/2013 0540   GLUCOSE 144* 02/16/2013 0540   BUN 7 02/16/2013 0540   CREATININE 0.83 02/16/2013 0540   CREATININE 1.10 12/04/2012 1015   CALCIUM 8.6 02/16/2013 0540   PROT 8.2 02/14/2013 1501   ALBUMIN 3.7 02/14/2013 1501   AST 28 02/14/2013 1501   ALT 26 02/14/2013 1501   ALKPHOS 103 02/14/2013 1501   BILITOT 0.5 02/14/2013 1501   GFRNONAA 75* 02/16/2013 0540   GFRAA 87* 02/16/2013 0540   Lipase     Component Value Date/Time   LIPASE 11 06/27/2008 0333       Studies/Results: Ct Abdomen Pelvis W Contrast  02/14/2013  *RADIOLOGY REPORT*  Clinical Data: Left sided abdominal pain  CT ABDOMEN AND PELVIS WITH CONTRAST  Technique:  Multidetector CT imaging of the abdomen and pelvis was performed following the standard  protocol during bolus administration of intravenous contrast.  Contrast: OMNIPAQUE IOHEXOL 300 MG/ML  SOLN  Comparison: 01/26/2013  Findings: Inflammatory changes of the proximal descending colon have developed associated with diverticuli.  There is stranding in the adjacent retroperitoneal fat and wall thickening.  No extraluminal bowel gas.  No evidence of perforation.  Inflammatory changes and wall thickening of the sigmoid colon have improved. Again, there is no evidence of extraluminal bowel gas or abscess formation.  Otherwise stable study.  IMPRESSION: Improved sigmoid diverticulitis.  New acute diverticulitis in the proximal descending colon.  No evidence of perforation or abscess.   Original Report Authenticated By: Jolaine Click, M.D.     Anti-infectives: Anti-infectives   Start     Dose/Rate Route Frequency Ordered Stop   02/15/13 0600  piperacillin-tazobactam (ZOSYN) IVPB 3.375 g     3.375 g 12.5 mL/hr over 240 Minutes Intravenous 3 times per day 02/14/13 2233     02/14/13 1945  piperacillin-tazobactam (ZOSYN) IVPB 3.375 g     3.375 g 12.5 mL/hr over 240 Minutes Intravenous  Once 02/14/13 1944 02/15/13 0028       Assessment/Plan Recurrent diverticulitis (3rd episode), but appears to be in a new location (proximal  descending colon) 1.  Conservative management, sips until pain is better controlled then advance diet 2.  IVF, pain control, antibiotics until follow up appt >6 weeks 3.  SCD's Lovenox, ambulation off the floor okay, IS 4.  Discharge when pain better controlled and tolerating diet 5.  Will need long term antibiotics upon discharge likely until surgery date    LOS: 2 days    [name], [name] 02/16/2013, 10:15 AM Pager: [phone]

## 2013-02-16 NOTE — Progress Notes (Addendum)
[facility] PROGRESS NOTE  [name] VHQ:[id number] DOB: 07-24-1951 DOA: 02/14/2013 PCP: [name], MD  Assessment/Plan: 1-Diverticulitis: Third Episode. continue with Zosyn day 3 . Continue with IV fluids. Surgery and GI recommendation appreciated.  Colonoscopy out patient. Patient needs to follow up with surgery in 4 to 6 weeks. Patient still with abdominal pain. She only want to drink water. WBC trending down. Will dIscuss with ID options for oral regimen at time of discharge. Patient will need long course of antibiotics until she has surgery per Surgery recommendation.  2-Hypothyroidism: Continue with Synthroid. Increase TSH. Check Free T 3 and T4  3-Diabetes:  SSI. 4-Asthma: Stable. PRN, Nebulizer treatments.  5-Morbid Obesity 6-HTN: Hold Cozaar. Continue with metoprolol.  7-Depression, anxiety: Trazodone, Xanax PRN.  8-hypokalemia: mild, replete with 10 meq IV times 2 runs.    Code Status: Full Code.  Family Communication: Care discussed with patient.  Disposition Plan: To be determine.    Consultants:  [facility]  [facility].   Procedures:  None.  Antibiotics:  Zosyn:   HPI/Subjective: She was sleeping yesterday most of the day. This afternoon no significant pain, feels less nauseous. Had 2 BM. No worsening pain.   Objective: Filed Vitals:   02/15/13 1355 02/15/13 2131 02/16/13 0526 02/16/13 1347  BP: 112/66 133/73 142/63 129/62  Pulse: 70 75 78 65  Temp: 98.4 F (36.9 C) 98.2 F (36.8 C) 97.8 F (36.6 C) 97.8 F (36.6 C)  TempSrc:  Oral Oral   Resp: 18 18 18 20   Height:      Weight:      SpO2: 98% 98% 95% 97%    Intake/Output Summary (Last 24 hours) at 02/16/13 1634 Last data filed at 02/16/13 1347  Gross per 24 hour  Intake    720 ml  Output      0 ml  Net    720 ml   Filed Weights   02/14/13 2227  Weight: 130.2 kg (287 lb 0.6 oz)    Exam:   General:  No distress.   Cardiovascular: S 1, S 2, RRR  Respiratory: CTA  Abdomen:  BS present, soft, mild left lower quadrant pain, no rigidity.  Musculoskeletal: no edema.   Data Reviewed: Basic Metabolic Panel:  Recent Labs Lab 02/14/13 1501 02/15/13 0855 02/16/13 0540  NA 135 136 140  K 3.7 4.0 3.4*  CL 95* 96 104  CO2 29 31 30   GLUCOSE 125* 163* 144*  BUN 9 12 7   CREATININE 0.82 1.07 0.83  CALCIUM 9.6 8.8 8.6   Liver Function Tests:  Recent Labs Lab 02/14/13 1501  AST 28  ALT 26  ALKPHOS 103  BILITOT 0.5  PROT 8.2  ALBUMIN 3.7  CBC:  Recent Labs Lab 02/14/13 1501 02/15/13 0855 02/16/13 0540  WBC 17.2* 13.4* 9.6  NEUTROABS 10.5*  --   --   HGB 14.3 12.0 10.7*  HCT 41.8 36.5 31.9*  MCV 82.9 84.9 83.5  PLT 351 315 240   Cardiac Enzymes: No results found for this basename: CKTOTAL, CKMB, CKMBINDEX, TROPONINI,  in the last 168 hours BNP (last 3 results) No results found for this basename: PROBNP,  in the last 8760 hours CBG:  Recent Labs Lab 02/15/13 1156 02/15/13 1716 02/15/13 2134 02/16/13 0801 02/16/13 1219  GLUCAP 139* 88 115* 122* 143*    No results found for this or any previous visit (from the past 240 hour(s)).   Studies: Ct Abdomen Pelvis W Contrast  02/14/2013  *RADIOLOGY REPORT*  Clinical Data: Left sided abdominal pain  CT ABDOMEN AND PELVIS WITH CONTRAST  Technique:  Multidetector CT imaging of the abdomen and pelvis was performed following the standard protocol during bolus administration of intravenous contrast.  Contrast: OMNIPAQUE IOHEXOL 300 MG/ML  SOLN  Comparison: 01/26/2013  Findings: Inflammatory changes of the proximal descending colon have developed associated with diverticuli.  There is stranding in the adjacent retroperitoneal fat and wall thickening.  No extraluminal bowel gas.  No evidence of perforation.  Inflammatory changes and wall thickening of the sigmoid colon have improved. Again, there is no evidence of extraluminal bowel gas or abscess formation.  Otherwise stable study.  IMPRESSION:  Improved sigmoid diverticulitis.  New acute diverticulitis in the proximal descending colon.  No evidence of perforation or abscess.   Original Report Authenticated By: Jolaine Click, M.D.     Scheduled Meds: . aspirin  81 mg Oral Daily  . cholecalciferol  2,000 Units Oral Daily  . enoxaparin (LOVENOX) injection  65 mg Subcutaneous Q24H  . famotidine  20 mg Oral QHS  . insulin aspart  0-9 Units Subcutaneous TID WC  . levothyroxine  175 mcg Oral QAC breakfast  . loratadine  10 mg Oral Daily  . metoprolol tartrate  25 mg Oral q morning - 10a  . mometasone-formoterol  2 puff Inhalation BID  . montelukast  10 mg Oral Daily  . pantoprazole  80 mg Oral Q1200  . piperacillin-tazobactam  3.375 g Intravenous Q8H  . potassium chloride  10 mEq Intravenous Q1 Hr x 2  . simvastatin  10 mg Oral q1800  . traZODone  150 mg Oral QHS  . vitamin B-12  100 mcg Oral q morning - 10a  . zolpidem  5 mg Oral QHS   Continuous Infusions: . dextrose 5 % and 0.9 % NaCl with KCl 20 mEq/L 100 mL/hr at 02/16/13 0535    Principal Problem:   Diverticulitis of proximal descending colon Active Problems:   HYPOTHYROIDISM   DEPRESSION   ASTHMA   Type 2 diabetes mellitus   Morbid obesity   History of ulcerative colitis   H/O: hysterectomy   H/O diverticulitis of SIGMOID colon    Time spent: 35 minutes coordinating care.     Hennie Gosa  [facility] Pager [phone]. If 7PM-7AM, please contact night-coverage at [email or web address], password [facility] 02/16/2013, 4:34 PM  LOS: 2 days

## 2013-02-16 NOTE — Progress Notes (Signed)
Pateint tearful regarding a lot of stressors in her life, including sequelae of her medical issues. Offered encouragement. Patient examined and I agree with the assessment and plan  Violeta Gelinas, MD, MPH, FACS Pager: [phone]  02/16/2013 11:49 AM

## 2013-02-17 LAB — CBC
Hemoglobin: 10.3 g/dL — ABNORMAL LOW (ref 12.0–15.0)
MCHC: 33.6 g/dL (ref 30.0–36.0)
RDW: 13.9 % (ref 11.5–15.5)
WBC: 9.1 10*3/uL (ref 4.0–10.5)

## 2013-02-17 LAB — BASIC METABOLIC PANEL
GFR calc Af Amer: 83 mL/min — ABNORMAL LOW (ref >90)
GFR calc non Af Amer: 72 mL/min — ABNORMAL LOW (ref >90)
Potassium: 3.7 mEq/L (ref 3.5–5.1)
Sodium: 141 mEq/L (ref 135–145)

## 2013-02-17 LAB — GLUCOSE, CAPILLARY
Glucose-Capillary: 86 mg/dL (ref 70–99)
Glucose-Capillary: 101 mg/dL — ABNORMAL HIGH (ref 70–99)

## 2013-02-17 LAB — T3, FREE: T3, Free: 2.2 pg/mL — ABNORMAL LOW (ref 2.3–4.2)

## 2013-02-17 LAB — T4, FREE: Free T4: 1.15 ng/dL (ref 0.80–1.80)

## 2013-02-17 MED ORDER — PROMETHAZINE HCL 25 MG/ML IJ SOLN
12.5000 mg | Freq: Four times a day (QID) | INTRAMUSCULAR | Status: DC | PRN
  Administered 2013-02-17: 12.5 mg via INTRAVENOUS
  Filled 2013-02-17: qty 1

## 2013-02-17 NOTE — Progress Notes (Addendum)
[facility] PROGRESS NOTE  [name] WUJ:[id number] DOB: 24-Mar-1951 DOA: 02/14/2013 PCP: [name], MD  Assessment/Plan: 1-Diverticulitis: Third Episode. continue with Zosyn day 4 . Continue with IV fluids. Surgery and GI recommendation appreciated.  Colonoscopy out patient. Patient needs to follow up with surgery in 4 to 6 weeks.WBC trending down. Will dIscuss with ID options for oral regimen at time of discharge. Patient will need long course of antibiotics until she has surgery per Surgery recommendation. Pain better, started on clears.  2-Hypothyroidism: Continue with Synthroid. Increase TSH. Check Free T 3 and T4  3-Diabetes:  SSI. 4-Asthma: Stable. PRN, Nebulizer treatments.  5-Morbid Obesity 6-HTN: Hold Cozaar. Continue with metoprolol.  7-Depression, anxiety: Trazodone, Xanax PRN.  8-hypokalemia: replaced.   Code Status: Full Code.  Family Communication: Care discussed with patient.  Disposition Plan: Home at time of discharge.    Consultants:  [facility]  [facility].   Procedures:  None.  Antibiotics:  Zosyn:   HPI/Subjective: She is feeling better. Abdominal pain has improved. No nausea.   Objective: Filed Vitals:   02/16/13 0526 02/16/13 1347 02/16/13 2126 02/17/13 0614  BP: 142/63 129/62 115/79 102/66  Pulse: 78 65 74 70  Temp: 97.8 F (36.6 C) 97.8 F (36.6 C) 97.9 F (36.6 C) 97.9 F (36.6 C)  TempSrc: Oral  Oral Oral  Resp: 18 20 20 18   Height:      Weight:      SpO2: 95% 97% 96% 95%    Intake/Output Summary (Last 24 hours) at 02/17/13 1344 Last data filed at 02/17/13 0609  Gross per 24 hour  Intake   5378 ml  Output      0 ml  Net   5378 ml   Filed Weights   02/14/13 2227  Weight: 130.2 kg (287 lb 0.6 oz)    Exam:   General:  No distress.   Cardiovascular: S 1, S 2, RRR  Respiratory: CTA  Abdomen: BS present, soft, mild left lower quadrant pain, no rigidity.  Musculoskeletal: no edema.   Data Reviewed: Basic  Metabolic Panel:  Recent Labs Lab 02/14/13 1501 02/15/13 0855 02/16/13 0540 02/17/13 0640  NA 135 136 140 141  K 3.7 4.0 3.4* 3.7  CL 95* 96 104 107  CO2 29 31 30 29   GLUCOSE 125* 163* 144* 133*  BUN 9 12 7  5*  CREATININE 0.82 1.07 0.83 0.86  CALCIUM 9.6 8.8 8.6 8.7   Liver Function Tests:  Recent Labs Lab 02/14/13 1501  AST 28  ALT 26  ALKPHOS 103  BILITOT 0.5  PROT 8.2  ALBUMIN 3.7  CBC:  Recent Labs Lab 02/14/13 1501 02/15/13 0855 02/16/13 0540 02/17/13 0640  WBC 17.2* 13.4* 9.6 9.1  NEUTROABS 10.5*  --   --   --   HGB 14.3 12.0 10.7* 10.3*  HCT 41.8 36.5 31.9* 30.7*  MCV 82.9 84.9 83.5 84.1  PLT 351 315 240 244   Cardiac Enzymes: No results found for this basename: CKTOTAL, CKMB, CKMBINDEX, TROPONINI,  in the last 168 hours BNP (last 3 results) No results found for this basename: PROBNP,  in the last 8760 hours CBG:  Recent Labs Lab 02/16/13 0801 02/16/13 1219 02/16/13 1730 02/16/13 2133 02/17/13 0804  GLUCAP 122* 143* 86 101* 119*    No results found for this or any previous visit (from the past 240 hour(s)).   Studies: No results found.  Scheduled Meds: . aspirin  81 mg Oral Daily  . cholecalciferol  2,000 Units  Oral Daily  . enoxaparin (LOVENOX) injection  65 mg Subcutaneous Q24H  . famotidine  20 mg Oral QHS  . insulin aspart  0-9 Units Subcutaneous TID WC  . levothyroxine  175 mcg Oral QAC breakfast  . loratadine  10 mg Oral Daily  . metoprolol tartrate  25 mg Oral q morning - 10a  . mometasone-formoterol  2 puff Inhalation BID  . montelukast  10 mg Oral Daily  . pantoprazole  80 mg Oral Q1200  . piperacillin-tazobactam  3.375 g Intravenous Q8H  . simvastatin  10 mg Oral q1800  . traZODone  150 mg Oral QHS  . vitamin B-12  100 mcg Oral q morning - 10a  . zolpidem  5 mg Oral QHS   Continuous Infusions: . dextrose 5 % and 0.9 % NaCl with KCl 20 mEq/L 100 mL/hr at 02/17/13 0944    Principal Problem:   Diverticulitis of  proximal descending colon Active Problems:   HYPOTHYROIDISM   DEPRESSION   ASTHMA   Type 2 diabetes mellitus   Morbid obesity   History of ulcerative colitis   H/O: hysterectomy   H/O diverticulitis of SIGMOID colon    Time spent: 25 minutes coordinating care.     [name]  [facility] Pager [phone]. If 7PM-7AM, please contact night-coverage at [email or web address], password [facility] 02/17/2013, 1:44 PM  LOS: 3 days    Patient vomit this afternoon, had 3 loose BM. Will change diet to NPO. Check for C diff. Phenergan for nausea.

## 2013-02-17 NOTE — Progress Notes (Signed)
  Subjective: Pt's pain much better today.  Taking very little pain meds.  +flatus and BM's, urinating well.  Tolerating ice chips and sips with meds.  Not very hungry but willing to try clears today.  Pt ambulating well.    Objective: Vital signs in last 24 hours: Temp:  [97.8 F (36.6 C)-97.9 F (36.6 C)] 97.9 F (36.6 C) (04/06 1610) Pulse Rate:  [65-74] 70 (04/06 0614) Resp:  [18-20] 18 (04/06 0614) BP: (102-129)/(62-79) 102/66 mmHg (04/06 0614) SpO2:  [95 %-97 %] 95 % (04/06 0614) Last BM Date: 02/16/13  Intake/Output from previous day: 04/05 0701 - 04/06 0700 In: 5738 [P.O.:960; I.V.:4428; IV Piggyback:350] Out: -  Intake/Output this shift:    PE: Gen:  Alert, NAD, pleasant Abd: Soft, NT/ND, +BS, no HSM   Lab Results:   Recent Labs  02/16/13 0540 02/17/13 0640  WBC 9.6 9.1  HGB 10.7* 10.3*  HCT 31.9* 30.7*  PLT 240 244   BMET  Recent Labs  02/16/13 0540 02/17/13 0640  NA 140 141  K 3.4* 3.7  CL 104 107  CO2 30 29  GLUCOSE 144* 133*  BUN 7 5*  CREATININE 0.83 0.86  CALCIUM 8.6 8.7   PT/INR No results found for this basename: LABPROT, INR,  in the last 72 hours CMP     Component Value Date/Time   NA 141 02/17/2013 0640   K 3.7 02/17/2013 0640   CL 107 02/17/2013 0640   CO2 29 02/17/2013 0640   GLUCOSE 133* 02/17/2013 0640   BUN 5* 02/17/2013 0640   CREATININE 0.86 02/17/2013 0640   CREATININE 1.10 12/04/2012 1015   CALCIUM 8.7 02/17/2013 0640   PROT 8.2 02/14/2013 1501   ALBUMIN 3.7 02/14/2013 1501   AST 28 02/14/2013 1501   ALT 26 02/14/2013 1501   ALKPHOS 103 02/14/2013 1501   BILITOT 0.5 02/14/2013 1501   GFRNONAA 72* 02/17/2013 0640   GFRAA 83* 02/17/2013 0640   Lipase     Component Value Date/Time   LIPASE 11 06/27/2008 0333       Studies/Results: No results found.  Anti-infectives: Anti-infectives   Start     Dose/Rate Route Frequency Ordered Stop   02/15/13 0600  piperacillin-tazobactam (ZOSYN) IVPB 3.375 g     3.375 g 12.5 mL/hr over 240  Minutes Intravenous 3 times per day 02/14/13 2233     02/14/13 1945  piperacillin-tazobactam (ZOSYN) IVPB 3.375 g     3.375 g 12.5 mL/hr over 240 Minutes Intravenous  Once 02/14/13 1944 02/15/13 0028       Assessment/Plan Recurrent diverticulitis (3rd episode), but appears to be in a new location (proximal descending colon)  1. Conservative management, allow clears since pain much improved 2. IVF, pain control, antibiotics  3. SCD's Lovenox, ambulation off the floor okay, IS  4. Discharge when pain better controlled and tolerating diet  5. Will need long term antibiotics upon discharge likely until surgery date and she'll need a colonoscopy in approximately 6 weeks pre-op (has GI appt on Tuesday, but will need to reschedule)     LOS: 3 days    DORT, Heaven Meeker 02/17/2013, 9:02 AM Pager: [phone]

## 2013-02-17 NOTE — Progress Notes (Signed)
[facility] Progress Note  Subjective: Less abd pain, feel better.   Objective:  Vital signs in last 24 hours: Temp:  [97.8 F (36.6 C)-97.9 F (36.6 C)] 97.9 F (36.6 C) (04/06 1610) Pulse Rate:  [65-74] 70 (04/06 0614) Resp:  [18-20] 18 (04/06 9604) BP: (102-129)/(62-79) 102/66 mmHg (04/06 0614) SpO2:  [95 %-97 %] 95 % (04/06 0614) Last BM Date: 02/16/13 General:   Alert, well-developed, obese, white female in NAD Heart:  Regular rate and rhythm; no murmurs Abdomen:  Soft, nontendern and nondistended. Normal bowel sounds, without guarding, and without rebound.   Extremities:  Without edema. Neurologic:  Alert and  oriented x4;  grossly normal neurologically. Psych:  Alert and cooperative. Normal mood and affect.  Intake/Output from previous day: 04/05 0701 - 04/06 0700 In: 5738 [P.O.:960; I.V.:4428; IV Piggyback:350] Out: -  Intake/Output this shift:   Lab Results:  Recent Labs  02/15/13 0855 02/16/13 0540 02/17/13 0640  WBC 13.4* 9.6 9.1  HGB 12.0 10.7* 10.3*  HCT 36.5 31.9* 30.7*  PLT 315 240 244   BMET  Recent Labs  02/15/13 0855 02/16/13 0540 02/17/13 0640  NA 136 140 141  K 4.0 3.4* 3.7  CL 96 104 107  CO2 31 30 29   GLUCOSE 163* 144* 133*  BUN 12 7 5*  CREATININE 1.07 0.83 0.86  CALCIUM 8.8 8.6 8.7   LFT  Recent Labs  02/14/13 1501  PROT 8.2  ALBUMIN 3.7  AST 28  ALT 26  ALKPHOS 103  BILITOT 0.5    IMPRESSION:  * Multiple episodes of sigmoid diverticulitis. Diverticulitis in proximal descending colon is improving on current treatment.   RECOMMENDATION:  * Elective colonoscopy in a few weeks as outpatient with Dr. [name]. Suggest ID recommendations for antibiotics given frequent recurrences. Advance diet as tolerated. GI signing off. Schedule follow up with Dr. [name] 2 weeks after discharge and he can set up colonoscopy.  Principal Problem:   Diverticulitis of proximal descending colon Active Problems:  HYPOTHYROIDISM   DEPRESSION   ASTHMA   Type 2 diabetes mellitus   Morbid obesity   History of ulcerative colitis   H/O: hysterectomy   H/O diverticulitis of SIGMOID colon   LOS: 3 days   Anilah Huck T. Russella Dar MD [facility] 02/17/2013, 10:38 AM

## 2013-02-17 NOTE — Progress Notes (Signed)
Patient is improving - less tender Clear liquids today.  Continue abx.  [name]. Corliss Skains, MD, [facility]  02/17/2013 9:39 AM

## 2013-02-18 DIAGNOSIS — E119 Type 2 diabetes mellitus without complications: Secondary | ICD-10-CM

## 2013-02-18 DIAGNOSIS — E876 Hypokalemia: Secondary | ICD-10-CM

## 2013-02-18 LAB — BASIC METABOLIC PANEL
BUN: 4 mg/dL — ABNORMAL LOW (ref 6–23)
Chloride: 107 mEq/L (ref 96–112)
Creatinine, Ser: 0.81 mg/dL (ref 0.50–1.10)
GFR calc Af Amer: 90 mL/min — ABNORMAL LOW (ref >90)
GFR calc non Af Amer: 77 mL/min — ABNORMAL LOW (ref >90)

## 2013-02-18 LAB — GLUCOSE, CAPILLARY: Glucose-Capillary: 131 mg/dL — ABNORMAL HIGH (ref 70–99)

## 2013-02-18 LAB — CBC
HCT: 30.1 % — ABNORMAL LOW (ref 36.0–46.0)
MCH: 28.3 pg (ref 26.0–34.0)
MCHC: 33.9 g/dL (ref 30.0–36.0)
MCV: 83.6 fL (ref 78.0–100.0)
RDW: 14.1 % (ref 11.5–15.5)

## 2013-02-18 MED ORDER — LEVOTHYROXINE SODIUM 200 MCG PO TABS
200.0000 ug | ORAL_TABLET | Freq: Every day | ORAL | Status: DC
  Administered 2013-02-19 – 2013-02-21 (×3): 200 ug via ORAL
  Filled 2013-02-18 (×4): qty 1

## 2013-02-18 NOTE — Progress Notes (Signed)
Pt was sad, tearful, and depressed this morning. Pt shared that she has a history of depression and has tried several anti-depressants in the past. Pt reported she is not currently taking an anti-depressant and feels like she needs one. Pt shared she has a doctor that she plans to see to prescribe her an anti-depressant again. Pt shared stressors of being worried about the possibility of losing her job for being out with sickness so long. Pt also shared that her elderly 64 year old mother lives with her and needs care. Pt reported she has support of sister who helps take care of her mother. Pt shared that she feels bad that she is leaving her sister to take care of mother all by herself. Pt brightened after talking with staff and having a few phone calls. Pt shared that she talked to Wyman Songster in HR about FMLA and felt much better. Pt reported that talking to [place] lifted her spirits. Pt has continued to improve in mood today and reports feeling much better. Pt reported, "I just needed to throw myself a pity party and talk about it but I am fine now."

## 2013-02-18 NOTE — Progress Notes (Signed)
Spoke with [name] at bedside about [name] to [employer] program for [employer] and their dependents with [employer]. She reports she did not follow up with [facility] after last admission because she got sick again and ended up back in the hospital. Patient interested in [facility] program for her diabetes. States she still has packet and information. Also mentions she may have to have surgery. Offered support and will continue to follow. [name] appreciative of visit.  Tammy Noble, MSN- Ed, RN,BSN, [facility], [phone]

## 2013-02-18 NOTE — Progress Notes (Signed)
[facility] PROGRESS NOTE  [name] WUJ:[id number] DOB: 09-Oct-1951 DOA: 02/14/2013 PCP: [name], MD  Assessment/Plan: 1-Diverticulitis: Third Episode. continue with Zosyn day 5 . Continue with IV fluids. Surgery and GI recommendation appreciated.  Colonoscopy out patient. .WBC normal. Will dIscuss with ID options for oral regimen at time of discharge. Patient will need long course of antibiotics until she has surgery per Surgery recommendation.  Relates less abdominal pain. Was able to drink sprites this am. Nausea improved yesterday after phenergan. 2-Hypothyroidism:  Increase TSH. Free T 3 low at 2.2 and T4 normal. I will increase synthroid.  3-Diabetes:  SSI. 4-Asthma: Stable. PRN, Nebulizer treatments.  5-Morbid Obesity 6-HTN: Hold Cozaar. Continue with metoprolol.  7-Depression, anxiety: Trazodone, Xanax PRN. Patient feeling sad, overwhelmed with medical condition and family situation. She denies suicidal thought. Patient has been in the past on Cymbalta but this medication cause constipation. Would avoid Cymbalta. She would like to start Paxil. I was going to start Paxil but a rare adverse effect is colitis. I will discussed further with patient.   8-hypokalemia: resolved.   Code Status: Full Code.  Family Communication: Care discussed with patient.  Disposition Plan: Home at time of discharge.    Consultants:  [facility]  [facility].   Procedures:  None.  Antibiotics:  Zosyn:   HPI/Subjective:  Was able to drink sprite. No nausea. No significant pain. She was crying, she is feeling sad and overwhelm with her medical condition, afraid of loosing her job, worry about her mother. She denies suicidal thought. I offer her to speak with psychiatrist and or chaplain. She said she is a woman of faith. She will be ok. I filled  FMLA paper for her, the first part of it. We talk about considering start Paxil.  Objective: Filed Vitals:   02/17/13 2120 02/18/13 0510  02/18/13 0845 02/18/13 1300  BP: 158/87 121/65 116/68 118/87  Pulse: 75 70 68 76  Temp: 98.1 F (36.7 C) 97.9 F (36.6 C) 97 F (36.1 C) 97.6 F (36.4 C)  TempSrc: Oral Oral Oral Oral  Resp: 18 18 12 18   Height:      Weight:      SpO2: 97% 96% 97% 98%    Intake/Output Summary (Last 24 hours) at 02/18/13 1655 Last data filed at 02/18/13 1416  Gross per 24 hour  Intake   2207 ml  Output      0 ml  Net   2207 ml   Filed Weights   02/14/13 2227  Weight: 130.2 kg (287 lb 0.6 oz)    Exam:   General:  No distress.   Cardiovascular: S 1, S 2, RRR  Respiratory: CTA  Abdomen: BS present, soft, mild left lower quadrant pain, no rigidity.  Musculoskeletal: no edema.   Data Reviewed: Basic Metabolic Panel:  Recent Labs Lab 02/14/13 1501 02/15/13 0855 02/16/13 0540 02/17/13 0640 02/18/13 0657  NA 135 136 140 141 140  K 3.7 4.0 3.4* 3.7 3.8  CL 95* 96 104 107 107  CO2 29 31 30 29 25   GLUCOSE 125* 163* 144* 133* 122*  BUN 9 12 7  5* 4*  CREATININE 0.82 1.07 0.83 0.86 0.81  CALCIUM 9.6 8.8 8.6 8.7 8.7   Liver Function Tests:  Recent Labs Lab 02/14/13 1501  AST 28  ALT 26  ALKPHOS 103  BILITOT 0.5  PROT 8.2  ALBUMIN 3.7  CBC:  Recent Labs Lab 02/14/13 1501 02/15/13 0855 02/16/13 0540 02/17/13 0640 02/18/13 0657  WBC  17.2* 13.4* 9.6 9.1 10.1  NEUTROABS 10.5*  --   --   --   --   HGB 14.3 12.0 10.7* 10.3* 10.2*  HCT 41.8 36.5 31.9* 30.7* 30.1*  MCV 82.9 84.9 83.5 84.1 83.6  PLT 351 315 240 244 276   Cardiac Enzymes: No results found for this basename: CKTOTAL, CKMB, CKMBINDEX, TROPONINI,  in the last 168 hours BNP (last 3 results) No results found for this basename: PROBNP,  in the last 8760 hours CBG:  Recent Labs Lab 02/17/13 1206 02/17/13 1708 02/17/13 2218 02/18/13 0806 02/18/13 1211  GLUCAP 129* 96 106* 128* 131*    Recent Results (from the past 240 hour(s))  CLOSTRIDIUM DIFFICILE BY PCR     Status: None   Collection Time     02/17/13  8:43 PM      Result Value Range Status   C difficile by pcr NEGATIVE  NEGATIVE Final     Studies: No results found.  Scheduled Meds: . aspirin  81 mg Oral Daily  . cholecalciferol  2,000 Units Oral Daily  . enoxaparin (LOVENOX) injection  65 mg Subcutaneous Q24H  . famotidine  20 mg Oral QHS  . insulin aspart  0-9 Units Subcutaneous TID WC  . levothyroxine  175 mcg Oral QAC breakfast  . loratadine  10 mg Oral Daily  . metoprolol tartrate  25 mg Oral q morning - 10a  . mometasone-formoterol  2 puff Inhalation BID  . montelukast  10 mg Oral Daily  . pantoprazole  80 mg Oral Q1200  . piperacillin-tazobactam  3.375 g Intravenous Q8H  . simvastatin  10 mg Oral q1800  . traZODone  150 mg Oral QHS  . vitamin B-12  100 mcg Oral q morning - 10a  . zolpidem  5 mg Oral QHS   Continuous Infusions: . dextrose 5 % and 0.9 % NaCl with KCl 20 mEq/L 100 mL/hr at 02/18/13 1416    Principal Problem:   Diverticulitis of proximal descending colon Active Problems:   HYPOTHYROIDISM   DEPRESSION   ASTHMA   Type 2 diabetes mellitus   Morbid obesity   History of ulcerative colitis   H/O: hysterectomy   H/O diverticulitis of SIGMOID colon    Time spent: 25 minutes coordinating care.     Kodey Xue  [facility] Pager [phone]. If 7PM-7AM, please contact night-coverage at [email or web address], password [facility] 02/18/2013, 4:55 PM  LOS: 4 days    Patient vomit this afternoon, had 3 loose BM. Will change diet to NPO. Check for C diff. Phenergan for nausea.

## 2013-02-18 NOTE — Progress Notes (Addendum)
  Subjective: Pt. Did not have a good night.  She is tearful, concerned with the amount of time she has been off work with recurrent hospitalizations. She had an episode of emesis yesterday morning following clear liquid breakfast.  She then developed diarrhea, now on c. Diff precautions, results are pending.  Reports her pain is under good control.  Remains in left lower and upper quadrant.  Nausea improved with anti-emetics.  Denies chest pains or shortness of breath.  She has been up in the room, sitting in the chair without difficulties.    Objective: Vital signs in last 24 hours: Temp:  [97.9 F (36.6 C)-98.3 F (36.8 C)] 97.9 F (36.6 C) (04/07 0510) Pulse Rate:  [67-75] 70 (04/07 0510) Resp:  [15-18] 18 (04/07 0510) BP: (121-158)/(65-87) 121/65 mmHg (04/07 0510) SpO2:  [96 %-98 %] 96 % (04/07 0510) Last BM Date: 02/18/13  Intake/Output from previous day: 04/06 0701 - 04/07 0700 In: 2499 [P.O.:240; I.V.:2109; IV Piggyback:150] Out: -  Intake/Output this shift:    General appearance: alert, cooperative, no distress and moderately obese Head: Normocephalic, without obvious abnormality, atraumatic Resp: clear to auscultation bilaterally Chest wall: no tenderness Cardio: regular rate and rhythm, S1, S2 normal, no murmur, click, rub or gallop GI: soft round, TTP LLQ LUQ, positive bowel sounds, no masses or hepatomegaly Extremities: extremities normal, atraumatic, no cyanosis or edema Pulses: 2+ and symmetric Skin: Skin color, texture, turgor normal. No rashes or lesions  Lab Results:   Recent Labs  02/17/13 0640 02/18/13 0657  WBC 9.1 10.1  HGB 10.3* 10.2*  HCT 30.7* 30.1*  PLT 244 276   BMET  Recent Labs  02/17/13 0640 02/18/13 0657  NA 141 140  K 3.7 3.8  CL 107 107  CO2 29 25  GLUCOSE 133* 122*  BUN 5* 4*  CREATININE 0.86 0.81  CALCIUM 8.7 8.7    Studies/Results: No results found.  Anti-infectives: Anti-infectives   Start     Dose/Rate Route  Frequency Ordered Stop   02/15/13 0600  piperacillin-tazobactam (ZOSYN) IVPB 3.375 g     3.375 g 12.5 mL/hr over 240 Minutes Intravenous 3 times per day 02/14/13 2233     02/14/13 1945  piperacillin-tazobactam (ZOSYN) IVPB 3.375 g     3.375 g 12.5 mL/hr over 240 Minutes Intravenous  Once 02/14/13 1944 02/15/13 0028      Assessment/Plan: Recurrent diverticulitis (3rd episode), but appears to be in a new location (proximal descending colon)  1. Conservative management, allow clear liquids today and slowly increase as tolerated. 2. IVF, pain control, antibiotics  3. SCD's Lovenox, ambulation off the floor okay, IS  4. Discharge when pain better controlled and tolerating diet  5. Will need long term antibiotics upon discharge likely until surgery date and she'll need a colonoscopy in approximately 6 weeks pre-op (has GI appt on Tuesday, but will need to reschedule)  Diabetes mellitus: continue with QID accuchecks and sliding scale.    LOS: 4 days    Toiya Morrish, ANP-BC 02/18/2013

## 2013-02-18 NOTE — Progress Notes (Signed)
She is minimally tender, she has no more n/v/d.  I think still may be able to treat conservatively and don't know this is related to diverticulitis.  Will keep at clears.  If no better or not improving tomorrow then will consider ertapenem and/or repeat ct.  We discussed possibility of surgery if doesn't get better.

## 2013-02-19 ENCOUNTER — Ambulatory Visit: Payer: 59 | Admitting: Internal Medicine

## 2013-02-19 LAB — GLUCOSE, CAPILLARY: Glucose-Capillary: 115 mg/dL — ABNORMAL HIGH (ref 70–99)

## 2013-02-19 MED ORDER — AMOXICILLIN-POT CLAVULANATE 875-125 MG PO TABS
1.0000 | ORAL_TABLET | Freq: Two times a day (BID) | ORAL | Status: DC
  Administered 2013-02-19 – 2013-02-21 (×5): 1 via ORAL
  Filled 2013-02-19 (×5): qty 1

## 2013-02-19 MED ORDER — METRONIDAZOLE 500 MG PO TABS
500.0000 mg | ORAL_TABLET | Freq: Three times a day (TID) | ORAL | Status: DC
  Administered 2013-02-19 – 2013-02-21 (×7): 500 mg via ORAL
  Filled 2013-02-19 (×9): qty 1

## 2013-02-19 MED ORDER — LOSARTAN POTASSIUM 50 MG PO TABS
50.0000 mg | ORAL_TABLET | Freq: Every day | ORAL | Status: DC
  Administered 2013-02-19 – 2013-02-21 (×3): 50 mg via ORAL
  Filled 2013-02-19 (×5): qty 1

## 2013-02-19 NOTE — Progress Notes (Signed)
[facility] PROGRESS NOTE  KYRENE LONGAN ZOX:[id number] DOB: 05-07-1951 DOA: 02/14/2013 PCP: [name], MD  Assessment/Plan: 1-Diverticulitis: Third Episode, different anatomical location.  Patient received Zosyn for 5 days. She received IV fluids. Surgery and GI recommendation appreciated.  Colonoscopy out patient. .WBC normal. Patient will need long course of antibiotics until she has surgery per Surgery recommendation. Patient was started on Augmentin and flagyl by surgery. Patient was able to tolerates clear 4-7. Diet advance to full liquid today.   2-Hypothyroidism:  Increase TSH. Free T 3 low at 2.2 and T4 normal. I increase synthroid to 200 mcg.  3-Diabetes:  SSI. 4-Asthma: Stable. PRN, Nebulizer treatments.  5-Morbid Obesity 6-HTN: resume Cozaar. Continue with metoprolol.  7-Depression, anxiety: Trazodone, Xanax PRN. Patient is feeling better today. I explain to her rare adverse effect of Paxil, could cause colitis. She agree not to start Paxil. She is feeling better. I offered to ask Psych consultation. She decline at this time. She is feeling better.  8-hypokalemia: resolved.   Code Status: Full Code.  Family Communication: Care discussed with patient.  Disposition Plan: Home at time of discharge.    Consultants:  [facility]  [facility].   Procedures:  None.  Antibiotics:  Zosyn:   HPI/Subjective: Feeling better today. Abdominal pain better. Was able to eat grits this morning. No nausea, no vomiting.   Objective: Filed Vitals:   02/19/13 0527 02/19/13 0900 02/19/13 0920 02/19/13 1200  BP: 156/73 146/77  158/71  Pulse: 80 78  65  Temp: 98.9 F (37.2 C) 98.4 F (36.9 C)  97.7 F (36.5 C)  TempSrc: Oral Oral  Oral  Resp: 16 16  18   Height:      Weight:      SpO2: 96% 95% 96% 98%    Intake/Output Summary (Last 24 hours) at 02/19/13 1306 Last data filed at 02/19/13 0700  Gross per 24 hour  Intake   1463 ml  Output      0 ml  Net   1463 ml   Filed  Weights   02/14/13 2227  Weight: 130.2 kg (287 lb 0.6 oz)    Exam:   General:  No distress.   Cardiovascular: S 1, S 2, RRR  Respiratory: CTA  Abdomen: BS present, soft, NT, no rigidity.  Musculoskeletal: no edema.   Data Reviewed: Basic Metabolic Panel:  Recent Labs Lab 02/14/13 1501 02/15/13 0855 02/16/13 0540 02/17/13 0640 02/18/13 0657  NA 135 136 140 141 140  K 3.7 4.0 3.4* 3.7 3.8  CL 95* 96 104 107 107  CO2 29 31 30 29 25   GLUCOSE 125* 163* 144* 133* 122*  BUN 9 12 7  5* 4*  CREATININE 0.82 1.07 0.83 0.86 0.81  CALCIUM 9.6 8.8 8.6 8.7 8.7   Liver Function Tests:  Recent Labs Lab 02/14/13 1501  AST 28  ALT 26  ALKPHOS 103  BILITOT 0.5  PROT 8.2  ALBUMIN 3.7  CBC:  Recent Labs Lab 02/14/13 1501 02/15/13 0855 02/16/13 0540 02/17/13 0640 02/18/13 0657  WBC 17.2* 13.4* 9.6 9.1 10.1  NEUTROABS 10.5*  --   --   --   --   HGB 14.3 12.0 10.7* 10.3* 10.2*  HCT 41.8 36.5 31.9* 30.7* 30.1*  MCV 82.9 84.9 83.5 84.1 83.6  PLT 351 315 240 244 276   Cardiac Enzymes: No results found for this basename: CKTOTAL, CKMB, CKMBINDEX, TROPONINI,  in the last 168 hours BNP (last 3 results) No results found for this basename: PROBNP,  in the last 8760 hours CBG:  Recent Labs Lab 02/18/13 1211 02/18/13 1718 02/18/13 2137 02/19/13 0741 02/19/13 1209  GLUCAP 131* 98 102* 116* 108*    Recent Results (from the past 240 hour(s))  CLOSTRIDIUM DIFFICILE BY PCR     Status: None   Collection Time    02/17/13  8:43 PM      Result Value Range Status   C difficile by pcr NEGATIVE  NEGATIVE Final     Studies: No results found.  Scheduled Meds: . amoxicillin-clavulanate  1 tablet Oral Q12H  . aspirin  81 mg Oral Daily  . cholecalciferol  2,000 Units Oral Daily  . enoxaparin (LOVENOX) injection  65 mg Subcutaneous Q24H  . famotidine  20 mg Oral QHS  . insulin aspart  0-9 Units Subcutaneous TID WC  . levothyroxine  200 mcg Oral QAC breakfast  . loratadine   10 mg Oral Daily  . metoprolol tartrate  25 mg Oral q morning - 10a  . metroNIDAZOLE  500 mg Oral Q8H  . mometasone-formoterol  2 puff Inhalation BID  . montelukast  10 mg Oral Daily  . pantoprazole  80 mg Oral Q1200  . simvastatin  10 mg Oral q1800  . traZODone  150 mg Oral QHS  . vitamin B-12  100 mcg Oral q morning - 10a  . zolpidem  5 mg Oral QHS   Continuous Infusions:    Principal Problem:   Diverticulitis of proximal descending colon Active Problems:   HYPOTHYROIDISM   DEPRESSION   ASTHMA   Type 2 diabetes mellitus   Morbid obesity   History of ulcerative colitis   H/O: hysterectomy   H/O diverticulitis of SIGMOID colon    Time spent: 25 minutes coordinating care.     REGALADO,BELKYS  [facility] Pager [phone]. If 7PM-7AM, please contact night-coverage at [email or web address], password [facility] 02/19/2013, 1:06 PM  LOS: 5 days

## 2013-02-19 NOTE — Progress Notes (Signed)
Agree with above, feels much better today, already tolerated some full liquids

## 2013-02-19 NOTE — Progress Notes (Addendum)
  Subjective: Pt. Doing much better today.  No further episodes of n/v/d.  Ambulating in the room.  Tolerating clear liquid diet yesterday.  Continues to have abdominal pain, although it has improved.  Denies chills, sweats.  Denies chest pains or shortness of breath.  Objective: Vital signs in last 24 hours: Temp:  [97 F (36.1 C)-98.9 F (37.2 C)] 98.9 F (37.2 C) (04/08 0527) Pulse Rate:  [68-80] 80 (04/08 0527) Resp:  [12-18] 16 (04/08 0527) BP: (116-156)/(63-87) 156/73 mmHg (04/08 0527) SpO2:  [91 %-98 %] 96 % (04/08 0527) Last BM Date: 02/17/13  Intake/Output from previous day: 04/07 0701 - 04/08 0700 In: 1223 [P.O.:240; I.V.:842; IV Piggyback:141] Out: -  Intake/Output this shift:    General appearance: alert, cooperative and mild distress Resp: clear to auscultation bilaterally Cardio: regular rate and rhythm, S1, S2 normal, no murmur, click, rub or gallop GI: soft, round, ttp LLQ and LUQ, no masses or hepatomegaly, active bowel sounds x4 quadrants. Extremities: extremities normal, atraumatic, no cyanosis or edema Pulses: 2+ and symmetric Skin: Skin color, texture, turgor normal. No rashes or lesions Neurologic: Alert and oriented X 3, normal strength and tone. Normal symmetric reflexes. Normal coordination and gait  Lab Results:   Recent Labs  02/17/13 0640 02/18/13 0657  WBC 9.1 10.1  HGB 10.3* 10.2*  HCT 30.7* 30.1*  PLT 244 276   BMET  Recent Labs  02/17/13 0640 02/18/13 0657  NA 141 140  K 3.7 3.8  CL 107 107  CO2 29 25  GLUCOSE 133* 122*  BUN 5* 4*  CREATININE 0.86 0.81  CALCIUM 8.7 8.7    Studies/Results: No results found.  Anti-infectives: Anti-infectives   Start     Dose/Rate Route Frequency Ordered Stop   02/19/13 1000  amoxicillin-clavulanate (AUGMENTIN) 875-125 MG per tablet 1 tablet     1 tablet Oral Every 12 hours 02/19/13 0834     02/19/13 0845  metroNIDAZOLE (FLAGYL) tablet 500 mg     500 mg Oral 3 times per day 02/19/13  0834     02/15/13 0600  piperacillin-tazobactam (ZOSYN) IVPB 3.375 g  Status:  Discontinued     3.375 g 12.5 mL/hr over 240 Minutes Intravenous 3 times per day 02/14/13 2233 02/19/13 0834   02/14/13 1945  piperacillin-tazobactam (ZOSYN) IVPB 3.375 g     3.375 g 12.5 mL/hr over 240 Minutes Intravenous  Once 02/14/13 1944 02/15/13 0028      Assessment/Plan: Recurrent diverticulitis (3rd episode), but appears to be in a new location (proximal descending colon)  1. Conservative management, advance to full liquid diet today. 2. IVF, pain control. 3. Change Zosyn for augmentin and flagyl.  4. SCD's Lovenox, ambulation off the floor okay, IS  5. Discharge when pain better controlled and tolerating diet  5. Will need long term antibiotics upon discharge likely until surgery date and she'll need a colonoscopy in approximately 6 weeks pre-op (has GI appt on Tuesday, but will need to reschedule).   LOS: 5 days    Geronimo Diliberto ANP-BC 02/19/2013

## 2013-02-20 LAB — MAGNESIUM: Magnesium: 1.4 mg/dL — ABNORMAL LOW (ref 1.5–2.5)

## 2013-02-20 LAB — GLUCOSE, CAPILLARY
Glucose-Capillary: 93 mg/dL (ref 70–99)
Glucose-Capillary: 111 mg/dL — ABNORMAL HIGH (ref 70–99)

## 2013-02-20 LAB — CBC
HCT: 30.9 % — ABNORMAL LOW (ref 36.0–46.0)
Hemoglobin: 10.5 g/dL — ABNORMAL LOW (ref 12.0–15.0)
MCHC: 34 g/dL (ref 30.0–36.0)
RBC: 3.7 MIL/uL — ABNORMAL LOW (ref 3.87–5.11)

## 2013-02-20 LAB — BASIC METABOLIC PANEL
BUN: 3 mg/dL — ABNORMAL LOW (ref 6–23)
CO2: 24 mEq/L (ref 19–32)
Chloride: 107 mEq/L (ref 96–112)
GFR calc non Af Amer: 79 mL/min — ABNORMAL LOW (ref >90)
Glucose, Bld: 110 mg/dL — ABNORMAL HIGH (ref 70–99)
Potassium: 3.4 mEq/L — ABNORMAL LOW (ref 3.5–5.1)

## 2013-02-20 MED ORDER — POTASSIUM CHLORIDE CRYS ER 20 MEQ PO TBCR
40.0000 meq | EXTENDED_RELEASE_TABLET | Freq: Once | ORAL | Status: AC
  Administered 2013-02-20: 40 meq via ORAL
  Filled 2013-02-20: qty 2

## 2013-02-20 NOTE — Progress Notes (Signed)
Agree with above 

## 2013-02-20 NOTE — Progress Notes (Signed)
[facility] PROGRESS NOTE  EVAROSE ALTLAND ZOX:[id number] DOB: 1950/12/16 DOA: 02/14/2013 PCP: [name], MD  Assessment/Plan: 1-Diverticulitis: Third Episode, different anatomical location.  Patient received Zosyn for 5 days. She received IV fluids. Surgery and GI recommendation appreciated.  Colonoscopy in the outpatient settings in about 6 weeks. WBC WNL. Patient will need long course of antibiotics until she has surgery. Patient started on Augmentin and flagyl. Diet advance to low residue, if tolerated well home in am 02/21/13  2-Hypothyroidism: Continue increased synthroid at 200 mcg. TSH and Free T3-T4 in 3 months for further adjustments.  3-Diabetes:  Continue SSI and low carb diet  4-Asthma: Stable. Continue PRN Nebulizer treatments.   5-Morbid Obesity: low calorie diet and exercise discussed with patient.  6-HTN: well controlled. Continue cozaar and toprol  7-Depression/anxiety: Continue Trazodone and PRN Xanax. Patient will discuss with PCP at discharge for long term therapy.  8-hypokalemia: repleting.   Code Status: Full Code.  Family Communication: Care discussed with patient.  Disposition Plan: Home at time of discharge.    Consultants:  [facility]  [facility].   Procedures:  None.  Antibiotics:  Augmentin and flagyl   HPI/Subjective: Afebrile, no acute complaints. Tolerating PO's and w/o abdominal pain, nausea or vomiting.   Objective: Filed Vitals:   02/19/13 1800 02/19/13 2120 02/19/13 2130 02/20/13 0502  BP: 143/88 145/74  130/61  Pulse: 64 71  70  Temp: 98.2 F (36.8 C) 98 F (36.7 C)  97.9 F (36.6 C)  TempSrc: Oral Oral  Oral  Resp: 18 19  20   Height:      Weight:      SpO2: 98% 97% 97% 97%    Intake/Output Summary (Last 24 hours) at 02/20/13 1319 Last data filed at 02/20/13 0930  Gross per 24 hour  Intake    340 ml  Output    350 ml  Net    -10 ml   Filed Weights   02/14/13 2227  Weight: 130.2 kg (287 lb 0.6 oz)     Exam:   General:  No distress; afebrile and tolerating PO's  Cardiovascular: S 1, S 2, RRR  Respiratory: CTA  Abdomen: BS present, soft, NT, no rigidity.  Musculoskeletal: no edema.   Data Reviewed: Basic Metabolic Panel:  Recent Labs Lab 02/15/13 0855 02/16/13 0540 02/17/13 0640 02/18/13 0657 02/20/13 0525 02/20/13 1000  NA 136 140 141 140 141  --   K 4.0 3.4* 3.7 3.8 3.4*  --   CL 96 104 107 107 107  --   CO2 31 30 29 25 24   --   GLUCOSE 163* 144* 133* 122* 110*  --   BUN 12 7 5* 4* 3*  --   CREATININE 1.07 0.83 0.86 0.81 0.80  --   CALCIUM 8.8 8.6 8.7 8.7 8.7  --   MG  --   --   --   --   --  1.4*   Liver Function Tests:  Recent Labs Lab 02/14/13 1501  AST 28  ALT 26  ALKPHOS 103  BILITOT 0.5  PROT 8.2  ALBUMIN 3.7  CBC:  Recent Labs Lab 02/14/13 1501 02/15/13 0855 02/16/13 0540 02/17/13 0640 02/18/13 0657 02/20/13 0525  WBC 17.2* 13.4* 9.6 9.1 10.1 6.3  NEUTROABS 10.5*  --   --   --   --   --   HGB 14.3 12.0 10.7* 10.3* 10.2* 10.5*  HCT 41.8 36.5 31.9* 30.7* 30.1* 30.9*  MCV 82.9 84.9 83.5 84.1 83.6 83.5  PLT 351 315 240 244 276 259   CBG:  Recent Labs Lab 02/19/13 0741 02/19/13 1209 02/19/13 1700 02/19/13 2158 02/20/13 0757  GLUCAP 116* 108* 84 115* 93    Recent Results (from the past 240 hour(s))  CLOSTRIDIUM DIFFICILE BY PCR     Status: None   Collection Time    02/17/13  8:43 PM      Result Value Range Status   C difficile by pcr NEGATIVE  NEGATIVE Final     Studies: No results found.  Scheduled Meds: . amoxicillin-clavulanate  1 tablet Oral Q12H  . aspirin  81 mg Oral Daily  . cholecalciferol  2,000 Units Oral Daily  . enoxaparin (LOVENOX) injection  65 mg Subcutaneous Q24H  . famotidine  20 mg Oral QHS  . insulin aspart  0-9 Units Subcutaneous TID WC  . levothyroxine  200 mcg Oral QAC breakfast  . loratadine  10 mg Oral Daily  . losartan  50 mg Oral Q1200  . metoprolol tartrate  25 mg Oral q morning - 10a   . metroNIDAZOLE  500 mg Oral Q8H  . mometasone-formoterol  2 puff Inhalation BID  . montelukast  10 mg Oral Daily  . pantoprazole  80 mg Oral Q1200  . potassium chloride  40 mEq Oral Once  . simvastatin  10 mg Oral q1800  . traZODone  150 mg Oral QHS  . vitamin B-12  100 mcg Oral q morning - 10a  . zolpidem  5 mg Oral QHS   Continuous Infusions:    Principal Problem:   Diverticulitis of proximal descending colon Active Problems:   HYPOTHYROIDISM   DEPRESSION   ASTHMA   Type 2 diabetes mellitus   Morbid obesity   History of ulcerative colitis   H/O: hysterectomy   H/O diverticulitis of SIGMOID colon    Time spent: > 25 minutes coordinating care.     [facility]  [facility] Pager [phone]. If 7PM-7AM, please contact night-coverage at [email or web address], password [facility] 02/20/2013, 1:19 PM  LOS: 6 days

## 2013-02-20 NOTE — Plan of Care (Signed)
Problem: Food- and Nutrition-Related Knowledge Deficit (NB-1.1) Goal: Nutrition education Formal process to instruct or train a patient/client in a skill or to impart knowledge to help patients/clients voluntarily manage or modify food choices and eating behavior to maintain or improve health. Outcome: Completed/Met Date Met:  02/20/13 Nutrition Education Note  RD consulted for nutrition education regarding Nutrition Management for Diverticulosis/Diverticulitis.   RD provided both "Low Fiber Nutrition Therapy" and "High Fiber Nutrition Therapy" handout from the [employer] of [employer]. Reviewed home diet with pt and suggested ways to meet nutrition goals over the next several weeks. Explained reasons to follow Low Fiber diet for the next 6 weeks and discussed ways to achieve.  Pt states she is hopeful for surgery within the next 6 weeks and has been instructed to follow a low fiber diet until that time.  Discussed best practice for long-term management of diverticulosis is a High Fiber diet and discussed ways to increase fiber content in an appropriate time frame. Encouraged fresh fruits and vegetables as well as whole grain sources of carbohydrates to maximize fiber intake.  Encouraged fluid intake. Discussed symptom management should abdominal pain occur while on high fiber diet.  Pt verbalizes understanding of information provided.  Expect good compliance.  Body mass index is 31.04 kg/(m^2). Pt meets criteria for obese class 1 based on current BMI.  Current diet order is Regular, patient is consuming approximately 50% of meals at this time. Labs and medications reviewed. No further nutrition interventions warranted at this time. RD contact information provided. If additional nutrition issues arise, please re-consult RD.  Loyce Dys, MS RD LDN Clinical Inpatient Dietitian Pager: [phone] Weekend/After hours pager: [phone]

## 2013-02-20 NOTE — Progress Notes (Signed)
  Subjective: Pt. Had a headache overnight, alleviated with tylenol.  Her abdominal pain has greatly improved stating that she is no longer having "pulling sensation" with movements.  She is tolerating diet well.  Denies nausea or vomiting.  +flatus with small bm last night.  She is ambulating in the room without any difficulties.     Objective: Vital signs in last 24 hours: Temp:  [97.7 F (36.5 C)-98.7 F (37.1 C)] 97.9 F (36.6 C) (04/09 0502) Pulse Rate:  [64-78] 70 (04/09 0502) Resp:  [18-20] 20 (04/09 0502) BP: (130-158)/(61-88) 130/61 mmHg (04/09 0502) SpO2:  [96 %-98 %] 97 % (04/09 0502) Last BM Date: 02/20/13  Intake/Output from previous day: 04/08 0701 - 04/09 0700 In: 240 [P.O.:240] Out: 350 [Urine:350] Intake/Output this shift:    General appearance: alert, cooperative and no distress Resp: clear to auscultation bilaterally Cardio: regular rate and rhythm, S1, S2 normal, no murmur, click, rub or gallop GI: +bowe sounds, no masses, no hepatomegaly.  No tenderness particularly LLQ and LUQ Skin: Skin color, texture, turgor normal. No rashes or lesions  Lab Results:   Recent Labs  02/18/13 0657 02/20/13 0525  WBC 10.1 6.3  HGB 10.2* 10.5*  HCT 30.1* 30.9*  PLT 276 259   BMET  Recent Labs  02/18/13 0657 02/20/13 0525  NA 140 141  K 3.8 3.4*  CL 107 107  CO2 25 24  GLUCOSE 122* 110*  BUN 4* 3*  CREATININE 0.81 0.80  CALCIUM 8.7 8.7   PT/INR No results found for this basename: LABPROT, INR,  in the last 72 hours ABG No results found for this basename: PHART, PCO2, PO2, HCO3,  in the last 72 hours  Studies/Results: No results found.  Anti-infectives: Anti-infectives   Start     Dose/Rate Route Frequency Ordered Stop   02/19/13 1000  amoxicillin-clavulanate (AUGMENTIN) 875-125 MG per tablet 1 tablet     1 tablet Oral Every 12 hours 02/19/13 0834     02/19/13 0900  metroNIDAZOLE (FLAGYL) tablet 500 mg     500 mg Oral 3 times per day 02/19/13  0834     02/15/13 0600  piperacillin-tazobactam (ZOSYN) IVPB 3.375 g  Status:  Discontinued     3.375 g 12.5 mL/hr over 240 Minutes Intravenous 3 times per day 02/14/13 2233 02/19/13 0834   02/14/13 1945  piperacillin-tazobactam (ZOSYN) IVPB 3.375 g     3.375 g 12.5 mL/hr over 240 Minutes Intravenous  Once 02/14/13 1944 02/15/13 0028      Assessment/Plan: Recurrent diverticulitis (3rd episode), but appears to be in a new location (proximal descending colon)  -Conservative management, Regular diet.  She is doing much better overall.  She may likely be discharged tomorrow from surgical standpoint with Augmentin and Flagyl.  She will need to continue with antibiotics until her surgery which will be followed by the colonoscopy. -Pain is adequately controlled with oral medication. -SCD's Lovenox, ambulation off the floor okay, IS  -She'll need a colonoscopy in approximately 6 weeks pre-op.    Hypokalemia: supplement KCL POx1 now.  Obtain Magnesium level and repeat BMET in AM.     LOS: 6 days    Everline Mahaffy, [facility] 02/20/2013

## 2013-02-21 DIAGNOSIS — K219 Gastro-esophageal reflux disease without esophagitis: Secondary | ICD-10-CM

## 2013-02-21 DIAGNOSIS — J45909 Unspecified asthma, uncomplicated: Secondary | ICD-10-CM

## 2013-02-21 LAB — GLUCOSE, CAPILLARY: Glucose-Capillary: 90 mg/dL (ref 70–99)

## 2013-02-21 LAB — BASIC METABOLIC PANEL
CO2: 25 mEq/L (ref 19–32)
Glucose, Bld: 107 mg/dL — ABNORMAL HIGH (ref 70–99)
Potassium: 3.8 mEq/L (ref 3.5–5.1)
Sodium: 140 mEq/L (ref 135–145)

## 2013-02-21 MED ORDER — LEVOTHYROXINE SODIUM 200 MCG PO TABS: 200.0000 ug | ORAL_TABLET | Freq: Every day | ORAL | Status: DC

## 2013-02-21 MED ORDER — AMOXICILLIN-POT CLAVULANATE 875-125 MG PO TABS: 1.0000 | ORAL_TABLET | Freq: Two times a day (BID) | ORAL | Status: AC

## 2013-02-21 MED ORDER — METRONIDAZOLE 500 MG PO TABS: 500.0000 mg | ORAL_TABLET | Freq: Three times a day (TID) | ORAL | Status: DC

## 2013-02-21 NOTE — Discharge Summary (Signed)
Physician Discharge Summary  [name] ZOX:[id number] DOB: 20-Oct-1951 DOA: 02/14/2013  PCP: [name], MD  Admit date: 02/14/2013 Discharge date: 02/21/2013  Time spent: >30 minutes  Recommendations for Outpatient Follow-up:  TSH and Free T3-T4 in 3 months  BMET to follow electrolytes Reassess BP and adjust medications if needed Patient might required referral to psychiatry for further eval and treatment of her depression.  Discharge Diagnoses:  Principal Problem:   Diverticulitis of proximal descending colon Active Problems:   HYPOTHYROIDISM   DEPRESSION   ASTHMA   Type 2 diabetes mellitus   Morbid obesity   History of ulcerative colitis   H/O: hysterectomy   H/O diverticulitis of SIGMOID colon   Discharge Condition: stable and improved. No abd pain, nausea, vomiting or any other acute complaints. Tolerating diet and medications by mouth.  Diet recommendation: heart healthy, low carb and low residue diet  Filed Weights   02/14/13 2227  Weight: 130.2 kg (287 lb 0.6 oz)    History of present illness:  62 y.o. female with history of recent sigmoid the colitis, admitted to the hospital and was treated with antibiotic with improvement of her symptoms, returned to the emergency room as she had recurrence of her left lower quadrant pain. She denied any fever, chills, black stool bloody stool, nausea or vomiting. She stated that she has just finished Augmentin today. She sees Dr. [name] as her gastroenterologist, and had questionable history of ulcerative colitis. Her last hospitalization, it was thought that she might have ischemic colitis as well although this was not confirmed. She stated her last colonoscopy was about 5 years ago. Furthermore, she actually had seen Dr. Vick Frees last week for a consultation. Workup in emergency room included leukocytosis with a white count of 17,000. A repeat abdominal CT showed improvement of her sigmoid diverticulitis, unfortunately,  she developed no diverticulitis of the descending colon. She does have diabetes, diet controlled. She is also allergic to quinolone.   Hospital Course:  1-Diverticulitis: Third Episode, different anatomical location. Patient received Zosyn for 5 days. She received IV fluids. Surgery and GI recommendation appreciated. Colonoscopy in the outpatient settings in about 6 weeks. WBC WNL. Patient will need long course of antibiotics until she has surgery. Patient started on Augmentin and flagyl. Diet advance to low residue and was well tolerated. -will discharge home and she will follow with [facility] in 2 weeks and with GI in 4-6 weeks for colonoscopy.   2-Hypothyroidism: Continue increased synthroid at 200 mcg. TSH and Free T3-T4 in 3 months for further adjustments.   3-Diabetes: Continue SSI and low carb diet   4-Asthma: Stable. Continue PRN Nebulizer treatments.   5-Morbid Obesity: low calorie diet and exercise discussed with patient.   6-HTN: well controlled. Continue cozaar and toprol   7-Depression/anxiety: Continue Trazodone and PRN Xanax. Patient will discuss with PCP at discharge for long term therapy and if needed psychiatry referral.   8-hypokalemia: repleted.    Procedures: See below for x-ray reports  Consultations:  [facility] (Dr. Dwain Sarna)  Discharge Exam: Filed Vitals:   02/20/13 1935 02/20/13 2119 02/21/13 0513 02/21/13 0805  BP:  107/93 158/79   Pulse:  96 76   Temp:  97.9 F (36.6 C) 98.3 F (36.8 C)   TempSrc:  Oral Oral   Resp:  16 18   Height:      Weight:      SpO2: 96% 98% 95% 97%    General: NAD, afebrile; tolerating PO diet and medications without  problems Cardiovascular: S1 and S2, no rubs or gallops Respiratory: CTA bilaterally Abdomen: soft, NT, ND, obese and with positive BS Neuro: non focal deficit.  Discharge Instructions  Discharge Orders   Future Orders Complete By Expires     Diet - low sodium heart healthy  As directed     Discharge  instructions  As directed     Comments:      Keep yourself well hydrated Take medications as prescribed Follow with PCP in 7-10 days Follow with Dr. Purnell Shoemaker in 2 weeks Call GI office and schedule colonoscopy in approx 6 weeks from now.        Medication List    TAKE these medications       albuterol 108 (90 BASE) MCG/ACT inhaler  Commonly known as:  PROVENTIL HFA;VENTOLIN HFA  Inhale 2 puffs into the lungs every 6 (six) hours as needed for wheezing. For wheezing     ALPRAZolam 0.5 MG tablet  Commonly known as:  XANAX  Take 0.5 mg by mouth 2 (two) times daily as needed for sleep. For sleep/anxiety     amoxicillin-clavulanate 875-125 MG per tablet  Commonly known as:  AUGMENTIN  Take 1 tablet by mouth 2 (two) times daily. X 2 weeks     aspirin 81 MG tablet  Take 81 mg by mouth daily.     aspirin-acetaminophen-caffeine 250-250-65 MG per tablet  Commonly known as:  EXCEDRIN MIGRAINE  Take 1 tablet by mouth every 6 (six) hours as needed. For pain/headache     cetirizine 10 MG tablet  Commonly known as:  ZYRTEC  Take 10 mg by mouth at bedtime.     esomeprazole 40 MG capsule  Commonly known as:  NEXIUM  Take 40 mg by mouth daily before breakfast.     famotidine 20 MG tablet  Commonly known as:  PEPCID  Take 20 mg by mouth at bedtime.     Fluticasone-Salmeterol 250-50 MCG/DOSE Aepb  Commonly known as:  ADVAIR  Inhale 1 puff into the lungs 2 (two) times daily as needed. For seasonal asthma     furosemide 40 MG tablet  Commonly known as:  LASIX  Take 40 mg by mouth daily.     hyoscyamine 0.375 MG 12 hr tablet  Commonly known as:  LEVBID  Take 0.375 mg by mouth every morning.     levothyroxine 200 MCG tablet  Commonly known as:  SYNTHROID  Take 1 tablet (200 mcg total) by mouth daily.     losartan 50 MG tablet  Commonly known as:  COZAAR  Take 50 mg by mouth every morning.     metoprolol tartrate 25 MG tablet  Commonly known as:  LOPRESSOR  Take 25 mg by  mouth every morning.     metroNIDAZOLE 500 MG tablet  Commonly known as:  FLAGYL  Take 1 tablet (500 mg total) by mouth every 8 (eight) hours.     polyethylene glycol powder powder  Commonly known as:  GLYCOLAX/MIRALAX  Take 17 g by mouth every morning.     simvastatin 10 MG tablet  Commonly known as:  ZOCOR  Take 10 mg by mouth at bedtime.     traZODone 150 MG tablet  Commonly known as:  DESYREL  Take 150 mg by mouth at bedtime.     vitamin B-12 100 MCG tablet  Commonly known as:  CYANOCOBALAMIN  Take 100 mcg by mouth every morning.     Vitamin D 2000 UNITS tablet  Take 2,000 Units  by mouth daily.     zolpidem 10 MG tablet  Commonly known as:  AMBIEN  Take 10 mg by mouth at bedtime.     ZYFLO CR 600 MG CR tablet  Generic drug:  zileuton  Take 1,200 mg by mouth every morning.           Follow-up Information   Follow up with ROSENBOWER,TODD J, MD. Schedule an appointment as soon as possible for a visit in 2 weeks.   Contact information:   [address] [place] [address] [phone]       Follow up with [name], MD. Schedule an appointment as soon as possible for a visit in 10 days.   Contact information:   [address] [place] [address] [phone]        The results of significant diagnostics from this hospitalization (including imaging, microbiology, ancillary and laboratory) are listed below for reference.    Significant Diagnostic Studies: Ct Abdomen Pelvis W Contrast  02/14/2013  *RADIOLOGY REPORT*  Clinical Data: Left sided abdominal pain  CT ABDOMEN AND PELVIS WITH CONTRAST  Technique:  Multidetector CT imaging of the abdomen and pelvis was performed following the standard protocol during bolus administration of intravenous contrast.  Contrast: OMNIPAQUE IOHEXOL 300 MG/ML  SOLN  Comparison: 01/26/2013  Findings: Inflammatory changes of the proximal descending colon have developed associated with diverticuli.   There is stranding in the adjacent retroperitoneal fat and wall thickening.  No extraluminal bowel gas.  No evidence of perforation.  Inflammatory changes and wall thickening of the sigmoid colon have improved. Again, there is no evidence of extraluminal bowel gas or abscess formation.  Otherwise stable study.  IMPRESSION: Improved sigmoid diverticulitis.  New acute diverticulitis in the proximal descending colon.  No evidence of perforation or abscess.   Original Report Authenticated By: Jolaine Click, M.D.    Ct Abdomen Pelvis W Contrast  01/26/2013  *RADIOLOGY REPORT*  Clinical Data: 62 year old female with abdominal and pelvic pain. History of recent diverticulitis.  CT ABDOMEN AND PELVIS WITH CONTRAST  Technique:  Multidetector CT imaging of the abdomen and pelvis was performed following the standard protocol during bolus administration of intravenous contrast.  Contrast: 50 ml intravenous Omnipaque-300  Comparison: 12/04/2012 and prior CTs dating back to 06/10/2006  Findings: Mild fatty infiltration of the liver is again identified. No focal hepatic abnormalities are present. At least three gallstones are present, the largest measuring 2 cm. No CT evidence of acute cholecystitis identified. The spleen, pancreas, adrenal glands and kidneys are unremarkable except for A right renal cyst and five punctate nonobstructing mid renal calculus.  There is wall thickening inflammation of the proximal sigmoid colon compatible with diverticulitis and increased since the prior study. There is no evidence of focal abscess.  No gross evidence of pneumoperitoneum is identified.  There is no evidence of free fluid, abdominal aortic aneurysm, biliary dilatation or enlarged lymph nodes. The patient is status post hysterectomy. The bladder and appendix are unremarkable.  No acute or suspicious bony abnormalities are identified.  IMPRESSION: Moderate acute proximal sigmoid colonic diverticulitis without abscess or gross  pneumoperitoneum.  This has increased since 12/04/2012.  Cholelithiasis without CT evidence of acute cholecystitis.  Fatty infiltration of the liver.   Original Report Authenticated By: Harmon Pier, M.D.     Microbiology: Recent Results (from the past 240 hour(s))  CLOSTRIDIUM DIFFICILE BY PCR     Status: None   Collection Time    02/17/13  8:43  PM      Result Value Range Status   C difficile by pcr NEGATIVE  NEGATIVE Final     Labs: Basic Metabolic Panel:  Recent Labs Lab 02/16/13 0540 02/17/13 0640 02/18/13 0657 02/20/13 0525 02/20/13 1000 02/21/13 0640  NA 140 141 140 141  --  140  K 3.4* 3.7 3.8 3.4*  --  3.8  CL 104 107 107 107  --  107  CO2 30 29 25 24   --  25  GLUCOSE 144* 133* 122* 110*  --  107*  BUN 7 5* 4* 3*  --  3*  CREATININE 0.83 0.86 0.81 0.80  --  0.79  CALCIUM 8.6 8.7 8.7 8.7  --  8.8  MG  --   --   --   --  1.4*  --    Liver Function Tests:  Recent Labs Lab 02/14/13 1501  AST 28  ALT 26  ALKPHOS 103  BILITOT 0.5  PROT 8.2  ALBUMIN 3.7   CBC:  Recent Labs Lab 02/14/13 1501 02/15/13 0855 02/16/13 0540 02/17/13 0640 02/18/13 0657 02/20/13 0525  WBC 17.2* 13.4* 9.6 9.1 10.1 6.3  NEUTROABS 10.5*  --   --   --   --   --   HGB 14.3 12.0 10.7* 10.3* 10.2* 10.5*  HCT 41.8 36.5 31.9* 30.7* 30.1* 30.9*  MCV 82.9 84.9 83.5 84.1 83.6 83.5  PLT 351 315 240 244 276 259    Signed:  Leya Paige  [facility] 02/21/2013, 10:51 AM

## 2013-02-21 NOTE — Progress Notes (Signed)
Agree with above 

## 2013-02-21 NOTE — Progress Notes (Signed)
  Subjective: Pt. Sitting up in a chair.  She reports feeling well.  She denies abdominal pain. Ambulating in room and hallways without any difficulties.  Denies n/v.  +flatus/bm.  Pain is well controlled with tylenol.  She is anxious to go home.  Objective: Vital signs in last 24 hours: Temp:  [97.9 F (36.6 C)-98.3 F (36.8 C)] 98.3 F (36.8 C) (04/10 0513) Pulse Rate:  [76-96] 76 (04/10 0513) Resp:  [16-18] 18 (04/10 0513) BP: (107-158)/(79-93) 158/79 mmHg (04/10 0513) SpO2:  [95 %-98 %] 97 % (04/10 0805) Last BM Date: 02/20/13  Intake/Output from previous day: 04/09 0701 - 04/10 0700 In: 690 [P.O.:690] Out: 650 [Urine:650] Intake/Output this shift:    General appearance: alert, cooperative and no distress Resp: clear to auscultation bilaterally Cardio: regular rate and rhythm, S1, S2 normal, no murmur, click, rub or gallop GI: soft, non-tender; bowel sounds normal; no masses,  no organomegaly  Lab Results:   Recent Labs  02/20/13 0525  WBC 6.3  HGB 10.5*  HCT 30.9*  PLT 259   BMET  Recent Labs  02/20/13 0525 02/21/13 0640  NA 141 140  K 3.4* 3.8  CL 107 107  CO2 24 25  GLUCOSE 110* 107*  BUN 3* 3*  CREATININE 0.80 0.79  CALCIUM 8.7 8.8    Studies/Results: No results found.  Anti-infectives: Anti-infectives   Start     Dose/Rate Route Frequency Ordered Stop   02/19/13 1000  amoxicillin-clavulanate (AUGMENTIN) 875-125 MG per tablet 1 tablet     1 tablet Oral Every 12 hours 02/19/13 0834     02/19/13 0900  metroNIDAZOLE (FLAGYL) tablet 500 mg     500 mg Oral 3 times per day 02/19/13 0834     02/15/13 0600  piperacillin-tazobactam (ZOSYN) IVPB 3.375 g  Status:  Discontinued     3.375 g 12.5 mL/hr over 240 Minutes Intravenous 3 times per day 02/14/13 2233 02/19/13 0834   02/14/13 1945  piperacillin-tazobactam (ZOSYN) IVPB 3.375 g     3.375 g 12.5 mL/hr over 240 Minutes Intravenous  Once 02/14/13 1944 02/15/13 0028       Assessment/Plan: Recurrent diverticulitis (3rd episode), but appears to be in a new location (proximal descending colon)  -Tolerating regular diet.   -Dietician evaluation 02/20/13 for fiber diet. -May be discharged today from a surgical standpoint. She will need to continue with antibiotics until her surgery which will be followed by the colonoscopy in 6 weeks. -Pain is adequately controlled with oral medication.  -Follow up with Dr. Abbey Chatters in 2-3 weeks.  Hypokalemia: resolved.    LOS: 7 days    Laneta Guerin, [facility] 02/21/2013

## 2013-02-22 ENCOUNTER — Telehealth: Payer: Self-pay | Admitting: Internal Medicine

## 2013-02-22 NOTE — Telephone Encounter (Signed)
Note completed and mailed to patient.  Also faxed records to [facility] for patient's short term disability.   Paperwork filed in patient's chart with fax transmittal of 95 pages received.

## 2013-02-22 NOTE — Telephone Encounter (Signed)
Will provide note. °

## 2013-02-28 ENCOUNTER — Ambulatory Visit: Payer: 59 | Admitting: Internal Medicine

## 2013-03-18 ENCOUNTER — Other Ambulatory Visit (INDEPENDENT_AMBULATORY_CARE_PROVIDER_SITE_OTHER): Payer: Self-pay | Admitting: General Surgery

## 2013-03-18 ENCOUNTER — Telehealth: Payer: Self-pay | Admitting: Internal Medicine

## 2013-03-18 ENCOUNTER — Ambulatory Visit (INDEPENDENT_AMBULATORY_CARE_PROVIDER_SITE_OTHER): Payer: Commercial Managed Care - PPO | Admitting: General Surgery

## 2013-03-18 ENCOUNTER — Encounter (INDEPENDENT_AMBULATORY_CARE_PROVIDER_SITE_OTHER): Payer: Self-pay | Admitting: General Surgery

## 2013-03-18 VITALS — BP 114/78 | HR 80 | Temp 96.8°F | Ht 65.0 in | Wt 283.8 lb

## 2013-03-18 DIAGNOSIS — K5792 Diverticulitis of intestine, part unspecified, without perforation or abscess without bleeding: Secondary | ICD-10-CM

## 2013-03-18 DIAGNOSIS — K5732 Diverticulitis of large intestine without perforation or abscess without bleeding: Secondary | ICD-10-CM

## 2013-03-18 NOTE — Patient Instructions (Signed)
We will tentatively plan surgery in early June depending on when the colonoscopy is done and what the results of that are.

## 2013-03-18 NOTE — Telephone Encounter (Signed)
Direct should be ok  She is nontender and on Abx at this time so ok to set up direct  Need to try to get done soon

## 2013-03-18 NOTE — Progress Notes (Signed)
Patient ID: [name], female   DOB: 01-14-1951, 62 y.o.   MRN: [id number]  Chief Complaint  Patient presents with  . New Evaluation    eval diverticulitis    HPI [name] is a 62 y.o. female.   HPI  Since I saw her last, she was admitted to the hospital with an episode of new acute diverticulitis in the proximal descending colon this time. She was placed on IV antibiotics and then discharged on oral antibiotics. She is on oral Augmentin and Flagyl. She did not get to see Dr. [name] in the office but did see Dr. Russella Dar in the hospital. She's feeling a little better but still has some discomfort in the lower abdomen today. No fevers or chills. She is having loose bowel movements and watching her diet carefully.  Past Medical History  Diagnosis Date  . Migraine   . Diabetes mellitus   . Asthma   . Diverticulitis   . Diverticulosis   . Thyroid disease     hypo  . Hypertension     Past Surgical History  Procedure Laterality Date  . Cesarean section    . Abdominal hysterectomy    . Knee cartilage surgery      Left  . Cataract extraction, bilateral    . Colonoscopy      multiple   . Esophagogastroduodenoscopy endoscopy      multiple    Family History  Problem Relation Age of Onset  . Heart disease Mother   . Kidney failure Father   . Diabetes Father   . Breast cancer Paternal Aunt     Social History History  Substance Use Topics  . Smoking status: Former Games developer  . Smokeless tobacco: Not on file     Comment: 25 years ago  . Alcohol Use: No    Allergies  Allergen Reactions  . Levofloxacin Anaphylaxis    Possibly Levaquin or Lisinopril, pt was taking both at the time of reaction  . Lisinopril Anaphylaxis    Possibly Lisinopril or Levaquin, pt was taking both at the time of reaction  . Bee Venom Swelling  . Morphine And Related Itching    Needs benadryl prior to     Current Outpatient Prescriptions  Medication Sig Dispense Refill  . albuterol  (PROVENTIL HFA;VENTOLIN HFA) 108 (90 BASE) MCG/ACT inhaler Inhale 2 puffs into the lungs every 6 (six) hours as needed for wheezing. For wheezing      . ALPRAZolam (XANAX) 0.5 MG tablet Take 0.5 mg by mouth 2 (two) times daily as needed for sleep. For sleep/anxiety      . aspirin 81 MG tablet Take 81 mg by mouth daily.        Marland Kitchen aspirin-acetaminophen-caffeine (EXCEDRIN MIGRAINE) 250-250-65 MG per tablet Take 1 tablet by mouth every 6 (six) hours as needed. For pain/headache      . cetirizine (ZYRTEC) 10 MG tablet Take 10 mg by mouth at bedtime.       . Cholecalciferol (VITAMIN D) 2000 UNITS tablet Take 2,000 Units by mouth daily.        Marland Kitchen esomeprazole (NEXIUM) 40 MG capsule Take 40 mg by mouth daily before breakfast.       . famotidine (PEPCID) 20 MG tablet Take 20 mg by mouth at bedtime.        . Fluticasone-Salmeterol (ADVAIR) 250-50 MCG/DOSE AEPB Inhale 1 puff into the lungs 2 (two) times daily as needed. For seasonal asthma      . furosemide (  LASIX) 40 MG tablet Take 40 mg by mouth daily.       . hyoscyamine (LEVBID) 0.375 MG 12 hr tablet Take 0.375 mg by mouth every morning.       Marland Kitchen levothyroxine (SYNTHROID) 200 MCG tablet Take 1 tablet (200 mcg total) by mouth daily.  30 tablet  1  . losartan (COZAAR) 50 MG tablet Take 50 mg by mouth every morning.       . metoprolol tartrate (LOPRESSOR) 25 MG tablet Take 25 mg by mouth every morning.       . metroNIDAZOLE (FLAGYL) 500 MG tablet Take 1 tablet (500 mg total) by mouth every 8 (eight) hours.  75 tablet  1  . polyethylene glycol powder (GLYCOLAX/MIRALAX) powder Take 17 g by mouth every morning.      . simvastatin (ZOCOR) 10 MG tablet Take 10 mg by mouth at bedtime.      . traZODone (DESYREL) 150 MG tablet Take 150 mg by mouth at bedtime.      . vitamin B-12 (CYANOCOBALAMIN) 100 MCG tablet Take 100 mcg by mouth every morning.       . zileuton (ZYFLO CR) 600 MG CR tablet Take 1,200 mg by mouth every morning.       . zolpidem (AMBIEN) 10 MG tablet  Take 10 mg by mouth at bedtime.       No current facility-administered medications for this visit.    Review of Systems Review of Systems  Constitutional: Negative for fever and chills.  Gastrointestinal: Positive for abdominal pain.    Blood pressure 114/78, pulse 80, temperature 96.8 F (36 C), temperature source Temporal, height 5\' 5"  (1.651 m), weight 283 lb 12.8 oz (128.731 kg), SpO2 97.00%.  Physical Exam Physical Exam  Constitutional: No distress.  obese  HENT:  Head: Normocephalic and atraumatic.  Abdominal: Soft. She exhibits no distension and no mass. There is no tenderness. There is no guarding.    Data Reviewed Recent hospital notes. CT scan.  Assessment    Recurring episodes of left-sided diverticulitis. This has involved the sigmoid colon, distal descending colon, and now proximal descending colon. Recent colonoscopy has not been able to be performed because of these episodes.     Plan    Continue oral antibiotics until the time of surgery. Refer back to Dr. [name] for colonoscopy.  We did talk about partial colectomy which could be a left colectomy at this time versus subtotal colectomy. We talked about the side effects of both including increased stool frequency.  I  explained the procedure and risks of colon resection.  Risks include but are not limited to bleeding, infection, wound problems, anesthesia, anastomotic leak, need for colostomy, injury to intraabominal organs (such as intestine, spleen, kidney, bladder, ureter, etc.), ileus, irregular bowel habits.  She seems to understand. Fully, we can get a colonoscopy done and then scheduled the surgery for sometime in early June. If she ends up having to be hospitalized again, she may just need the operation at that time.       Taaj Hurlbut J 03/18/2013, 4:46 PM

## 2013-03-18 NOTE — Telephone Encounter (Signed)
Dr. Abbey Chatters is requesting a colonoscopy.  Should she have office visit?  Please see flex from 2011 and CT findings from 03/13/13

## 2013-03-19 ENCOUNTER — Encounter: Payer: Self-pay | Admitting: Internal Medicine

## 2013-03-19 ENCOUNTER — Telehealth (INDEPENDENT_AMBULATORY_CARE_PROVIDER_SITE_OTHER): Payer: Self-pay

## 2013-03-19 NOTE — Telephone Encounter (Signed)
Pt has consult appt for colonoscopy 04/23/13 at 11:am. Procedure 05/02/13, 10:30am arrival, 11am colonoscopy.  They will send her information.

## 2013-03-19 NOTE — Telephone Encounter (Signed)
I spoke with [name] she is scheduled for pre-visit 04/23/13 11:00 and colon 05/02/13 11:30

## 2013-03-20 ENCOUNTER — Telehealth: Payer: Self-pay

## 2013-03-20 NOTE — Telephone Encounter (Signed)
Message copied by Annett Fabian on Wed Mar 20, 2013 11:16 AM ------      Message from: Stan Head E      Created: Wed Mar 20, 2013  7:18 AM       I noticed she is on for a colonoscopy in mid June      What do we/I need to do to make this happen in May?            Thanks      ----- Message -----         From: Adolph Pollack, MD         Sent: 03/20/2013   6:29 AM           To: Iva Boop, MD            [facility]  Thanks.      ----- Message -----         From: Iva Boop, MD         Sent: 03/19/2013   2:07 PM           To: Adolph Pollack, MD            We are working on getting her in for a colonoscopy                  ----- Message -----         From: Adolph Pollack, MD         Sent: 03/18/2013   4:51 PM           To: Iva Boop, MD            Darrel Hoover,            She had another episode of diverticulitis, this time in the proximal descending colon. She is currently on persistent antibiotics. I was hoping that she could get a colonoscopy maybe later this month and then I could schedule her surgery for early June. She needs another appointment to see you if your staff could arrange for that please.            Thx,            TR                   ------

## 2013-03-20 NOTE — Telephone Encounter (Signed)
Patient is rescheduled for 03/25/13 pre-visit 3:30 and colon 03/28/13 1:30

## 2013-03-25 ENCOUNTER — Ambulatory Visit (AMBULATORY_SURGERY_CENTER): Payer: 59 | Admitting: *Deleted

## 2013-03-25 VITALS — Ht 65.0 in | Wt 282.8 lb

## 2013-03-25 DIAGNOSIS — K5732 Diverticulitis of large intestine without perforation or abscess without bleeding: Secondary | ICD-10-CM

## 2013-03-25 MED ORDER — NA SULFATE-K SULFATE-MG SULF 17.5-3.13-1.6 GM/177ML PO SOLN: ORAL | Status: DC

## 2013-03-26 ENCOUNTER — Other Ambulatory Visit: Payer: Self-pay

## 2013-03-26 MED ORDER — ESCITALOPRAM OXALATE 10 MG PO TABS: 10.0000 mg | ORAL_TABLET | Freq: Every day | ORAL | Status: DC

## 2013-03-28 ENCOUNTER — Ambulatory Visit (AMBULATORY_SURGERY_CENTER): Payer: 59 | Admitting: Internal Medicine

## 2013-03-28 ENCOUNTER — Encounter: Payer: Self-pay | Admitting: Internal Medicine

## 2013-03-28 VITALS — BP 132/74 | HR 78 | Temp 98.9°F | Resp 23 | Ht 65.0 in | Wt 282.0 lb

## 2013-03-28 DIAGNOSIS — K5730 Diverticulosis of large intestine without perforation or abscess without bleeding: Secondary | ICD-10-CM

## 2013-03-28 DIAGNOSIS — K5732 Diverticulitis of large intestine without perforation or abscess without bleeding: Secondary | ICD-10-CM

## 2013-03-28 MED ORDER — SODIUM CHLORIDE 0.9 % IV SOLN: 500.0000 mL | INTRAVENOUS | Status: DC

## 2013-03-28 NOTE — Op Note (Signed)
[facility] [address] [place], [address]   COLONOSCOPY PROCEDURE REPORT  PATIENT: Tammy, [name]  MR#: [id number] BIRTHDATE: 1951-01-25 , 61  yrs. old GENDER: Female ENDOSCOPIST: Iva Boop, MD, [facility] PROCEDURE DATE:  03/28/2013 PROCEDURE:   Colonoscopy, diagnostic ASA CLASS:   Class III INDICATIONS:recent diverticulitis, awaiting surgery. MEDICATIONS: propofol (Diprivan) 150mg  IV  DESCRIPTION OF PROCEDURE:   After the risks benefits and alternatives of the procedure were thoroughly explained, informed consent was obtained.  A digital rectal exam revealed no abnormalities of the rectum.   The LB [id number]  endoscope was introduced through the anus and advanced to the cecum, which was identified by both the appendix and ileocecal valve. No adverse events experienced.   The quality of the prep was excellent using Suprep  The instrument was then slowly withdrawn as the colon was fully examined.      COLON FINDINGS: There was severe diverticulosis noted in the sigmoid colon with associated luminal narrowing and muscular hypertrophy, some edema but no diverticulitis seen.   The colon mucosa was otherwise normal.  Retroflexed views revealed no abnormalities. The time to cecum=2 minutes 04 seconds.  Withdrawal time=7 minutes 02 seconds.  The scope was withdrawn and the procedure completed. COMPLICATIONS: There were no complications.  ENDOSCOPIC IMPRESSION: 1.   There was severe diverticulosis noted in the sigmoid colon 2.   Normal colonoscopy otherwise  RECOMMENDATIONS: Prcoeed w/ surgery Routine repeat colonoscopy 2024   eSigned:  Iva Boop, MD, [facility] 03/28/2013 2:01 PM   cc: Avel Peace, MD and The Patient

## 2013-03-28 NOTE — Progress Notes (Signed)
Lidocaine-40mg IV prior to Propofol InductionPropofol given over incremental dosages 

## 2013-03-28 NOTE — Progress Notes (Signed)
Patient did not experience any of the following events: a burn prior to discharge; a fall within the facility; wrong site/side/patient/procedure/implant event; or a hospital transfer or hospital admission upon discharge from the facility. (G8907) Patient did not have preoperative order for IV antibiotic SSI prophylaxis. (G8918)  

## 2013-03-28 NOTE — Patient Instructions (Addendum)
I saw diverticulosis but no diverticulitis.  No polyps or cancer seen.  Next routine colonoscopy 2024  Good luck with surgery.  I appreciate the opportunity to care for you.  Iva Boop, MD, FACG  YOU HAD AN ENDOSCOPIC PROCEDURE TODAY AT THE [facility]: Refer to the procedure report that was given to you for any specific questions about what was found during the examination.  If the procedure report does not answer your questions, please call your gastroenterologist to clarify.  If you requested that your care partner not be given the details of your procedure findings, then the procedure report has been included in a sealed envelope for you to review at your convenience later.  YOU SHOULD EXPECT: Some feelings of bloating in the abdomen. Passage of more gas than usual.  Walking can help get rid of the air that was put into your GI tract during the procedure and reduce the bloating. If you had a lower endoscopy (such as a colonoscopy or flexible sigmoidoscopy) you may notice spotting of blood in your stool or on the toilet paper. If you underwent a bowel prep for your procedure, then you may not have a normal bowel movement for a few days.  DIET: Your first meal following the procedure should be a light meal and then it is ok to progress to your normal diet.  A half-sandwich or bowl of soup is an example of a good first meal.  Heavy or fried foods are harder to digest and may make you feel nauseous or bloated.  Likewise meals heavy in dairy and vegetables can cause extra gas to form and this can also increase the bloating.  Drink plenty of fluids but you should avoid alcoholic beverages for 24 hours.  ACTIVITY: Your care partner should take you home directly after the procedure.  You should plan to take it easy, moving slowly for the rest of the day.  You can resume normal activity the day after the procedure however you should NOT DRIVE or use heavy machinery for 24 hours  (because of the sedation medicines used during the test).    SYMPTOMS TO REPORT IMMEDIATELY: A gastroenterologist can be reached at any hour.  During normal business hours, 8:30 AM to 5:00 PM Monday through Friday, call [phone].  After hours and on weekends, please call the GI answering service at [phone] who will take a message and have the physician on call contact you.   Following lower endoscopy (colonoscopy or flexible sigmoidoscopy):  Excessive amounts of blood in the stool  Significant tenderness or worsening of abdominal pains  Swelling of the abdomen that is new, acute  Fever of 100F or higher  FOLLOW UP: If any biopsies were taken you will be contacted by phone or by letter within the next 1-3 weeks.  Call your gastroenterologist if you have not heard about the biopsies in 3 weeks.  Our staff will call the home number listed on your records the next business day following your procedure to check on you and address any questions or concerns that you may have at that time regarding the information given to you following your procedure. This is a courtesy call and so if there is no answer at the home number and we have not heard from you through the emergency physician on call, we will assume that you have returned to your regular daily activities without incident.  SIGNATURES/CONFIDENTIALITY: You and/or your care partner have signed paperwork which will  be entered into your electronic medical record.  These signatures attest to the fact that that the information above on your After Visit Summary has been reviewed and is understood.  Full responsibility of the confidentiality of this discharge information lies with you and/or your care-partner.

## 2013-03-29 ENCOUNTER — Telehealth: Payer: Self-pay

## 2013-03-29 ENCOUNTER — Other Ambulatory Visit (INDEPENDENT_AMBULATORY_CARE_PROVIDER_SITE_OTHER): Payer: Self-pay | Admitting: General Surgery

## 2013-03-29 NOTE — Telephone Encounter (Signed)
  Follow up Call-  Call back number 03/28/2013  Post procedure Call Back phone  # [phone]     Patient questions:  Do you have a fever, pain , or abdominal swelling? no Pain Score  0 *  Have you tolerated food without any problems? yes  Have you been able to return to your normal activities? yes  Do you have any questions about your discharge instructions: Diet   no Medications  no Follow up visit  no  Do you have questions or concerns about your Care? no  Actions: * If pain score is 4 or above: No action needed, pain <4.

## 2013-03-30 ENCOUNTER — Encounter (INDEPENDENT_AMBULATORY_CARE_PROVIDER_SITE_OTHER): Payer: Self-pay | Admitting: General Surgery

## 2013-03-30 NOTE — Progress Notes (Unsigned)
Patient ID: [name], female   DOB: June 24, 1951, 62 y.o.   MRN: [id number] Colonoscopy demonstrates severe sigmoid diverticulosis but no active diverticulitis.  Discussed this with her.  We schedule her surgery for later this month or early next month.

## 2013-04-01 ENCOUNTER — Telehealth (INDEPENDENT_AMBULATORY_CARE_PROVIDER_SITE_OTHER): Payer: Self-pay | Admitting: *Deleted

## 2013-04-01 NOTE — Telephone Encounter (Signed)
Patient called to ask about her bowel prep prior to her surgery.

## 2013-04-01 NOTE — Telephone Encounter (Signed)
Pt notified bowel prep instructions placed in the mail to her this morning.  Also instructed to drop her FMLA and STD paperwork at [employer].

## 2013-04-02 NOTE — Telephone Encounter (Signed)
Noted! Thank you

## 2013-04-09 ENCOUNTER — Telehealth (INDEPENDENT_AMBULATORY_CARE_PROVIDER_SITE_OTHER): Payer: Self-pay | Admitting: General Surgery

## 2013-04-09 ENCOUNTER — Encounter (INDEPENDENT_AMBULATORY_CARE_PROVIDER_SITE_OTHER): Payer: Self-pay

## 2013-04-09 ENCOUNTER — Encounter (HOSPITAL_COMMUNITY): Payer: Self-pay | Admitting: Pharmacy Technician

## 2013-04-09 ENCOUNTER — Telehealth (INDEPENDENT_AMBULATORY_CARE_PROVIDER_SITE_OTHER): Payer: Self-pay

## 2013-04-09 DIAGNOSIS — K5730 Diverticulosis of large intestine without perforation or abscess without bleeding: Secondary | ICD-10-CM

## 2013-04-09 NOTE — Telephone Encounter (Signed)
Please refill as needed.  She needs to keep taking the antibiotics up to the date of her surgery.

## 2013-04-09 NOTE — Telephone Encounter (Signed)
Patient calling - she is scheduled for a colectomy on 04/22/2013 and has been on flagyl and Augmentin since January. She states she will run out of the Augmentin about one week prior to surgery. Please advise if we can refill this prescription and if you want her to continue both of these until surgery. [phone]. Patient uses [facility].

## 2013-04-09 NOTE — Telephone Encounter (Signed)
LM pt's refills for Flagyl and Augmentin sent electronically to [facility].

## 2013-04-10 ENCOUNTER — Telehealth (INDEPENDENT_AMBULATORY_CARE_PROVIDER_SITE_OTHER): Payer: Self-pay | Admitting: General Surgery

## 2013-04-10 NOTE — Telephone Encounter (Signed)
[name] with [facility] called to state patient is at the pharmacy to pick up her Augmentin and they did not get the RX. I saw where RX was sent electronically to pharmacy. I gave her a verbal order for Augmentin 875 mg 1 po bid #28. To call with any other questions.

## 2013-04-15 ENCOUNTER — Encounter (HOSPITAL_COMMUNITY): Payer: Self-pay

## 2013-04-15 ENCOUNTER — Ambulatory Visit (HOSPITAL_COMMUNITY)
Admission: RE | Admit: 2013-04-15 | Discharge: 2013-04-15 | Disposition: A | Discharge status: Home / Self Care | Payer: 59 | Source: Ambulatory Visit | Attending: General Surgery | Admitting: General Surgery

## 2013-04-15 ENCOUNTER — Encounter (HOSPITAL_COMMUNITY)
Admission: RE | Admit: 2013-04-15 | Discharge: 2013-04-15 | Disposition: A | Discharge status: Home / Self Care | Payer: 59 | Source: Ambulatory Visit | Attending: General Surgery | Admitting: General Surgery

## 2013-04-15 ENCOUNTER — Other Ambulatory Visit: Payer: Self-pay | Admitting: Internal Medicine

## 2013-04-15 ENCOUNTER — Other Ambulatory Visit (HOSPITAL_COMMUNITY): Payer: Self-pay | Admitting: General Surgery

## 2013-04-15 DIAGNOSIS — I10 Essential (primary) hypertension: Secondary | ICD-10-CM | POA: Insufficient documentation

## 2013-04-15 DIAGNOSIS — Z01810 Encounter for preprocedural cardiovascular examination: Secondary | ICD-10-CM | POA: Insufficient documentation

## 2013-04-15 DIAGNOSIS — J45909 Unspecified asthma, uncomplicated: Secondary | ICD-10-CM | POA: Insufficient documentation

## 2013-04-15 DIAGNOSIS — Z01818 Encounter for other preprocedural examination: Secondary | ICD-10-CM | POA: Insufficient documentation

## 2013-04-15 DIAGNOSIS — Z01812 Encounter for preprocedural laboratory examination: Secondary | ICD-10-CM | POA: Insufficient documentation

## 2013-04-15 HISTORY — DX: Anemia, unspecified: D64.9

## 2013-04-15 HISTORY — DX: Depression, unspecified: F32.A

## 2013-04-15 HISTORY — DX: Hypothyroidism, unspecified: E03.9

## 2013-04-15 HISTORY — DX: Major depressive disorder, single episode, unspecified: F32.9

## 2013-04-15 LAB — COMPREHENSIVE METABOLIC PANEL
ALT: 20 U/L (ref 0–35)
AST: 23 U/L (ref 0–37)
Albumin: 3.2 g/dL — ABNORMAL LOW (ref 3.5–5.2)
Alkaline Phosphatase: 77 U/L (ref 39–117)
Potassium: 4 mEq/L (ref 3.5–5.1)
Sodium: 140 mEq/L (ref 135–145)
Total Protein: 6.7 g/dL (ref 6.0–8.3)

## 2013-04-15 LAB — CBC WITH DIFFERENTIAL/PLATELET
Basophils Relative: 1 % (ref 0–1)
Eosinophils Absolute: 0.4 10*3/uL (ref 0.0–0.7)
MCH: 28 pg (ref 26.0–34.0)
MCHC: 32.4 g/dL (ref 30.0–36.0)
Neutrophils Relative %: 42 % — ABNORMAL LOW (ref 43–77)
Platelets: 305 10*3/uL (ref 150–400)
RBC: 4.53 MIL/uL (ref 3.87–5.11)

## 2013-04-15 LAB — SURGICAL PCR SCREEN
MRSA, PCR: NEGATIVE
Staphylococcus aureus: NEGATIVE

## 2013-04-15 NOTE — Patient Instructions (Signed)
20 [name]  04/15/2013   Your procedure is scheduled on: 04-22-13  Report to [facility] at 830 AM.  Call this number if you have problems the morning of surgery [phone]   Remember:   Do not eat food or drink liquids :After Midnight.     Take these medicines the morning of surgery with A SIP OF WATER: albuterol inhaler if needed and bring and leave inhaler with your sister, xanax if needed, nexium, synthroid, metorpol tartrate                                SEE [facility] PREPARING FOR SURGERY SHEET   Do not wear jewelry, make-up or nail polish.  Do not wear lotions, powders, or perfumes. You may wear deodorant.   Men may shave face and neck.  Do not bring valuables to the hospital.  Contacts, dentures or bridgework may not be worn into surgery.  Leave suitcase in the car. After surgery it may be brought to your room.  For patients admitted to the hospital, checkout time is 11:00 AM the day of discharge.   Patients discharged the day of surgery will not be allowed to drive home.  Name and phone number of your driver:  Special Instructions: N/A   Please read over the following fact sheets that you were given: MRSA Information.  Call Cain Sieve RN pre op nurse if needed [phone]    FAILURE TO FOLLOW THESE INSTRUCTIONS MAY RESULT IN THE CANCELLATION OF YOUR SURGERY. PATIENT SIGNATURE___________________________________________

## 2013-04-15 NOTE — Telephone Encounter (Signed)
Please give #[phone] with one refill

## 2013-04-15 NOTE — Progress Notes (Signed)
This encounter was created in error - please disregard.

## 2013-04-22 ENCOUNTER — Inpatient Hospital Stay (HOSPITAL_COMMUNITY)
Admission: RE | Admit: 2013-04-22 | Discharge: 2013-05-01 | DRG: 330 | Disposition: A | Discharge status: Home / Self Care | Payer: 59 | Source: Ambulatory Visit | Attending: General Surgery | Admitting: General Surgery

## 2013-04-22 ENCOUNTER — Encounter (HOSPITAL_COMMUNITY): Payer: Self-pay | Admitting: Certified Registered Nurse Anesthetist

## 2013-04-22 ENCOUNTER — Encounter (HOSPITAL_COMMUNITY): Payer: Self-pay | Admitting: Anesthesiology

## 2013-04-22 ENCOUNTER — Ambulatory Visit (HOSPITAL_COMMUNITY): Payer: 59 | Admitting: Anesthesiology

## 2013-04-22 ENCOUNTER — Encounter (HOSPITAL_COMMUNITY)
Admission: RE | Disposition: A | Discharge status: Home / Self Care | Payer: Self-pay | Source: Ambulatory Visit | Attending: General Surgery

## 2013-04-22 DIAGNOSIS — L0390 Cellulitis, unspecified: Secondary | ICD-10-CM | POA: Diagnosis not present

## 2013-04-22 DIAGNOSIS — E119 Type 2 diabetes mellitus without complications: Secondary | ICD-10-CM | POA: Diagnosis present

## 2013-04-22 DIAGNOSIS — K5732 Diverticulitis of large intestine without perforation or abscess without bleeding: Principal | ICD-10-CM | POA: Diagnosis present

## 2013-04-22 DIAGNOSIS — L0291 Cutaneous abscess, unspecified: Secondary | ICD-10-CM | POA: Diagnosis not present

## 2013-04-22 DIAGNOSIS — E7849 Other hyperlipidemia: Secondary | ICD-10-CM | POA: Diagnosis present

## 2013-04-22 DIAGNOSIS — Y836 Removal of other organ (partial) (total) as the cause of abnormal reaction of the patient, or of later complication, without mention of misadventure at the time of the procedure: Secondary | ICD-10-CM | POA: Diagnosis not present

## 2013-04-22 DIAGNOSIS — J45909 Unspecified asthma, uncomplicated: Secondary | ICD-10-CM | POA: Diagnosis present

## 2013-04-22 DIAGNOSIS — T8140XA Infection following a procedure, unspecified, initial encounter: Secondary | ICD-10-CM | POA: Diagnosis not present

## 2013-04-22 DIAGNOSIS — Z7982 Long term (current) use of aspirin: Secondary | ICD-10-CM

## 2013-04-22 DIAGNOSIS — Z6841 Body Mass Index (BMI) 40.0 and over, adult: Secondary | ICD-10-CM

## 2013-04-22 DIAGNOSIS — I10 Essential (primary) hypertension: Secondary | ICD-10-CM | POA: Diagnosis present

## 2013-04-22 DIAGNOSIS — F329 Major depressive disorder, single episode, unspecified: Secondary | ICD-10-CM | POA: Diagnosis present

## 2013-04-22 DIAGNOSIS — E039 Hypothyroidism, unspecified: Secondary | ICD-10-CM | POA: Diagnosis present

## 2013-04-22 DIAGNOSIS — F32A Depression, unspecified: Secondary | ICD-10-CM | POA: Diagnosis present

## 2013-04-22 DIAGNOSIS — Z87891 Personal history of nicotine dependence: Secondary | ICD-10-CM

## 2013-04-22 DIAGNOSIS — K5730 Diverticulosis of large intestine without perforation or abscess without bleeding: Secondary | ICD-10-CM

## 2013-04-22 DIAGNOSIS — Z8719 Personal history of other diseases of the digestive system: Secondary | ICD-10-CM

## 2013-04-22 DIAGNOSIS — Y9210 Unspecified residential institution as the place of occurrence of the external cause: Secondary | ICD-10-CM | POA: Diagnosis not present

## 2013-04-22 DIAGNOSIS — D62 Acute posthemorrhagic anemia: Secondary | ICD-10-CM | POA: Diagnosis not present

## 2013-04-22 DIAGNOSIS — F3289 Other specified depressive episodes: Secondary | ICD-10-CM | POA: Diagnosis present

## 2013-04-22 DIAGNOSIS — Z79899 Other long term (current) drug therapy: Secondary | ICD-10-CM

## 2013-04-22 DIAGNOSIS — E785 Hyperlipidemia, unspecified: Secondary | ICD-10-CM | POA: Diagnosis present

## 2013-04-22 HISTORY — PX: LAPAROSCOPIC PARTIAL COLECTOMY: SHX5907

## 2013-04-22 LAB — GLUCOSE, CAPILLARY
Glucose-Capillary: 106 mg/dL — ABNORMAL HIGH (ref 70–99)
Glucose-Capillary: 142 mg/dL — ABNORMAL HIGH (ref 70–99)
Glucose-Capillary: 148 mg/dL — ABNORMAL HIGH (ref 70–99)

## 2013-04-22 LAB — TYPE AND SCREEN: Antibody Screen: NEGATIVE

## 2013-04-22 LAB — ABO/RH: ABO/RH(D): A POS

## 2013-04-22 SURGERY — LAPAROSCOPIC PARTIAL COLECTOMY
Anesthesia: General | Site: Abdomen | Wound class: Clean Contaminated

## 2013-04-22 MED ORDER — BUPIVACAINE-EPINEPHRINE PF 0.25-1:200000 % IJ SOLN
INTRAMUSCULAR | Status: AC
  Filled 2013-04-22: qty 30

## 2013-04-22 MED ORDER — ONDANSETRON HCL 4 MG/2ML IJ SOLN: 4.0000 mg | Freq: Four times a day (QID) | INTRAMUSCULAR | Status: DC | PRN

## 2013-04-22 MED ORDER — LACTATED RINGERS IV SOLN
INTRAVENOUS | Status: DC | PRN
  Administered 2013-04-22 (×4): via INTRAVENOUS

## 2013-04-22 MED ORDER — FENTANYL CITRATE 0.05 MG/ML IJ SOLN
INTRAMUSCULAR | Status: AC
  Filled 2013-04-22: qty 2

## 2013-04-22 MED ORDER — HEPARIN SODIUM (PORCINE) 5000 UNIT/ML IJ SOLN
5000.0000 [IU] | Freq: Three times a day (TID) | INTRAMUSCULAR | Status: DC
  Administered 2013-04-23 – 2013-04-25 (×6): 5000 [IU] via SUBCUTANEOUS
  Filled 2013-04-22 (×10): qty 1

## 2013-04-22 MED ORDER — LACTATED RINGERS IV SOLN: INTRAVENOUS | Status: DC

## 2013-04-22 MED ORDER — ALVIMOPAN 12 MG PO CAPS
12.0000 mg | ORAL_CAPSULE | Freq: Two times a day (BID) | ORAL | Status: AC
  Administered 2013-04-23 – 2013-04-24 (×3): 12 mg via ORAL
  Filled 2013-04-22 (×4): qty 1

## 2013-04-22 MED ORDER — DEXTROSE 5 % IV SOLN
2.0000 g | Freq: Four times a day (QID) | INTRAVENOUS | Status: AC
  Administered 2013-04-23: 2 g via INTRAVENOUS
  Filled 2013-04-22 (×2): qty 2

## 2013-04-22 MED ORDER — SUCCINYLCHOLINE CHLORIDE 20 MG/ML IJ SOLN
INTRAMUSCULAR | Status: DC | PRN
  Administered 2013-04-22: 100 mg via INTRAVENOUS

## 2013-04-22 MED ORDER — ALBUTEROL SULFATE HFA 108 (90 BASE) MCG/ACT IN AERS
2.0000 | INHALATION_SPRAY | Freq: Four times a day (QID) | RESPIRATORY_TRACT | Status: DC | PRN
  Filled 2013-04-22: qty 6.7

## 2013-04-22 MED ORDER — NALOXONE HCL 0.4 MG/ML IJ SOLN: 0.4000 mg | INTRAMUSCULAR | Status: DC | PRN

## 2013-04-22 MED ORDER — LEVOTHYROXINE SODIUM 200 MCG PO TABS
200.0000 ug | ORAL_TABLET | Freq: Every day | ORAL | Status: DC
  Administered 2013-04-23 – 2013-05-01 (×9): 200 ug via ORAL
  Filled 2013-04-22 (×12): qty 1

## 2013-04-22 MED ORDER — CEFOXITIN SODIUM-DEXTROSE 1-4 GM-% IV SOLR (PREMIX)
INTRAVENOUS | Status: AC
  Filled 2013-04-22: qty 100

## 2013-04-22 MED ORDER — FENTANYL CITRATE 0.05 MG/ML IJ SOLN
INTRAMUSCULAR | Status: DC | PRN
  Administered 2013-04-22 (×2): 50 ug via INTRAVENOUS
  Administered 2013-04-22: 100 ug via INTRAVENOUS
  Administered 2013-04-22 (×5): 50 ug via INTRAVENOUS

## 2013-04-22 MED ORDER — CISATRACURIUM BESYLATE (PF) 10 MG/5ML IV SOLN
INTRAVENOUS | Status: DC | PRN
  Administered 2013-04-22 (×4): 2 mg via INTRAVENOUS
  Administered 2013-04-22 (×2): 4 mg via INTRAVENOUS
  Administered 2013-04-22: 6 mg via INTRAVENOUS
  Administered 2013-04-22: 2 mg via INTRAVENOUS

## 2013-04-22 MED ORDER — DEXTROSE 5 % IV SOLN
2.0000 g | INTRAVENOUS | Status: AC
  Administered 2013-04-22: 2 g via INTRAVENOUS
  Filled 2013-04-22: qty 2

## 2013-04-22 MED ORDER — HYDROMORPHONE 0.3 MG/ML IV SOLN
INTRAVENOUS | Status: AC
  Administered 2013-04-22: 0.2 mg
  Filled 2013-04-22: qty 25

## 2013-04-22 MED ORDER — PROPOFOL 10 MG/ML IV BOLUS
INTRAVENOUS | Status: DC | PRN
  Administered 2013-04-22: 200 mg via INTRAVENOUS

## 2013-04-22 MED ORDER — ALVIMOPAN 12 MG PO CAPS
12.0000 mg | ORAL_CAPSULE | Freq: Once | ORAL | Status: AC
  Administered 2013-04-22: 12 mg via ORAL
  Filled 2013-04-22: qty 1

## 2013-04-22 MED ORDER — INSULIN ASPART 100 UNIT/ML ~~LOC~~ SOLN
0.0000 [IU] | SUBCUTANEOUS | Status: DC
  Administered 2013-04-22 – 2013-04-25 (×11): 2 [IU] via SUBCUTANEOUS

## 2013-04-22 MED ORDER — DEXTROSE IN LACTATED RINGERS 5 % IV SOLN
INTRAVENOUS | Status: DC
  Administered 2013-04-22: 17:00:00 via INTRAVENOUS
  Administered 2013-04-22: 150 mL via INTRAVENOUS
  Administered 2013-04-23 – 2013-04-24 (×5): via INTRAVENOUS

## 2013-04-22 MED ORDER — LIDOCAINE HCL 1 % IJ SOLN
INTRAMUSCULAR | Status: DC | PRN
  Administered 2013-04-22: 80 mg via INTRADERMAL

## 2013-04-22 MED ORDER — PROMETHAZINE HCL 25 MG/ML IJ SOLN: 6.2500 mg | INTRAMUSCULAR | Status: DC | PRN

## 2013-04-22 MED ORDER — HYDROMORPHONE HCL PF 1 MG/ML IJ SOLN
INTRAMUSCULAR | Status: AC
  Administered 2013-04-22: 1 mg
  Filled 2013-04-22: qty 1

## 2013-04-22 MED ORDER — HYDROMORPHONE 0.3 MG/ML IV SOLN
INTRAVENOUS | Status: DC
  Administered 2013-04-22: 0.599 mg via INTRAVENOUS
  Administered 2013-04-22: 1.39 mg via INTRAVENOUS
  Administered 2013-04-22: 0.2 mg via INTRAVENOUS
  Administered 2013-04-23: 18:00:00 via INTRAVENOUS
  Administered 2013-04-23: 0.599 mg via INTRAVENOUS
  Administered 2013-04-23: 0.999 mg via INTRAVENOUS
  Administered 2013-04-23: 0.6 mg via INTRAVENOUS
  Administered 2013-04-23 (×2): 0.59 mg via INTRAVENOUS
  Administered 2013-04-23: 0.2 mg via INTRAVENOUS
  Administered 2013-04-24: 0.8 mg via INTRAVENOUS
  Administered 2013-04-24: 0.199 mg via INTRAVENOUS
  Administered 2013-04-24: 0.599 mg via INTRAVENOUS
  Filled 2013-04-22: qty 25

## 2013-04-22 MED ORDER — ONDANSETRON HCL 4 MG PO TABS: 4.0000 mg | ORAL_TABLET | Freq: Four times a day (QID) | ORAL | Status: DC | PRN

## 2013-04-22 MED ORDER — SODIUM CHLORIDE 0.9 % IJ SOLN: 9.0000 mL | INTRAMUSCULAR | Status: DC | PRN

## 2013-04-22 MED ORDER — LACTATED RINGERS IV SOLN
INTRAVENOUS | Status: DC
  Administered 2013-04-22: 1000 mL via INTRAVENOUS

## 2013-04-22 MED ORDER — METOPROLOL TARTRATE 25 MG PO TABS
25.0000 mg | ORAL_TABLET | Freq: Every morning | ORAL | Status: DC
  Filled 2013-04-22: qty 1

## 2013-04-22 MED ORDER — NEOSTIGMINE METHYLSULFATE 1 MG/ML IJ SOLN
INTRAMUSCULAR | Status: DC | PRN
  Administered 2013-04-22: 3 mg via INTRAVENOUS

## 2013-04-22 MED ORDER — KETOROLAC TROMETHAMINE 30 MG/ML IJ SOLN
INTRAMUSCULAR | Status: DC | PRN
  Administered 2013-04-22: 30 mg via INTRAVENOUS

## 2013-04-22 MED ORDER — LABETALOL HCL 5 MG/ML IV SOLN
INTRAVENOUS | Status: DC | PRN
  Administered 2013-04-22 (×3): 5 mg via INTRAVENOUS

## 2013-04-22 MED ORDER — DIPHENHYDRAMINE HCL 12.5 MG/5ML PO ELIX
12.5000 mg | ORAL_SOLUTION | Freq: Four times a day (QID) | ORAL | Status: DC | PRN
  Administered 2013-04-22 – 2013-04-23 (×3): 12.5 mg via ORAL
  Filled 2013-04-22 (×4): qty 5

## 2013-04-22 MED ORDER — GLYCOPYRROLATE 0.2 MG/ML IJ SOLN
INTRAMUSCULAR | Status: DC | PRN
  Administered 2013-04-22: .4 mg via INTRAVENOUS

## 2013-04-22 MED ORDER — MEPERIDINE HCL 50 MG/ML IJ SOLN: 6.2500 mg | INTRAMUSCULAR | Status: DC | PRN

## 2013-04-22 MED ORDER — BUPIVACAINE-EPINEPHRINE 0.25% -1:200000 IJ SOLN
INTRAMUSCULAR | Status: DC | PRN
  Administered 2013-04-22: 16 mL

## 2013-04-22 MED ORDER — DIPHENHYDRAMINE HCL 50 MG/ML IJ SOLN
12.5000 mg | Freq: Four times a day (QID) | INTRAMUSCULAR | Status: DC | PRN
  Administered 2013-04-23: 12.5 mg via INTRAVENOUS
  Filled 2013-04-22: qty 1

## 2013-04-22 MED ORDER — FENTANYL CITRATE 0.05 MG/ML IJ SOLN: 50.0000 ug | INTRAMUSCULAR | Status: DC | PRN

## 2013-04-22 MED ORDER — ACETAMINOPHEN 10 MG/ML IV SOLN: 1000.0000 mg | Freq: Once | INTRAVENOUS | Status: DC | PRN

## 2013-04-22 MED ORDER — LACTATED RINGERS IV SOLN
INTRAVENOUS | Status: DC | PRN
  Administered 2013-04-22: 3000 mL

## 2013-04-22 MED ORDER — MIDAZOLAM HCL 5 MG/5ML IJ SOLN
INTRAMUSCULAR | Status: DC | PRN
  Administered 2013-04-22: 2 mg via INTRAVENOUS

## 2013-04-22 MED ORDER — DEXTROSE 5 % IV SOLN
2.0000 g | Freq: Two times a day (BID) | INTRAVENOUS | Status: DC
  Filled 2013-04-22: qty 2

## 2013-04-22 SURGICAL SUPPLY — 84 items
APPLIER CLIP 5 13 M/L LIGAMAX5 (MISCELLANEOUS)
APPLIER CLIP ROT 10 11.4 M/L (STAPLE)
APR CLP MED LRG 11.4X10 (STAPLE)
APR CLP MED LRG 5 ANG JAW (MISCELLANEOUS)
BLADE EXTENDED COATED 6.5IN (ELECTRODE) ×1 IMPLANT
BLADE HEX COATED 2.75 (ELECTRODE) ×3 IMPLANT
BLADE SURG SZ10 CARB STEEL (BLADE) ×2 IMPLANT
CABLE HIGH FREQUENCY MONO STRZ (ELECTRODE) ×3 IMPLANT
CANISTER SUCTION 2500CC (MISCELLANEOUS) ×2 IMPLANT
CANNULA ENDOPATH XCEL 11M (ENDOMECHANICALS) IMPLANT
CELLS DAT CNTRL 66122 CELL SVR (MISCELLANEOUS) IMPLANT
CLIP APPLIE 5 13 M/L LIGAMAX5 (MISCELLANEOUS) IMPLANT
CLIP APPLIE ROT 10 11.4 M/L (STAPLE) IMPLANT
CLOTH BEACON ORANGE TIMEOUT ST (SAFETY) ×2 IMPLANT
COVER MAYO STAND STRL (DRAPES) ×3 IMPLANT
DECANTER SPIKE VIAL GLASS SM (MISCELLANEOUS) ×1 IMPLANT
DISSECTOR BLUNT TIP ENDO 5MM (MISCELLANEOUS) ×1 IMPLANT
DRAIN CHANNEL 19F RND (DRAIN) ×1 IMPLANT
DRAPE LAPAROSCOPIC ABDOMINAL (DRAPES) ×2 IMPLANT
DRAPE LG THREE QUARTER DISP (DRAPES) ×3 IMPLANT
DRAPE UTILITY XL STRL (DRAPES) ×4 IMPLANT
DRAPE WARM FLUID 44X44 (DRAPE) ×2 IMPLANT
DRSG PAD ABDOMINAL 8X10 ST (GAUZE/BANDAGES/DRESSINGS) ×1 IMPLANT
ELECT BLADE TIP CTD 4 INCH (ELECTRODE) ×1 IMPLANT
ELECT REM PT RETURN 9FT ADLT (ELECTROSURGICAL) ×2
ELECTRODE REM PT RTRN 9FT ADLT (ELECTROSURGICAL) ×1 IMPLANT
EVACUATOR SILICONE 100CC (DRAIN) ×1 IMPLANT
FILTER SMOKE EVAC LAPAROSHD (FILTER) IMPLANT
GAUZE SPONGE 2X2 8PLY STRL LF (GAUZE/BANDAGES/DRESSINGS) IMPLANT
GLOVE BIOGEL PI IND STRL 7.0 (GLOVE) ×1 IMPLANT
GLOVE BIOGEL PI INDICATOR 7.0 (GLOVE) ×1
GLOVE ECLIPSE 8.0 STRL XLNG CF (GLOVE) ×4 IMPLANT
GLOVE INDICATOR 8.0 STRL GRN (GLOVE) ×4 IMPLANT
GOWN STRL NON-REIN LRG LVL3 (GOWN DISPOSABLE) ×2 IMPLANT
GOWN STRL REIN XL XLG (GOWN DISPOSABLE) ×11 IMPLANT
KIT BASIN OR (CUSTOM PROCEDURE TRAY) ×2 IMPLANT
LEGGING LITHOTOMY PAIR STRL (DRAPES) ×1 IMPLANT
LIGASURE IMPACT 36 18CM CVD LR (INSTRUMENTS) ×2 IMPLANT
LIGASURE LAP ATLAS 10MM 37CM (INSTRUMENTS) IMPLANT
NS IRRIG 1000ML POUR BTL (IV SOLUTION) ×5 IMPLANT
PENCIL BUTTON HOLSTER BLD 10FT (ELECTRODE) ×2 IMPLANT
RELOAD PROXIMATE 75MM BLUE (ENDOMECHANICALS) ×6 IMPLANT
RELOAD STAPLE 75 3.8 BLU REG (ENDOMECHANICALS) IMPLANT
RETRACTOR WND ALEXIS 18 MED (MISCELLANEOUS) IMPLANT
RTRCTR WOUND ALEXIS 18CM MED (MISCELLANEOUS)
SCALPEL HARMONIC ACE (MISCELLANEOUS) IMPLANT
SCISSORS LAP 5X35 DISP (ENDOMECHANICALS) ×2 IMPLANT
SCISSORS LAP 5X45 EPIX DISP (ENDOMECHANICALS) ×1 IMPLANT
SET IRRIG TUBING LAPAROSCOPIC (IRRIGATION / IRRIGATOR) ×1 IMPLANT
SHEARS CURVED HARMONIC AC 45CM (MISCELLANEOUS) ×1 IMPLANT
SLEEVE XCEL OPT CAN 5 100 (ENDOMECHANICALS) ×3 IMPLANT
SOLUTION ANTI FOG 6CC (MISCELLANEOUS) ×2 IMPLANT
SPONGE DRAIN TRACH 4X4 STRL 2S (GAUZE/BANDAGES/DRESSINGS) ×1 IMPLANT
SPONGE GAUZE 2X2 STER 10/PKG (GAUZE/BANDAGES/DRESSINGS) ×1
SPONGE GAUZE 4X4 12PLY (GAUZE/BANDAGES/DRESSINGS) ×2 IMPLANT
SPONGE LAP 18X18 X RAY DECT (DISPOSABLE) ×4 IMPLANT
STAPLER CIRC ILS CVD 33MM 37CM (STAPLE) ×1 IMPLANT
STAPLER PROXIMATE 75MM BLUE (STAPLE) ×1 IMPLANT
STAPLER VISISTAT 35W (STAPLE) ×2 IMPLANT
SUCTION POOLE TIP (SUCTIONS) ×2 IMPLANT
SUT NYLON 3 0 (SUTURE) ×1 IMPLANT
SUT PDS AB 1 CTX 36 (SUTURE) IMPLANT
SUT PDS AB 1 TP1 96 (SUTURE) ×2 IMPLANT
SUT PROLENE 2 0 SH DA (SUTURE) ×1 IMPLANT
SUT SILK 2 0 (SUTURE) ×2
SUT SILK 2 0 SH CR/8 (SUTURE) ×2 IMPLANT
SUT SILK 2-0 18XBRD TIE 12 (SUTURE) ×1 IMPLANT
SUT SILK 3 0 (SUTURE) ×2
SUT SILK 3 0 SH CR/8 (SUTURE) ×2 IMPLANT
SUT SILK 3-0 18XBRD TIE 12 (SUTURE) ×1 IMPLANT
SUT VICRYL 2 0 18  UND BR (SUTURE)
SUT VICRYL 2 0 18 UND BR (SUTURE) ×1 IMPLANT
SYS LAPSCP GELPORT 120MM (MISCELLANEOUS) ×2
SYSTEM LAPSCP GELPORT 120MM (MISCELLANEOUS) IMPLANT
TOWEL OR 17X26 10 PK STRL BLUE (TOWEL DISPOSABLE) ×3 IMPLANT
TRAY FOLEY CATH 14FRSI W/METER (CATHETERS) ×2 IMPLANT
TRAY LAP CHOLE (CUSTOM PROCEDURE TRAY) ×2 IMPLANT
TROCAR BLADELESS OPT 5 100 (ENDOMECHANICALS) ×1 IMPLANT
TROCAR BLADELESS OPT 5 75 (ENDOMECHANICALS) ×5 IMPLANT
TROCAR XCEL BLUNT TIP 100MML (ENDOMECHANICALS) IMPLANT
TROCAR XCEL NON-BLD 11X100MML (ENDOMECHANICALS) IMPLANT
TUBING INSUFFLATION 10FT LAP (TUBING) ×2 IMPLANT
YANKAUER SUCT BULB TIP 10FT TU (MISCELLANEOUS) ×2 IMPLANT
YANKAUER SUCT BULB TIP NO VENT (SUCTIONS) ×2 IMPLANT

## 2013-04-22 NOTE — Transfer of Care (Addendum)
Immediate Anesthesia Transfer of Care Note  Patient: [name]  Procedure(s) Performed: Procedure(s): LAPAROSCOPIC PARTIAL COLECTOMY (N/A)  Patient Location: PACU  Anesthesia Type:General  Level of Consciousness: awake, sedated, patient cooperative and responds to stimulation, drowsy, ventilates well with coaching  Airway & Oxygen Therapy: Patient Spontanous Breathing and Patient connected to face mask  Post-op Assessment: Report given to PACU RN, Post -op Vital signs reviewed and stable and Patient moving all extremities  Post vital signs: Reviewed and stable  Complications: No apparent anesthesia complications

## 2013-04-22 NOTE — Anesthesia Procedure Notes (Signed)
Procedure Name: Intubation Date/Time: 04/22/2013 10:58 AM Performed by: Hulan Fess Pre-anesthesia Checklist: Patient identified, Emergency Drugs available, Suction available, Patient being monitored and Timeout performed Patient Re-evaluated:Patient Re-evaluated prior to inductionOxygen Delivery Method: Circle system utilized Preoxygenation: Pre-oxygenation with 100% oxygen Intubation Type: IV induction Ventilation: Mask ventilation without difficulty Laryngoscope Size: Mac and 3 Grade View: Grade I Tube type: Oral Tube size: 8.0 mm Number of attempts: 1 Placement Confirmation: ETT inserted through vocal cords under direct vision,  positive ETCO2 and breath sounds checked- equal and bilateral Secured at: 22.5 cm Tube secured with: Tape Dental Injury: Teeth and Oropharynx as per pre-operative assessment

## 2013-04-22 NOTE — Anesthesia Postprocedure Evaluation (Signed)
Anesthesia Post Note  Patient: [name]  Procedure(s) Performed: Procedure(s) (LRB): LAPAROSCOPIC PARTIAL COLECTOMY (N/A)  Anesthesia type: General  Patient location: PACU  Post pain: Pain level controlled  Post assessment: Post-op Vital signs reviewed  Last Vitals: BP 102/55  Pulse 60  Temp(Src) 36.3 C (Oral)  Resp 13  SpO2 100%  Post vital signs: Reviewed  Level of consciousness: sedated  Complications: No apparent anesthesia complications

## 2013-04-22 NOTE — Interval H&P Note (Signed)
History and Physical Interval Note:  04/22/2013 10:42 AM  [name]  has presented today for surgery, with the diagnosis of RECURRENT DIVERTICULITIS   The various methods of treatment have been discussed with the patient and family. After consideration of risks, benefits and other options for treatment, the patient has consented to  Procedure(s): LAPAROSCOPIC PARTIAL COLECTOMY (N/A) as a surgical intervention .  The patient's history has been reviewed, patient examined, no change in status, stable for surgery.  I have reviewed the patient's chart and labs.  Questions were answered to the patient's satisfaction.     Henreitta Spittler Shela Commons

## 2013-04-22 NOTE — H&P (Signed)
[name] is an 62 y.o. female.   Chief Complaint:   Here for elective partial colectomy. HPI: She has had multiple bouts of sigmoid and left sided diverticulitis. Her recent colonoscopy did not demonstrate any active diverticulitis. She is here now for elective laparoscopic possible open partial colectomy.  Past Medical History  Diagnosis Date  . Asthma   . Diverticulitis   . Diverticulosis   . Thyroid disease     hypo  . Hypertension   . Diabetes mellitus     not on meds.  . Hypothyroidism   . Migraine   . Anemia     none recently  . Depression     Past Surgical History  Procedure Laterality Date  . Cesarean section    . Abdominal hysterectomy    . Knee cartilage surgery      Left  . Cataract extraction, bilateral    . Colonoscopy      multiple   . Esophagogastroduodenoscopy endoscopy      multiple  . Right foot surgery  little toe and next toe    x 3    Family History  Problem Relation Age of Onset  . Heart disease Mother   . Kidney failure Father   . Diabetes Father   . Breast cancer Paternal Aunt   . Colon cancer Neg Hx    Social History:  reports that she quit smoking about 30 years ago. Her smoking use included Cigarettes. She has a 3 pack-year smoking history. She has never used smokeless tobacco. She reports that  drinks alcohol. She reports that she does not use illicit drugs.  Allergies:  Allergies  Allergen Reactions  . Levofloxacin Anaphylaxis    Possibly Levaquin or Lisinopril, pt was taking both at the time of reaction  . Lisinopril Anaphylaxis    Possibly Lisinopril or Levaquin, pt was taking both at the time of reaction  . Bee Venom Swelling  . Morphine And Related Itching    Needs benadryl prior to     Medications Prior to Admission  Medication Sig Dispense Refill  . albuterol (PROVENTIL HFA;VENTOLIN HFA) 108 (90 BASE) MCG/ACT inhaler Inhale 2 puffs into the lungs every 6 (six) hours as needed for wheezing. For wheezing      .  ALPRAZolam (XANAX) 0.5 MG tablet Take 0.5 mg by mouth 2 (two) times daily as needed for sleep. For sleep/anxiety      . Amoxicillin-Pot Clavulanate (AUGMENTIN PO) Take 1 tablet by mouth 2 (two) times daily.      Marland Kitchen aspirin 81 MG tablet Take 81 mg by mouth daily.        Marland Kitchen aspirin-acetaminophen-caffeine (EXCEDRIN MIGRAINE) 250-250-65 MG per tablet Take 1 tablet by mouth every 6 (six) hours as needed. For pain/headache      . cetirizine (ZYRTEC) 10 MG tablet Take 10 mg by mouth at bedtime.       . Cholecalciferol (VITAMIN D) 2000 UNITS tablet Take 2,000 Units by mouth daily.        Marland Kitchen escitalopram (LEXAPRO) 10 MG tablet Take 10 mg by mouth at bedtime.      Marland Kitchen esomeprazole (NEXIUM) 40 MG capsule Take 40 mg by mouth 2 (two) times daily.       . famotidine (PEPCID) 20 MG tablet Take 20 mg by mouth at bedtime.        . folic acid (FOLVITE) 1 MG tablet Take 1 mg by mouth daily.      . furosemide (LASIX) 40  MG tablet Take 40 mg by mouth daily.       . hyoscyamine (LEVBID) 0.375 MG 12 hr tablet Take 0.375 mg by mouth every morning.       Marland Kitchen levothyroxine (SYNTHROID, LEVOTHROID) 200 MCG tablet Take 200 mcg by mouth daily before breakfast.      . losartan (COZAAR) 50 MG tablet Take 50 mg by mouth every morning.       . metoprolol tartrate (LOPRESSOR) 25 MG tablet Take 25 mg by mouth every morning.       . metroNIDAZOLE (FLAGYL) 500 MG tablet Take 1 tablet (500 mg total) by mouth every 8 (eight) hours.  75 tablet  1  . polyethylene glycol powder (GLYCOLAX/MIRALAX) powder Take 17 g by mouth daily as needed (for constipation).       . pyridOXINE (VITAMIN B-6) 50 MG tablet Take 50 mg by mouth daily.      . simvastatin (ZOCOR) 10 MG tablet Take 10 mg by mouth at bedtime.      . traZODone (DESYREL) 150 MG tablet Take 150 mg by mouth at bedtime.      . vitamin B-12 (CYANOCOBALAMIN) 100 MCG tablet Take 100 mcg by mouth every morning.       . zileuton (ZYFLO CR) 600 MG CR tablet Take 1,200 mg by mouth every morning.        . zolpidem (AMBIEN) 10 MG tablet TAKE 1 TABLET BY MOUTH AT BEDTIME AS NEEDED  90 tablet  1  . EPINEPHrine (EPIPEN 2-PAK) 0.3 mg/0.3 mL DEVI Inject 0.3 mg into the muscle once. levaquin        Results for orders placed during the hospital encounter of 04/22/13 (from the past 48 hour(s))  GLUCOSE, CAPILLARY     Status: Abnormal   Collection Time    04/22/13  8:59 AM      Result Value Range   Glucose-Capillary 106 (*) 70 - 99 mg/dL   Comment 1 Documented in Chart    TYPE AND SCREEN     Status: None   Collection Time    04/22/13  9:00 AM      Result Value Range   ABO/RH(D) A POS     Antibody Screen NEG     Sample Expiration 04/25/2013    ABO/RH     Status: None   Collection Time    04/22/13  9:00 AM      Result Value Range   ABO/RH(D) A POS     No results found.  Review of Systems  Constitutional: Positive for fever. Negative for chills.  HENT: Negative for sore throat.   Respiratory: Negative for cough.   Gastrointestinal: Negative for nausea, vomiting, abdominal pain and diarrhea.    Blood pressure 128/79, pulse 59, temperature 97.9 F (36.6 C), temperature source Oral, resp. rate 18, SpO2 94.00%. Physical Exam  Constitutional: No distress.  Obese.  HENT:  Head: Normocephalic and atraumatic.  Cardiovascular: Normal rate and regular rhythm.   Respiratory: Effort normal and breath sounds normal.  GI: Soft. She exhibits no distension and no mass. There is no tenderness.  Musculoskeletal: She exhibits no edema.  Neurological: She is alert.  Skin: Skin is warm and dry.     Assessment/Plan Recurrent episodes of the descending and sigmoid diverticulitis.  Plan laparoscopic assisted possible open partial colectomy. The procedure, risks, and after care had been discussed with her preoperatively.  Chrishonda Hesch J 04/22/2013, 10:37 AM

## 2013-04-22 NOTE — Op Note (Signed)
Operative Note  CHRISTE [name] female 62 y.o. 04/22/2013  PREOPERATIVE DX:  Recurrent sigmoid and descending colon diverticulitis  POSTOPERATIVE DX:  Same  PROCEDURE:  Laparoscopic assisted left colectomy with mobilization of splenic flexure         Surgeon: Adolph Pollack   Assistants: Gaynelle Adu M.D.  Anesthesia: General endotracheal anesthesia  Indications: This is a 62 year old female who's had multiple episodes of sigmoid diverticulitis as well as descending colon diverticulitis. Colonoscopy demonstrates no tumors. She now presents for the above procedure.    Procedure Detail:  She was seen in the holding area and then brought to the operating room placed supine on the operating table and a general anesthetic was given. She was placed in the lithotomy position. A Foley catheter and oral gastric tube were inserted. The abdominal wall and perineal areas were widely sterilely prepped and draped.  She was placed in slight reverse Trendelenburg position. A 5 mm incision was made in the left subcostal region. Using a 5 mm Optiview trocar and laparoscope access was gained to the peritoneal cavity. A pneumoperitoneum was created. Inspection of the area underneath the trocar demonstrated no evidence of bleeding or organ injury.  A 5 mm trocar was placed in the supraumbilical region. A 5 mm trocar was placed in the right lower quadrant. A 5 mm trocar is placed in the right lower line. The sigmoid colon and descending colon were identified and areas of chronic inflammatory changes were noted. There were adhesions between the small bowel and the anterior pelvis as well. I began mobilizing the sigmoid colon and descending colon by dividing the lateral attachments and inflammatory lesions using sharp dissection. The splenic flexure was identified. I began mobilizing the splenic flexure using sharp dissection and selective electrocautery. Subsequently, I began dissecting the omentum free from  the transverse colon using the harmonic scalpel allowing the transverse colon to fall into the pelvic area. I then divided some of the mesentery of the splenic flexure further mobilizing the left colon. In order to do this, I had to place a trocar in the left lower quadrant, and a trocar in the right upper quadrant. I then mobilize some of the adhesions to the pelvis using sharp dissection.   The lower midline trocar was removed and a limited lower midline incision was made through all layers and entering the peritoneal cavity. I subsequently mobilized the small bowel by dividing some of the adhesions. I was able to mobilize the distal sigmoid colon and proximal rectal area. I divided the left colon just proximal to the splenic flexure using the GIA stapler. I divided the mesentery close to the bowel wall and staying above the ureter with the LigaSure. I then divided the proximal rectum just inferior to the rectosigmoid junction using the GIA stapler. The proximal aspect of the specimen was marked and it was handed off the field.  I have some significant mobility of the distal half of the transverse colon. This was able to be approximated to the rectal stump.  A size 29 EEA anvil was placed into the distal transverse colon and brought out the side. A pursestring suture of 2-0 Prolene was used to hold the anvil in place. The colon defect was then closed and a small segment of transverse colon resected with a GIA stapler. Subsequently, an end rectum to side transverse colon anastomosis was created with a size 29 EEA stapler. 2 intact donuts were noted.  Air was insufflated a through the rectum and a  small air leak was noted on the right posterior lateral aspect. I reinforced the anastomosis with interrupted 3-0 silk sutures. The leak test was repeated and no leak was noted. The anastomosis was patent, viable, and under no tension. The small intestine was then examined and was intact without evidence of defect.  There was no evidence of organ injury or bleeding.  A small incision was made in the right lower quadrant. A size 19 Blake drain was then placed into the pelvis and then directed up the left gutter to be near the anastomosis. It was anchored to the skin with a 3-0 nylon suture.  The abdominal cavity was then copiously irrigated with saline solution.  A limited lower midline incision fascia was then closed with a running #1 double looped PDS suture. Laparoscopy was performed and the fascial closure was solid. 4 quadrant inspection was performed and there was no evidence of bleeding or organ injury.  All trocars were removed. The skin incisions were then closed with staples followed by sterile dressings. The drain was hooked up to closed suction.  She tolerated the procedure well without any apparent complications and was taken to the recovery in satisfactory condition.   Estimated Blood Loss:  200 mL         Drains: JACKSON-PRATT (JP)  Blood Given: none          Specimens: Left colon with segment of transverse colon        Complications:  * No complications entered in OR log *         Disposition: PACU - hemodynamically stable.         Condition: stable

## 2013-04-22 NOTE — Anesthesia Preprocedure Evaluation (Addendum)
Anesthesia Evaluation  Patient identified by MRN, date of birth, ID band Patient awake    Reviewed: Allergy & Precautions, H&P , NPO status , Patient's Chart, lab work & pertinent test results, reviewed documented beta blocker date and time   Airway Mallampati: II TM Distance: >3 FB Neck ROM: Full    Dental  (+) Dental Advisory Given and Teeth Intact   Pulmonary asthma , former smoker,  breath sounds clear to auscultation        Cardiovascular hypertension, Pt. on medications and Pt. on home beta blockers Rhythm:Regular Rate:Normal     Neuro/Psych  Headaches, PSYCHIATRIC DISORDERS Depression    GI/Hepatic Neg liver ROS, GERD-  Medicated,  Endo/Other  diabetes, Type 2, Oral Hypoglycemic AgentsHypothyroidism Morbid obesity  Renal/GU negative Renal ROS     Musculoskeletal negative musculoskeletal ROS (+)   Abdominal (+) + obese,   Peds  Hematology negative hematology ROS (+)   Anesthesia Other Findings   Reproductive/Obstetrics negative OB ROS                        Anesthesia Physical Anesthesia Plan  ASA: III  Anesthesia Plan: General   Post-op Pain Management:    Induction: Intravenous  Airway Management Planned: Oral ETT  Additional Equipment:   Intra-op Plan:   Post-operative Plan: Extubation in OR  Informed Consent: I have reviewed the patients History and Physical, chart, labs and discussed the procedure including the risks, benefits and alternatives for the proposed anesthesia with the patient or authorized representative who has indicated his/her understanding and acceptance.   Dental advisory given  Plan Discussed with: CRNA  Anesthesia Plan Comments:         Anesthesia Quick Evaluation

## 2013-04-23 ENCOUNTER — Encounter (HOSPITAL_COMMUNITY): Payer: Self-pay | Admitting: General Surgery

## 2013-04-23 DIAGNOSIS — E119 Type 2 diabetes mellitus without complications: Secondary | ICD-10-CM

## 2013-04-23 LAB — CBC
HCT: 34.3 % — ABNORMAL LOW (ref 36.0–46.0)
MCHC: 31.8 g/dL (ref 30.0–36.0)
MCV: 87.5 fL (ref 78.0–100.0)
RDW: 14.8 % (ref 11.5–15.5)

## 2013-04-23 LAB — BASIC METABOLIC PANEL
BUN: 8 mg/dL (ref 6–23)
CO2: 31 mEq/L (ref 19–32)
Chloride: 101 mEq/L (ref 96–112)
Creatinine, Ser: 0.74 mg/dL (ref 0.50–1.10)

## 2013-04-23 LAB — GLUCOSE, CAPILLARY
Glucose-Capillary: 121 mg/dL — ABNORMAL HIGH (ref 70–99)
Glucose-Capillary: 128 mg/dL — ABNORMAL HIGH (ref 70–99)

## 2013-04-23 MED ORDER — METOPROLOL TARTRATE 25 MG PO TABS
25.0000 mg | ORAL_TABLET | Freq: Every morning | ORAL | Status: DC
  Administered 2013-04-24 – 2013-05-01 (×8): 25 mg via ORAL
  Filled 2013-04-23 (×9): qty 1

## 2013-04-23 MED ORDER — KETOROLAC TROMETHAMINE 30 MG/ML IJ SOLN
30.0000 mg | Freq: Four times a day (QID) | INTRAMUSCULAR | Status: AC
  Administered 2013-04-23 – 2013-04-24 (×4): 30 mg via INTRAVENOUS
  Filled 2013-04-23 (×4): qty 1

## 2013-04-23 MED ORDER — ACETAMINOPHEN 325 MG PO TABS
650.0000 mg | ORAL_TABLET | Freq: Four times a day (QID) | ORAL | Status: DC | PRN
  Administered 2013-04-23 – 2013-05-01 (×8): 650 mg via ORAL
  Filled 2013-04-23 (×8): qty 2

## 2013-04-23 NOTE — Care Management Note (Addendum)
    Page 1 of 2   04/30/2013     1:18:04 PM   CARE MANAGEMENT NOTE 04/30/2013  Patient:  [name], [name]   Account Number:  [id number]  Date Initiated:  04/23/2013  Documentation initiated by:  Lorenda Ishihara  Subjective/Objective Assessment:   62 yo female admitted s/p lap colectomy. PTA lived at home with parent.     Action/Plan:   Home when stable   Anticipated DC Date:  04/26/2013   Anticipated DC Plan:  HOME W HOME HEALTH SERVICES      DC Planning Services  CM consult      PAC Choice  DURABLE MEDICAL EQUIPMENT  HOME HEALTH   Choice offered to / List presented to:  C-1 Patient   DME arranged  [facility]      DME agency  [facility]     [facility] arranged  HH-1 RN      [facility] agency  [facility]   Status of service:  Completed, signed off Medicare Important Message given?   (If response is "NO", the following Medicare IM given date fields will be blank) Date Medicare IM given:   Date Additional Medicare IM given:    Discharge Disposition:  HOME W HOME HEALTH SERVICES  Per UR Regulation:  Reviewed for med. necessity/level of care/duration of stay  If discussed at Long Length of Stay Meetings, dates discussed:   04/30/2013    Comments:  04-30-13 Lorenda Ishihara RN CM 1200 Per attending, patient to d/c tomorrow if stable with wound vac. Plans for d/c of JP drain. Contacted [facility] for [facility] services and initiated application for vac. Spoke with Mayra Reel as well from [facility] regarding vac.  04/29/2013 [name] BSN RN CCM [phone] Awaiting orders for poss home wound vac if needed. CM will follow. Pt plans to return to her home in [place] where she lives with mother. States  her sister takes care of their mother and [name] offer her some support. Pt states she does not need RW. If [facility] services are needed she is requesting [facility]. Currrently not sure if she will need [facility] services for wound vac of home IV abx. CM will folllow. [facility]  notified of possibility of HH services.

## 2013-04-23 NOTE — Progress Notes (Signed)
1 Day Post-Op  Subjective: A lot of incisional pain.  Dangled.  No nausea.  Has a headache.  Itching some.  Objective: Vital signs in last 24 hours: Temp:  [97.3 F (36.3 C)-98.6 F (37 C)] 98.6 F (37 C) (06/10 0649) Pulse Rate:  [55-78] 69 (06/10 0649) Resp:  [11-18] 14 (06/10 0754) BP: (91-112)/(42-68) 98/62 mmHg (06/10 0649) SpO2:  [95 %-100 %] 98 % (06/10 0754) Weight:  [270 lb (122.471 kg)] 270 lb (122.471 kg) (06/09 1739) Last BM Date: 04/22/13  Intake/Output from previous day: 06/09 0701 - 06/10 0700 In: 5833.3 [I.V.:5833.3] Out: 1105 [Urine:510; Drains:395; Blood:200] Intake/Output this shift:    PE: General- In NAD Abdomen-soft, dried drainage on drain dressing, hypoactive bowel sounds, drain output is thin and serosanguinous  Lab Results:   Recent Labs  04/23/13 0433  WBC 16.7*  HGB 10.9*  HCT 34.3*  PLT 219   BMET  Recent Labs  04/23/13 0433  NA 136  K 3.9  CL 101  CO2 31  GLUCOSE 157*  BUN 8  CREATININE 0.74  CALCIUM 8.1*   PT/INR No results found for this basename: LABPROT, INR,  in the last 72 hours Comprehensive Metabolic Panel:    Component Value Date/Time   NA 136 04/23/2013 0433   K 3.9 04/23/2013 0433   CL 101 04/23/2013 0433   CO2 31 04/23/2013 0433   BUN 8 04/23/2013 0433   CREATININE 0.74 04/23/2013 0433   CREATININE 1.10 12/04/2012 1015   GLUCOSE 157* 04/23/2013 0433   CALCIUM 8.1* 04/23/2013 0433   AST 23 04/15/2013 1530   ALT 20 04/15/2013 1530   ALKPHOS 77 04/15/2013 1530   BILITOT 0.1* 04/15/2013 1530   PROT 6.7 04/15/2013 1530   ALBUMIN 3.2* 04/15/2013 1530     Studies/Results: No results found.  Anti-infectives: Anti-infectives   Start     Dose/Rate Route Frequency Ordered Stop   04/22/13 2200  cefoTEtan (CEFOTAN) 2 g in dextrose 5 % 50 mL IVPB  Status:  Discontinued     2 g 100 mL/hr over 30 Minutes Intravenous Every 12 hours 04/22/13 1735 04/22/13 1748   04/22/13 1830  cefOXitin (MEFOXIN) 2 g in dextrose 5 % 50 mL IVPB      2 g 100 mL/hr over 30 Minutes Intravenous Every 6 hours 04/22/13 1750 04/23/13 0629   04/22/13 0831  cefOXitin (MEFOXIN) 2 g in dextrose 5 % 50 mL IVPB     2 g 100 mL/hr over 30 Minutes Intravenous On call to O.R. 04/22/13 0831 04/22/13 1113      Assessment Active Problems:   H/O diverticulitis of SIGMOID and left colon s/p laparoscopic assisted left colectomy 04/22/13-conduct of operation discussed with her; has itching with all narcotics.  Depression-meds held   HYPOTHYROIDISM   ASTHMA   DM Type II-on SSI with CBGs 109-148     LOS: 1 day   Plan: OOB.  Clear liquids.  Add Toradol for 24 hours.  Tylenol for headache.  Benadryl for itching as needed.   Sameen Leas J 04/23/2013

## 2013-04-24 LAB — GLUCOSE, CAPILLARY
Glucose-Capillary: 106 mg/dL — ABNORMAL HIGH (ref 70–99)
Glucose-Capillary: 111 mg/dL — ABNORMAL HIGH (ref 70–99)
Glucose-Capillary: 125 mg/dL — ABNORMAL HIGH (ref 70–99)
Glucose-Capillary: 135 mg/dL — ABNORMAL HIGH (ref 70–99)
Glucose-Capillary: 135 mg/dL — ABNORMAL HIGH (ref 70–99)

## 2013-04-24 LAB — CBC
HCT: 29.9 % — ABNORMAL LOW (ref 36.0–46.0)
Hemoglobin: 9.5 g/dL — ABNORMAL LOW (ref 12.0–15.0)
MCH: 27.6 pg (ref 26.0–34.0)
MCHC: 31.8 g/dL (ref 30.0–36.0)

## 2013-04-24 MED ORDER — DIPHENHYDRAMINE HCL 12.5 MG/5ML PO ELIX: 12.5000 mg | ORAL_SOLUTION | Freq: Four times a day (QID) | ORAL | Status: DC | PRN

## 2013-04-24 MED ORDER — DEXTROSE IN LACTATED RINGERS 5 % IV SOLN
INTRAVENOUS | Status: DC
  Administered 2013-04-24 – 2013-04-30 (×6): via INTRAVENOUS

## 2013-04-24 MED ORDER — SODIUM CHLORIDE 0.9 % IJ SOLN: 9.0000 mL | INTRAMUSCULAR | Status: DC | PRN

## 2013-04-24 MED ORDER — NALOXONE HCL 0.4 MG/ML IJ SOLN: 0.4000 mg | INTRAMUSCULAR | Status: DC | PRN

## 2013-04-24 MED ORDER — DIPHENHYDRAMINE HCL 50 MG/ML IJ SOLN
12.5000 mg | Freq: Four times a day (QID) | INTRAMUSCULAR | Status: DC | PRN
  Administered 2013-04-26: 12.5 mg via INTRAVENOUS
  Filled 2013-04-24: qty 1

## 2013-04-24 MED ORDER — DIPHENHYDRAMINE HCL 12.5 MG/5ML PO ELIX
12.5000 mg | ORAL_SOLUTION | Freq: Four times a day (QID) | ORAL | Status: DC | PRN
  Administered 2013-04-26: 12.5 mg via ORAL
  Filled 2013-04-24: qty 5

## 2013-04-24 MED ORDER — KETOROLAC TROMETHAMINE 30 MG/ML IJ SOLN
30.0000 mg | Freq: Four times a day (QID) | INTRAMUSCULAR | Status: DC
  Filled 2013-04-24: qty 1

## 2013-04-24 MED ORDER — DIPHENHYDRAMINE HCL 50 MG/ML IJ SOLN: 12.5000 mg | Freq: Four times a day (QID) | INTRAMUSCULAR | Status: DC | PRN

## 2013-04-24 MED ORDER — HYDROMORPHONE 0.3 MG/ML IV SOLN: INTRAVENOUS | Status: DC

## 2013-04-24 MED ORDER — ONDANSETRON HCL 4 MG/2ML IJ SOLN
4.0000 mg | Freq: Four times a day (QID) | INTRAMUSCULAR | Status: DC | PRN
  Administered 2013-04-24 – 2013-04-29 (×4): 4 mg via INTRAVENOUS
  Filled 2013-04-24 (×4): qty 2

## 2013-04-24 MED ORDER — HYDROMORPHONE 0.3 MG/ML IV SOLN
INTRAVENOUS | Status: DC
  Administered 2013-04-24: 0.599 mg via INTRAVENOUS

## 2013-04-24 MED ORDER — KETOROLAC TROMETHAMINE 30 MG/ML IJ SOLN
30.0000 mg | Freq: Four times a day (QID) | INTRAMUSCULAR | Status: AC
  Administered 2013-04-24 – 2013-04-25 (×4): 30 mg via INTRAVENOUS
  Filled 2013-04-24 (×6): qty 1

## 2013-04-24 MED ORDER — ONDANSETRON HCL 4 MG/2ML IJ SOLN: 4.0000 mg | Freq: Four times a day (QID) | INTRAMUSCULAR | Status: DC | PRN

## 2013-04-24 NOTE — Progress Notes (Signed)
Pharmacy Brief Note - Alvimopan (Entereg)  The standing order set for alvimopan (Entereg) now includes an automatic order to discontinue the drug after the patient has had a bowel movement.  The change was approved by the Pharmacy & Therapeutics Committee and the Medical Executive Committee.    This patient has had a bowel movement documented by nursing.  Therefore, alvimopan has been discontinued after this am's dose.  If there are questions, please contact the pharmacy at [phone].  Thank you-  Loralee Pacas, PharmD, BCPS 04/24/2013 8:30 AM

## 2013-04-24 NOTE — Progress Notes (Signed)
2 Days Post-Op  Subjective: Pain is better.  Had a small liquid BM.  Sat in chair yesterday.  Objective: Vital signs in last 24 hours: Temp:  [97.8 F (36.6 C)-98.6 F (37 C)] 98.4 F (36.9 C) (06/11 0630) Pulse Rate:  [73-86] 86 (06/11 0630) Resp:  [12-18] 15 (06/11 0630) BP: (97-128)/(65-76) 120/76 mmHg (06/11 0630) SpO2:  [97 %-100 %] 100 % (06/11 0630) FiO2 (%):  [51 %-53 %] 51 % (06/11 0413) Last BM Date: 04/22/13  Intake/Output from previous day: 06/10 0701 - 06/11 0700 In: 4680 [P.O.:1080; I.V.:3600] Out: 1780 [Urine:1600; Drains:180] Intake/Output this shift:    PE: General- In NAD Abdomen-soft, dried drainage on drain dressing, hypoactive bowel sounds, drain output is thin and serosanguinous  Lab Results:   Recent Labs  04/23/13 0433 04/24/13 0437  WBC 16.7* 12.1*  HGB 10.9* 9.5*  HCT 34.3* 29.9*  PLT 219 173   BMET  Recent Labs  04/23/13 0433  NA 136  K 3.9  CL 101  CO2 31  GLUCOSE 157*  BUN 8  CREATININE 0.74  CALCIUM 8.1*   PT/INR No results found for this basename: LABPROT, INR,  in the last 72 hours Comprehensive Metabolic Panel:    Component Value Date/Time   NA 136 04/23/2013 0433   K 3.9 04/23/2013 0433   CL 101 04/23/2013 0433   CO2 31 04/23/2013 0433   BUN 8 04/23/2013 0433   CREATININE 0.74 04/23/2013 0433   CREATININE 1.10 12/04/2012 1015   GLUCOSE 157* 04/23/2013 0433   CALCIUM 8.1* 04/23/2013 0433   AST 23 04/15/2013 1530   ALT 20 04/15/2013 1530   ALKPHOS 77 04/15/2013 1530   BILITOT 0.1* 04/15/2013 1530   PROT 6.7 04/15/2013 1530   ALBUMIN 3.2* 04/15/2013 1530     Studies/Results: No results found.  Anti-infectives: Anti-infectives   Start     Dose/Rate Route Frequency Ordered Stop   04/22/13 2200  cefoTEtan (CEFOTAN) 2 g in dextrose 5 % 50 mL IVPB  Status:  Discontinued     2 g 100 mL/hr over 30 Minutes Intravenous Every 12 hours 04/22/13 1735 04/22/13 1748   04/22/13 1830  cefOXitin (MEFOXIN) 2 g in dextrose 5 % 50 mL IVPB      2 g 100 mL/hr over 30 Minutes Intravenous Every 6 hours 04/22/13 1750 04/23/13 0629   04/22/13 0831  cefOXitin (MEFOXIN) 2 g in dextrose 5 % 50 mL IVPB     2 g 100 mL/hr over 30 Minutes Intravenous On call to O.R. 04/22/13 0831 04/22/13 1113      Assessment Active Problems:   H/O diverticulitis of SIGMOID and left colon s/p laparoscopic assisted left colectomy 04/22/13-slight return of bowel function  Depression-meds held   HYPOTHYROIDISM   ASTHMA   DM Type II-on SSI with CBGs 109-148  ABL anemia     LOS: 2 days   Plan: Change PCA to reduced dose.  Full liquid diet.  Ambulate.   [name] 04/24/2013

## 2013-04-25 LAB — GLUCOSE, CAPILLARY
Glucose-Capillary: 108 mg/dL — ABNORMAL HIGH (ref 70–99)
Glucose-Capillary: 102 mg/dL — ABNORMAL HIGH (ref 70–99)
Glucose-Capillary: 116 mg/dL — ABNORMAL HIGH (ref 70–99)

## 2013-04-25 LAB — CBC
HCT: 27 % — ABNORMAL LOW (ref 36.0–46.0)
MCHC: 31.5 g/dL (ref 30.0–36.0)
Platelets: 175 10*3/uL (ref 150–400)
RDW: 14.7 % (ref 11.5–15.5)
WBC: 11.8 10*3/uL — ABNORMAL HIGH (ref 4.0–10.5)

## 2013-04-25 MED ORDER — OXYCODONE HCL 5 MG PO TABS
5.0000 mg | ORAL_TABLET | ORAL | Status: DC | PRN
  Administered 2013-04-25 (×2): 5 mg via ORAL
  Administered 2013-04-26 – 2013-05-01 (×15): 10 mg via ORAL
  Filled 2013-04-25 (×5): qty 2
  Filled 2013-04-25: qty 1
  Filled 2013-04-25 (×9): qty 2
  Filled 2013-04-25: qty 1
  Filled 2013-04-25: qty 2

## 2013-04-25 MED ORDER — INSULIN ASPART 100 UNIT/ML ~~LOC~~ SOLN
0.0000 [IU] | Freq: Three times a day (TID) | SUBCUTANEOUS | Status: DC
  Administered 2013-04-26 – 2013-04-29 (×5): 2 [IU] via SUBCUTANEOUS

## 2013-04-25 MED ORDER — DEXTROSE 5 % IV SOLN
1.0000 g | Freq: Two times a day (BID) | INTRAVENOUS | Status: DC
  Administered 2013-04-25 (×2): 1 g via INTRAVENOUS
  Filled 2013-04-25 (×4): qty 1

## 2013-04-25 NOTE — Progress Notes (Signed)
3 Days Post-Op  Subjective: Not having much pain.  Passing gas.  Tolerating full liquids.  Objective: Vital signs in last 24 hours: Temp:  [99.2 F (37.3 C)-100.3 F (37.9 C)] 99.2 F (37.3 C) (06/12 0617) Pulse Rate:  [73-81] 75 (06/12 0617) Resp:  [15-18] 18 (06/12 0715) BP: (123-134)/(78-82) 123/78 mmHg (06/12 0617) SpO2:  [98 %-100 %] 99 % (06/12 0715) Last BM Date: 04/24/13  Intake/Output from previous day: 06/11 0701 - 06/12 0700 In: 2395 [P.O.:120; I.V.:2275] Out: 270 [Urine:100; Drains:170] Intake/Output this shift:    PE: General- In NAD Abdomen-soft, lower midline wound has some erythema and increased warmth superiorly so two staples removed and a seroma was drained, drain output is thin and serosanguinous  Lab Results:   Recent Labs  04/24/13 0437 04/25/13 0538  WBC 12.1* 11.8*  HGB 9.5* 8.5*  HCT 29.9* 27.0*  PLT 173 175   BMET  Recent Labs  04/23/13 0433  NA 136  K 3.9  CL 101  CO2 31  GLUCOSE 157*  BUN 8  CREATININE 0.74  CALCIUM 8.1*   PT/INR No results found for this basename: LABPROT, INR,  in the last 72 hours Comprehensive Metabolic Panel:    Component Value Date/Time   NA 136 04/23/2013 0433   K 3.9 04/23/2013 0433   CL 101 04/23/2013 0433   CO2 31 04/23/2013 0433   BUN 8 04/23/2013 0433   CREATININE 0.74 04/23/2013 0433   CREATININE 1.10 12/04/2012 1015   GLUCOSE 157* 04/23/2013 0433   CALCIUM 8.1* 04/23/2013 0433   AST 23 04/15/2013 1530   ALT 20 04/15/2013 1530   ALKPHOS 77 04/15/2013 1530   BILITOT 0.1* 04/15/2013 1530   PROT 6.7 04/15/2013 1530   ALBUMIN 3.2* 04/15/2013 1530     Studies/Results: No results found.  Anti-infectives: Anti-infectives   Start     Dose/Rate Route Frequency Ordered Stop   04/22/13 2200  cefoTEtan (CEFOTAN) 2 g in dextrose 5 % 50 mL IVPB  Status:  Discontinued     2 g 100 mL/hr over 30 Minutes Intravenous Every 12 hours 04/22/13 1735 04/22/13 1748   04/22/13 1830  cefOXitin (MEFOXIN) 2 g in dextrose 5 %  50 mL IVPB     2 g 100 mL/hr over 30 Minutes Intravenous Every 6 hours 04/22/13 1750 04/23/13 0629   04/22/13 0831  cefOXitin (MEFOXIN) 2 g in dextrose 5 % 50 mL IVPB     2 g 100 mL/hr over 30 Minutes Intravenous On call to O.R. 04/22/13 0831 04/22/13 1113      Assessment Active Problems:   H/O diverticulitis of SIGMOID and left colon s/p laparoscopic assisted left colectomy 04/22/13-slowly improving  Superficial wound infection-risk factors are obesity, DM, colon surgery; part of wound opened and drained.  Depression-meds held   HYPOTHYROIDISM   ASTHMA   DM Type II-on SSI with CBGs 111-130  ABL anemia-hgb down again this AM. HD stable.     LOS: 3 days   Plan:  Stop Heparin.  Start antibiotic. Wound care.  Advance diet.  Stop PCA. [name] J 04/25/2013

## 2013-04-25 NOTE — Progress Notes (Signed)
Encouraged throughout shift to walk in hall. Pt up ad lib in room intermittently today. Pt reported " I just feel too weak to walk that far." Pt aware hemoglobin running low; stated "The doctor and I discussed my lab work this morning." Reassured pt. Sitting in chair at present.

## 2013-04-26 LAB — CBC
HCT: 30.1 % — ABNORMAL LOW (ref 36.0–46.0)
Hemoglobin: 9.4 g/dL — ABNORMAL LOW (ref 12.0–15.0)
MCHC: 31.2 g/dL (ref 30.0–36.0)
RDW: 14.4 % (ref 11.5–15.5)
WBC: 11.5 10*3/uL — ABNORMAL HIGH (ref 4.0–10.5)

## 2013-04-26 LAB — GLUCOSE, CAPILLARY
Glucose-Capillary: 117 mg/dL — ABNORMAL HIGH (ref 70–99)
Glucose-Capillary: 111 mg/dL — ABNORMAL HIGH (ref 70–99)

## 2013-04-26 MED ORDER — ZILEUTON ER 600 MG PO TB12: 1200.0000 mg | ORAL_TABLET | Freq: Every morning | ORAL | Status: DC

## 2013-04-26 MED ORDER — PANTOPRAZOLE SODIUM 40 MG PO TBEC
40.0000 mg | DELAYED_RELEASE_TABLET | Freq: Every day | ORAL | Status: DC
  Administered 2013-04-26 – 2013-05-01 (×6): 40 mg via ORAL
  Filled 2013-04-26 (×6): qty 1

## 2013-04-26 MED ORDER — DEXTROSE 5 % IV SOLN
2.0000 g | Freq: Four times a day (QID) | INTRAVENOUS | Status: DC
  Administered 2013-04-26 – 2013-04-27 (×5): 2 g via INTRAVENOUS
  Filled 2013-04-26 (×7): qty 2

## 2013-04-26 MED ORDER — HEPARIN SODIUM (PORCINE) 5000 UNIT/ML IJ SOLN
5000.0000 [IU] | Freq: Three times a day (TID) | INTRAMUSCULAR | Status: DC
  Administered 2013-04-26: 5000 [IU] via SUBCUTANEOUS
  Filled 2013-04-26 (×4): qty 1

## 2013-04-26 MED ORDER — MONTELUKAST SODIUM 10 MG PO TABS
10.0000 mg | ORAL_TABLET | Freq: Every day | ORAL | Status: DC
  Administered 2013-04-26 – 2013-04-30 (×5): 10 mg via ORAL
  Filled 2013-04-26 (×6): qty 1

## 2013-04-26 MED ORDER — VANCOMYCIN HCL 10 G IV SOLR
2000.0000 mg | Freq: Once | INTRAVENOUS | Status: AC
  Administered 2013-04-26: 2000 mg via INTRAVENOUS
  Filled 2013-04-26: qty 2000

## 2013-04-26 MED ORDER — VANCOMYCIN HCL 10 G IV SOLR
1250.0000 mg | Freq: Two times a day (BID) | INTRAVENOUS | Status: DC
  Administered 2013-04-27 (×2): 1250 mg via INTRAVENOUS
  Filled 2013-04-26 (×3): qty 1250

## 2013-04-26 MED ORDER — TRAZODONE HCL 150 MG PO TABS
150.0000 mg | ORAL_TABLET | Freq: Every day | ORAL | Status: DC
  Administered 2013-04-26 – 2013-04-30 (×5): 150 mg via ORAL
  Filled 2013-04-26 (×6): qty 1

## 2013-04-26 MED ORDER — HEPARIN SODIUM (PORCINE) 5000 UNIT/ML IJ SOLN
5000.0000 [IU] | Freq: Three times a day (TID) | INTRAMUSCULAR | Status: DC
  Administered 2013-04-26 – 2013-05-01 (×14): 5000 [IU] via SUBCUTANEOUS
  Filled 2013-04-26 (×18): qty 1

## 2013-04-26 MED ORDER — ESCITALOPRAM OXALATE 10 MG PO TABS
10.0000 mg | ORAL_TABLET | Freq: Every day | ORAL | Status: DC
  Administered 2013-04-26 – 2013-04-30 (×5): 10 mg via ORAL
  Filled 2013-04-26 (×6): qty 1

## 2013-04-26 NOTE — Progress Notes (Signed)
4 Days Post-Op  Subjective: Not much of an appetite.  Passing a little gas.  Pain is better.  Objective: Vital signs in last 24 hours: Temp:  [98.3 F (36.8 C)-98.8 F (37.1 C)] 98.3 F (36.8 C) (06/13 0540) Pulse Rate:  [74-96] 82 (06/13 0540) Resp:  [16-18] 18 (06/13 0540) BP: (102-156)/(64-86) 147/86 mmHg (06/13 0540) SpO2:  [92 %-98 %] 95 % (06/13 0540) Last BM Date: 04/24/13  Intake/Output from previous day: 06/12 0701 - 06/13 0700 In: 1545 [P.O.:120; I.V.:1425] Out: 1130 [Urine:900; Drains:230] Intake/Output this shift:    PE: General- In NAD Abdomen-soft, lower midline wound has increased erythema laterally so more staples were removed and wound was packed with moist gauze, drain output is thin and serous, active bowel sounds  Lab Results:   Recent Labs  04/25/13 0538 04/26/13 0440  WBC 11.8* 11.5*  HGB 8.5* 9.4*  HCT 27.0* 30.1*  PLT 175 245   BMET No results found for this basename: NA, K, CL, CO2, GLUCOSE, BUN, CREATININE, CALCIUM,  in the last 72 hours PT/INR No results found for this basename: LABPROT, INR,  in the last 72 hours Comprehensive Metabolic Panel:    Component Value Date/Time   NA 136 04/23/2013 0433   K 3.9 04/23/2013 0433   CL 101 04/23/2013 0433   CO2 31 04/23/2013 0433   BUN 8 04/23/2013 0433   CREATININE 0.74 04/23/2013 0433   CREATININE 1.10 12/04/2012 1015   GLUCOSE 157* 04/23/2013 0433   CALCIUM 8.1* 04/23/2013 0433   AST 23 04/15/2013 1530   ALT 20 04/15/2013 1530   ALKPHOS 77 04/15/2013 1530   BILITOT 0.1* 04/15/2013 1530   PROT 6.7 04/15/2013 1530   ALBUMIN 3.2* 04/15/2013 1530     Studies/Results: No results found.  Anti-infectives: Anti-infectives   Start     Dose/Rate Route Frequency Ordered Stop   04/25/13 1100  cefoTEtan (CEFOTAN) 1 g in dextrose 5 % 50 mL IVPB     1 g 100 mL/hr over 30 Minutes Intravenous Every 12 hours 04/25/13 0957     04/22/13 2200  cefoTEtan (CEFOTAN) 2 g in dextrose 5 % 50 mL IVPB  Status:  Discontinued      2 g 100 mL/hr over 30 Minutes Intravenous Every 12 hours 04/22/13 1735 04/22/13 1748   04/22/13 1830  cefOXitin (MEFOXIN) 2 g in dextrose 5 % 50 mL IVPB     2 g 100 mL/hr over 30 Minutes Intravenous Every 6 hours 04/22/13 1750 04/23/13 0629   04/22/13 0831  cefOXitin (MEFOXIN) 2 g in dextrose 5 % 50 mL IVPB     2 g 100 mL/hr over 30 Minutes Intravenous On call to O.R. 04/22/13 0831 04/22/13 1113      Assessment Active Problems:   H/O diverticulitis of SIGMOID and left colon s/p laparoscopic assisted left colectomy 04/22/13-slowly improving  Superficial wound infection-risk factors are obesity, DM, colon surgery; more  of wound opened; on Cefotan.  Depression-meds held   HYPOTHYROIDISM   ASTHMA   DM Type II-on SSI with CBGs 102-130  ABL anemia-hgb up this AM. HD stable.     LOS: 4 days   Plan:  Add Vancomycin.  Damp to dry dressing changes to open part of wound.  Restart heparin. [name] 04/26/2013

## 2013-04-26 NOTE — Progress Notes (Signed)
Met with patient at bedside on behalf of [facility] [employer] program for [employer] employees with [employer]. [name] is known to this Clinical research associate from previous admissions. She reports she has received calls from [facility]. However, she states she will follow up after discharge. Reports she plans to return home at discharge. Left contact number for patient at bedside.  Hal Morales- [facility] [phone]

## 2013-04-26 NOTE — Progress Notes (Signed)
ANTIBIOTIC CONSULT NOTE - INITIAL  Pharmacy Consult for Vancomycin Indication: wound infection s/p colectomy 6/9  Allergies  Allergen Reactions  . Levofloxacin Anaphylaxis    Possibly Levaquin or Lisinopril, pt was taking both at the time of reaction  . Lisinopril Anaphylaxis    Possibly Lisinopril or Levaquin, pt was taking both at the time of reaction  . Bee Venom Swelling  . Morphine And Related Itching    Needs benadryl prior to     Patient Measurements: Height: 5' 5.5" (166.4 cm) Weight: 270 lb (122.471 kg) IBW/kg (Calculated) : 58.15  Vital Signs: Temp: 98.3 F (36.8 C) (06/13 0540) Temp src: Oral (06/13 0540) BP: 147/86 mmHg (06/13 0540) Pulse Rate: 82 (06/13 0540) Intake/Output from previous day: 06/12 0701 - 06/13 0700 In: 1545 [P.O.:120; I.V.:1425] Out: 1130 [Urine:900; Drains:230] Intake/Output from this shift: Total I/O In: -  Out: 120 [Drains:120]  Labs:  Recent Labs  04/24/13 0437 04/25/13 0538 04/26/13 0440  WBC 12.1* 11.8* 11.5*  HGB 9.5* 8.5* 9.4*  PLT 173 175 245   Estimated Creatinine Clearance: 99 ml/min (by C-G formula based on Cr of 0.74). No results found for this basename: VANCOTROUGH, VANCOPEAK, VANCORANDOM, GENTTROUGH, GENTPEAK, GENTRANDOM, TOBRATROUGH, TOBRAPEAK, TOBRARND, AMIKACINPEAK, AMIKACINTROU, AMIKACIN,  in the last 72 hours   Medical History: Past Medical History  Diagnosis Date  . Asthma   . Diverticulitis   . Diverticulosis   . Thyroid disease     hypo  . Hypertension   . Diabetes mellitus     not on meds.  . Hypothyroidism   . Migraine   . Anemia     none recently  . Depression     Assessment: 68 yof with recurrent episodes of descending and sigmoid diverticulitis s/p laparoscopic assisted L colectomy 6/9.  Superficial wound infection noted 6/12 and cefotetan started. Today, 6/13, MD is adding Vancomycin. Patient with h/o DM and obesity.   Patient is afebrile, WBC 11.5K, Scr 0.74 on 6/10 for CG CrCl 99 ml/min  and normalized CrCl of 85 ml/min.   No cultures  Goal of Therapy:  Vancomycin trough level 15-20 mcg/ml  Plan:   Vancomycin 2000 mg IV x 1 as loading dose then 1250 mg IV q12h.   Continue Cefotetan 1gm IV q12h as ordered by MD  Pharmacy will f/u  Geoffry Paradise, PharmD, BCPS Pager: [phone] 8:32 AM Pharmacy #: [id number]

## 2013-04-26 NOTE — Progress Notes (Deleted)
04/26/13 1400 Called report to [facility] care [phone]. Pt going to room #112.

## 2013-04-27 LAB — GLUCOSE, CAPILLARY: Glucose-Capillary: 120 mg/dL — ABNORMAL HIGH (ref 70–99)

## 2013-04-27 LAB — CREATININE, SERUM: GFR calc non Af Amer: 90 mL/min — ABNORMAL LOW (ref >90)

## 2013-04-27 MED ORDER — VANCOMYCIN HCL 10 G IV SOLR
1250.0000 mg | Freq: Two times a day (BID) | INTRAVENOUS | Status: DC
  Administered 2013-04-28 – 2013-05-01 (×7): 1250 mg via INTRAVENOUS
  Filled 2013-04-27 (×8): qty 1250

## 2013-04-27 MED ORDER — DEXTROSE 5 % IV SOLN
2.0000 g | Freq: Four times a day (QID) | INTRAVENOUS | Status: DC
  Administered 2013-04-27 – 2013-05-01 (×16): 2 g via INTRAVENOUS
  Filled 2013-04-27 (×18): qty 2

## 2013-04-27 NOTE — Progress Notes (Signed)
Patient ID: [name], female   DOB: 1951-10-04, 62 y.o.   MRN: [id number] [facility] Surgery Progress Note:   5 Days Post-Op  Subjective: Mental status is clear.   Objective: Vital signs in last 24 hours: Temp:  [98.9 F (37.2 C)-99.3 F (37.4 C)] 98.9 F (37.2 C) (06/14 0520) Pulse Rate:  [74-95] 95 (06/14 1014) Resp:  [18] 18 (06/14 0520) BP: (116-160)/(75-111) 140/85 mmHg (06/14 1014) SpO2:  [93 %-96 %] 93 % (06/14 0520)  Intake/Output from previous day: 06/13 0701 - 06/14 0700 In: 1768.3 [P.O.:120; I.V.:1198.3; IV Piggyback:450] Out: 1935 [Urine:1350; Drains:585] Intake/Output this shift: Total I/O In: -  Out: 525 [Urine:375; Drains:150]  Physical Exam: Work of breathing is normal.  Incision is not erythematous.  Repacked and will order VAC.  JP is serosanguinous.    Lab Results:  Results for orders placed during the hospital encounter of 04/22/13 (from the past 48 hour(s))  GLUCOSE, CAPILLARY     Status: Abnormal   Collection Time    04/25/13 12:05 PM      Result Value Range   Glucose-Capillary 108 (*) 70 - 99 mg/dL  GLUCOSE, CAPILLARY     Status: Abnormal   Collection Time    04/25/13  4:50 PM      Result Value Range   Glucose-Capillary 102 (*) 70 - 99 mg/dL  GLUCOSE, CAPILLARY     Status: Abnormal   Collection Time    04/25/13  9:50 PM      Result Value Range   Glucose-Capillary 116 (*) 70 - 99 mg/dL  CBC     Status: Abnormal   Collection Time    04/26/13  4:40 AM      Result Value Range   WBC 11.5 (*) 4.0 - 10.5 K/uL   RBC 3.48 (*) 3.87 - 5.11 MIL/uL   Hemoglobin 9.4 (*) 12.0 - 15.0 g/dL   HCT 40.9 (*) 81.1 - 91.4 %   MCV 86.5  78.0 - 100.0 fL   MCH 27.0  26.0 - 34.0 pg   MCHC 31.2  30.0 - 36.0 g/dL   RDW 78.2  95.6 - 21.3 %   Platelets 245  150 - 400 K/uL   Comment: DELTA CHECK NOTED     REPEATED TO VERIFY  GLUCOSE, CAPILLARY     Status: Abnormal   Collection Time    04/26/13  8:14 AM      Result Value Range   Glucose-Capillary 117  (*) 70 - 99 mg/dL   Comment 1 Notify RN    GLUCOSE, CAPILLARY     Status: Abnormal   Collection Time    04/26/13  1:03 PM      Result Value Range   Glucose-Capillary 139 (*) 70 - 99 mg/dL  GLUCOSE, CAPILLARY     Status: Abnormal   Collection Time    04/26/13  5:04 PM      Result Value Range   Glucose-Capillary 104 (*) 70 - 99 mg/dL  GLUCOSE, CAPILLARY     Status: Abnormal   Collection Time    04/26/13  9:21 PM      Result Value Range   Glucose-Capillary 111 (*) 70 - 99 mg/dL  CREATININE, SERUM     Status: Abnormal   Collection Time    04/27/13  4:55 AM      Result Value Range   Creatinine, Ser 0.76  0.50 - 1.10 mg/dL   GFR calc non Af Amer 90 (*) >90 mL/min   GFR  calc Af Amer >90  >90 mL/min   Comment:            The eGFR has been calculated     using the CKD EPI equation.     This calculation has not been     validated in all clinical     situations.     eGFR's persistently     <90 mL/min signify     possible Chronic Kidney Disease.  GLUCOSE, CAPILLARY     Status: Abnormal   Collection Time    04/27/13  7:55 AM      Result Value Range   Glucose-Capillary 120 (*) 70 - 99 mg/dL    Radiology/Results: No results found.  Anti-infectives: Anti-infectives   Start     Dose/Rate Route Frequency Ordered Stop   04/27/13 0000  vancomycin (VANCOCIN) 1,250 mg in sodium chloride 0.9 % 250 mL IVPB     1,250 mg 166.7 mL/hr over 90 Minutes Intravenous Every 12 hours 04/26/13 0840     04/26/13 1400  cefOXitin (MEFOXIN) 2 g in dextrose 5 % 50 mL IVPB     2 g 100 mL/hr over 30 Minutes Intravenous 4 times per day 04/26/13 1237     04/26/13 0900  vancomycin (VANCOCIN) 2,000 mg in sodium chloride 0.9 % 500 mL IVPB     2,000 mg 250 mL/hr over 120 Minutes Intravenous  Once 04/26/13 0840 04/26/13 1119   04/25/13 1100  cefoTEtan (CEFOTAN) 1 g in dextrose 5 % 50 mL IVPB  Status:  Discontinued     1 g 100 mL/hr over 30 Minutes Intravenous Every 12 hours 04/25/13 0957 04/26/13 1236    04/22/13 2200  cefoTEtan (CEFOTAN) 2 g in dextrose 5 % 50 mL IVPB  Status:  Discontinued     2 g 100 mL/hr over 30 Minutes Intravenous Every 12 hours 04/22/13 1735 04/22/13 1748   04/22/13 1830  cefOXitin (MEFOXIN) 2 g in dextrose 5 % 50 mL IVPB     2 g 100 mL/hr over 30 Minutes Intravenous Every 6 hours 04/22/13 1750 04/23/13 0629   04/22/13 0831  cefOXitin (MEFOXIN) 2 g in dextrose 5 % 50 mL IVPB     2 g 100 mL/hr over 30 Minutes Intravenous On call to O.R. 04/22/13 0831 04/22/13 1113      Assessment/Plan: Problem List: Patient Active Problem List   Diagnosis Date Noted  . H/O diverticulitis of SIGMOID and left colon s/p laparoscopic assisted left colectomy 04/22/13 02/15/2013  . Diverticulitis of proximal descending colon 02/07/2013  . Hypokalemia 01/26/2013  . Type 2 diabetes mellitus 08/13/2012  . Morbid obesity 08/13/2012  . Insomnia 08/13/2012  . History of ulcerative colitis 08/13/2012  . Musculoskeletal pain 08/13/2012  . Constipation 02/07/2012  . Hyperlipidemia 02/07/2012  . History of migraine headaches 02/07/2012  . Lichen planus 02/07/2012  . Allergic rhinitis 02/07/2012  . Angioedema 02/07/2012  . GERD 02/26/2010  . VITAMIN B12 DEFICIENCY 05/05/2008  . HYPOTHYROIDISM 04/29/2008  . DEPRESSION 04/29/2008  . HYPERTENSION 04/29/2008  . ASTHMA 04/29/2008  . DIVERTICULOSIS, COLON 12/11/2006  . H/O: hysterectomy 02/15/1998    Incision infection treated yesterday with opening and packing and this seems to be controlling problem.  Will get PIC line for antibiotics and wound vac.   5 Days Post-Op    LOS: 5 days   Matt B. Daphine Deutscher, MD, [facility] [phone] beeper [phone]  04/27/2013 10:21 AM

## 2013-04-28 LAB — GLUCOSE, CAPILLARY
Glucose-Capillary: 118 mg/dL — ABNORMAL HIGH (ref 70–99)
Glucose-Capillary: 125 mg/dL — ABNORMAL HIGH (ref 70–99)

## 2013-04-28 MED ORDER — SODIUM CHLORIDE 0.9 % IJ SOLN
10.0000 mL | Freq: Two times a day (BID) | INTRAMUSCULAR | Status: DC
  Administered 2013-04-30: 10 mL

## 2013-04-28 MED ORDER — SODIUM CHLORIDE 0.9 % IJ SOLN
10.0000 mL | INTRAMUSCULAR | Status: DC | PRN
  Administered 2013-05-01: 10 mL

## 2013-04-28 NOTE — Progress Notes (Signed)
Patient ID: [name], female   DOB: [birth year], 62 y.o.   MRN: [id number] [facility] Surgery Progress Note:   6 Days Post-Op  Subjective: Mental status is clear.  No complaints.  Wound VAC placed yesterday and she is tolerating it well Objective: Vital signs in last 24 hours: Temp:  [98.5 F (36.9 C)-98.9 F (37.2 C)] 98.5 F (36.9 C) (06/15 0500) Pulse Rate:  [70-95] 85 (06/15 0500) Resp:  [18-20] 18 (06/15 0500) BP: (114-140)/(69-85) 122/73 mmHg (06/15 0500) SpO2:  [95 %-100 %] 95 % (06/15 0500)  Intake/Output from previous day: 06/14 0701 - 06/15 0700 In: 400 [I.V.:400] Out: 1325 [Urine:925; Drains:400] Intake/Output this shift:    Physical Exam: Work of breathing is not labored.  Sitting up on the couch.  JP is serosanguinous.  Minimal pedal edema.  VAC in place.    Lab Results:  Results for orders placed during the hospital encounter of 04/22/13 (from the past 48 hour(s))  GLUCOSE, CAPILLARY     Status: Abnormal   Collection Time    04/26/13  1:03 PM      Result Value Range   Glucose-Capillary 139 (*) 70 - 99 mg/dL  GLUCOSE, CAPILLARY     Status: Abnormal   Collection Time    04/26/13  5:04 PM      Result Value Range   Glucose-Capillary 104 (*) 70 - 99 mg/dL  GLUCOSE, CAPILLARY     Status: Abnormal   Collection Time    04/26/13  9:21 PM      Result Value Range   Glucose-Capillary 111 (*) 70 - 99 mg/dL  CREATININE, SERUM     Status: Abnormal   Collection Time    04/27/13  4:55 AM      Result Value Range   Creatinine, Ser 0.76  0.50 - 1.10 mg/dL   GFR calc non Af Amer 90 (*) >90 mL/min   GFR calc Af Amer >90  >90 mL/min   Comment:            The eGFR has been calculated     using the CKD EPI equation.     This calculation has not been     validated in all clinical     situations.     eGFR's persistently     <90 mL/min signify     possible Chronic Kidney Disease.  GLUCOSE, CAPILLARY     Status: Abnormal   Collection Time    04/27/13  7:55 AM       Result Value Range   Glucose-Capillary 120 (*) 70 - 99 mg/dL  GLUCOSE, CAPILLARY     Status: Abnormal   Collection Time    04/27/13 12:25 PM      Result Value Range   Glucose-Capillary 136 (*) 70 - 99 mg/dL  GLUCOSE, CAPILLARY     Status: None   Collection Time    04/27/13  5:35 PM      Result Value Range   Glucose-Capillary 79  70 - 99 mg/dL  GLUCOSE, CAPILLARY     Status: Abnormal   Collection Time    04/27/13  9:17 PM      Result Value Range   Glucose-Capillary 118 (*) 70 - 99 mg/dL    Radiology/Results: No results found.  Anti-infectives: Anti-infectives   Start     Dose/Rate Route Frequency Ordered Stop   04/28/13 0400  vancomycin (VANCOCIN) 1,250 mg in sodium chloride 0.9 % 250 mL IVPB     1,250 mg  166.7 mL/hr over 90 Minutes Intravenous Every 12 hours 04/27/13 1655     04/27/13 2000  cefOXitin (MEFOXIN) 2 g in dextrose 5 % 50 mL IVPB     2 g 100 mL/hr over 30 Minutes Intravenous 4 times per day 04/27/13 1656     04/27/13 0000  vancomycin (VANCOCIN) 1,250 mg in sodium chloride 0.9 % 250 mL IVPB  Status:  Discontinued     1,250 mg 166.7 mL/hr over 90 Minutes Intravenous Every 12 hours 04/26/13 0840 04/27/13 1655   04/26/13 1400  cefOXitin (MEFOXIN) 2 g in dextrose 5 % 50 mL IVPB  Status:  Discontinued     2 g 100 mL/hr over 30 Minutes Intravenous 4 times per day 04/26/13 1237 04/27/13 1656   04/26/13 0900  vancomycin (VANCOCIN) 2,000 mg in sodium chloride 0.9 % 500 mL IVPB     2,000 mg 250 mL/hr over 120 Minutes Intravenous  Once 04/26/13 0840 04/26/13 1119   04/25/13 1100  cefoTEtan (CEFOTAN) 1 g in dextrose 5 % 50 mL IVPB  Status:  Discontinued     1 g 100 mL/hr over 30 Minutes Intravenous Every 12 hours 04/25/13 0957 04/26/13 1236   04/22/13 2200  cefoTEtan (CEFOTAN) 2 g in dextrose 5 % 50 mL IVPB  Status:  Discontinued     2 g 100 mL/hr over 30 Minutes Intravenous Every 12 hours 04/22/13 1735 04/22/13 1748   04/22/13 1830  cefOXitin (MEFOXIN) 2 g in  dextrose 5 % 50 mL IVPB     2 g 100 mL/hr over 30 Minutes Intravenous Every 6 hours 04/22/13 1750 04/23/13 0629   04/22/13 0831  cefOXitin (MEFOXIN) 2 g in dextrose 5 % 50 mL IVPB     2 g 100 mL/hr over 30 Minutes Intravenous On call to O.R. 04/22/13 0831 04/22/13 1113      Assessment/Plan: Problem List: Patient Active Problem List   Diagnosis Date Noted  . H/O diverticulitis of SIGMOID and left colon s/p laparoscopic assisted left colectomy 04/22/13 02/15/2013  . Diverticulitis of proximal descending colon 02/07/2013  . Hypokalemia 01/26/2013  . Type 2 diabetes mellitus 08/13/2012  . Morbid obesity 08/13/2012  . Insomnia 08/13/2012  . History of ulcerative colitis 08/13/2012  . Musculoskeletal pain 08/13/2012  . Constipation 02/07/2012  . Hyperlipidemia 02/07/2012  . History of migraine headaches 02/07/2012  . Lichen planus 02/07/2012  . Allergic rhinitis 02/07/2012  . Angioedema 02/07/2012  . GERD 02/26/2010  . VITAMIN B12 DEFICIENCY 05/05/2008  . HYPOTHYROIDISM 04/29/2008  . DEPRESSION 04/29/2008  . HYPERTENSION 04/29/2008  . ASTHMA 04/29/2008  . DIVERTICULOSIS, COLON 12/11/2006  . H/O: hysterectomy 02/15/1998    Taking PO.  Doing well with wound VAC.   6 Days Post-Op    LOS: 6 days   Matt B. Daphine Deutscher, MD, [facility] [phone] beeper [phone]  04/28/2013 8:39 AM

## 2013-04-29 LAB — CBC
Hemoglobin: 8.8 g/dL — ABNORMAL LOW (ref 12.0–15.0)
MCH: 28.2 pg (ref 26.0–34.0)
MCV: 85.6 fL (ref 78.0–100.0)
RBC: 3.12 MIL/uL — ABNORMAL LOW (ref 3.87–5.11)

## 2013-04-29 NOTE — Consult Note (Signed)
WOC consult Note Reason for Consult:Change midline incision dressing at superior aspect, evaluate for NPWT. Wound type:Dehisced surgical incision Pressure Ulcer POA: No Measurement:7cm x 2.5cm x 1.5cm Wound UXL:KGMWN, pink, moist Drainage (amount, consistency, odor) serosanguinous, no odor Periwound: Note that distal end of midline (beneath wound) is erythematous and warm to the touch. Staples intact, no drainage between staples noted. Dressing procedure/placement/frequency:NPWT three times weekly on M-W-F.  negative pressure tolerated well, seal achieved immediately. One piece of black foam used to fill defect. Education provided about NPWT and patient is agreeable with this POC for discharge. ASK CCS MD to evaluate distal end of incision tomorrow. I will not follow, but will remain available to this lovely woman and her surgical and nursing teams. Please re-consult if needed . Thanks, Ladona Mow, MSN, RN, [facility], CWOCN [phone])

## 2013-04-29 NOTE — Progress Notes (Signed)
ANTIBIOTIC CONSULT NOTE - Follow up  Pharmacy Consult for Vancomycin Indication: wound infection s/p colectomy 6/9  Allergies  Allergen Reactions  . Levofloxacin Anaphylaxis    Possibly Levaquin or Lisinopril, pt was taking both at the time of reaction  . Lisinopril Anaphylaxis    Possibly Lisinopril or Levaquin, pt was taking both at the time of reaction  . Bee Venom Swelling  . Morphine And Related Itching    Needs benadryl prior to     Patient Measurements: Height: 5' 5.5" (166.4 cm) Weight: 270 lb (122.471 kg) IBW/kg (Calculated) : 58.15  Vital Signs: Temp: 98.2 F (36.8 C) (06/16 0600) Temp src: Oral (06/16 0600) BP: 128/84 mmHg (06/16 0600) Pulse Rate: 75 (06/16 0600) Intake/Output from previous day: 06/15 0701 - 06/16 0700 In: 2050 [I.V.:1700; IV Piggyback:350] Out: 1970 [Urine:1350; Drains:620] Intake/Output from this shift: Total I/O In: -  Out: 150 [Drains:150]  Labs:  Recent Labs  04/27/13 0455  CREATININE 0.76   Estimated Creatinine Clearance: 99 ml/min (by C-G formula based on Cr of 0.76). No results found for this basename: VANCOTROUGH, VANCOPEAK, VANCORANDOM, GENTTROUGH, GENTPEAK, GENTRANDOM, TOBRATROUGH, TOBRAPEAK, TOBRARND, AMIKACINPEAK, AMIKACINTROU, AMIKACIN,  in the last 72 hours   Medical History: Past Medical History  Diagnosis Date  . Asthma   . Diverticulitis   . Diverticulosis   . Thyroid disease     hypo  . Hypertension   . Diabetes mellitus     not on meds.  . Hypothyroidism   . Migraine   . Anemia     none recently  . Depression     Assessment: 31 yof with recurrent episodes of descending and sigmoid diverticulitis s/p laparoscopic assisted L colectomy 6/9.  Superficial wound infection noted 6/12 and cefotetan started. Today, 6/13, MD is adding Vancomycin. Patient with h/o DM and obesity.   Day #4 Vanc and Cefoxitin(MD dosing).  Afebrile. WBCs slightly elevated on 6/13.  SCr wnl, CrCl 90-100.   No cultures.  Wound  improved and may be discharged home tomorrow per MD note.  Goal of Therapy:  Vancomycin trough level 15-20 mcg/ml  Plan:   Cont Vancomycin 1250 mg IV q12h.  Will defer Vanc trough since improving, renal function stable, and likely to be  discharged tomorrow.  Agree with Cefoxitin 2g IV q6h.   Pharmacy will f/u.  Charolotte Eke, PharmD, pager [phone]. 04/29/2013,8:53 AM.

## 2013-04-29 NOTE — Progress Notes (Signed)
04/29/2013 Colleen Can BSN RN CCM [phone] Awaiting orders for poss home wound vac if needed. CM will follow. Pt plans to return to her home in [place] where she lives with mother. States  her sister takes care of their mother and can offer her some support. Pt states she does not need RW. If [facility] services are needed she is requesting [facility]. Currrently not sure if she will need [facility] services for wound vac of home IV abx. CM will folllow. [facility] notified of possibility of HH services.

## 2013-04-29 NOTE — Progress Notes (Signed)
Patient ID: [name], female   DOB: [birth year], 62 y.o.   MRN: [id number] [facility] Surgery Progress Note:   7 Days Post-Op  Subjective: Mental status is clear.  Mobility has been somewhat limited by VAC.  PICC line placed Objective: Vital signs in last 24 hours: Temp:  [98.2 F (36.8 C)-98.3 F (36.8 C)] 98.2 F (36.8 C) (06/16 0600) Pulse Rate:  [72-80] 75 (06/16 0600) Resp:  [18] 18 (06/16 0600) BP: (106-153)/(72-84) 128/84 mmHg (06/16 0600) SpO2:  [96 %-97 %] 97 % (06/16 0600)  Intake/Output from previous day: 06/15 0701 - 06/16 0700 In: 2050 [I.V.:1700; IV Piggyback:350] Out: 1970 [Urine:1350; Drains:620] Intake/Output this shift:    Physical Exam: Work of breathing is not labored.  Incision skin not red.  VAC due to be changed today.  Have consulted wound care team to assess need for home [facility] and management at home because her sister will be helping her but she also cares for their demented mother.  JP serosanguinous.    Lab Results:  Results for orders placed during the hospital encounter of 04/22/13 (from the past 48 hour(s))  GLUCOSE, CAPILLARY     Status: Abnormal   Collection Time    04/27/13  7:55 AM      Result Value Range   Glucose-Capillary 120 (*) 70 - 99 mg/dL  GLUCOSE, CAPILLARY     Status: Abnormal   Collection Time    04/27/13 12:25 PM      Result Value Range   Glucose-Capillary 136 (*) 70 - 99 mg/dL  GLUCOSE, CAPILLARY     Status: None   Collection Time    04/27/13  5:35 PM      Result Value Range   Glucose-Capillary 79  70 - 99 mg/dL  GLUCOSE, CAPILLARY     Status: Abnormal   Collection Time    04/27/13  9:17 PM      Result Value Range   Glucose-Capillary 118 (*) 70 - 99 mg/dL  GLUCOSE, CAPILLARY     Status: Abnormal   Collection Time    04/28/13  8:37 AM      Result Value Range   Glucose-Capillary 119 (*) 70 - 99 mg/dL  GLUCOSE, CAPILLARY     Status: Abnormal   Collection Time    04/28/13 11:58 AM      Result Value Range   Glucose-Capillary 147 (*) 70 - 99 mg/dL  GLUCOSE, CAPILLARY     Status: Abnormal   Collection Time    04/28/13  5:12 PM      Result Value Range   Glucose-Capillary 107 (*) 70 - 99 mg/dL  GLUCOSE, CAPILLARY     Status: Abnormal   Collection Time    04/28/13  9:14 PM      Result Value Range   Glucose-Capillary 125 (*) 70 - 99 mg/dL    Radiology/Results: No results found.  Anti-infectives: Anti-infectives   Start     Dose/Rate Route Frequency Ordered Stop   04/28/13 0400  vancomycin (VANCOCIN) 1,250 mg in sodium chloride 0.9 % 250 mL IVPB     1,250 mg 166.7 mL/hr over 90 Minutes Intravenous Every 12 hours 04/27/13 1655     04/27/13 2000  cefOXitin (MEFOXIN) 2 g in dextrose 5 % 50 mL IVPB     2 g 100 mL/hr over 30 Minutes Intravenous 4 times per day 04/27/13 1656     04/27/13 0000  vancomycin (VANCOCIN) 1,250 mg in sodium chloride 0.9 % 250 mL IVPB  Status:  Discontinued     1,250 mg 166.7 mL/hr over 90 Minutes Intravenous Every 12 hours 04/26/13 0840 04/27/13 1655   04/26/13 1400  cefOXitin (MEFOXIN) 2 g in dextrose 5 % 50 mL IVPB  Status:  Discontinued     2 g 100 mL/hr over 30 Minutes Intravenous 4 times per day 04/26/13 1237 04/27/13 1656   04/26/13 0900  vancomycin (VANCOCIN) 2,000 mg in sodium chloride 0.9 % 500 mL IVPB     2,000 mg 250 mL/hr over 120 Minutes Intravenous  Once 04/26/13 0840 04/26/13 1119   04/25/13 1100  cefoTEtan (CEFOTAN) 1 g in dextrose 5 % 50 mL IVPB  Status:  Discontinued     1 g 100 mL/hr over 30 Minutes Intravenous Every 12 hours 04/25/13 0957 04/26/13 1236   04/22/13 2200  cefoTEtan (CEFOTAN) 2 g in dextrose 5 % 50 mL IVPB  Status:  Discontinued     2 g 100 mL/hr over 30 Minutes Intravenous Every 12 hours 04/22/13 1735 04/22/13 1748   04/22/13 1830  cefOXitin (MEFOXIN) 2 g in dextrose 5 % 50 mL IVPB     2 g 100 mL/hr over 30 Minutes Intravenous Every 6 hours 04/22/13 1750 04/23/13 0629   04/22/13 0831  cefOXitin (MEFOXIN) 2 g in dextrose 5 % 50 mL  IVPB     2 g 100 mL/hr over 30 Minutes Intravenous On call to O.R. 04/22/13 0831 04/22/13 1113      Assessment/Plan: Problem List: Patient Active Problem List   Diagnosis Date Noted  . H/O diverticulitis of SIGMOID and left colon s/p laparoscopic assisted left colectomy 04/22/13 02/15/2013  . Diverticulitis of proximal descending colon 02/07/2013  . Hypokalemia 01/26/2013  . Type 2 diabetes mellitus 08/13/2012  . Morbid obesity 08/13/2012  . Insomnia 08/13/2012  . History of ulcerative colitis 08/13/2012  . Musculoskeletal pain 08/13/2012  . Constipation 02/07/2012  . Hyperlipidemia 02/07/2012  . History of migraine headaches 02/07/2012  . Lichen planus 02/07/2012  . Allergic rhinitis 02/07/2012  . Angioedema 02/07/2012  . GERD 02/26/2010  . VITAMIN B12 DEFICIENCY 05/05/2008  . HYPOTHYROIDISM 04/29/2008  . DEPRESSION 04/29/2008  . HYPERTENSION 04/29/2008  . ASTHMA 04/29/2008  . DIVERTICULOSIS, COLON 12/11/2006  . H/O: hysterectomy 02/15/1998    Will check CBC today.  Wound care consult.  Possible discharge tomorrow.   7 Days Post-Op    LOS: 7 days   Matt B. Daphine Deutscher, MD, [facility] [phone] beeper [phone]  04/29/2013 7:47 AM

## 2013-04-30 LAB — GLUCOSE, CAPILLARY
Glucose-Capillary: 101 mg/dL — ABNORMAL HIGH (ref 70–99)
Glucose-Capillary: 119 mg/dL — ABNORMAL HIGH (ref 70–99)
Glucose-Capillary: 110 mg/dL — ABNORMAL HIGH (ref 70–99)
Glucose-Capillary: 107 mg/dL — ABNORMAL HIGH (ref 70–99)

## 2013-04-30 MED ORDER — FUROSEMIDE 40 MG PO TABS
40.0000 mg | ORAL_TABLET | Freq: Once | ORAL | Status: AC
  Administered 2013-04-30: 40 mg via ORAL
  Filled 2013-04-30: qty 1

## 2013-04-30 NOTE — Progress Notes (Signed)
Discussed in long length of stay rounds. 

## 2013-04-30 NOTE — Progress Notes (Signed)
Patient ID: [name], female   DOB: 1951-06-10, 62 y.o.   MRN: [id number] [facility] Progress Note:   8 Days Post-Op  Subjective: Mental status is clear.   Objective: Vital signs in last 24 hours: Temp:  [98 F (36.7 C)-99.2 F (37.3 C)] 99.2 F (37.3 C) (06/17 1027) Pulse Rate:  [64-86] 79 (06/17 1027) Resp:  [17-18] 17 (06/17 1027) BP: (112-136)/(47-73) 112/47 mmHg (06/17 1027) SpO2:  [96 %-98 %] 96 % (06/17 1027)  Intake/Output from previous day: 06/16 0701 - 06/17 0700 In: 2998.3 [P.O.:1080; I.V.:1218.3; IV Piggyback:700] Out: 1745 [Urine:1000; Drains:745] Intake/Output this shift: Total I/O In: -  Out: 30 [Drains:30]  Physical Exam: Work of breathing is normal.  JP serosanguinous.  Low grade temp of 99.  Normal WBC yesterday.    Lab Results:  Results for orders placed during the hospital encounter of 04/22/13 (from the past 48 hour(s))  GLUCOSE, CAPILLARY     Status: Abnormal   Collection Time    04/28/13 11:58 AM      Result Value Range   Glucose-Capillary 147 (*) 70 - 99 mg/dL  GLUCOSE, CAPILLARY     Status: Abnormal   Collection Time    04/28/13  5:12 PM      Result Value Range   Glucose-Capillary 107 (*) 70 - 99 mg/dL  GLUCOSE, CAPILLARY     Status: Abnormal   Collection Time    04/28/13  9:14 PM      Result Value Range   Glucose-Capillary 125 (*) 70 - 99 mg/dL  GLUCOSE, CAPILLARY     Status: Abnormal   Collection Time    04/29/13  8:26 AM      Result Value Range   Glucose-Capillary 112 (*) 70 - 99 mg/dL  CBC     Status: Abnormal   Collection Time    04/29/13 10:20 AM      Result Value Range   WBC 8.3  4.0 - 10.5 K/uL   RBC 3.12 (*) 3.87 - 5.11 MIL/uL   Hemoglobin 8.8 (*) 12.0 - 15.0 g/dL   HCT 40.9 (*) 81.1 - 91.4 %   MCV 85.6  78.0 - 100.0 fL   MCH 28.2  26.0 - 34.0 pg   MCHC 33.0  30.0 - 36.0 g/dL   RDW 78.2  95.6 - 21.3 %   Platelets 291  150 - 400 K/uL  GLUCOSE, CAPILLARY     Status: Abnormal   Collection Time   04/29/13 12:41 PM      Result Value Range   Glucose-Capillary 136 (*) 70 - 99 mg/dL  GLUCOSE, CAPILLARY     Status: None   Collection Time    04/29/13  5:19 PM      Result Value Range   Glucose-Capillary 90  70 - 99 mg/dL  GLUCOSE, CAPILLARY     Status: Abnormal   Collection Time    04/29/13  9:49 PM      Result Value Range   Glucose-Capillary 112 (*) 70 - 99 mg/dL  GLUCOSE, CAPILLARY     Status: Abnormal   Collection Time    04/30/13  8:05 AM      Result Value Range   Glucose-Capillary 101 (*) 70 - 99 mg/dL    Radiology/Results: No results found.  Anti-infectives: Anti-infectives   Start     Dose/Rate Route Frequency Ordered Stop   04/28/13 0400  vancomycin (VANCOCIN) 1,250 mg in sodium chloride 0.9 % 250 mL IVPB  1,250 mg 166.7 mL/hr over 90 Minutes Intravenous Every 12 hours 04/27/13 1655     04/27/13 2000  cefOXitin (MEFOXIN) 2 g in dextrose 5 % 50 mL IVPB     2 g 100 mL/hr over 30 Minutes Intravenous 4 times per day 04/27/13 1656     04/27/13 0000  vancomycin (VANCOCIN) 1,250 mg in sodium chloride 0.9 % 250 mL IVPB  Status:  Discontinued     1,250 mg 166.7 mL/hr over 90 Minutes Intravenous Every 12 hours 04/26/13 0840 04/27/13 1655   04/26/13 1400  cefOXitin (MEFOXIN) 2 g in dextrose 5 % 50 mL IVPB  Status:  Discontinued     2 g 100 mL/hr over 30 Minutes Intravenous 4 times per day 04/26/13 1237 04/27/13 1656   04/26/13 0900  vancomycin (VANCOCIN) 2,000 mg in sodium chloride 0.9 % 500 mL IVPB     2,000 mg 250 mL/hr over 120 Minutes Intravenous  Once 04/26/13 0840 04/26/13 1119   04/25/13 1100  cefoTEtan (CEFOTAN) 1 g in dextrose 5 % 50 mL IVPB  Status:  Discontinued     1 g 100 mL/hr over 30 Minutes Intravenous Every 12 hours 04/25/13 0957 04/26/13 1236   04/22/13 2200  cefoTEtan (CEFOTAN) 2 g in dextrose 5 % 50 mL IVPB  Status:  Discontinued     2 g 100 mL/hr over 30 Minutes Intravenous Every 12 hours 04/22/13 1735 04/22/13 1748   04/22/13 1830  cefOXitin  (MEFOXIN) 2 g in dextrose 5 % 50 mL IVPB     2 g 100 mL/hr over 30 Minutes Intravenous Every 6 hours 04/22/13 1750 04/23/13 0629   04/22/13 0831  cefOXitin (MEFOXIN) 2 g in dextrose 5 % 50 mL IVPB     2 g 100 mL/hr over 30 Minutes Intravenous On call to O.R. 04/22/13 0831 04/22/13 1113      Assessment/Plan: Problem List: Patient Active Problem List   Diagnosis Date Noted  . H/O diverticulitis of SIGMOID and left colon s/p laparoscopic assisted left colectomy 04/22/13 02/15/2013  . Diverticulitis of proximal descending colon 02/07/2013  . Hypokalemia 01/26/2013  . Type 2 diabetes mellitus 08/13/2012  . Morbid obesity 08/13/2012  . Insomnia 08/13/2012  . History of ulcerative colitis 08/13/2012  . Musculoskeletal pain 08/13/2012  . Constipation 02/07/2012  . Hyperlipidemia 02/07/2012  . History of migraine headaches 02/07/2012  . Lichen planus 02/07/2012  . Allergic rhinitis 02/07/2012  . Angioedema 02/07/2012  . GERD 02/26/2010  . VITAMIN B12 DEFICIENCY 05/05/2008  . HYPOTHYROIDISM 04/29/2008  . DEPRESSION 04/29/2008  . HYPERTENSION 04/29/2008  . ASTHMA 04/29/2008  . DIVERTICULOSIS, COLON 12/11/2006  . H/O: hysterectomy 02/15/1998    Not quite ready for discharge today.  Will get ready to go home on wound VAC and discontinue JP.   8 Days Post-Op    LOS: 8 days   Matt B. Daphine Deutscher, MD, [facility] [phone] beeper [phone]  04/30/2013 11:01 AM

## 2013-05-01 LAB — GLUCOSE, CAPILLARY: Glucose-Capillary: 123 mg/dL — ABNORMAL HIGH (ref 70–99)

## 2013-05-01 MED ORDER — OXYCODONE HCL 5 MG PO TABS: 5.0000 mg | ORAL_TABLET | ORAL | Status: DC | PRN

## 2013-05-01 NOTE — Discharge Summary (Signed)
Physician Discharge Summary  Patient ID: MALAYLA GRANBERRY MRN: [id number] DOB/AGE: 1950/12/04 62 y.o.  Admit date: 04/22/2013 Discharge date: 05/01/2013  Admission Diagnoses:  Diverticulitis recurrent  Discharge Diagnoses:  Same post colectomy  Principal Problem:   H/O diverticulitis of SIGMOID and left colon s/p laparoscopic assisted left colectomy 04/22/13 Active Problems:   HYPOTHYROIDISM   DEPRESSION   ASTHMA   Hyperlipidemia   Type 2 diabetes mellitus   Surgery:  Lap assisted sigmoid colectomy  Discharged Condition: improved  Hospital Course:   Had surgery.  Developed cellulitis in wound.  Wound opened and VAC applied.  Worked on mobility and removed JP prior to discharge.  To go home with VAC in place.  Consults: none  Significant Diagnostic Studies: non    Discharge Exam: Blood pressure 124/72, pulse 94, temperature 98.7 F (37.1 C), temperature source Oral, resp. rate 18, height 5' 5.5" (1.664 m), weight 270 lb (122.471 kg), SpO2 95.00%. VAC in place.  Staples to be removed prior to discharge  Disposition: 01-Home or Self Care  Discharge Orders   Future Orders Complete By Expires     Diet - low sodium heart healthy  As directed     Discharge wound care:  As directed     Comments:      VAC per [facility]    Increase activity slowly  As directed         Medication List    TAKE these medications       albuterol 108 (90 BASE) MCG/ACT inhaler  Commonly known as:  PROVENTIL HFA;VENTOLIN HFA  Inhale 2 puffs into the lungs every 6 (six) hours as needed for wheezing. For wheezing     ALPRAZolam 0.5 MG tablet  Commonly known as:  XANAX  Take 0.5 mg by mouth 2 (two) times daily as needed for sleep. For sleep/anxiety     aspirin 81 MG tablet  Take 81 mg by mouth daily.     aspirin-acetaminophen-caffeine 250-250-65 MG per tablet  Commonly known as:  EXCEDRIN MIGRAINE  Take 1 tablet by mouth every 6 (six) hours as needed. For pain/headache      AUGMENTIN PO  Take 1 tablet by mouth 2 (two) times daily.     cetirizine 10 MG tablet  Commonly known as:  ZYRTEC  Take 10 mg by mouth at bedtime.     EPIPEN 2-PAK 0.3 mg/0.3 mL Devi  Generic drug:  EPINEPHrine  Inject 0.3 mg into the muscle once. levaquin     escitalopram 10 MG tablet  Commonly known as:  LEXAPRO  Take 10 mg by mouth at bedtime.     esomeprazole 40 MG capsule  Commonly known as:  NEXIUM  Take 40 mg by mouth 2 (two) times daily.     famotidine 20 MG tablet  Commonly known as:  PEPCID  Take 20 mg by mouth at bedtime.     folic acid 1 MG tablet  Commonly known as:  FOLVITE  Take 1 mg by mouth daily.     furosemide 40 MG tablet  Commonly known as:  LASIX  Take 40 mg by mouth daily.     hyoscyamine 0.375 MG 12 hr tablet  Commonly known as:  LEVBID  Take 0.375 mg by mouth every morning.     levothyroxine 200 MCG tablet  Commonly known as:  SYNTHROID, LEVOTHROID  Take 200 mcg by mouth daily before breakfast.     losartan 50 MG tablet  Commonly known as:  COZAAR  Take 50 mg by mouth every morning.     metoprolol tartrate 25 MG tablet  Commonly known as:  LOPRESSOR  Take 25 mg by mouth every morning.     metroNIDAZOLE 500 MG tablet  Commonly known as:  FLAGYL  Take 1 tablet (500 mg total) by mouth every 8 (eight) hours.     oxyCODONE 5 MG immediate release tablet  Commonly known as:  Oxy IR/ROXICODONE  Take 1-2 tablets (5-10 mg total) by mouth every 4 (four) hours as needed.     polyethylene glycol powder powder  Commonly known as:  GLYCOLAX/MIRALAX  Take 17 g by mouth daily as needed (for constipation).     pyridOXINE 50 MG tablet  Commonly known as:  VITAMIN B-6  Take 50 mg by mouth daily.     simvastatin 10 MG tablet  Commonly known as:  ZOCOR  Take 10 mg by mouth at bedtime.     traZODone 150 MG tablet  Commonly known as:  DESYREL  Take 150 mg by mouth at bedtime.     vitamin B-12 100 MCG tablet  Commonly known as:  CYANOCOBALAMIN   Take 100 mcg by mouth every morning.     Vitamin D 2000 UNITS tablet  Take 2,000 Units by mouth daily.     zolpidem 10 MG tablet  Commonly known as:  AMBIEN  TAKE 1 TABLET BY MOUTH AT BEDTIME AS NEEDED     ZYFLO CR 600 MG CR tablet  Generic drug:  zileuton  Take 1,200 mg by mouth every morning.           Follow-up Information   Follow up with Adolph Pollack, MD.   Contact information:   [address] [place] [address] [phone]       Signed: Valarie Merino 05/01/2013, 1:19 PM

## 2013-05-01 NOTE — Progress Notes (Signed)
Per MD order, PICC line removed. Cath intact at 38cm. Vaseline pressure gauze to site, pressure held x 5min. No bleeding to site. Pt instructed to keep dressing CDI x 24 hours. Avoid heavy lifting, pushing or pulling x 24 hours,  If bleeding occurs hold pressure, if bleeding does not stop contact MD or go to the ED. Pt does not have any questions. Barret Esquivel M  

## 2013-05-01 NOTE — Progress Notes (Signed)
ANTIBIOTIC CONSULT NOTE - FOLLOW UP  Pharmacy Consult for vancomycin Indication: wound infection s/p colectomy 6/9   Allergies  Allergen Reactions  . Levofloxacin Anaphylaxis    Possibly Levaquin or Lisinopril, pt was taking both at the time of reaction  . Lisinopril Anaphylaxis    Possibly Lisinopril or Levaquin, pt was taking both at the time of reaction  . Bee Venom Swelling  . Morphine And Related Itching    Needs benadryl prior to     Patient Measurements: Height: 5' 5.5" (166.4 cm) Weight: 270 lb (122.471 kg) IBW/kg (Calculated) : 58.15 Adjusted Body Weight:   Vital Signs: Temp: 98.9 F (37.2 C) (06/17 2201) Temp src: Oral (06/17 2201) BP: 149/82 mmHg (06/17 2201) Pulse Rate: 64 (06/17 2201) Intake/Output from previous day: 06/17 0701 - 06/18 0700 In: 1405.8 [P.O.:240; I.V.:865.8; IV Piggyback:300] Out: 4700 [Urine:4500; Drains:200] Intake/Output from this shift: Total I/O In: 640 [P.O.:240; I.V.:400] Out: 4100 [Urine:4050; Drains:50]  Labs:  Recent Labs  04/29/13 1020  WBC 8.3  HGB 8.8*  PLT 291   Estimated Creatinine Clearance: 99 ml/min (by C-G formula based on Cr of 0.76).  Recent Labs  05/01/13 0359  VANCOTROUGH 17.0     Microbiology: Recent Results (from the past 720 hour(s))  SURGICAL PCR SCREEN     Status: None   Collection Time    04/15/13  2:56 PM      Result Value Range Status   MRSA, PCR NEGATIVE  NEGATIVE Final   Staphylococcus aureus NEGATIVE  NEGATIVE Final   Comment:            The Xpert SA Assay (FDA     approved for NASAL specimens     in patients over 69 years of age),     is one component of     a comprehensive surveillance     program.  Test performance has     been validated by [employer] for patients greater     than or equal to 35 year old.     It is not intended     to diagnose infection nor to     guide or monitor treatment.    Anti-infectives   Start     Dose/Rate Route Frequency Ordered Stop   04/28/13 0400  vancomycin (VANCOCIN) 1,250 mg in sodium chloride 0.9 % 250 mL IVPB     1,250 mg 166.7 mL/hr over 90 Minutes Intravenous Every 12 hours 04/27/13 1655     04/27/13 2000  cefOXitin (MEFOXIN) 2 g in dextrose 5 % 50 mL IVPB     2 g 100 mL/hr over 30 Minutes Intravenous 4 times per day 04/27/13 1656     04/27/13 0000  vancomycin (VANCOCIN) 1,250 mg in sodium chloride 0.9 % 250 mL IVPB  Status:  Discontinued     1,250 mg 166.7 mL/hr over 90 Minutes Intravenous Every 12 hours 04/26/13 0840 04/27/13 1655   04/26/13 1400  cefOXitin (MEFOXIN) 2 g in dextrose 5 % 50 mL IVPB  Status:  Discontinued     2 g 100 mL/hr over 30 Minutes Intravenous 4 times per day 04/26/13 1237 04/27/13 1656   04/26/13 0900  vancomycin (VANCOCIN) 2,000 mg in sodium chloride 0.9 % 500 mL IVPB     2,000 mg 250 mL/hr over 120 Minutes Intravenous  Once 04/26/13 0840 04/26/13 1119   04/25/13 1100  cefoTEtan (CEFOTAN) 1 g in dextrose 5 % 50 mL IVPB  Status:  Discontinued  1 g 100 mL/hr over 30 Minutes Intravenous Every 12 hours 04/25/13 0957 04/26/13 1236   04/22/13 2200  cefoTEtan (CEFOTAN) 2 g in dextrose 5 % 50 mL IVPB  Status:  Discontinued     2 g 100 mL/hr over 30 Minutes Intravenous Every 12 hours 04/22/13 1735 04/22/13 1748   04/22/13 1830  cefOXitin (MEFOXIN) 2 g in dextrose 5 % 50 mL IVPB     2 g 100 mL/hr over 30 Minutes Intravenous Every 6 hours 04/22/13 1750 04/23/13 0629   04/22/13 0831  cefOXitin (MEFOXIN) 2 g in dextrose 5 % 50 mL IVPB     2 g 100 mL/hr over 30 Minutes Intravenous On call to O.R. 04/22/13 0831 04/22/13 1113      Assessment: Patient with vancomycin trough at goal.  No issues noted per RN.  Goal of Therapy:  Vancomycin trough level 15-20 mcg/ml  Plan:  Measure antibiotic drug levels at steady state Follow up culture results Continue with current dosing  Aleene Davidson Crowford 05/01/2013,5:47 AM

## 2013-05-01 NOTE — Consult Note (Signed)
WOC consult Note Reason for Consult:discontinued staple to distal end of midline incision as requested by Dr. Daphine Deutscher.  Also removed staples at lap-stab sites. Steri-strips applied. Place absorbant dressing into defect for transfer home.  VAC to be placed tonight by [facility]. Wound type:Surgical Pressure Ulcer POA: Yes/No Dressing procedure/placement/frequency:Aquacel Ag+ (a silver hydrofiber absorbant dressing) placed into wound defect and covered with dry dressing, secured with tape. Patient comfortable and without questions at this time. I will remain available to her should she have any questions in the future. Thanks, Ladona Mow, MSN, RN, [facility], CWOCN [phone])

## 2013-05-02 ENCOUNTER — Encounter: Payer: 59 | Admitting: Internal Medicine

## 2013-05-03 ENCOUNTER — Telehealth (INDEPENDENT_AMBULATORY_CARE_PROVIDER_SITE_OTHER): Payer: Self-pay

## 2013-05-03 NOTE — Telephone Encounter (Signed)
Pt called stating when she got up to go to restroom she noted a small clot of blood at LLQ port site and about 10cc of watery bright red blood had drained. Her sister has placed clean pressure drsg on area. No swelling or bruising of area.  Pt advised to also apply ice pack over dsg and keep pressure for 20 min. Pt advised if bleeding resolves to watch area and call if bleeding starts again. Pt advised we have MD on call thru the week end if she has concerns.

## 2013-05-06 ENCOUNTER — Ambulatory Visit (INDEPENDENT_AMBULATORY_CARE_PROVIDER_SITE_OTHER): Payer: Commercial Managed Care - PPO | Admitting: Surgery

## 2013-05-06 ENCOUNTER — Telehealth (INDEPENDENT_AMBULATORY_CARE_PROVIDER_SITE_OTHER): Payer: Self-pay

## 2013-05-06 ENCOUNTER — Encounter (INDEPENDENT_AMBULATORY_CARE_PROVIDER_SITE_OTHER): Payer: Self-pay | Admitting: Surgery

## 2013-05-06 VITALS — BP 138/74 | HR 72 | Temp 97.7°F | Resp 16 | Ht 65.0 in | Wt 285.2 lb

## 2013-05-06 DIAGNOSIS — K5732 Diverticulitis of large intestine without perforation or abscess without bleeding: Secondary | ICD-10-CM

## 2013-05-06 DIAGNOSIS — T148XXD Other injury of unspecified body region, subsequent encounter: Secondary | ICD-10-CM | POA: Insufficient documentation

## 2013-05-06 DIAGNOSIS — B372 Candidiasis of skin and nail: Secondary | ICD-10-CM | POA: Insufficient documentation

## 2013-05-06 DIAGNOSIS — T07XXXA Unspecified multiple injuries, initial encounter: Secondary | ICD-10-CM

## 2013-05-06 NOTE — Patient Instructions (Signed)
Discontinue negative pressure dressings (vacuum dressing).  Wet to dry dressing changes with normal saline moistened Kerlix gauze twice daily.  Velora Heckler, MD, [facility] Office: [phone]

## 2013-05-06 NOTE — Addendum Note (Signed)
Addended by: Joanette Gula on: 05/06/2013 05:07 PM   Modules accepted: Orders

## 2013-05-06 NOTE — Telephone Encounter (Signed)
[place] at [facility] advised of wound care order changes. Per her request order faxed to [phone]. Pt advised if they do not hear from [facility] by AM tomorrow to call office and advise Dr Abbey Chatters  And or his assistant.  Pt states she understands.

## 2013-05-06 NOTE — Telephone Encounter (Signed)
Melissa with [facility] is stating patient wound has dehisced ? Infection OV Urgent appt today 4:15. Melissa aware

## 2013-05-06 NOTE — Progress Notes (Signed)
General Surgery [facility]  Visit Diagnoses: 1. Diverticulitis of proximal descending colon   2. Wound healing, delayed   3. Candidal skin infection     HISTORY: Patient is a 62 year old female who underwent laparoscopic-assisted left colectomy for diverticular disease 2 weeks ago. Over the past 24-hour snare have been problems with her vacuum dressing. She presents to the clinic for evaluation.  EXAM: The patient is examined with Dr. Abbey Chatters.  The abdominal wound is gently opened with exploration digitally. A moderate amount of serosanguineous fluid is evacuated. Palpation down to the level of the fascia is possible. Sutures appear to be intact but fascial edges are separated at least 1-2 cm. There is no evidence of dehiscence. Wound is packed with saline moistened Kerlix gauze wet to dry.  IMPRESSION: Status post laparoscopic assisted left colectomy Open abdominal wound with partial fascial dehiscence. Rule out intra-abdominal complications  PLAN: Dressing changes will be changed to normal saline moistened Kerlix gauze wet to dry twice daily. We will discontinue the negative pressure wound care at this time.  CT scan of abdomen and pelvis will be ordered to rule out any intra-abdominal complications.  Patient will be started on topical Mycostatin cream 3 times daily to affected areas for Candida infection of the skin.  Patient will return to see Dr. Abbey Chatters in the office in 3 days.  Velora Heckler, MD, FACS General & Endocrine Surgery [facility]

## 2013-05-07 ENCOUNTER — Telehealth (INDEPENDENT_AMBULATORY_CARE_PROVIDER_SITE_OTHER): Payer: Self-pay | Admitting: *Deleted

## 2013-05-07 ENCOUNTER — Telehealth (INDEPENDENT_AMBULATORY_CARE_PROVIDER_SITE_OTHER): Payer: Self-pay

## 2013-05-07 ENCOUNTER — Ambulatory Visit
Admission: RE | Admit: 2013-05-07 | Discharge: 2013-05-07 | Disposition: A | Discharge status: Home / Self Care | Payer: 59 | Source: Ambulatory Visit | Attending: Surgery | Admitting: Surgery

## 2013-05-07 DIAGNOSIS — B372 Candidiasis of skin and nail: Secondary | ICD-10-CM

## 2013-05-07 DIAGNOSIS — T148XXD Other injury of unspecified body region, subsequent encounter: Secondary | ICD-10-CM

## 2013-05-07 DIAGNOSIS — K5732 Diverticulitis of large intestine without perforation or abscess without bleeding: Secondary | ICD-10-CM

## 2013-05-07 MED ORDER — IOHEXOL 300 MG/ML  SOLN
125.0000 mL | Freq: Once | INTRAMUSCULAR | Status: AC | PRN
  Administered 2013-05-07: 125 mL via INTRAVENOUS

## 2013-05-07 NOTE — Telephone Encounter (Signed)
Patient states medication  Mycostatin cream has not been phoned in to Rx. Mycostatin cr tid to affected areas . Per Dr. Evelena Leyden to [facility] Patient aware

## 2013-05-07 NOTE — Telephone Encounter (Signed)
[facility] called to state that the CT results are available at this time and they wanted to make [place] aware so it can be reviewed by a doctor sooner rather than later.  Dr. Abbey Chatters is unavailable this afternoon so report given to Dr. Gerrit Friends who saw patient yesterday in urgent office and ordered CT scan.

## 2013-05-07 NOTE — Telephone Encounter (Signed)
Dr Gerrit Friends reviewed ct. Per Dr Gerrit Friends CT ok to hold and have Dr Abbey Chatters review CT tomorrow. Copy of CT to [facility] and will send msg to Dr Abbey Chatters to review.

## 2013-05-08 NOTE — Telephone Encounter (Signed)
msg forwarded to Dr Maris Berger CMA.

## 2013-05-08 NOTE — Telephone Encounter (Signed)
CT reviewed.  Nothing different to do at this time.

## 2013-05-09 ENCOUNTER — Ambulatory Visit (INDEPENDENT_AMBULATORY_CARE_PROVIDER_SITE_OTHER): Payer: Commercial Managed Care - PPO | Admitting: General Surgery

## 2013-05-09 ENCOUNTER — Encounter (INDEPENDENT_AMBULATORY_CARE_PROVIDER_SITE_OTHER): Payer: Self-pay | Admitting: General Surgery

## 2013-05-09 VITALS — BP 140/84 | HR 72 | Temp 98.2°F | Resp 16 | Ht 65.0 in | Wt 276.4 lb

## 2013-05-09 DIAGNOSIS — Z9889 Other specified postprocedural states: Secondary | ICD-10-CM

## 2013-05-09 NOTE — Progress Notes (Signed)
Procedure:  Laparoscopic assisted sigmoid colectomy  Date:  04/22/2013  Pathology:  Diverticular disease  History:  She is here for another postoperative visit. She has a wound infection that is being treated with wet-to-dry dressing changes. Her appetite is good. She is doing some exercising.  Exam: General- Is in NAD.  Abdomen-open wound demonstrates good granulation tissue. The depth of the wound was probed and is clean. Wound was repacked with moist gauze followed by dry dressing. Rash in the groin looks better.  CT scan demonstrates the open wound but no intra-abdominal abscess.  Assessment:  Wound infection post sigmoid colectomy. Wound healing in by secondary intention.  Plan:  Continue current treatment. Increase protein in diet. Vitamin A, vitamin C, and zinc supplements. Return visit in about a week and a half.

## 2013-05-09 NOTE — Patient Instructions (Signed)
Eat plenty of protein.  Take supplemental Vitamin A, Vitamin C, Zinc.

## 2013-05-10 ENCOUNTER — Telehealth (INDEPENDENT_AMBULATORY_CARE_PROVIDER_SITE_OTHER): Payer: Self-pay

## 2013-05-10 ENCOUNTER — Other Ambulatory Visit (INDEPENDENT_AMBULATORY_CARE_PROVIDER_SITE_OTHER): Payer: Self-pay | Admitting: General Surgery

## 2013-05-10 DIAGNOSIS — K5732 Diverticulitis of large intestine without perforation or abscess without bleeding: Secondary | ICD-10-CM

## 2013-05-10 NOTE — Telephone Encounter (Signed)
Error

## 2013-05-13 ENCOUNTER — Telehealth (INDEPENDENT_AMBULATORY_CARE_PROVIDER_SITE_OTHER): Payer: Self-pay

## 2013-05-13 NOTE — Telephone Encounter (Signed)
Pt is receiving dressing changes once daily - [facility] coming out 2 x wk, and her daughter-in-law is taking care of the remainder of the days.  Wound is draining a lot.  She said Melissa from [facility] may call.

## 2013-05-20 ENCOUNTER — Encounter (INDEPENDENT_AMBULATORY_CARE_PROVIDER_SITE_OTHER): Payer: Self-pay | Admitting: General Surgery

## 2013-05-20 ENCOUNTER — Ambulatory Visit (INDEPENDENT_AMBULATORY_CARE_PROVIDER_SITE_OTHER): Payer: Commercial Managed Care - PPO | Admitting: General Surgery

## 2013-05-20 VITALS — BP 128/82 | HR 84 | Temp 98.4°F | Resp 18 | Ht 65.0 in | Wt 267.2 lb

## 2013-05-20 DIAGNOSIS — Z9889 Other specified postprocedural states: Secondary | ICD-10-CM

## 2013-05-20 NOTE — Progress Notes (Signed)
Procedure:  Laparoscopic assisted sigmoid colectomy  Date:  04/22/2013  Pathology:  Diverticular disease  History:  She is here for a postoperative visit.  Her wound continues to drain some. She had some problems with constipation and has taken Dulcolax tablets for this. She had constipation preoperatively. Exam: General- Is in NAD.  Abdomen-open wound demonstrates good granulation tissue.  There is increased moisture in the wound that is nonpurulent.  The wound was repacked.   Assessment:  Wound infection post sigmoid colectomy. Wound healing in slowly by secondary intention.  Plan:  Increase dressing changes to twice a day.  RTC in 2 weeks.

## 2013-05-20 NOTE — Patient Instructions (Signed)
Try to change the dressing twice a day if possible.

## 2013-05-21 ENCOUNTER — Ambulatory Visit (INDEPENDENT_AMBULATORY_CARE_PROVIDER_SITE_OTHER): Payer: 59 | Admitting: Internal Medicine

## 2013-05-21 ENCOUNTER — Encounter: Payer: Self-pay | Admitting: Internal Medicine

## 2013-05-21 VITALS — BP 136/76 | Temp 99.0°F | Wt 267.0 lb

## 2013-05-21 DIAGNOSIS — T889XXS Complication of surgical and medical care, unspecified, sequela: Secondary | ICD-10-CM

## 2013-05-21 DIAGNOSIS — IMO0001 Reserved for inherently not codable concepts without codable children: Secondary | ICD-10-CM

## 2013-05-21 DIAGNOSIS — J069 Acute upper respiratory infection, unspecified: Secondary | ICD-10-CM

## 2013-05-21 MED ORDER — LEVOFLOXACIN 750 MG PO TABS: 750.0000 mg | ORAL_TABLET | Freq: Every day | ORAL | Status: DC

## 2013-05-21 NOTE — Patient Instructions (Addendum)
Take Levaquin 750 mg daily x 7 days.

## 2013-06-03 ENCOUNTER — Encounter (INDEPENDENT_AMBULATORY_CARE_PROVIDER_SITE_OTHER): Payer: Self-pay | Admitting: General Surgery

## 2013-06-03 ENCOUNTER — Ambulatory Visit (INDEPENDENT_AMBULATORY_CARE_PROVIDER_SITE_OTHER): Payer: Commercial Managed Care - PPO | Admitting: General Surgery

## 2013-06-03 VITALS — BP 124/78 | HR 82 | Resp 16 | Ht 65.0 in | Wt 263.8 lb

## 2013-06-03 DIAGNOSIS — Z9889 Other specified postprocedural states: Secondary | ICD-10-CM

## 2013-06-03 NOTE — Patient Instructions (Signed)
Continue current dressing change regimen.

## 2013-06-03 NOTE — Progress Notes (Signed)
Procedure:  Laparoscopic assisted sigmoid colectomy  Date:  04/22/2013  Pathology:  Diverticular disease  History:  She is here for another postoperative visit.  Her wound continues to drain some. She has been told that the wound looks better. She is intermittently taking MiraLAX to help with her chronic constipation. Her energy level is improving.  Exam: General- Is in NAD.  Abdomen-open wound demonstrates good granulation tissue and is less deep.  The wound was repacked.   Assessment:  Wound infection post sigmoid colectomy. Wound healing in slowly by secondary intention and is now only 3 cm deep.  Plan:  Continue dressing changes to twice a day.  May return to the light-duty work.  RTC in 2 weeks.

## 2013-06-07 ENCOUNTER — Telehealth (INDEPENDENT_AMBULATORY_CARE_PROVIDER_SITE_OTHER): Payer: Self-pay

## 2013-06-07 NOTE — Telephone Encounter (Signed)
That cannot be determined at this time.  She may perform all activities but has the 20 lb restriction until I see her back.

## 2013-06-07 NOTE — Telephone Encounter (Signed)
Pt's employer would like more clarification of exactly when she will be able to resume "full" duties.  20 lb restrictions are not enough information.  They are looking for a specific date.  Request passed on to Dr. Abbey Chatters.

## 2013-06-11 ENCOUNTER — Telehealth (INDEPENDENT_AMBULATORY_CARE_PROVIDER_SITE_OTHER): Payer: Self-pay

## 2013-06-11 NOTE — Telephone Encounter (Signed)
Pt calling to only speak to [place]. I tried to help the pt but she wants Cyndra Numbers to give her a call today about returning to work.

## 2013-06-18 ENCOUNTER — Encounter (INDEPENDENT_AMBULATORY_CARE_PROVIDER_SITE_OTHER): Payer: Self-pay

## 2013-06-18 ENCOUNTER — Ambulatory Visit (INDEPENDENT_AMBULATORY_CARE_PROVIDER_SITE_OTHER): Payer: Commercial Managed Care - PPO | Admitting: General Surgery

## 2013-06-18 ENCOUNTER — Encounter (INDEPENDENT_AMBULATORY_CARE_PROVIDER_SITE_OTHER): Payer: Self-pay | Admitting: General Surgery

## 2013-06-18 VITALS — BP 140/82 | HR 68 | Temp 98.1°F | Resp 15 | Ht 65.0 in | Wt 264.6 lb

## 2013-06-18 DIAGNOSIS — Z9889 Other specified postprocedural states: Secondary | ICD-10-CM

## 2013-06-18 NOTE — Patient Instructions (Signed)
May return to full activities on Monday. Clean wound twice daily with warm water and apply a dry bandage.

## 2013-06-18 NOTE — Progress Notes (Signed)
Procedure:  Laparoscopic assisted sigmoid colectomy  Date:  04/22/2013  Pathology:  Diverticular disease  History:  She is here for another postoperative visit.  Her wound is very shallow. She is back doing some light-duty work.  Exam: General- Is in NAD.  Abdomen-Open wound is very small and is only about 3-4 mm deep. Assessment:  Wound infection post sigmoid colectomy. Wound healing in slowly by secondary intention and is now only 3-4 mm deep.  Plan: Clean wound twice a day with warm water and apply dry dressing. Return to full activities in 6 days. Return visit one month.

## 2013-06-19 ENCOUNTER — Other Ambulatory Visit: Payer: Self-pay | Admitting: Internal Medicine

## 2013-06-24 ENCOUNTER — Other Ambulatory Visit: Payer: Self-pay

## 2013-06-24 ENCOUNTER — Other Ambulatory Visit: Payer: Self-pay | Admitting: Internal Medicine

## 2013-06-24 MED ORDER — ALPRAZOLAM 0.5 MG PO TABS: 0.5000 mg | ORAL_TABLET | Freq: Two times a day (BID) | ORAL | Status: DC | PRN

## 2013-06-24 NOTE — Telephone Encounter (Signed)
Not familiar with [employer]. Please look into this.

## 2013-07-16 ENCOUNTER — Other Ambulatory Visit: Payer: Self-pay | Admitting: Internal Medicine

## 2013-07-23 ENCOUNTER — Encounter (INDEPENDENT_AMBULATORY_CARE_PROVIDER_SITE_OTHER): Payer: Self-pay | Admitting: General Surgery

## 2013-07-23 ENCOUNTER — Ambulatory Visit (INDEPENDENT_AMBULATORY_CARE_PROVIDER_SITE_OTHER): Payer: Commercial Managed Care - PPO | Admitting: General Surgery

## 2013-07-23 VITALS — BP 138/74 | HR 68 | Resp 18 | Ht 65.5 in | Wt 271.6 lb

## 2013-07-23 DIAGNOSIS — Z9889 Other specified postprocedural states: Secondary | ICD-10-CM

## 2013-07-23 NOTE — Progress Notes (Signed)
Procedure:  Laparoscopic assisted sigmoid colectomy  Date:  04/22/2013  Pathology:  Diverticular disease  History:  She is here for another postoperative visit.  Her wound closed up about a week after her last visit. She started putting some vitamin E oil on it and this morning there is a little superficial separation. She feels better than she has had a long time. She has a good energy level. She is able to have bowel movements without using MiraLAX daily. Exam: General- Is in NAD.  Abdomen:  There is a superficial abrasion measuring about 1.5 cm in the mid aspect of the midline scar. No purulent drainage.  Assessment:  Wound infection post sigmoid colectomy. Wound has essentially healed. Has an abrasion to the groin. I covered this with a Band-Aid.  Plan: keep wound clean. Stopped using the oil on the wound. Keep covered with a bandage until it heals. High fiber diet. Return visit as needed.

## 2013-07-23 NOTE — Patient Instructions (Signed)
High fiber diet as we discussed. Increase  intake of raw fruits, raw vegetables, and multi-grains. Decrease intake of carbohydrates and sugars.

## 2013-08-29 ENCOUNTER — Other Ambulatory Visit: Payer: Self-pay | Admitting: *Deleted

## 2013-08-29 MED ORDER — LEVOTHYROXINE SODIUM 200 MCG PO TABS: 200.0000 ug | ORAL_TABLET | Freq: Every day | ORAL | Status: DC

## 2013-09-16 ENCOUNTER — Encounter: Payer: Self-pay | Admitting: Internal Medicine

## 2013-09-19 ENCOUNTER — Other Ambulatory Visit: Payer: Self-pay

## 2013-09-30 ENCOUNTER — Other Ambulatory Visit: Payer: 59 | Admitting: Internal Medicine

## 2013-09-30 DIAGNOSIS — E039 Hypothyroidism, unspecified: Secondary | ICD-10-CM

## 2013-09-30 LAB — T4, FREE: Free T4: 1.36 ng/dL (ref 0.80–1.80)

## 2013-10-01 ENCOUNTER — Encounter: Payer: Self-pay | Admitting: Internal Medicine

## 2013-10-01 ENCOUNTER — Ambulatory Visit (INDEPENDENT_AMBULATORY_CARE_PROVIDER_SITE_OTHER): Payer: 59 | Admitting: Internal Medicine

## 2013-10-01 ENCOUNTER — Other Ambulatory Visit: Payer: Self-pay | Admitting: *Deleted

## 2013-10-01 VITALS — BP 138/80 | HR 64 | Temp 97.3°F | Ht 64.0 in | Wt 282.0 lb

## 2013-10-01 DIAGNOSIS — M12519 Traumatic arthropathy, unspecified shoulder: Secondary | ICD-10-CM

## 2013-10-01 DIAGNOSIS — L659 Nonscarring hair loss, unspecified: Secondary | ICD-10-CM

## 2013-10-01 DIAGNOSIS — M12811 Other specific arthropathies, not elsewhere classified, right shoulder: Secondary | ICD-10-CM

## 2013-10-01 DIAGNOSIS — M75101 Unspecified rotator cuff tear or rupture of right shoulder, not specified as traumatic: Secondary | ICD-10-CM

## 2013-10-01 MED ORDER — HYDROCODONE-ACETAMINOPHEN 10-325 MG PO TABS: 1.0000 | ORAL_TABLET | Freq: Four times a day (QID) | ORAL | Status: DC | PRN

## 2013-10-01 MED ORDER — EPINEPHRINE 0.3 MG/0.3ML IJ SOAJ: 0.3000 mg | Freq: Once | INTRAMUSCULAR | Status: DC

## 2013-10-02 ENCOUNTER — Other Ambulatory Visit: Payer: Self-pay | Admitting: Internal Medicine

## 2013-10-12 ENCOUNTER — Encounter: Payer: Self-pay | Admitting: Internal Medicine

## 2013-10-12 NOTE — Progress Notes (Signed)
   Subjective:    Patient ID: [name], female    DOB: [birth year], 62 y.o.   MRN: [id number]  HPI Patient had partial colectomy done laparoscopically for recurrent diverticulitis in June. She apparently developed a wound infection and has  to let an incision heal by secondary intention. Incision is packed. Needs to have packing changed today. Also here for acute respiratory infection. Has discolored sputum production. No fever or shaking chills. Has malaise and fatigue status post surgery. A bit depressed about slow recovery.    Review of Systems     Objective:   Physical Exam  Skin is warm and dry. Nodes none. HEENT exam: TMs are slightly full but not red. Pharynx slightly injected. Neck is supple. Chest clear to auscultation without rales or wheezing. Abdominal incision: Lengthy transverse abdominal incision requires packing. Wound looks clean.        Assessment & Plan:  Acute URI  Wound infection status post laparoscopic surgery for recurrent diverticulitis. Wound was repacked at her request.  Plan: Levaquin 500 mg daily for 7 days for acute URI. Continue followup with surgeon for wound infection.  25 minutes spent with patient.

## 2013-10-16 ENCOUNTER — Other Ambulatory Visit: Payer: Self-pay | Admitting: Orthopedic Surgery

## 2013-10-16 DIAGNOSIS — M25511 Pain in right shoulder: Secondary | ICD-10-CM

## 2013-10-18 ENCOUNTER — Other Ambulatory Visit: Payer: Self-pay | Admitting: Internal Medicine

## 2013-10-18 NOTE — Telephone Encounter (Signed)
Have refilled by phone for 6 months

## 2013-10-19 ENCOUNTER — Ambulatory Visit
Admission: RE | Admit: 2013-10-19 | Discharge: 2013-10-19 | Disposition: A | Discharge status: Home / Self Care | Payer: 59 | Source: Ambulatory Visit | Attending: Orthopedic Surgery | Admitting: Orthopedic Surgery

## 2013-10-19 DIAGNOSIS — M25511 Pain in right shoulder: Secondary | ICD-10-CM

## 2014-01-11 NOTE — Progress Notes (Signed)
   Subjective:    Patient ID: [name], female    DOB: 1951/10/28, 63 y.o.   MRN: [id number]  HPI In today for 2 reasons: Right shoulder pain and hair loss. She underwent extensive surgery for diverticulitis this past summer. She has a history of hypothyroidism. She is on thyroid replacement therapy. Also having considerable right shoulder pain not responding to anti-inflammatory medication. She works as a Public relations account executive. She is right-handed.    Review of Systems     Objective:   Physical Exam Thinning hair. No thyromegaly. Decreased range of motion right upper extremity       Assessment & Plan:   Hypothyroidism   Hair loss-could be due to stress from surgery but does have history of hypothyroidism. Check  TSH  Hypothyroidism  Right shoulder arthropathy-refer to Dr. Daylene Katayama  Addendum: Dr. Daylene Katayama has seen patient and suspects internal derangement. MRI will be ordered. TSH is within normal limits

## 2014-01-11 NOTE — Patient Instructions (Signed)
TSH checked. Refer to Dr. Daylene Katayama regarding shoulder pain

## 2014-01-21 ENCOUNTER — Ambulatory Visit (INDEPENDENT_AMBULATORY_CARE_PROVIDER_SITE_OTHER): Payer: 59 | Admitting: Internal Medicine

## 2014-01-21 ENCOUNTER — Encounter: Payer: Self-pay | Admitting: Internal Medicine

## 2014-01-21 VITALS — BP 146/92 | HR 80 | Temp 98.3°F | Wt 300.0 lb

## 2014-01-21 DIAGNOSIS — J029 Acute pharyngitis, unspecified: Secondary | ICD-10-CM

## 2014-01-21 LAB — POCT RAPID STREP A (OFFICE): Rapid Strep A Screen: NEGATIVE

## 2014-01-21 MED ORDER — LEVOFLOXACIN 500 MG PO TABS: 500.0000 mg | ORAL_TABLET | Freq: Every day | ORAL | Status: DC

## 2014-01-21 MED ORDER — HYDROCODONE-HOMATROPINE 5-1.5 MG/5ML PO SYRP: 5.0000 mL | ORAL_SOLUTION | Freq: Three times a day (TID) | ORAL | Status: DC | PRN

## 2014-01-21 NOTE — Patient Instructions (Signed)
Take Levaquin as directed. Take Hycodan as directed. Rest and drink plenty of fluids. Call if not better in 10 days

## 2014-01-21 NOTE — Progress Notes (Signed)
   Subjective:    Patient ID: [name], female    DOB: May 17, 1951, 63 y.o.   MRN: [id number]  HPI URI symptoms with ear pain and sore throat onset Saturday March 7th. Patient is worried about strep throat. A number of coworkers have been ill. No fever or shaking chills. No myalgias. Had influenza immunization through employment in the Fall of 2014. Complaining mostly of right ear pain. Patient has rhinorrhea.    Review of Systems     Objective:   Physical Exam Skin is warm and dry. Nodes none. Pharynx is injected without exudate. Rapid strep screen is negative. Both TMs are full and pink bilaterally but the right TM is slightly fuller. Neck is supple. Chest is clear without rales or wheezing        Assessment & Plan:  Acute URI  Bilateral serous otitis media  Plan: Levaquin 500 milligrams daily for 7 days. Hycodan 8 ounces 1 teaspoon by mouth every 6 hours when necessary cough.

## 2014-02-05 ENCOUNTER — Ambulatory Visit (HOSPITAL_COMMUNITY): Admission: RE | Admit: 2014-02-05 | Payer: 59 | Source: Ambulatory Visit

## 2014-02-12 ENCOUNTER — Other Ambulatory Visit: Payer: Self-pay | Admitting: Internal Medicine

## 2014-03-06 ENCOUNTER — Ambulatory Visit (HOSPITAL_COMMUNITY): Admission: RE | Admit: 2014-03-06 | Payer: 59 | Source: Ambulatory Visit

## 2014-03-20 IMAGING — CT CT ABD-PELV W/ CM
2 of 5 series · 16 of 46 positions shown, 18 images · IV contrast (READICAT/WATER & [ID] OMNI 300)
Comparison: CT abdomen pelvis of 02/14/2013

CLINICAL DATA: Partial abdominal wound dehiscence post colon
surgery.  Wound vac removed yesterday

CT ABDOMEN AND PELVIS WITH CONTRAST
TECHNIQUE: Multidetector CT imaging of the abdomen and pelvis was
performed following the standard protocol during bolus
administration of intravenous contrast.
Contrast: 125mL OMNIPAQUE IOHEXOL 300 MG/ML  SOLN

[Series 2: abd/pelvis w/ · axial · 0.98mm/px · z∈[-440,+10]mm · 13 of 102 slices shown, 15 images]
[im 6/102  soft-tissue]
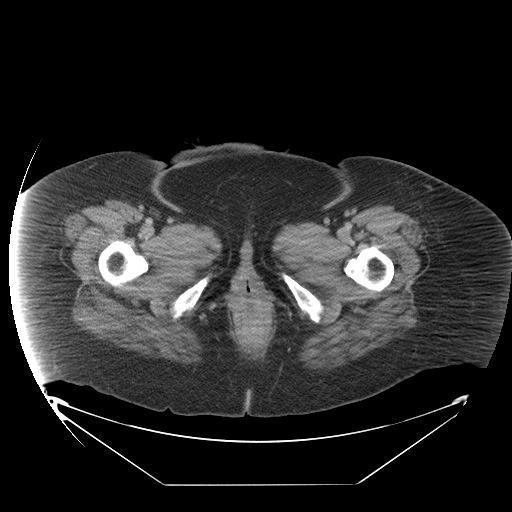
[im 6/102  bone]
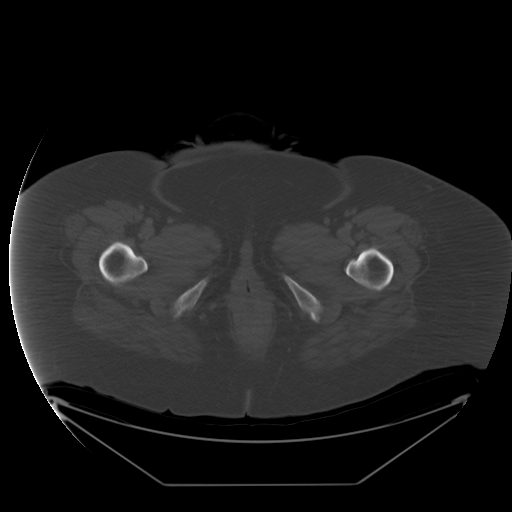
[im 16/102  soft-tissue]
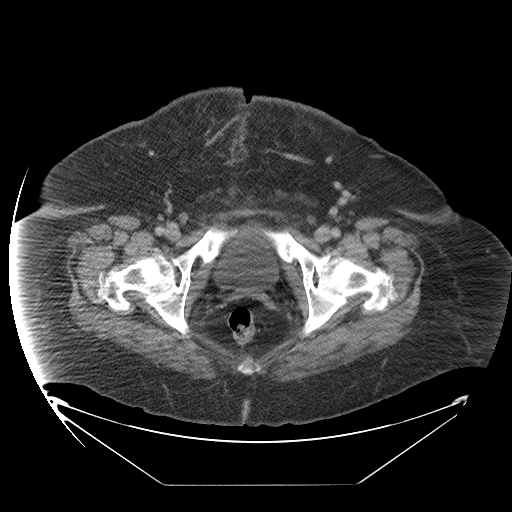
[im 22/102  soft-tissue]
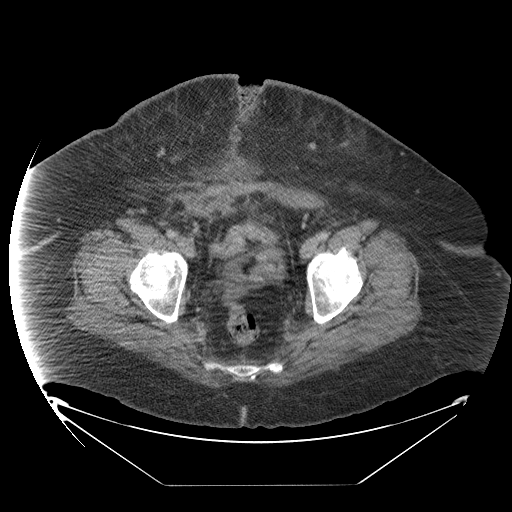
[im 27/102  soft-tissue]
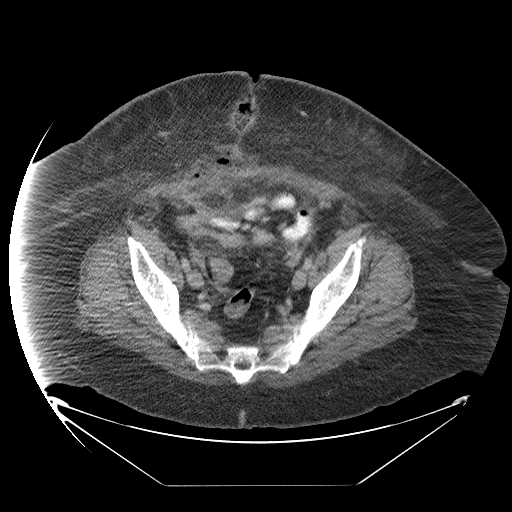
[im 38/102  soft-tissue]
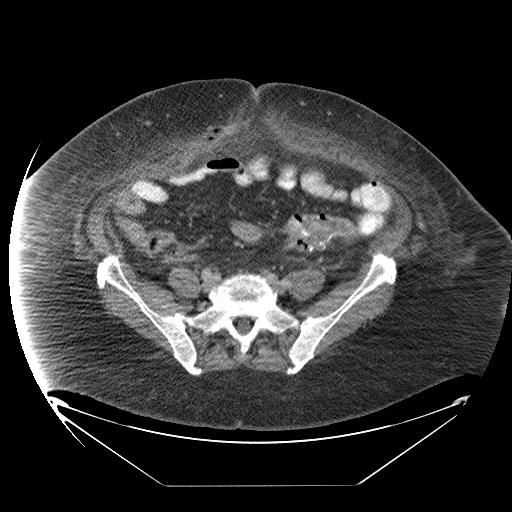
[im 43/102  soft-tissue]
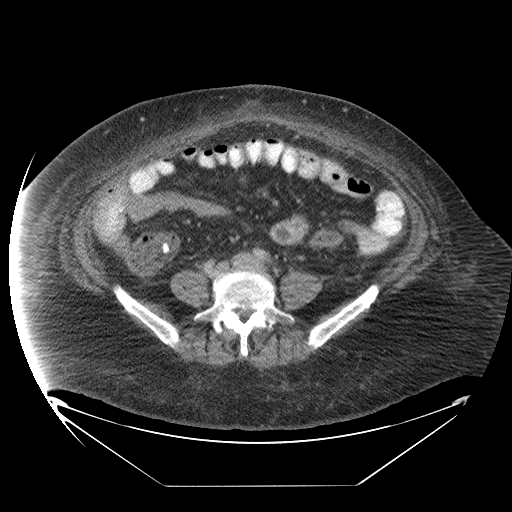
[im 54/102  soft-tissue]
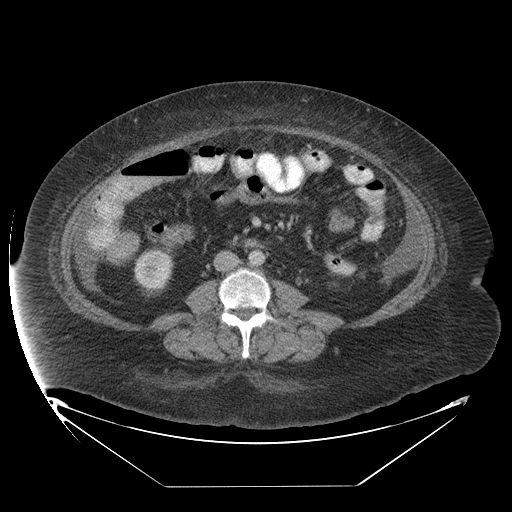
[im 59/102  soft-tissue]
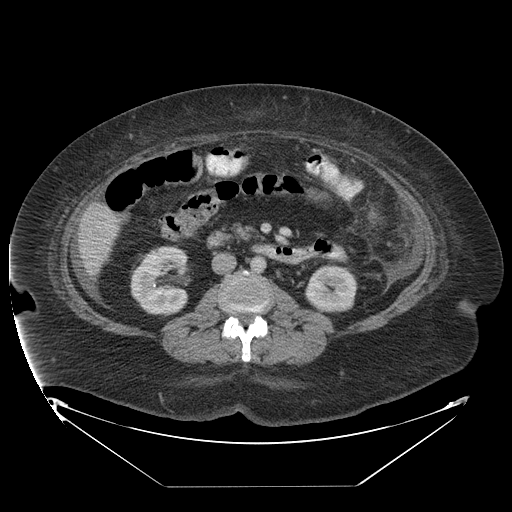
[im 64/102  soft-tissue]
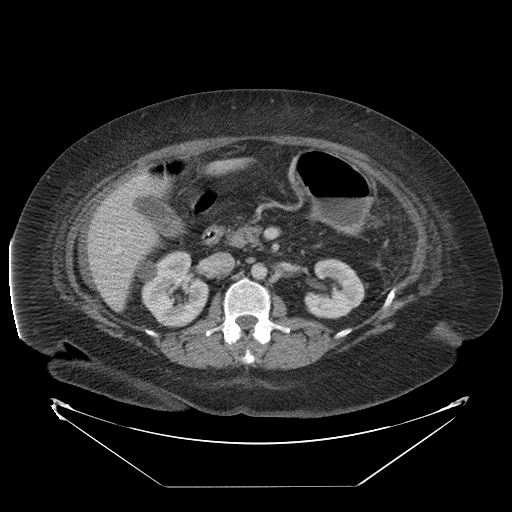
[im 64/102  bone]
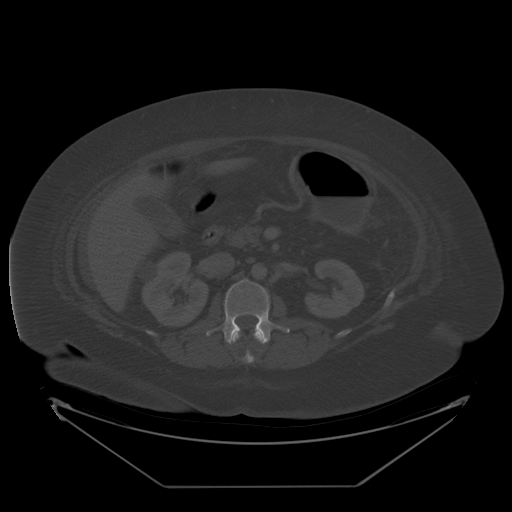
[im 75/102  soft-tissue]
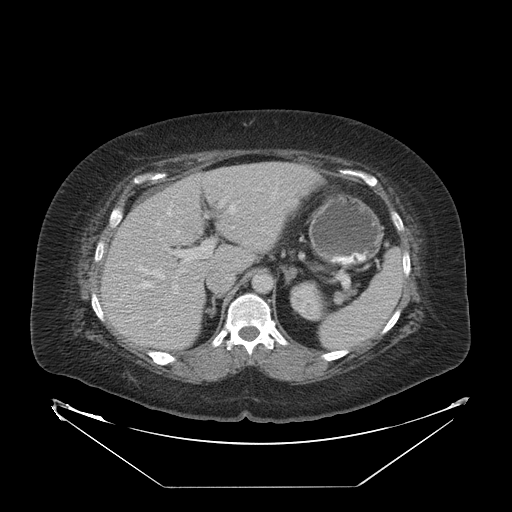
[im 80/102  soft-tissue]
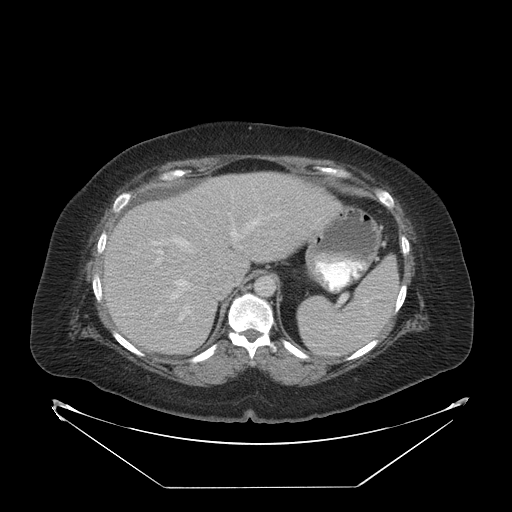
[im 86/102  soft-tissue]
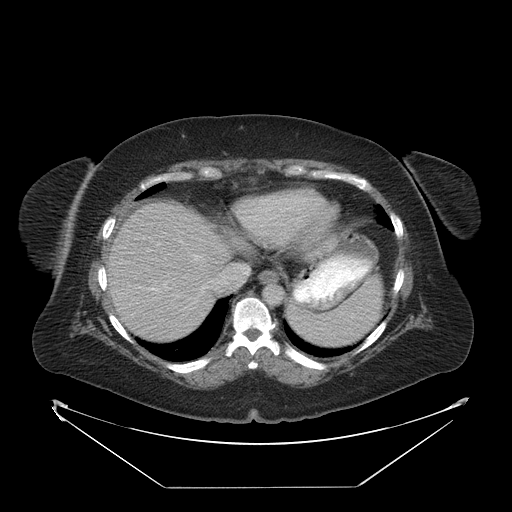
[im 96/102  soft-tissue]
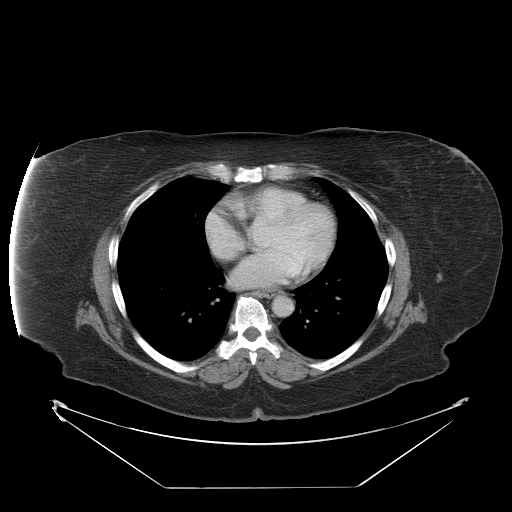

[Series 400: cor · coronal · 1.01mm/px · 3 of 158 slices shown]
[im 53/158  soft-tissue]
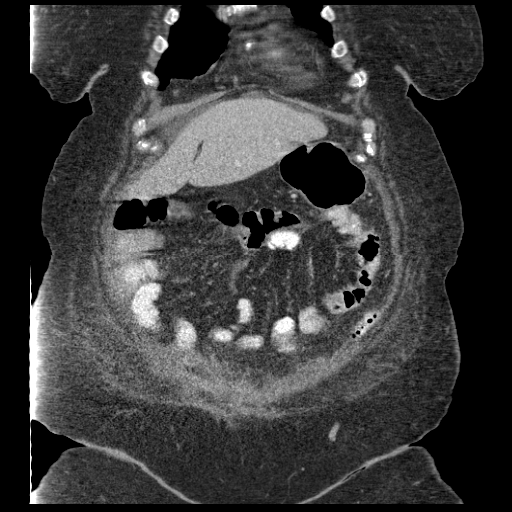
[im 70/158  soft-tissue]
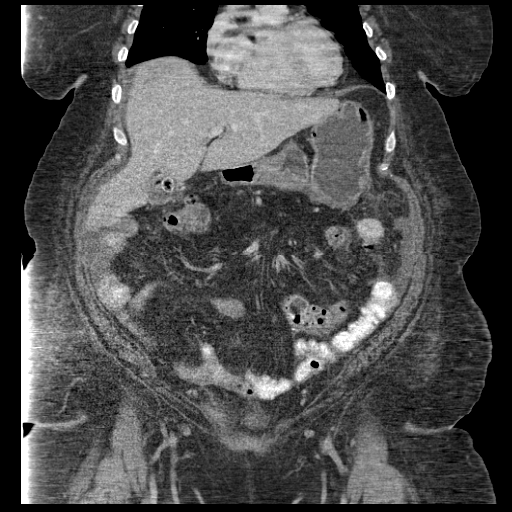
[im 88/158  soft-tissue]
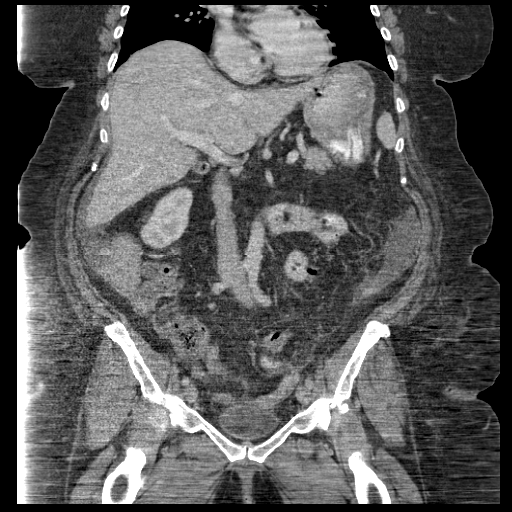

[16 of 46 positions shown; findings below may reference images not displayed]

FINDINGS: A calcified granuloma is again noted in the right lower
lobe and there is stable scarring peripherally in the right lower
lobe.  The liver enhances with no focal abnormality and no ductal
dilatation is seen.  Multiple gallstones are present within the
gallbladder, several of which contain air.  There is a small amount
of perihepatic fluid present.  In addition, fluid is noted in the
pericolic gutters bilaterally.  The pancreas is normal in size and
the pancreatic duct is not dilated.  The adrenal glands and spleen
are unremarkable.  The stomach is not well distended but no
abnormality is seen.  The kidneys enhance and there is a
nonobstructing small calculus in the lower pole of the right
kidney.  An exophytic cyst in the lower pole of the right kidney
anterolaterally is stable.  On delayed images, the pelvocaliceal
systems are unremarkable.  The proximal ureters are normal in
caliber.  The abdominal aorta is normal in caliber.  No adenopathy
is seen.

The anastomotic site in the left lower quadrant after resection of
a portion of the rectosigmoid colon appears patent with no adjacent
abscess or fluid evident.  The more proximal colon is unremarkable.

The slight dehiscence of the midline wound is noted with air
coursing through that region presumably due to recent removal of
the wound vac. There is a collection of air to the right of midline
within the subcutaneous soft tissues abutting the anterior
abdominal muscular wall again possibly due to recent removal of the
wound vac, but a developing abscess cannot be excluded.  There is
strandiness of the adjacent subcutaneous soft tissues, possibly due
to mild cellulitis.

The urinary bladder is not distended.  The uterus has been resected
previously.  No adnexal lesion is seen.  Degenerative disc disease
is present at L5-S1.
IMPRESSION: 1.  There is a air tracking through the slightly dehisced midline
wound in the lower abdomen - upper pelvis possibly due to recent
removal of the wound vac.  There is an adjacent collection of air
but no discrete abscess is seen.  Probable surrounding cellulitis.

2.  Free fluid in both pericolic gutters and around the liver.  No
discrete intraperitoneal abscess.
3.  The anastomotic site after sigmoid colon resection appears
patent with no adjacent abscess.
4.  Multiple gallstones.
5.  Small nonobstructing lower pole right renal calculus.

## 2014-04-10 ENCOUNTER — Other Ambulatory Visit: Payer: Self-pay

## 2014-04-10 MED ORDER — ESCITALOPRAM OXALATE 10 MG PO TABS: 10.0000 mg | ORAL_TABLET | Freq: Every day | ORAL | Status: DC

## 2014-05-15 ENCOUNTER — Other Ambulatory Visit: Payer: Self-pay

## 2014-05-15 ENCOUNTER — Other Ambulatory Visit: Payer: Self-pay | Admitting: Internal Medicine

## 2014-05-15 NOTE — Telephone Encounter (Signed)
Not sure what she is requesting refill on

## 2014-07-22 ENCOUNTER — Other Ambulatory Visit: Payer: Self-pay | Admitting: Internal Medicine

## 2014-08-11 ENCOUNTER — Other Ambulatory Visit: Payer: Self-pay | Admitting: Internal Medicine

## 2014-10-22 ENCOUNTER — Other Ambulatory Visit: Payer: Self-pay

## 2014-10-22 MED ORDER — LEVOTHYROXINE SODIUM 200 MCG PO TABS
200.0000 ug | ORAL_TABLET | Freq: Every day | ORAL | Status: DC
Start: 2014-10-22 — End: 2015-04-28

## 2014-10-22 MED ORDER — METOPROLOL TARTRATE 25 MG PO TABS: 25.0000 mg | ORAL_TABLET | Freq: Every morning | ORAL | Status: DC

## 2014-10-31 ENCOUNTER — Other Ambulatory Visit: Payer: Self-pay | Admitting: Internal Medicine

## 2014-10-31 MED ORDER — ALBUTEROL SULFATE HFA 108 (90 BASE) MCG/ACT IN AERS: 2.0000 | INHALATION_SPRAY | Freq: Four times a day (QID) | RESPIRATORY_TRACT | Status: DC | PRN

## 2014-10-31 NOTE — Telephone Encounter (Signed)
Xanax and ambien called into [facility].

## 2014-12-01 ENCOUNTER — Other Ambulatory Visit: Payer: Self-pay | Admitting: *Deleted

## 2014-12-01 MED ORDER — FUROSEMIDE 40 MG PO TABS: 40.0000 mg | ORAL_TABLET | Freq: Every day | ORAL | Status: DC

## 2014-12-01 NOTE — Telephone Encounter (Signed)
Refill on furosemide sent to pharmacy 90 day supply with 3 refills

## 2014-12-02 ENCOUNTER — Encounter (HOSPITAL_BASED_OUTPATIENT_CLINIC_OR_DEPARTMENT_OTHER): Payer: Self-pay | Admitting: *Deleted

## 2014-12-02 NOTE — Progress Notes (Signed)
To come in for [id number] [id number] 52 Denies sleep apnea-works at [employer] Will bring all meds and overnight bag

## 2014-12-02 NOTE — Progress Notes (Signed)
   12/02/14 1224  OBSTRUCTIVE SLEEP APNEA  Have you ever been diagnosed with sleep apnea through a sleep study? No  Do you snore loudly (loud enough to be heard through closed doors)?  0  Do you often feel tired, fatigued, or sleepy during the daytime? 0  Has anyone observed you stop breathing during your sleep? 0  Do you have, or are you being treated for high blood pressure? 1  BMI more than 35 kg/m2? 1  Age over 63 years old? 1  Neck circumference greater than 40 cm/16 inches? 1  Gender: 1  Obstructive Sleep Apnea Score 5  Score 4 or greater  Results sent to PCP

## 2014-12-03 ENCOUNTER — Encounter (HOSPITAL_BASED_OUTPATIENT_CLINIC_OR_DEPARTMENT_OTHER)
Admission: RE | Admit: 2014-12-03 | Discharge: 2014-12-03 | Disposition: A | Discharge status: Home / Self Care | Payer: 59 | Source: Ambulatory Visit | Attending: Orthopedic Surgery | Admitting: Orthopedic Surgery

## 2014-12-03 DIAGNOSIS — E119 Type 2 diabetes mellitus without complications: Secondary | ICD-10-CM | POA: Diagnosis not present

## 2014-12-03 DIAGNOSIS — F329 Major depressive disorder, single episode, unspecified: Secondary | ICD-10-CM | POA: Diagnosis not present

## 2014-12-03 DIAGNOSIS — M7541 Impingement syndrome of right shoulder: Secondary | ICD-10-CM | POA: Diagnosis not present

## 2014-12-03 DIAGNOSIS — K219 Gastro-esophageal reflux disease without esophagitis: Secondary | ICD-10-CM | POA: Diagnosis not present

## 2014-12-03 DIAGNOSIS — K5790 Diverticulosis of intestine, part unspecified, without perforation or abscess without bleeding: Secondary | ICD-10-CM | POA: Diagnosis not present

## 2014-12-03 DIAGNOSIS — Z87891 Personal history of nicotine dependence: Secondary | ICD-10-CM | POA: Diagnosis not present

## 2014-12-03 DIAGNOSIS — M75121 Complete rotator cuff tear or rupture of right shoulder, not specified as traumatic: Secondary | ICD-10-CM | POA: Diagnosis not present

## 2014-12-03 DIAGNOSIS — Z7982 Long term (current) use of aspirin: Secondary | ICD-10-CM | POA: Diagnosis not present

## 2014-12-03 DIAGNOSIS — F419 Anxiety disorder, unspecified: Secondary | ICD-10-CM | POA: Diagnosis not present

## 2014-12-03 DIAGNOSIS — Z888 Allergy status to other drugs, medicaments and biological substances status: Secondary | ICD-10-CM | POA: Diagnosis not present

## 2014-12-03 DIAGNOSIS — Z91030 Bee allergy status: Secondary | ICD-10-CM | POA: Diagnosis not present

## 2014-12-03 DIAGNOSIS — E039 Hypothyroidism, unspecified: Secondary | ICD-10-CM | POA: Diagnosis not present

## 2014-12-03 DIAGNOSIS — I440 Atrioventricular block, first degree: Secondary | ICD-10-CM | POA: Diagnosis not present

## 2014-12-03 DIAGNOSIS — Z885 Allergy status to narcotic agent status: Secondary | ICD-10-CM | POA: Diagnosis not present

## 2014-12-03 DIAGNOSIS — J45909 Unspecified asthma, uncomplicated: Secondary | ICD-10-CM | POA: Diagnosis not present

## 2014-12-03 DIAGNOSIS — I10 Essential (primary) hypertension: Secondary | ICD-10-CM | POA: Diagnosis not present

## 2014-12-03 LAB — BASIC METABOLIC PANEL
ANION GAP: 11 (ref 5–15)
BUN: 12 mg/dL (ref 6–23)
CHLORIDE: 103 meq/L (ref 96–112)
CO2: 27 mmol/L (ref 19–32)
CREATININE: 0.91 mg/dL (ref 0.50–1.10)
Calcium: 9.1 mg/dL (ref 8.4–10.5)
GFR calc Af Amer: 77 mL/min — ABNORMAL LOW (ref >90)
GFR calc non Af Amer: 66 mL/min — ABNORMAL LOW (ref >90)
Glucose, Bld: 117 mg/dL — ABNORMAL HIGH (ref 70–99)
Potassium: 4.6 mmol/L (ref 3.5–5.1)
Sodium: 141 mmol/L (ref 135–145)

## 2014-12-03 NOTE — Progress Notes (Signed)
Dr. Al Corpus did anesthesia consult on pt - Smith for surgery. Dr. Al Corpus reviewed EKG as well - ok for surgery.

## 2014-12-05 ENCOUNTER — Ambulatory Visit (HOSPITAL_BASED_OUTPATIENT_CLINIC_OR_DEPARTMENT_OTHER): Payer: 59 | Admitting: Anesthesiology

## 2014-12-05 ENCOUNTER — Encounter (HOSPITAL_BASED_OUTPATIENT_CLINIC_OR_DEPARTMENT_OTHER): Payer: Self-pay | Admitting: *Deleted

## 2014-12-05 ENCOUNTER — Ambulatory Visit (HOSPITAL_BASED_OUTPATIENT_CLINIC_OR_DEPARTMENT_OTHER)
Admission: RE | Admit: 2014-12-05 | Discharge: 2014-12-05 | Disposition: A | Discharge status: Home / Self Care | Payer: 59 | Source: Ambulatory Visit | Attending: Orthopedic Surgery | Admitting: Orthopedic Surgery

## 2014-12-05 ENCOUNTER — Encounter (HOSPITAL_BASED_OUTPATIENT_CLINIC_OR_DEPARTMENT_OTHER)
Admission: RE | Disposition: A | Discharge status: Home / Self Care | Payer: Self-pay | Source: Ambulatory Visit | Attending: Orthopedic Surgery

## 2014-12-05 DIAGNOSIS — I10 Essential (primary) hypertension: Secondary | ICD-10-CM | POA: Insufficient documentation

## 2014-12-05 DIAGNOSIS — K5790 Diverticulosis of intestine, part unspecified, without perforation or abscess without bleeding: Secondary | ICD-10-CM | POA: Insufficient documentation

## 2014-12-05 DIAGNOSIS — F329 Major depressive disorder, single episode, unspecified: Secondary | ICD-10-CM | POA: Insufficient documentation

## 2014-12-05 DIAGNOSIS — Z87891 Personal history of nicotine dependence: Secondary | ICD-10-CM | POA: Insufficient documentation

## 2014-12-05 DIAGNOSIS — Z7982 Long term (current) use of aspirin: Secondary | ICD-10-CM | POA: Insufficient documentation

## 2014-12-05 DIAGNOSIS — I440 Atrioventricular block, first degree: Secondary | ICD-10-CM | POA: Insufficient documentation

## 2014-12-05 DIAGNOSIS — M7541 Impingement syndrome of right shoulder: Secondary | ICD-10-CM | POA: Insufficient documentation

## 2014-12-05 DIAGNOSIS — M75121 Complete rotator cuff tear or rupture of right shoulder, not specified as traumatic: Secondary | ICD-10-CM | POA: Diagnosis present

## 2014-12-05 DIAGNOSIS — Z888 Allergy status to other drugs, medicaments and biological substances status: Secondary | ICD-10-CM | POA: Insufficient documentation

## 2014-12-05 DIAGNOSIS — K219 Gastro-esophageal reflux disease without esophagitis: Secondary | ICD-10-CM | POA: Insufficient documentation

## 2014-12-05 DIAGNOSIS — J45909 Unspecified asthma, uncomplicated: Secondary | ICD-10-CM | POA: Insufficient documentation

## 2014-12-05 DIAGNOSIS — Z91030 Bee allergy status: Secondary | ICD-10-CM | POA: Insufficient documentation

## 2014-12-05 DIAGNOSIS — F419 Anxiety disorder, unspecified: Secondary | ICD-10-CM | POA: Insufficient documentation

## 2014-12-05 DIAGNOSIS — E119 Type 2 diabetes mellitus without complications: Secondary | ICD-10-CM | POA: Insufficient documentation

## 2014-12-05 DIAGNOSIS — E039 Hypothyroidism, unspecified: Secondary | ICD-10-CM | POA: Insufficient documentation

## 2014-12-05 DIAGNOSIS — Z885 Allergy status to narcotic agent status: Secondary | ICD-10-CM | POA: Insufficient documentation

## 2014-12-05 HISTORY — DX: Complete rotator cuff tear or rupture of right shoulder, not specified as traumatic: M75.121

## 2014-12-05 HISTORY — DX: Presence of spectacles and contact lenses: Z97.3

## 2014-12-05 HISTORY — DX: Anxiety disorder, unspecified: F41.9

## 2014-12-05 HISTORY — PX: SHOULDER ARTHROSCOPY WITH ROTATOR CUFF REPAIR AND SUBACROMIAL DECOMPRESSION: SHX5686

## 2014-12-05 HISTORY — DX: Gastro-esophageal reflux disease without esophagitis: K21.9

## 2014-12-05 LAB — GLUCOSE, CAPILLARY: GLUCOSE-CAPILLARY: 146 mg/dL — AB (ref 70–99)

## 2014-12-05 LAB — POCT HEMOGLOBIN-HEMACUE: Hemoglobin: 9.7 g/dL — ABNORMAL LOW (ref 12.0–15.0)

## 2014-12-05 SURGERY — SHOULDER ARTHROSCOPY WITH ROTATOR CUFF REPAIR AND SUBACROMIAL DECOMPRESSION
Anesthesia: General | Site: Shoulder | Laterality: Right

## 2014-12-05 MED ORDER — ONDANSETRON HCL 4 MG/2ML IJ SOLN: 4.0000 mg | Freq: Once | INTRAMUSCULAR | Status: DC | PRN

## 2014-12-05 MED ORDER — ONDANSETRON HCL 4 MG PO TABS: 4.0000 mg | ORAL_TABLET | Freq: Three times a day (TID) | ORAL | Status: DC | PRN

## 2014-12-05 MED ORDER — MIDAZOLAM HCL 2 MG/2ML IJ SOLN
INTRAMUSCULAR | Status: AC
  Filled 2014-12-05: qty 2

## 2014-12-05 MED ORDER — MIDAZOLAM HCL 2 MG/2ML IJ SOLN
1.0000 mg | INTRAMUSCULAR | Status: DC | PRN
  Administered 2014-12-05: 1 mg via INTRAVENOUS

## 2014-12-05 MED ORDER — DEXTROSE 5 % IV SOLN
3.0000 g | INTRAVENOUS | Status: AC
  Administered 2014-12-05: 3 g via INTRAVENOUS

## 2014-12-05 MED ORDER — SENNA-DOCUSATE SODIUM 8.6-50 MG PO TABS: 2.0000 | ORAL_TABLET | Freq: Every day | ORAL | Status: DC

## 2014-12-05 MED ORDER — ROPIVACAINE HCL 5 MG/ML IJ SOLN
INTRAMUSCULAR | Status: DC | PRN
  Administered 2014-12-05: 25 mL via PERINEURAL

## 2014-12-05 MED ORDER — FENTANYL CITRATE 0.05 MG/ML IJ SOLN
INTRAMUSCULAR | Status: DC | PRN
  Administered 2014-12-05 (×3): 25 ug via INTRAVENOUS
  Administered 2014-12-05 (×2): 50 ug via INTRAVENOUS
  Administered 2014-12-05: 25 ug via INTRAVENOUS

## 2014-12-05 MED ORDER — CEFAZOLIN SODIUM 1-5 GM-% IV SOLN
INTRAVENOUS | Status: AC
  Filled 2014-12-05: qty 50

## 2014-12-05 MED ORDER — BUPIVACAINE HCL (PF) 0.25 % IJ SOLN
INTRAMUSCULAR | Status: AC
  Filled 2014-12-05: qty 30

## 2014-12-05 MED ORDER — ONDANSETRON HCL 4 MG/2ML IJ SOLN
INTRAMUSCULAR | Status: DC | PRN
  Administered 2014-12-05: 4 mg via INTRAVENOUS

## 2014-12-05 MED ORDER — PROPOFOL 10 MG/ML IV BOLUS
INTRAVENOUS | Status: DC | PRN
  Administered 2014-12-05: 30 mg via INTRAVENOUS
  Administered 2014-12-05: 200 mg via INTRAVENOUS
  Administered 2014-12-05: 30 mg via INTRAVENOUS

## 2014-12-05 MED ORDER — CEFAZOLIN SODIUM-DEXTROSE 2-3 GM-% IV SOLR
INTRAVENOUS | Status: AC
  Filled 2014-12-05: qty 50

## 2014-12-05 MED ORDER — BUPIVACAINE HCL (PF) 0.5 % IJ SOLN
INTRAMUSCULAR | Status: AC
  Filled 2014-12-05: qty 30

## 2014-12-05 MED ORDER — OXYCODONE-ACETAMINOPHEN 10-325 MG PO TABS: 1.0000 | ORAL_TABLET | Freq: Four times a day (QID) | ORAL | Status: DC | PRN

## 2014-12-05 MED ORDER — LIDOCAINE HCL (CARDIAC) 20 MG/ML IV SOLN
INTRAVENOUS | Status: DC | PRN
Start: 2014-12-05 — End: 2014-12-05
  Administered 2014-12-05: 50 mg via INTRAVENOUS

## 2014-12-05 MED ORDER — OXYCODONE HCL 5 MG PO TABS
ORAL_TABLET | ORAL | Status: AC
  Filled 2014-12-05: qty 1

## 2014-12-05 MED ORDER — FENTANYL CITRATE 0.05 MG/ML IJ SOLN
INTRAMUSCULAR | Status: AC
  Filled 2014-12-05: qty 2

## 2014-12-05 MED ORDER — GLYCOPYRROLATE 0.2 MG/ML IJ SOLN
INTRAMUSCULAR | Status: DC | PRN
  Administered 2014-12-05: 0.2 mg via INTRAVENOUS

## 2014-12-05 MED ORDER — SODIUM CHLORIDE 0.9 % IR SOLN
Status: DC | PRN
  Administered 2014-12-05: 6000 mL

## 2014-12-05 MED ORDER — OXYCODONE HCL 5 MG/5ML PO SOLN: 5.0000 mg | Freq: Once | ORAL | Status: AC | PRN

## 2014-12-05 MED ORDER — LACTATED RINGERS IV SOLN
INTRAVENOUS | Status: DC
  Administered 2014-12-05 (×2): via INTRAVENOUS

## 2014-12-05 MED ORDER — HYDROMORPHONE HCL 1 MG/ML IJ SOLN
INTRAMUSCULAR | Status: AC
  Filled 2014-12-05: qty 1

## 2014-12-05 MED ORDER — BACLOFEN 10 MG PO TABS: 10.0000 mg | ORAL_TABLET | Freq: Three times a day (TID) | ORAL | Status: DC

## 2014-12-05 MED ORDER — OXYCODONE HCL 5 MG PO TABS
5.0000 mg | ORAL_TABLET | Freq: Once | ORAL | Status: AC | PRN
  Administered 2014-12-05: 5 mg via ORAL

## 2014-12-05 MED ORDER — FENTANYL CITRATE 0.05 MG/ML IJ SOLN
INTRAMUSCULAR | Status: AC
  Filled 2014-12-05: qty 6

## 2014-12-05 MED ORDER — PHENYLEPHRINE HCL 10 MG/ML IJ SOLN
INTRAMUSCULAR | Status: DC | PRN
  Administered 2014-12-05 (×2): 40 ug via INTRAVENOUS

## 2014-12-05 MED ORDER — FENTANYL CITRATE 0.05 MG/ML IJ SOLN
50.0000 ug | INTRAMUSCULAR | Status: DC | PRN
  Administered 2014-12-05: 50 ug via INTRAVENOUS

## 2014-12-05 MED ORDER — SUCCINYLCHOLINE CHLORIDE 20 MG/ML IJ SOLN
INTRAMUSCULAR | Status: DC | PRN
  Administered 2014-12-05: 50 mg via INTRAVENOUS

## 2014-12-05 MED ORDER — HYDROMORPHONE HCL 1 MG/ML IJ SOLN
0.2500 mg | INTRAMUSCULAR | Status: DC | PRN
  Administered 2014-12-05 (×2): 0.5 mg via INTRAVENOUS

## 2014-12-05 SURGICAL SUPPLY — 64 items
ANCH SUT SWLK 19.1X4.75 VT (Anchor) ×4 IMPLANT
ANCHOR PEEK 4.75X19.1 SWLK C (Anchor) ×4 IMPLANT
BLADE CUTTER GATOR 3.5 (BLADE) ×3 IMPLANT
BLADE GREAT WHITE 4.2 (BLADE) IMPLANT
BLADE SURG 15 STRL LF DISP TIS (BLADE) IMPLANT
BLADE SURG 15 STRL SS (BLADE)
BUR OVAL 6.0 (BURR) ×2 IMPLANT
CANNULA 5.75X71 LONG (CANNULA) ×3 IMPLANT
CANNULA TWIST IN 8.25X7CM (CANNULA) ×4 IMPLANT
CLSR STERI-STRIP ANTIMIC 1/2X4 (GAUZE/BANDAGES/DRESSINGS) ×3 IMPLANT
DECANTER SPIKE VIAL GLASS SM (MISCELLANEOUS) IMPLANT
DRAPE INCISE IOBAN 66X45 STRL (DRAPES) ×3 IMPLANT
DRAPE SHOULDER BEACH CHAIR (DRAPES) ×3 IMPLANT
DRAPE U 20/CS (DRAPES) ×3 IMPLANT
DRAPE U-SHAPE 47X51 STRL (DRAPES) ×3 IMPLANT
DRSG PAD ABDOMINAL 8X10 ST (GAUZE/BANDAGES/DRESSINGS) ×3 IMPLANT
DURAPREP 26ML APPLICATOR (WOUND CARE) ×3 IMPLANT
ELECT REM PT RETURN 9FT ADLT (ELECTROSURGICAL) ×3
ELECTRODE REM PT RTRN 9FT ADLT (ELECTROSURGICAL) ×2 IMPLANT
FIBERSTICK 2 (SUTURE) IMPLANT
GAUZE SPONGE 4X4 12PLY STRL (GAUZE/BANDAGES/DRESSINGS) ×3 IMPLANT
GLOVE BIO SURGEON STRL SZ8 (GLOVE) ×3 IMPLANT
GLOVE BIOGEL PI IND STRL 7.0 (GLOVE) ×1 IMPLANT
GLOVE BIOGEL PI IND STRL 8 (GLOVE) ×4 IMPLANT
GLOVE BIOGEL PI INDICATOR 7.0 (GLOVE) ×1
GLOVE BIOGEL PI INDICATOR 8 (GLOVE) ×2
GLOVE ECLIPSE 6.5 STRL STRAW (GLOVE) ×2 IMPLANT
GLOVE ORTHO TXT STRL SZ7.5 (GLOVE) ×3 IMPLANT
GOWN STRL REUS W/ TWL LRG LVL3 (GOWN DISPOSABLE) ×2 IMPLANT
GOWN STRL REUS W/ TWL XL LVL3 (GOWN DISPOSABLE) ×4 IMPLANT
GOWN STRL REUS W/TWL LRG LVL3 (GOWN DISPOSABLE) ×3
GOWN STRL REUS W/TWL XL LVL3 (GOWN DISPOSABLE) ×6
IMMOBILIZER SHOULDER FOAM XLGE (SOFTGOODS) IMPLANT
KIT SHOULDER TRACTION (DRAPES) ×3 IMPLANT
LASSO 90 CVE QUICKPAS (DISPOSABLE) IMPLANT
MANIFOLD NEPTUNE II (INSTRUMENTS) ×3 IMPLANT
NDL SCORPION MULTI FIRE (NEEDLE) ×1 IMPLANT
NEEDLE SCORPION MULTI FIRE (NEEDLE) ×3 IMPLANT
PACK ARTHROSCOPY DSU (CUSTOM PROCEDURE TRAY) ×3 IMPLANT
PACK BASIN DAY SURGERY FS (CUSTOM PROCEDURE TRAY) ×3 IMPLANT
SET ARTHROSCOPY TUBING (MISCELLANEOUS) ×3
SET ARTHROSCOPY TUBING LN (MISCELLANEOUS) ×2 IMPLANT
SHEET MEDIUM DRAPE 40X70 STRL (DRAPES) ×3 IMPLANT
SLEEVE SCD COMPRESS KNEE MED (MISCELLANEOUS) ×3 IMPLANT
SLING ARM IMMOBILIZER LRG (SOFTGOODS) ×2 IMPLANT
SLING ARM IMMOBILIZER MED (SOFTGOODS) ×2 IMPLANT
SLING ARM LRG ADULT FOAM STRAP (SOFTGOODS) IMPLANT
SLING ARM MED ADULT FOAM STRAP (SOFTGOODS) IMPLANT
SLING ARM XL FOAM STRAP (SOFTGOODS) IMPLANT
SUT FIBERWIRE #2 38 T-5 BLUE (SUTURE)
SUT MNCRL AB 4-0 PS2 18 (SUTURE) IMPLANT
SUT PDS AB 0 CT 36 (SUTURE) ×3 IMPLANT
SUT PDS AB 1 CT  36 (SUTURE)
SUT PDS AB 1 CT 36 (SUTURE) IMPLANT
SUT TIGER TAPE 7 IN WHITE (SUTURE) ×2 IMPLANT
SUT VIC AB 3-0 SH 27 (SUTURE)
SUT VIC AB 3-0 SH 27X BRD (SUTURE) IMPLANT
SUTURE FIBERWR #2 38 T-5 BLUE (SUTURE) IMPLANT
TAPE FIBER 2MM 7IN #2 BLUE (SUTURE) ×2 IMPLANT
TOWEL OR 17X24 6PK STRL BLUE (TOWEL DISPOSABLE) ×3 IMPLANT
TOWEL OR NON WOVEN STRL DISP B (DISPOSABLE) ×3 IMPLANT
TUBE CONNECTING 20X1/4 (TUBING) ×3 IMPLANT
WAND STAR VAC 90 (SURGICAL WAND) ×3 IMPLANT
WATER STERILE IRR 1000ML POUR (IV SOLUTION) ×3 IMPLANT

## 2014-12-05 NOTE — Discharge Instructions (Signed)
Diet: As you were doing prior to hospitalization   Shower:  May shower but keep the wounds dry, use an occlusive plastic wrap, NO SOAKING IN TUB.  If the bandage gets wet, change with a clean dry gauze.  Dressing:  You may change your dressing 3-5 days after surgery.  Then change the dressing daily with sterile gauze dressing.    There are sticky tapes (steri-strips) on your wounds and all the stitches are absorbable.  Leave the steri-strips in place when changing your dressings, they will peel off with time, usually 2-3 weeks.  Activity:  Increase activity slowly as tolerated, but follow the weight bearing instructions below.  No lifting or driving for 6 weeks.  Weight Bearing:   Sling at all times except hygiene..    To prevent constipation: you may use a stool softener such as -  Colace (over the counter) 100 mg by mouth twice a day  Drink plenty of fluids (prune juice may be helpful) and high fiber foods Miralax (over the counter) for constipation as needed.    Itching:  If you experience itching with your medications, try taking only a single pain pill, or even half a pain pill at a time.  You may take up to 10 pain pills per day, and you can also use benadryl over the counter for itching or also to help with sleep.   Precautions:  If you experience chest pain or shortness of breath - call 911 immediately for transfer to the hospital emergency department!!  If you develop a fever greater that 101 F, purulent drainage from wound, increased redness or drainage from wound, or calf pain -- Call the office at [phone]                                                Follow- Up Appointment:  Please call for an appointment to be seen in 2 weeks [place] - [phone]   Regional Anesthesia Blocks  1. Numbness or the inability to move the "blocked" extremity may last from 3-48 hours after placement. The length of time depends on the medication injected and your individual response to  the medication. If the numbness is not going away after 48 hours, call your surgeon.  2. The extremity that is blocked will need to be protected until the numbness is gone and the  Strength has returned. Because you cannot feel it, you will need to take extra care to avoid injury. Because it may be weak, you may have difficulty moving it or using it. You may not know what position it is in without looking at it while the block is in effect.  3. For blocks in the legs and feet, returning to weight bearing and walking needs to be done carefully. You will need to wait until the numbness is entirely gone and the strength has returned. You should be able to move your leg and foot normally before you try and bear weight or walk. You will need someone to be with you when you first try to ensure you do not fall and possibly risk injury.  4. Bruising and tenderness at the needle site are common side effects and will resolve in a few days.  5. Persistent numbness or new problems with movement should be communicated to the surgeon or the [place] [phone] [facility] [phone]).  Post Anesthesia Home Care Instructions  Activity: Get plenty of rest for the remainder of the day. A responsible adult should stay with you for 24 hours following the procedure.  For the next 24 hours, DO NOT: -Drive a car -Paediatric nurse -Drink alcoholic beverages -Take any medication unless instructed by your physician -Make any legal decisions or sign important papers.  Meals: Start with liquid foods such as gelatin or soup. Progress to regular foods as tolerated. Avoid greasy, spicy, heavy foods. If nausea and/or vomiting occur, drink only clear liquids until the nausea and/or vomiting subsides. Call your physician if vomiting continues.  Special Instructions/Symptoms: Your throat may feel dry or sore from the anesthesia or the breathing tube placed in your throat during surgery.  If this causes discomfort, gargle with warm salt water. The discomfort should disappear within 24 hours.

## 2014-12-05 NOTE — H&P (Signed)
PREOPERATIVE H&P  Chief Complaint: Right shoulder pain  HPI: [name] is a 64 y.o. female who presents for preoperative history and physical with a diagnosis of right shoulder full-thickness rotator cuff tear Symptoms are rated as moderate to severe, and have been worsening.  This is significantly impairing activities of daily living.  She has elected for surgical management. 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 She has failed conservative measures.  Past Medical History  Diagnosis Date  . Asthma   . Diverticulitis   . Diverticulosis   . Thyroid disease     hypo  . Hypertension   . Diabetes mellitus     not on meds.  . Hypothyroidism   . Migraine   . Anemia     none recently  . Depression   . Wears glasses   . GERD (gastroesophageal reflux disease)   . Anxiety    Past Surgical History  Procedure Laterality Date  . Cesarean section    . Abdominal hysterectomy    . Knee cartilage surgery      Left  . Cataract extraction, bilateral    . Colonoscopy      multiple   . Esophagogastroduodenoscopy endoscopy      multiple  . Right foot surgery  little toe and next toe    x 3  . Laparoscopic partial colectomy N/A 04/22/2013    Procedure: LAPAROSCOPIC PARTIAL COLECTOMY;  Surgeon: Odis Hollingshead, MD;  Location: [facility];  Service: General;  Laterality: N/A;   History   Social History  . Marital Status: Single    Spouse Name: N/A    Number of Children: N/A  . Years of Education: N/A   Social History Main Topics  . Smoking status: Former Smoker -- 0.50 packs/day for 6 years    Types: Cigarettes    Quit date: 11/14/1982  . Smokeless tobacco: Never Used     Comment: quit smoking 30 years ago  . Alcohol Use: Yes     Comment:  very rare   . Drug Use: No  . Sexual Activity: None   Other Topics Concern  . None   Social History Narrative   Family History  Problem Relation Age of Onset   . Heart disease Mother   . Kidney failure Father   . Diabetes Father   . Breast cancer Paternal Aunt   . Colon cancer Neg Hx    Allergies  Allergen Reactions  . Lisinopril Anaphylaxis    Possibly Lisinopril or Levaquin, pt was taking both at the time of reaction  . Bee Venom Swelling  . Morphine And Related Itching    Needs benadryl prior to    Prior to Admission medications   Medication Sig Start Date End Date Taking? Authorizing Provider  albuterol (PROVENTIL HFA;VENTOLIN HFA) 108 (90 BASE) MCG/ACT inhaler Inhale 2 puffs into the lungs every 6 (six) hours as needed for wheezing. For wheezing 10/31/14  Yes Elby Showers, MD  ALPRAZolam Duanne Moron) 0.5 MG tablet TAKE 1 TABLET BY MOUTH TWICE DAILY AS NEEDED 10/31/14  Yes Elby Showers, MD  Ascorbic Acid (VITAMIN C) 1000 MG tablet Take 1,000 mg by mouth daily.   Yes Historical Provider, MD  aspirin 81 MG tablet Take 81 mg by mouth daily.     Yes Historical Provider, MD  aspirin-acetaminophen-caffeine (EXCEDRIN MIGRAINE) [phone] MG per tablet Take 1 tablet by mouth every 6 (six)  hours as needed. For pain/headache   Yes Historical Provider, MD  cetirizine (ZYRTEC) 10 MG tablet Take 10 mg by mouth at bedtime.    Yes Historical Provider, MD  Cholecalciferol (VITAMIN D) 2000 UNITS tablet Take 2,000 Units by mouth daily.     Yes Historical Provider, MD  escitalopram (LEXAPRO) 10 MG tablet Take 1 tablet (10 mg total) by mouth daily. Patient taking differently: Take 10 mg by mouth at bedtime.  04/10/14  Yes Elby Showers, MD  esomeprazole (NEXIUM) 40 MG capsule TAKE 1 CAPSULE BY MOUTH TWICE DAILY 05/15/14  Yes Elby Showers, MD  famotidine (PEPCID) 20 MG tablet Take 20 mg by mouth at bedtime.     Yes Historical Provider, MD  furosemide (LASIX) 40 MG tablet Take 1 tablet (40 mg total) by mouth daily. 12/01/14  Yes Elby Showers, MD  levothyroxine (SYNTHROID, LEVOTHROID) 200 MCG tablet Take 1 tablet (200 mcg total) by mouth daily before breakfast.  10/22/14  Yes Elby Showers, MD  losartan (COZAAR) 50 MG tablet TAKE 1 TABLET BY MOUTH ONCE DAILY FOR HYPERTENSION 07/22/14  Yes Elby Showers, MD  metoprolol tartrate (LOPRESSOR) 25 MG tablet Take 1 tablet (25 mg total) by mouth every morning. 10/22/14  Yes Elby Showers, MD  OSCIMIN SR 0.375 MG 12 hr tablet TAKE 1 TABLET BY MOUTH TWICE DAILY 08/11/14  Yes Elby Showers, MD  simvastatin (ZOCOR) 10 MG tablet Take 10 mg by mouth at bedtime.   Yes Historical Provider, MD  traZODone (DESYREL) 150 MG tablet TAKE 1 TABLET BY MOUTH AT BEDTIME 08/11/14  Yes Elby Showers, MD  vitamin A 10000 UNIT capsule Take 10,000 Units by mouth daily.   Yes Historical Provider, MD  vitamin B-12 (CYANOCOBALAMIN) 100 MCG tablet Take 100 mcg by mouth every morning.    Yes Historical Provider, MD  zolpidem (AMBIEN) 10 MG tablet TAKE 1 TABLET BY MOUTH ONCE DAILY AT BEDTIME 10/31/14  Yes Elby Showers, MD  ZYFLO CR 600 MG CR tablet TAKE 2 TABLETS BY MOUTH TWICE DAILY 02/12/14  Yes Elby Showers, MD  EPINEPHrine (EPI-PEN) 0.3 mg/0.3 mL SOAJ injection Inject 0.3 mLs (0.3 mg total) into the muscle once. 10/01/13   Elby Showers, MD  folic acid (FOLVITE) 1 MG tablet Take 1 mg by mouth daily.    Historical Provider, MD  HYDROcodone-acetaminophen (NORCO) 10-325 MG per tablet Take 1 tablet by mouth every 6 (six) hours as needed. 10/01/13   Elby Showers, MD  HYDROcodone-homatropine [facility]) 5-1.5 MG/5ML syrup Take 5 mLs by mouth every 8 (eight) hours as needed for cough. 01/21/14   Elby Showers, MD  pyridOXINE (VITAMIN B-6) 50 MG tablet Take 50 mg by mouth daily.    Historical Provider, MD  zinc gluconate 50 MG tablet Take 50 mg by mouth daily.    Historical Provider, MD     Positive ROS: All other systems have been reviewed and were otherwise negative with the exception of those mentioned in the HPI and as above.  Physical Exam: General: Alert, no acute distress Cardiovascular: No pedal edema Respiratory: No cyanosis, no use of  accessory musculature GI: No organomegaly, abdomen is soft and non-tender Skin: No lesions in the area of chief complaint Neurologic: Sensation intact distally Psychiatric: Patient is competent for consent with normal mood and affect Lymphatic: No axillary or cervical lymphadenopathy  MUSCULOSKELETAL: Right shoulder active motion is 0-150. Positive drop arm sign. Before meals joint is nontender. Positive  impingement signs.  Assessment: Right shoulder full-thickness rotator cuff tear with some degenerative glenohumeral changes, chronic  Plan: Plan for Procedure(s): RIGHT SHOULDER ARTHROSCOPY,ACROMOPLASTY, ROTATOR CUFF REPAIR, debridement  The risks benefits and alternatives were discussed with the patient including but not limited to the risks of nonoperative treatment, versus surgical intervention including infection, bleeding, nerve injury,  blood clots, cardiopulmonary complications, morbidity, mortality, among others, and they were willing to proceed.   [name], MD Cell [phone]   12/05/2014 7:18 AM

## 2014-12-05 NOTE — Op Note (Signed)
12/05/2014  9:38 AM  PATIENT:  [name]    PRE-OPERATIVE DIAGNOSIS:  Right shoulder full-thickness rotator cuff tear with impingement syndrome  POST-OPERATIVE DIAGNOSIS:  Same  PROCEDURE:  RIGHT SHOULDER ARTHROSCOPY,ACROMOPLASTY, ROTATOR CUFF REPAIR, extensive debridement  SURGEON:  [name], MD  PHYSICIAN ASSISTANT: Joya Gaskins, OPA-C, present and scrubbed throughout the case, critical for completion in a timely fashion, and for retraction, instrumentation, and closure.  ANESTHESIA:   General  PREOPERATIVE INDICATIONS:  [name] is a  64 y.o. female with a diagnosis of right shoulder full-thickness chronic rotator cuff tear who failed conservative measures and elected for surgical management.    The risks benefits and alternatives were discussed with the patient preoperatively including but not limited to the risks of infection, bleeding, nerve injury, cardiopulmonary complications, the need for revision surgery, among others, and the patient was willing to proceed.  OPERATIVE IMPLANTS: Arthrex bio composite 4.75 swivel lock 1 for the posterior cuff and an Arthrex 4.75 mm peek anchors anteriorly with an inverted fiber tape in an inverted fiber wire for the anterior cuff.  OPERATIVE FINDINGS: There was extensive chronic delaminated rotator cuff tear with bilaminar tearing of the supraspinatus and infraspinatus. There was significant subacromial spurring. The bone quality was fair, tendon quality was mediocre, the tendon was fairly mobile. This measured about 2 cm x 1.5 cm retraction. The biceps tendon had about 20% fraying, but the biceps pulley and subscapularis was intact. There was a moderate amount of chondral damage to the humeral head, and a little bit on the glenoid as well. This is probably grade 3 on the humeral head and an uncontained pattern, and grade 1 on the glenoid.  OPERATIVE PROCEDURE: The patient was brought to the operating room and placed in  the supine position. Gen. anesthesia was administered. IV antibiotics were given. She was in a semilateral decubitus position and all bony prominences were padded. The right upper extremity was prepped and draped in usual sterile fashion. Time out performed. Diagnostic arthroscopy carried out with the above-named findings after an exam under anesthesia prior to the prep. I used the arthroscopic shaver to debride a small portion of the biceps tendon, as well as to perform a chondroplasty on the humeral head, and remove any stump of tendon on the tuberosity.  I then went to the subacromial space, and performed a complete bursectomy, as well as CA ligament release and moderate acromioplasty. I also performed a moderate tubercleplasty preparing the tuberosity for insertion. The tendon was also debrided removing the chronic bulbous ends, and then I placed tension on the inferior retracted delaminated portion, such that it reduced anatomically with the superior portion, and then used a bird beak suture passer to deliver the fiber tape posteriorly through both lamina. This was in a horizontal mattress configuration. Joya Gaskins assisted with the tensioning of the cuff while I effectively sutured the 2 lamina back together again.  I then used a scorpion suture passer from laterally to pass another fiber tape more anteriorly as well as the FiberWire at the most anterior portion taking care to avoid entrapment of the biceps tendon.  I then secured the posterior cuff with a anchor, anatomically reducing it, and then advanced the anterior cuff to the bone as well. This is effectively a L-shaped tear with bilaminar component with retraction off of the anterior corner.  I then touched up the acromioplasty from posteriorly, while viewing from laterally, and then was satisfied with the repair with excellent re-apposition  to bone.  The instruments were removed and the portals closed with Monocryl followed by Steri-Strips  and sterile gauze. She was awakened and returned to the PACU in stable and satisfactory condition. There were no couple occasions and she tolerated the procedure well.

## 2014-12-05 NOTE — Anesthesia Procedure Notes (Addendum)
Anesthesia Regional Block:  Interscalene brachial plexus block  Pre-Anesthetic Checklist: ,, timeout performed, Correct Patient, Correct Site, Correct Laterality, Correct Procedure, Correct Position, site marked, Risks and benefits discussed,  Surgical consent,  Pre-op evaluation,  At surgeon's request and post-op pain management  Laterality: Right and Upper  Prep: chloraprep       Needles:  Injection technique: Single-shot  Needle Type: Echogenic Needle     Needle Length: 5cm 5 cm Needle Gauge: 21 and 21 G    Additional Needles:  Procedures: ultrasound guided (picture in chart) Interscalene brachial plexus block Narrative:  Start time: 12/05/2014 7:13 AM End time: 12/05/2014 7:19 AM Injection made incrementally with aspirations every 5 mL.  Performed by: Personally  Anesthesiologist: CREWS, DAVID A   Procedure Name: Intubation Date/Time: 12/05/2014 7:43 AM Performed by: Marrianne Mood Pre-anesthesia Checklist: Patient identified, Emergency Drugs available, Suction available, Patient being monitored and Timeout performed Patient Re-evaluated:Patient Re-evaluated prior to inductionOxygen Delivery Method: Circle System Utilized Preoxygenation: Pre-oxygenation with 100% oxygen Intubation Type: IV induction Ventilation: Mask ventilation without difficulty Laryngoscope Size: Miller and 3 Grade View: Grade III Tube type: Oral Tube size: 7.0 mm Number of attempts: 1 Airway Equipment and Method: Stylet and Oral airway Placement Confirmation: ETT inserted through vocal cords under direct vision,  positive ETCO2 and breath sounds checked- equal and bilateral Secured at: 21 cm Tube secured with: Tape Dental Injury: Teeth and Oropharynx as per pre-operative assessment

## 2014-12-05 NOTE — Transfer of Care (Signed)
Immediate Anesthesia Transfer of Care Note  Patient: [name]  Procedure(s) Performed: Procedure(s): RIGHT SHOULDER ARTHROSCOPY,ACROMOPLASTY, ROTATOR CUFF REPAIR (Right)  Patient Location: PACU  Anesthesia Type:GA combined with regional for post-op pain  Level of Consciousness: awake  Airway & Oxygen Therapy: Patient Spontanous Breathing and Patient connected to face mask oxygen  Post-op Assessment: Report given to PACU RN and Post -op Vital signs reviewed and stable  Post vital signs: Reviewed and stable  Complications: No apparent anesthesia complications

## 2014-12-05 NOTE — Progress Notes (Signed)
Assisted Dr. Crews with right, ultrasound guided, interscalene  block. Side rails up, monitors on throughout procedure. See vital signs in flow sheet. Tolerated Procedure well. 

## 2014-12-05 NOTE — Anesthesia Preprocedure Evaluation (Signed)
Anesthesia Evaluation  Patient identified by MRN, date of birth, ID band Patient awake    Reviewed: Allergy & Precautions, NPO status , Patient's Chart, lab work & pertinent test results  Airway Mallampati: II  TM Distance: >3 FB Neck ROM: Full    Dental  (+) Teeth Intact, Dental Advisory Given   Pulmonary former smoker,  breath sounds clear to auscultation        Cardiovascular hypertension, Pt. on medications Rhythm:Regular Rate:Normal     Neuro/Psych    GI/Hepatic GERD-  Medicated and Controlled,  Endo/Other  diabetes, Well Controlled, Type 2, Oral Hypoglycemic AgentsMorbid obesity  Renal/GU      Musculoskeletal   Abdominal   Peds  Hematology   Anesthesia Other Findings   Reproductive/Obstetrics                             Anesthesia Physical Anesthesia Plan  ASA: III  Anesthesia Plan: General   Post-op Pain Management:    Induction: Intravenous  Airway Management Planned: Oral ETT  Additional Equipment:   Intra-op Plan:   Post-operative Plan: Extubation in OR  Informed Consent: I have reviewed the patients History and Physical, chart, labs and discussed the procedure including the risks, benefits and alternatives for the proposed anesthesia with the patient or authorized representative who has indicated his/her understanding and acceptance.   Dental advisory given  Plan Discussed with: CRNA, Anesthesiologist and Surgeon  Anesthesia Plan Comments:         Anesthesia Quick Evaluation

## 2014-12-05 NOTE — Anesthesia Postprocedure Evaluation (Signed)
  Anesthesia Post-op Note  Patient: [name]  Procedure(s) Performed: Procedure(s): RIGHT SHOULDER ARTHROSCOPY,ACROMOPLASTY, ROTATOR CUFF REPAIR (Right)  Patient Location: PACU  Anesthesia Type: General   Level of Consciousness: awake, alert  and oriented  Airway and Oxygen Therapy: Patient Spontanous Breathing  Post-op Pain: mild  Post-op Assessment: Post-op Vital signs reviewed  Post-op Vital Signs: Reviewed  Last Vitals:  Filed Vitals:   12/05/14 1030  BP: 108/54  Pulse: 56  Temp:   Resp: 13    Complications: No apparent anesthesia complications

## 2014-12-08 ENCOUNTER — Encounter (HOSPITAL_BASED_OUTPATIENT_CLINIC_OR_DEPARTMENT_OTHER): Payer: Self-pay | Admitting: Orthopedic Surgery

## 2014-12-17 ENCOUNTER — Telehealth: Payer: Self-pay | Admitting: *Deleted

## 2014-12-17 NOTE — Telephone Encounter (Signed)
Patient returned call states she is improving everyday she states she will call us back and let us know when she is ready to be screened for sleep apnea.

## 2015-01-12 ENCOUNTER — Encounter: Payer: Self-pay | Admitting: Physical Therapy

## 2015-01-12 ENCOUNTER — Ambulatory Visit (INDEPENDENT_AMBULATORY_CARE_PROVIDER_SITE_OTHER): Payer: 59 | Admitting: Physical Therapy

## 2015-01-12 DIAGNOSIS — R531 Weakness: Secondary | ICD-10-CM

## 2015-01-12 DIAGNOSIS — M25619 Stiffness of unspecified shoulder, not elsewhere classified: Secondary | ICD-10-CM

## 2015-01-12 DIAGNOSIS — M25511 Pain in right shoulder: Secondary | ICD-10-CM

## 2015-01-12 NOTE — Patient Instructions (Signed)
Pt given HEP including pendulum, cane flexion and IR/ER, isometrics

## 2015-01-12 NOTE — Therapy (Signed)
[place], [place], [address] Phone: [phone]   Fax:  [phone]  Physical Therapy Evaluation  Patient Details  Name: [name] MRN: [id number] Date of Birth: 1951-09-08 Referring Provider:  [name], MD  Encounter Date: 01/12/2015      PT End of Session - 01/12/15 1221    Visit Number 1   Number of Visits 12   Date for PT Re-Evaluation 02/23/15   PT Start Time 2725   PT Stop Time 1232   PT Time Calculation (min) 47 min      Past Medical History  Diagnosis Date  . Asthma   . Diverticulitis   . Diverticulosis   . Thyroid disease     hypo  . Hypertension   . Diabetes mellitus     not on meds.  . Hypothyroidism   . Migraine   . Anemia     none recently  . Depression   . Wears glasses   . GERD (gastroesophageal reflux disease)   . Anxiety   . Complete tear of right rotator cuff 12/05/2014    Past Surgical History  Procedure Laterality Date  . Cesarean section    . Abdominal hysterectomy    . Knee cartilage surgery      Left  . Cataract extraction, bilateral    . Colonoscopy      multiple   . Esophagogastroduodenoscopy endoscopy      multiple  . Right foot surgery  little toe and next toe    x 3  . Laparoscopic partial colectomy N/A 04/22/2013    Procedure: LAPAROSCOPIC PARTIAL COLECTOMY;  Surgeon: Odis Hollingshead, MD;  Location: [facility];  Service: General;  Laterality: N/A;  . Shoulder arthroscopy with rotator cuff repair and subacromial decompression Right 12/05/2014    Procedure: RIGHT SHOULDER ARTHROSCOPY,ACROMOPLASTY, ROTATOR CUFF REPAIR;  Surgeon: [name], MD;  Location: [facility];  Service: Orthopedics;  Laterality: Right;    There were no vitals taken for this visit.  Visit Diagnosis:  Pain in joint, shoulder region, right  Decreased range of motion (ROM) of shoulder  Weakness generalized      Subjective Assessment -  01/12/15 1151    Symptoms pt with recent rotator cuff repair surgery 12/05/14. since surgery pt has done no formal PT, has had help at home, has not been using Rt UE if it can be avoided, not driving   Patient Stated Goals be able to do housework, dressing, etc with Rt UE   Currently in Pain? Yes   Pain Score 3    Pain Location Shoulder   Pain Orientation Right   Pain Descriptors / Indicators Aching   Pain Type Surgical pain;Acute pain   Pain Onset More than a month ago   Pain Frequency Constant   Aggravating Factors  certain positions or motions   Pain Relieving Factors pain meds          [facility] PT Assessment - 01/12/15 0001    Assessment   Medical Diagnosis rt rotator cuff repair   Onset Date 12/05/14   Next MD Visit 02/05/15   Precautions   Required Braces or Orthoses Sling   Prior Function   Level of Independence Independent with basic ADLs;Independent with gait   Observation/Other Assessments   Observations rounded shoulders, slumped posture, very guarded of Rt UE   ROM / Strength   AROM / PROM / Strength PROM   PROM  PROM Assessment Site Shoulder   Right/Left Shoulder Right   Right Shoulder Flexion 90 Degrees   Right Shoulder ABduction 62 Degrees   Right Shoulder Internal Rotation 80 Degrees   Right Shoulder External Rotation 45 Degrees   Palpation   Palpation TTP anterior and lateral Rt shoulder                  [facility] Adult PT Treatment/Exercise - 01/12/15 0001    Exercises   Exercises Shoulder   Shoulder Exercises: ROM/Strengthening   Pendulum 2 x 30sec   Other ROM/Strengthening Exercises cane flexion and IR/ER x 5   Shoulder Exercises: Isometric Strengthening   Flexion 5X5"   Extension 5X5"   External Rotation 5X5"   ABduction 5X5"   Modalities   Modalities Cryotherapy   Cryotherapy   Number Minutes Cryotherapy 15 Minutes   Cryotherapy Location Shoulder   Type of Cryotherapy Other (comment)  vaso, 3 snowflakes, medium                 PT Education - 01/12/15 1216    Education provided Yes   Education Details HEP, PT POC   Person(s) Educated Patient   Methods Explanation;Demonstration;Handout   Comprehension Returned demonstration;Verbalized understanding             PT Long Term Goals - 01/12/15 1224    PT LONG TERM GOAL #1   Title pt will improve Rt UE ROM to [facility] to improve functional mobility with home tasks   PT LONG TERM GOAL #2   Title Pt will improve Rt UE strength to 4/5 in order to perform housework without pain   PT LONG TERM GOAL #3   Title Pt will decrease Rt shoulder pain to 1/10 with grooming activities   PT LONG TERM GOAL #4   Title Pt will be able to use Rt UE to brush teeth without increased symptoms               Plan - 01/12/15 1221    Clinical Impression Statement pt presents s/p rotator cuff repair with deficits in strength, ROM and self care and functional abilities. pt wishes to be able to perform self care and household activities without pain/difficulty   Pt will benefit from skilled therapeutic intervention in order to improve on the following deficits Decreased strength;Decreased range of motion;Pain;Impaired UE functional use   Rehab Potential Excellent   PT Frequency 2x / week   PT Duration 6 weeks   PT Treatment/Interventions Moist Heat;Cryotherapy;Electrical Stimulation;Patient/family education;Therapeutic activities;Therapeutic exercise;Neuromuscular re-education;Manual techniques   PT Next Visit Plan per protocol         Problem List Patient Active Problem List   Diagnosis Date Noted  . Complete tear of right rotator cuff 12/05/2014  . Wound healing, delayed 05/06/2013  . Candidal skin infection 05/06/2013  . H/O diverticulitis of SIGMOID and left colon s/p laparoscopic assisted left colectomy 04/22/13 02/15/2013  . Diverticulitis of proximal descending colon 02/07/2013  . Hypokalemia 01/26/2013  . Type 2 diabetes mellitus 08/13/2012  . Morbid obesity  08/13/2012  . Insomnia 08/13/2012  . History of ulcerative colitis 08/13/2012  . Musculoskeletal pain 08/13/2012  . Constipation 02/07/2012  . Hyperlipidemia 02/07/2012  . History of migraine headaches 02/07/2012  . Lichen planus 93/81/8299  . Allergic rhinitis 02/07/2012  . Angioedema 02/07/2012  . GERD 02/26/2010  . VITAMIN B12 DEFICIENCY 05/05/2008  . HYPOTHYROIDISM 04/29/2008  . DEPRESSION 04/29/2008  . HYPERTENSION 04/29/2008  . ASTHMA 04/29/2008  . DIVERTICULOSIS, COLON  12/11/2006  . H/O: hysterectomy 02/15/1998    Isabelle Course, PT, DPT  01/12/2015, 12:28 PM  [facility] [place], [place], [address] Phone: [phone]   Fax:  [phone]

## 2015-01-15 ENCOUNTER — Ambulatory Visit (INDEPENDENT_AMBULATORY_CARE_PROVIDER_SITE_OTHER): Payer: 59 | Admitting: Physical Therapy

## 2015-01-15 DIAGNOSIS — M7580 Other shoulder lesions, unspecified shoulder: Secondary | ICD-10-CM

## 2015-01-15 DIAGNOSIS — R531 Weakness: Secondary | ICD-10-CM

## 2015-01-15 DIAGNOSIS — M25619 Stiffness of unspecified shoulder, not elsewhere classified: Secondary | ICD-10-CM

## 2015-01-15 DIAGNOSIS — M25511 Pain in right shoulder: Secondary | ICD-10-CM

## 2015-01-15 NOTE — Therapy (Signed)
[place], [place], [address] Phone: [phone]   Fax:  [phone]  Physical Therapy Treatment  Patient Details  Name: [name] MRN: [id number] Date of Birth: [birth year] Referring Provider:  [name], MD  Encounter Date: 01/15/2015      PT End of Session - 01/15/15 1446    Visit Number 2   Number of Visits 12   Date for PT Re-Evaluation 02/23/15   PT Start Time 5732   PT Stop Time 1500   PT Time Calculation (min) 56 min   Activity Tolerance Patient tolerated treatment well;Patient limited by pain      Past Medical History  Diagnosis Date  . Asthma   . Diverticulitis   . Diverticulosis   . Thyroid disease     hypo  . Hypertension   . Diabetes mellitus     not on meds.  . Hypothyroidism   . Migraine   . Anemia     none recently  . Depression   . Wears glasses   . GERD (gastroesophageal reflux disease)   . Anxiety   . Complete tear of right rotator cuff 12/05/2014    Past Surgical History  Procedure Laterality Date  . Cesarean section    . Abdominal hysterectomy    . Knee cartilage surgery      Left  . Cataract extraction, bilateral    . Colonoscopy      multiple   . Esophagogastroduodenoscopy endoscopy      multiple  . Right foot surgery  little toe and next toe    x 3  . Laparoscopic partial colectomy N/A 04/22/2013    Procedure: LAPAROSCOPIC PARTIAL COLECTOMY;  Surgeon: Odis Hollingshead, MD;  Location: [facility];  Service: General;  Laterality: N/A;  . Shoulder arthroscopy with rotator cuff repair and subacromial decompression Right 12/05/2014    Procedure: RIGHT SHOULDER ARTHROSCOPY,ACROMOPLASTY, ROTATOR CUFF REPAIR;  Surgeon: [name], MD;  Location: [facility];  Service: Orthopedics;  Laterality: Right;    There were no vitals taken for this visit.  Visit Diagnosis:  Decreased range of motion (ROM) of shoulder  Weakness  generalized  Pain in joint, shoulder region, right      Subjective Assessment - 01/15/15 1409    Symptoms still hurting, thought it would be better now. bearable.   Currently in Pain? Yes   Pain Score 1    Pain Location Shoulder   Pain Orientation Right   Pain Descriptors / Indicators Nagging   Pain Type Surgical pain   Pain Onset More than a month ago   Pain Frequency Constant   Aggravating Factors  certain positions   Pain Relieving Factors certain resting movements   Effect of Pain on Daily Activities limits household chores                    [facility] Adult PT Treatment/Exercise - 01/15/15 0001    Shoulder Exercises: Supine   Other Supine Exercises cane   flexion, abd, ER/IR   Other Supine Exercises Isometrics all directions   Modalities   Modalities Cryotherapy  vaso   Cryotherapy   Number Minutes Cryotherapy 15 Minutes   Cryotherapy Location Shoulder  right   Manual Therapy   Manual Therapy Joint mobilization;Passive ROM  grd 3 & 4. [place] mods and PROM all directions.  PT Long Term Goals - 01/12/15 1224    PT LONG TERM GOAL #1   Title pt will improve Rt UE ROM to [facility] to improve functional mobility with home tasks   PT LONG TERM GOAL #2   Title Pt will improve Rt UE strength to 4/5 in order to perform housework without pain   PT LONG TERM GOAL #3   Title Pt will decrease Rt shoulder pain to 1/10 with grooming activities   PT LONG TERM GOAL #4   Title Pt will be able to use Rt UE to brush teeth without increased symptoms               Plan - 01/15/15 1448    Clinical Impression Statement pt did well today with HEP. She is I with all. Tolerated Jt mobs and PROM with minimal increase in pain   Pt will benefit from skilled therapeutic intervention in order to improve on the following deficits Decreased strength;Decreased range of motion;Pain;Impaired UE functional use   Rehab Potential Excellent   PT Frequency 2x /  week   PT Duration 6 weeks   PT Treatment/Interventions Moist Heat;Cryotherapy;Electrical Stimulation;Patient/family education;Therapeutic activities;Therapeutic exercise;Neuromuscular re-education;Manual techniques   PT Next Visit Plan per protocol   Consulted and Agree with Plan of Care Patient        Problem List Patient Active Problem List   Diagnosis Date Noted  . Complete tear of right rotator cuff 12/05/2014  . Wound healing, delayed 05/06/2013  . Candidal skin infection 05/06/2013  . H/O diverticulitis of SIGMOID and left colon s/p laparoscopic assisted left colectomy 04/22/13 02/15/2013  . Diverticulitis of proximal descending colon 02/07/2013  . Hypokalemia 01/26/2013  . Type 2 diabetes mellitus 08/13/2012  . Morbid obesity 08/13/2012  . Insomnia 08/13/2012  . History of ulcerative colitis 08/13/2012  . Musculoskeletal pain 08/13/2012  . Constipation 02/07/2012  . Hyperlipidemia 02/07/2012  . History of migraine headaches 02/07/2012  . Lichen planus 27/01/5008  . Allergic rhinitis 02/07/2012  . Angioedema 02/07/2012  . GERD 02/26/2010  . VITAMIN B12 DEFICIENCY 05/05/2008  . HYPOTHYROIDISM 04/29/2008  . DEPRESSION 04/29/2008  . HYPERTENSION 04/29/2008  . ASTHMA 04/29/2008  . DIVERTICULOSIS, COLON 12/11/2006  . H/O: hysterectomy 02/15/1998    Natividad Brood, PTA 01/15/2015, 2:57 PM  [facility] [place], [place], [address] Phone: [phone]   Fax:  [phone]

## 2015-01-19 ENCOUNTER — Ambulatory Visit (INDEPENDENT_AMBULATORY_CARE_PROVIDER_SITE_OTHER): Payer: 59 | Admitting: Physical Therapy

## 2015-01-19 DIAGNOSIS — M25619 Stiffness of unspecified shoulder, not elsewhere classified: Secondary | ICD-10-CM

## 2015-01-19 DIAGNOSIS — M7580 Other shoulder lesions, unspecified shoulder: Secondary | ICD-10-CM

## 2015-01-19 DIAGNOSIS — R531 Weakness: Secondary | ICD-10-CM

## 2015-01-19 DIAGNOSIS — M25511 Pain in right shoulder: Secondary | ICD-10-CM

## 2015-01-19 NOTE — Patient Instructions (Signed)
  Low Row: Single Arm   Face anchor in stride stance. Palm up, pull arm back while squeezing shoulder blades together. Repeat 10__ times per set. Repeat with other arm. Do _2-3_ sets per session. Do 3__ sessions per week. Anchor Height: Waist  [email or web address]   Copyright  VHI. All rights reserved.  Press: Thumb Up (Single Arm)   Face away from anchor in stride stance, leg forward opposite exercising arm. Press arm forward with thumb up. Repeat 10 times per set.  Do _2-3_ sets per session. Do _3_ sessions per week. Anchor Height: Chest  [email or web address]   Copyright  VHI. All rights reserved.  Rotation: External (Single Arm)   Side toward anchor in shoulder width stance with elbow bent to 90, arm across mid-section. Thumb up, pull arm away from body, keeping elbow bent. Repeat _10_ times per set. Repeat with other arm. Do _2-3_ sets per session. Do _3_ sessions per week. Anchor Height: Waist  [email or web address]   Copyright  VHI. All rights reserved.  Rotation: Internal (Single Arm)   Side toward anchor in shoulder width stance with elbow bent to 90, forearm away from body. Thumb up, pull arm across body keeping elbow bent. Repeat _10_ times per set. Repeat with other arm. Do _2-3_ sets per session. Do _3_ sessions per week. Anchor Height: Waist  [email or web address]   Copyright  VHI. All rights reserved.

## 2015-01-19 NOTE — Therapy (Signed)
[place], [place], [address] Phone: [phone]   Fax:  [phone]  Physical Therapy Treatment  Patient Details  Name: [name] MRN: [id number] Date of Birth: May 31, 1951 Referring Provider:  Marchia Bond, MD  Encounter Date: 01/19/2015      PT End of Session - 01/19/15 1405    Visit Number 3   Number of Visits 12   Date for PT Re-Evaluation 02/23/15   PT Start Time 2703   PT Stop Time 5009   PT Time Calculation (min) 45 min      Past Medical History  Diagnosis Date  . Asthma   . Diverticulitis   . Diverticulosis   . Thyroid disease     hypo  . Hypertension   . Diabetes mellitus     not on meds.  . Hypothyroidism   . Migraine   . Anemia     none recently  . Depression   . Wears glasses   . GERD (gastroesophageal reflux disease)   . Anxiety   . Complete tear of right rotator cuff 12/05/2014    Past Surgical History  Procedure Laterality Date  . Cesarean section    . Abdominal hysterectomy    . Knee cartilage surgery      Left  . Cataract extraction, bilateral    . Colonoscopy      multiple   . Esophagogastroduodenoscopy endoscopy      multiple  . Right foot surgery  little toe and next toe    x 3  . Laparoscopic partial colectomy N/A 04/22/2013    Procedure: LAPAROSCOPIC PARTIAL COLECTOMY;  Surgeon: Odis Hollingshead, MD;  Location: [facility];  Service: General;  Laterality: N/A;  . Shoulder arthroscopy with rotator cuff repair and subacromial decompression Right 12/05/2014    Procedure: RIGHT SHOULDER ARTHROSCOPY,ACROMOPLASTY, ROTATOR CUFF REPAIR;  Surgeon: [name], MD;  Location: [facility];  Service: Orthopedics;  Laterality: Right;    There were no vitals taken for this visit.  Visit Diagnosis:  Decreased range of motion (ROM) of shoulder  Weakness generalized  Pain in joint, shoulder region, right      Subjective Assessment -  01/19/15 1406    Symptoms Able to reach head for washing hair if seated; still difficult and painful.    Pain Score 3    Pain Location Shoulder   Pain Orientation Anterior   Pain Descriptors / Indicators Nagging;Aching   Pain Frequency Constant   Aggravating Factors  movement and trying to stretch arm    Pain Relieving Factors rest           [facility] PT Assessment - 01/19/15 0001    Assessment   Medical Diagnosis rt rotator cuff repair   Onset Date 12/05/14   Next MD Visit 02/05/15   PROM   PROM Assessment Site Shoulder   Right/Left Shoulder Right   Right Shoulder Flexion 113 Degrees  degrees, supine.  125 degrees after AAROM.    Right Shoulder ABduction 70 Degrees  degrees, supine   Right Shoulder Internal Rotation 62 Degrees  supine, shoulder ABD 50 deg   Right Shoulder External Rotation 53 Degrees  supine, shoulder ABD 50 deg                  [facility] Adult PT Treatment/Exercise - 01/19/15 0001    Shoulder Exercises: Supine   External Rotation AAROM;Right;5 reps  cane    Flexion AAROM;Right;10 reps  cane  Shoulder Exercises: Standing   External Rotation Strengthening;Right;10 reps;Theraband   Theraband Level (Shoulder External Rotation) Level 1 (Yellow)   Internal Rotation Strengthening;Right;10 reps;Theraband   Theraband Level (Shoulder Internal Rotation) Level 1 (Yellow)   Flexion Strengthening;Right;10 reps;Theraband  rockwood 4   Theraband Level (Shoulder Flexion) Level 1 (Yellow)   Row Strengthening;Right;10 reps   Theraband Level (Shoulder Row) Level 1 (Yellow)   Shoulder Exercises: Pulleys   Flexion 3 minutes   Modalities   Modalities Cryotherapy   Cryotherapy   Number Minutes Cryotherapy 15 Minutes   Cryotherapy Location Shoulder   Type of Cryotherapy --  vaso, 3 snowflakes, med pressure   Manual Therapy   Manual Therapy --                     PT Long Term Goals - 01/12/15 1224    PT LONG TERM GOAL #1   Title pt will improve  Rt UE ROM to [facility] to improve functional mobility with home tasks   PT LONG TERM GOAL #2   Title Pt will improve Rt UE strength to 4/5 in order to perform housework without pain   PT LONG TERM GOAL #3   Title Pt will decrease Rt shoulder pain to 1/10 with grooming activities   PT LONG TERM GOAL #4   Title Pt will be able to use Rt UE to brush teeth without increased symptoms               Plan - 01/19/15 1545    Clinical Impression Statement Pt demonstrated increased Rt shoulder ROM with AAROM. Pt tolerated new exercises with minimal increase in symptoms.  Pt progressing towards goals. No goals met yet secondary to number of visit.    Pt will benefit from skilled therapeutic intervention in order to improve on the following deficits Decreased strength;Decreased range of motion;Pain;Impaired UE functional use   Rehab Potential Excellent   PT Frequency 2x / week   PT Duration 6 weeks   PT Treatment/Interventions Moist Heat;Cryotherapy;Electrical Stimulation;Patient/family education;Therapeutic activities;Therapeutic exercise;Neuromuscular re-education;Manual techniques   PT Next Visit Plan per protocol   Consulted and Agree with Plan of Care Patient        Problem List Patient Active Problem List   Diagnosis Date Noted  . Complete tear of right rotator cuff 12/05/2014  . Wound healing, delayed 05/06/2013  . Candidal skin infection 05/06/2013  . H/O diverticulitis of SIGMOID and left colon s/p laparoscopic assisted left colectomy 04/22/13 02/15/2013  . Diverticulitis of proximal descending colon 02/07/2013  . Hypokalemia 01/26/2013  . Type 2 diabetes mellitus 08/13/2012  . Morbid obesity 08/13/2012  . Insomnia 08/13/2012  . History of ulcerative colitis 08/13/2012  . Musculoskeletal pain 08/13/2012  . Constipation 02/07/2012  . Hyperlipidemia 02/07/2012  . History of migraine headaches 02/07/2012  . Lichen planus 51/88/4166  . Allergic rhinitis 02/07/2012  . Angioedema  02/07/2012  . GERD 02/26/2010  . VITAMIN B12 DEFICIENCY 05/05/2008  . HYPOTHYROIDISM 04/29/2008  . DEPRESSION 04/29/2008  . HYPERTENSION 04/29/2008  . ASTHMA 04/29/2008  . DIVERTICULOSIS, COLON 12/11/2006  . H/O: hysterectomy 02/15/1998   Kerin Perna, PTA 01/19/2015 3:48 PM   [facility] [place], [place], [address] Phone: [phone]   Fax:  [phone]

## 2015-01-22 ENCOUNTER — Ambulatory Visit (INDEPENDENT_AMBULATORY_CARE_PROVIDER_SITE_OTHER): Payer: 59 | Admitting: Physical Therapy

## 2015-01-22 DIAGNOSIS — M25619 Stiffness of unspecified shoulder, not elsewhere classified: Secondary | ICD-10-CM

## 2015-01-22 DIAGNOSIS — R531 Weakness: Secondary | ICD-10-CM

## 2015-01-22 DIAGNOSIS — M25511 Pain in right shoulder: Secondary | ICD-10-CM

## 2015-01-22 DIAGNOSIS — M7580 Other shoulder lesions, unspecified shoulder: Secondary | ICD-10-CM

## 2015-01-22 NOTE — Therapy (Signed)
[place], [place], [address] Phone: [phone]   Fax:  [phone]  Physical Therapy Treatment  Patient Details  Name: [name] MRN: [id number] Date of Birth: 01/18/1951 Referring Provider:  Marchia Bond, MD  Encounter Date: 01/22/2015      PT End of Session - 01/22/15 1519    Visit Number 4   Number of Visits 12   Date for PT Re-Evaluation 02/23/15   PT Start Time 2637   PT Stop Time 1530   PT Time Calculation (min) 45 min   Activity Tolerance Patient tolerated treatment well;Patient limited by pain   Behavior During Therapy [facility] for tasks assessed/performed      Past Medical History  Diagnosis Date  . Asthma   . Diverticulitis   . Diverticulosis   . Thyroid disease     hypo  . Hypertension   . Diabetes mellitus     not on meds.  . Hypothyroidism   . Migraine   . Anemia     none recently  . Depression   . Wears glasses   . GERD (gastroesophageal reflux disease)   . Anxiety   . Complete tear of right rotator cuff 12/05/2014    Past Surgical History  Procedure Laterality Date  . Cesarean section    . Abdominal hysterectomy    . Knee cartilage surgery      Left  . Cataract extraction, bilateral    . Colonoscopy      multiple   . Esophagogastroduodenoscopy endoscopy      multiple  . Right foot surgery  little toe and next toe    x 3  . Laparoscopic partial colectomy N/A 04/22/2013    Procedure: LAPAROSCOPIC PARTIAL COLECTOMY;  Surgeon: Odis Hollingshead, MD;  Location: [facility];  Service: General;  Laterality: N/A;  . Shoulder arthroscopy with rotator cuff repair and subacromial decompression Right 12/05/2014    Procedure: RIGHT SHOULDER ARTHROSCOPY,ACROMOPLASTY, ROTATOR CUFF REPAIR;  Surgeon: [name], MD;  Location: [facility];  Service: Orthopedics;  Laterality: Right;    There were no vitals filed for this visit.  Visit Diagnosis:   Decreased range of motion (ROM) of shoulder  Weakness generalized  Pain in joint, shoulder region, right      Subjective Assessment - 01/22/15 1449    Symptoms R shoulder a little sore today; pain comes and goes   Patient Stated Goals be able to do housework, dressing, etc with Rt UE   Currently in Pain? Yes   Pain Score 4    Pain Location Shoulder   Pain Orientation Anterior   Pain Descriptors / Indicators Aching;Nagging                       [facility] Adult PT Treatment/Exercise - 01/22/15 1450    Shoulder Exercises: Supine   External Rotation AAROM;Right;15 reps   Flexion AAROM;Right;15 reps;Other (comment)  cane   Shoulder Exercises: Standing   External Rotation Strengthening;Right;15 reps;Theraband   Theraband Level (Shoulder External Rotation) Level 1 (Yellow)   Internal Rotation Strengthening;Right;15 reps   Theraband Level (Shoulder Internal Rotation) Level 1 (Yellow)   Flexion Strengthening;Right;15 reps;Theraband   Theraband Level (Shoulder Flexion) Level 1 (Yellow)   Flexion Limitations AAROM with wall ladder x 15   Row Strengthening;Right;15 reps;Theraband   Theraband Level (Shoulder Row) Level 1 (Yellow)   Shoulder Exercises: Pulleys   Flexion 3 minutes   Modalities  Modalities Cryotherapy   Cryotherapy   Number Minutes Cryotherapy 15 Minutes   Cryotherapy Location Shoulder   Type of Cryotherapy Other (comment)  vasopneumatic min pressure                     PT Long Term Goals - 01/22/15 1520    PT LONG TERM GOAL #1   Title pt will improve Rt UE ROM to [facility] to improve functional mobility with home tasks   Status On-going   PT LONG TERM GOAL #2   Title Pt will improve Rt UE strength to 4/5 in order to perform housework without pain   Status On-going   PT LONG TERM GOAL #3   Title Pt will decrease Rt shoulder pain to 1/10 with grooming activities   Status On-going   PT LONG TERM GOAL #4   Title Pt will be able to use Rt UE to  brush teeth without increased symptoms   Status On-going               Plan - 01/22/15 1519    Clinical Impression Statement Increased soreness today and increased pain with exercises during session.  Pt tolerated well overall despite increase in pain   PT Next Visit Plan per protocol   Consulted and Agree with Plan of Care Patient        Problem List Patient Active Problem List   Diagnosis Date Noted  . Complete tear of right rotator cuff 12/05/2014  . Wound healing, delayed 05/06/2013  . Candidal skin infection 05/06/2013  . H/O diverticulitis of SIGMOID and left colon s/p laparoscopic assisted left colectomy 04/22/13 02/15/2013  . Diverticulitis of proximal descending colon 02/07/2013  . Hypokalemia 01/26/2013  . Type 2 diabetes mellitus 08/13/2012  . Morbid obesity 08/13/2012  . Insomnia 08/13/2012  . History of ulcerative colitis 08/13/2012  . Musculoskeletal pain 08/13/2012  . Constipation 02/07/2012  . Hyperlipidemia 02/07/2012  . History of migraine headaches 02/07/2012  . Lichen planus 41/66/0630  . Allergic rhinitis 02/07/2012  . Angioedema 02/07/2012  . GERD 02/26/2010  . VITAMIN B12 DEFICIENCY 05/05/2008  . HYPOTHYROIDISM 04/29/2008  . DEPRESSION 04/29/2008  . HYPERTENSION 04/29/2008  . ASTHMA 04/29/2008  . DIVERTICULOSIS, COLON 12/11/2006  . H/O: hysterectomy 02/15/1998   Laureen Abrahams, PT, DPT 01/22/2015 3:31 PM  [facility] [place], [place], [address] Phone: [phone]   Fax:  [phone]

## 2015-01-26 ENCOUNTER — Ambulatory Visit (INDEPENDENT_AMBULATORY_CARE_PROVIDER_SITE_OTHER): Payer: 59 | Admitting: Physical Therapy

## 2015-01-26 DIAGNOSIS — M25511 Pain in right shoulder: Secondary | ICD-10-CM

## 2015-01-26 DIAGNOSIS — R531 Weakness: Secondary | ICD-10-CM

## 2015-01-26 DIAGNOSIS — M25619 Stiffness of unspecified shoulder, not elsewhere classified: Secondary | ICD-10-CM

## 2015-01-26 DIAGNOSIS — M7580 Other shoulder lesions, unspecified shoulder: Secondary | ICD-10-CM

## 2015-01-26 NOTE — Therapy (Signed)
[place], [place], [address] Phone: [phone]   Fax:  [phone]  Physical Therapy Treatment  Patient Details  Name: [name] MRN: [id number] Date of Birth: 1951/02/21 Referring Provider:  Marchia Bond, MD  Encounter Date: 01/26/2015      PT End of Session - 01/26/15 1517    Visit Number 5   Number of Visits 12   Date for PT Re-Evaluation 02/23/15   PT Start Time 4742   PT Stop Time 5956   PT Time Calculation (min) 58 min   Activity Tolerance Patient limited by pain;Patient tolerated treatment well      Past Medical History  Diagnosis Date  . Asthma   . Diverticulitis   . Diverticulosis   . Thyroid disease     hypo  . Hypertension   . Diabetes mellitus     not on meds.  . Hypothyroidism   . Migraine   . Anemia     none recently  . Depression   . Wears glasses   . GERD (gastroesophageal reflux disease)   . Anxiety   . Complete tear of right rotator cuff 12/05/2014    Past Surgical History  Procedure Laterality Date  . Cesarean section    . Abdominal hysterectomy    . Knee cartilage surgery      Left  . Cataract extraction, bilateral    . Colonoscopy      multiple   . Esophagogastroduodenoscopy endoscopy      multiple  . Right foot surgery  little toe and next toe    x 3  . Laparoscopic partial colectomy N/A 04/22/2013    Procedure: LAPAROSCOPIC PARTIAL COLECTOMY;  Surgeon: Odis Hollingshead, MD;  Location: [facility];  Service: General;  Laterality: N/A;  . Shoulder arthroscopy with rotator cuff repair and subacromial decompression Right 12/05/2014    Procedure: RIGHT SHOULDER ARTHROSCOPY,ACROMOPLASTY, ROTATOR CUFF REPAIR;  Surgeon: [name], MD;  Location: [facility];  Service: Orthopedics;  Laterality: Right;    There were no vitals filed for this visit.  Visit Diagnosis:  Decreased range of motion (ROM) of shoulder  Weakness  generalized  Pain in joint, shoulder region, right      Subjective Assessment - 01/26/15 1517    Symptoms Rt shoulder still very sore.  Nothing new to report. Pt voiced frustration with slow progress.    Currently in Pain? Yes   Pain Score 4    Pain Location Shoulder   Pain Orientation Right   Pain Descriptors / Indicators Aching;Nagging   Pain Radiating Towards Rt elbow, tingling in fifth finger    Aggravating Factors  lifting arm    Pain Relieving Factors positioned on pillow in chair relax, ice             [facility] PT Assessment - 01/26/15 0001    Assessment   Medical Diagnosis rt rotator cuff repair   Onset Date 12/05/14   Next MD Visit 02/05/15   ROM / Strength   AROM / PROM / Strength AROM   AROM   AROM Assessment Site Shoulder   Right/Left Shoulder Right   Right Shoulder Flexion 80 Degrees  seated   Right Shoulder ABduction 57 Degrees  seated   PROM   PROM Assessment Site Shoulder   Right/Left Shoulder Right   Right Shoulder Flexion 132 Degrees  supine   Right Shoulder ABduction 85 Degrees   Right Shoulder Internal Rotation 67 Degrees  Right Shoulder External Rotation 65 Degrees   Strength   Strength Assessment Site --   Right/Left Shoulder --                   [facility] Adult PT Treatment/Exercise - 01/26/15 0001    Shoulder Exercises: Sidelying   External Rotation Strengthening;Right;5 reps   External Rotation Weight (lbs) 1  stopped sidelying ER withwt, due to increased pain to 7/10.    ABduction Strengthening;Right;10 reps   ABduction Weight (lbs) 1  very challenging   Shoulder Exercises: Standing   External Rotation Strengthening;Right;12 reps   Theraband Level (Shoulder External Rotation) Level 2 (Red)   Internal Rotation Strengthening;Right;12 reps   Theraband Level (Shoulder Internal Rotation) Level 2 (Red)   Flexion Strengthening;Right;12 reps   Theraband Level (Shoulder Flexion) Level 1 (Yellow)   Row Strengthening;Right;12  reps;Theraband   Theraband Level (Shoulder Row) Level 2 (Red)   Shoulder Exercises: Pulleys   Flexion 2 minutes   ABduction 2 minutes   Shoulder Exercises: ROM/Strengthening   UBE (Upper Arm Bike) level 1, 2 min each way   Shoulder Exercises: Stretch   Table Stretch - Abduction 5 reps;10 seconds   Table Stretch - External Rotation 5 reps;10 seconds   Modalities   Modalities Cryotherapy   Cryotherapy   Number Minutes Cryotherapy 15 Minutes   Cryotherapy Location Shoulder   Type of Cryotherapy --  vaso, med pressure, 3 snowflakes.               PT Long Term Goals - 01/26/15 1558    PT LONG TERM GOAL #1   Title pt will improve Rt UE ROM to [facility] to improve functional mobility with home tasks   Status On-going   PT LONG TERM GOAL #2   Title Pt will improve Rt UE strength to 4/5 in order to perform housework without pain   Status On-going   PT LONG TERM GOAL #3   Title Pt will decrease Rt shoulder pain to 1/10 with grooming activities   Status On-going   PT LONG TERM GOAL #4   Title Pt will be able to use Rt UE to brush teeth without increased symptoms   Status Achieved               Plan - 01/26/15 1600    Clinical Impression Statement Pt demonstrated improved Rt shoulder ROM this visit. Pt has met LTG #4.  Pt tolerated increased resistance with therapeutic exercise.    Pt will benefit from skilled therapeutic intervention in order to improve on the following deficits Decreased strength;Decreased range of motion;Pain;Impaired UE functional use   Rehab Potential Excellent   PT Frequency 2x / week   PT Duration 6 weeks   PT Treatment/Interventions Moist Heat;Cryotherapy;Electrical Stimulation;Patient/family education;Therapeutic activities;Therapeutic exercise;Neuromuscular re-education;Manual techniques   PT Next Visit Plan per protocol   Consulted and Agree with Plan of Care Patient        Problem List Patient Active Problem List   Diagnosis Date Noted  .  Complete tear of right rotator cuff 12/05/2014  . Wound healing, delayed 05/06/2013  . Candidal skin infection 05/06/2013  . H/O diverticulitis of SIGMOID and left colon s/p laparoscopic assisted left colectomy 04/22/13 02/15/2013  . Diverticulitis of proximal descending colon 02/07/2013  . Hypokalemia 01/26/2013  . Type 2 diabetes mellitus 08/13/2012  . Morbid obesity 08/13/2012  . Insomnia 08/13/2012  . History of ulcerative colitis 08/13/2012  . Musculoskeletal pain 08/13/2012  . Constipation 02/07/2012  .  Hyperlipidemia 02/07/2012  . History of migraine headaches 02/07/2012  . Lichen planus 21/78/3754  . Allergic rhinitis 02/07/2012  . Angioedema 02/07/2012  . GERD 02/26/2010  . VITAMIN B12 DEFICIENCY 05/05/2008  . HYPOTHYROIDISM 04/29/2008  . DEPRESSION 04/29/2008  . HYPERTENSION 04/29/2008  . ASTHMA 04/29/2008  . DIVERTICULOSIS, COLON 12/11/2006  . H/O: hysterectomy 02/15/1998   Kerin Perna, PTA 01/26/2015 4:45 PM  [facility] [place], [place], [address] Phone: [phone]   Fax:  [phone]

## 2015-01-29 ENCOUNTER — Ambulatory Visit (INDEPENDENT_AMBULATORY_CARE_PROVIDER_SITE_OTHER): Payer: 59 | Admitting: Physical Therapy

## 2015-01-29 DIAGNOSIS — M25511 Pain in right shoulder: Secondary | ICD-10-CM

## 2015-01-29 DIAGNOSIS — R531 Weakness: Secondary | ICD-10-CM

## 2015-01-29 DIAGNOSIS — M25619 Stiffness of unspecified shoulder, not elsewhere classified: Secondary | ICD-10-CM

## 2015-01-29 DIAGNOSIS — M7580 Other shoulder lesions, unspecified shoulder: Secondary | ICD-10-CM

## 2015-01-29 NOTE — Therapy (Signed)
[facility] [place], [place], [address] Phone: [phone]   Fax:  [phone]  Physical Therapy Treatment  Patient Details  Name: [name] MRN: [id number] Date of Birth: 03/10/1951 Referring Provider:  Marchia Bond, MD  Encounter Date: 01/29/2015      PT End of Session - 01/29/15 1527    Visit Number 6   Number of Visits 12   Date for PT Re-Evaluation 02/23/15   PT Start Time 4174   PT Stop Time 1538   PT Time Calculation (min) 53 min   Activity Tolerance Patient limited by pain      Past Medical History  Diagnosis Date  . Asthma   . Diverticulitis   . Diverticulosis   . Thyroid disease     hypo  . Hypertension   . Diabetes mellitus     not on meds.  . Hypothyroidism   . Migraine   . Anemia     none recently  . Depression   . Wears glasses   . GERD (gastroesophageal reflux disease)   . Anxiety   . Complete tear of right rotator cuff 12/05/2014    Past Surgical History  Procedure Laterality Date  . Cesarean section    . Abdominal hysterectomy    . Knee cartilage surgery      Left  . Cataract extraction, bilateral    . Colonoscopy      multiple   . Esophagogastroduodenoscopy endoscopy      multiple  . Right foot surgery  little toe and next toe    x 3  . Laparoscopic partial colectomy N/A 04/22/2013    Procedure: LAPAROSCOPIC PARTIAL COLECTOMY;  Surgeon: Odis Hollingshead, MD;  Location: [facility];  Service: General;  Laterality: N/A;  . Shoulder arthroscopy with rotator cuff repair and subacromial decompression Right 12/05/2014    Procedure: RIGHT SHOULDER ARTHROSCOPY,ACROMOPLASTY, ROTATOR CUFF REPAIR;  Surgeon: [name], MD;  Location: [facility];  Service: Orthopedics;  Laterality: Right;    There were no vitals filed for this visit.  Visit Diagnosis:  Decreased range of motion (ROM) of shoulder  Weakness generalized  Pain in joint, shoulder  region, right      Subjective Assessment - 01/29/15 1449    Symptoms Pt reports she's "been lazy" and hasn't done much with Rt shoulder. Pt reported feels like something is catching in shoulder with reaching motions.  Pt voices frustration with slow progress.    Patient Stated Goals be able to do housework, dressing, etc with Rt UE   Pain Score 2   reported took pain medicine prior to tx.    Pain Location Shoulder   Pain Orientation Right   Pain Descriptors / Indicators Nagging            [facility] PT Assessment - 01/29/15 0001    Assessment   Medical Diagnosis rt rotator cuff repair   Onset Date 12/05/14   Next MD Visit 02/05/15                   [facility] Adult PT Treatment/Exercise - 01/29/15 0001    Exercises   Exercises Shoulder   Shoulder Exercises: Supine   Flexion AROM;Right  difficult. to 90 deg. 1# too heavy. Performed without weight 8 x's. (tremulous)    Shoulder Exercises: Prone   Extension Right;12 reps x 2 sets  tactile cues for scap retraction   Horizontal ABduction 1 Right;10 reps x 2 sets  T's palm down, thumb down 10 each.    Shoulder Exercises: Standing   External Rotation Strengthening;Right;Theraband;20 reps   Theraband Level (Shoulder External Rotation) Level 2 (Red)   Internal Rotation Strengthening;Right;20 reps   Theraband Level (Shoulder Internal Rotation) Level 2 (Red)   Flexion Strengthening;Right;10 reps;Theraband   Theraband Level (Shoulder Flexion) Level 2 (Red)   Row Right;Strengthening;20 reps   Theraband Level (Shoulder Row) Level 2 (Red)   Other Standing Exercises wall ladder ABD x 4 reps (up to #19)   Shoulder Exercises: Pulleys   Flexion 2 minutes   ABduction 2 minutes   Other Pulley Exercises IR x 2 min    Shoulder Exercises: ROM/Strengthening   UBE (Upper Arm Bike) level 1, 2 min each way   Modalities   Modalities Cryotherapy   Cryotherapy   Number Minutes Cryotherapy 15 Minutes   Cryotherapy Location Shoulder   Type of  Cryotherapy --  vaso, med pressure,                 PT Education - 01/29/15 1526    Education provided Yes   Education Details self care- instructed pt to trial ice massage at home over weekend to point tender spot in shoulder.    Person(s) Educated Patient   Methods Explanation   Comprehension Verbalized understanding             PT Long Term Goals - 01/26/15 1558    PT LONG TERM GOAL #1   Title pt will improve Rt UE ROM to [facility] to improve functional mobility with home tasks   Status On-going   PT LONG TERM GOAL #2   Title Pt will improve Rt UE strength to 4/5 in order to perform housework without pain   Status On-going   PT LONG TERM GOAL #3   Title Pt will decrease Rt shoulder pain to 1/10 with grooming activities   Status On-going   PT LONG TERM GOAL #4   Title Pt will be able to use Rt UE to brush teeth without increased symptoms   Status Achieved               Plan - 01/29/15 1751    Clinical Impression Statement Pt tolerated most of the exercises in today's session. Pt continues to experience increased pain in Rt shoulder with flexion with resistance and against gravity.  Progressing towards goals.    Pt will benefit from skilled therapeutic intervention in order to improve on the following deficits Decreased strength;Decreased range of motion;Pain;Impaired UE functional use   Rehab Potential Excellent   PT Frequency 2x / week   PT Duration 6 weeks   PT Treatment/Interventions Moist Heat;Cryotherapy;Electrical Stimulation;Patient/family education;Therapeutic activities;Therapeutic exercise;Neuromuscular re-education;Manual techniques   PT Next Visit Plan Continue per plan of care.    Consulted and Agree with Plan of Care Patient        Problem List Patient Active Problem List   Diagnosis Date Noted  . Complete tear of right rotator cuff 12/05/2014  . Wound healing, delayed 05/06/2013  . Candidal skin infection 05/06/2013  . H/O  diverticulitis of SIGMOID and left colon s/p laparoscopic assisted left colectomy 04/22/13 02/15/2013  . Diverticulitis of proximal descending colon 02/07/2013  . Hypokalemia 01/26/2013  . Type 2 diabetes mellitus 08/13/2012  . Morbid obesity 08/13/2012  . Insomnia 08/13/2012  . History of ulcerative colitis 08/13/2012  . Musculoskeletal pain 08/13/2012  . Constipation 02/07/2012  . Hyperlipidemia 02/07/2012  . History of migraine headaches 02/07/2012  .  Lichen planus 49/82/6415  . Allergic rhinitis 02/07/2012  . Angioedema 02/07/2012  . GERD 02/26/2010  . VITAMIN B12 DEFICIENCY 05/05/2008  . HYPOTHYROIDISM 04/29/2008  . DEPRESSION 04/29/2008  . HYPERTENSION 04/29/2008  . ASTHMA 04/29/2008  . DIVERTICULOSIS, COLON 12/11/2006  . H/O: hysterectomy 02/15/1998   Kerin Perna, PTA 01/29/2015 6:01 PM  [facility] [place], [place], [address] Phone: [phone]   Fax:  [phone]

## 2015-02-03 ENCOUNTER — Ambulatory Visit (INDEPENDENT_AMBULATORY_CARE_PROVIDER_SITE_OTHER): Payer: 59 | Admitting: Physical Therapy

## 2015-02-03 DIAGNOSIS — M25511 Pain in right shoulder: Secondary | ICD-10-CM | POA: Diagnosis not present

## 2015-02-03 DIAGNOSIS — M7580 Other shoulder lesions, unspecified shoulder: Secondary | ICD-10-CM

## 2015-02-03 DIAGNOSIS — R531 Weakness: Secondary | ICD-10-CM

## 2015-02-03 DIAGNOSIS — M25619 Stiffness of unspecified shoulder, not elsewhere classified: Secondary | ICD-10-CM

## 2015-02-03 NOTE — Therapy (Signed)
[place], [place], [address] Phone: [phone]   Fax:  [phone]  Physical Therapy Treatment  Patient Details  Name: [name] MRN: [id number] Date of Birth: September 23, 1951 Referring Provider:  Marchia Bond, MD  Encounter Date: 02/03/2015      PT End of Session - 02/03/15 1218    Visit Number 7   Number of Visits 12   Date for PT Re-Evaluation 02/23/15   PT Start Time 1147   PT Stop Time 1246   PT Time Calculation (min) 59 min   Activity Tolerance Patient limited by pain      Past Medical History  Diagnosis Date  . Asthma   . Diverticulitis   . Diverticulosis   . Thyroid disease     hypo  . Hypertension   . Diabetes mellitus     not on meds.  . Hypothyroidism   . Migraine   . Anemia     none recently  . Depression   . Wears glasses   . GERD (gastroesophageal reflux disease)   . Anxiety   . Complete tear of right rotator cuff 12/05/2014    Past Surgical History  Procedure Laterality Date  . Cesarean section    . Abdominal hysterectomy    . Knee cartilage surgery      Left  . Cataract extraction, bilateral    . Colonoscopy      multiple   . Esophagogastroduodenoscopy endoscopy      multiple  . Right foot surgery  little toe and next toe    x 3  . Laparoscopic partial colectomy N/A 04/22/2013    Procedure: LAPAROSCOPIC PARTIAL COLECTOMY;  Surgeon: Odis Hollingshead, MD;  Location: [facility];  Service: General;  Laterality: N/A;  . Shoulder arthroscopy with rotator cuff repair and subacromial decompression Right 12/05/2014    Procedure: RIGHT SHOULDER ARTHROSCOPY,ACROMOPLASTY, ROTATOR CUFF REPAIR;  Surgeon: [name], MD;  Location: [facility];  Service: Orthopedics;  Laterality: Right;    There were no vitals filed for this visit.  Visit Diagnosis:  Decreased range of motion (ROM) of shoulder  Weakness generalized  Pain in joint, shoulder  region, right      Subjective Assessment - 02/03/15 1155    Symptoms "If I had known it was going to be this hard, I might have not had the surgery".  Pt reports she's trying to be more positive regarding therapy.    Pain Score 3    Pain Location Shoulder   Pain Orientation Right   Pain Descriptors / Indicators Constant;Aching;Dull            [facility] PT Assessment - 02/03/15 0001    Assessment   Medical Diagnosis rt rotator cuff repair   Onset Date 12/05/14   Next MD Visit 02/05/15   AROM   AROM Assessment Site Shoulder   Right Shoulder Flexion 110 Degrees  seated    Right Shoulder ABduction 85 Degrees  seated   PROM   PROM Assessment Site Shoulder  In supine    Right/Left Shoulder Right   Right Shoulder Flexion 140 Degrees   Right Shoulder ABduction 90 Degrees  very guarded (supine)    Right Shoulder Internal Rotation 65 Degrees   Right Shoulder External Rotation 72 Degrees                   [facility] Adult PT Treatment/Exercise - 02/03/15 0001    Exercises  Exercises Shoulder   Shoulder Exercises: Supine   Protraction Right;Strengthening;10 reps;Weights   Protraction Weight (lbs) 1#   External Rotation 10 reps;Both;Strengthening;Theraband   Theraband Level (Shoulder External Rotation) Level 2 (Red)  VC for ER, not elbow flexion   Flexion Strengthening;Right;5 reps  1# very difficult!, finished 2 reps no wt.(just to 90 deg)    Shoulder Exercises: Prone   Extension 10 reps;Strengthening;Right  2sets.    Horizontal ABduction 1 Right;Strengthening;10 reps  required tactile cues - 2 sets    Shoulder Exercises: Standing   Flexion Strengthening;Right;15 reps;Weights  to 80 deg, without pain.    Shoulder Flexion Weight (lbs) 1#   Row Strengthening;Both;12 reps   Theraband Level (Shoulder Row) Level 2 (Red)   Shoulder Exercises: Pulleys   Flexion 2 minutes   ABduction 2 minutes   Other Pulley Exercises IR with towel x 1 min, gentle.    Shoulder Exercises:  ROM/Strengthening   UBE (Upper Arm Bike) Level 3, 2 min each direction   Wall Pushups 10 reps   Modalities   Modalities Cryotherapy   Cryotherapy   Number Minutes Cryotherapy 15 Minutes   Cryotherapy Location Shoulder   Type of Cryotherapy --  vaso, med pressure, 3 snowflakes.                      PT Long Term Goals - 01/26/15 1558    PT LONG TERM GOAL #1   Title pt will improve Rt UE ROM to [facility] to improve functional mobility with home tasks   Status On-going   PT LONG TERM GOAL #2   Title Pt will improve Rt UE strength to 4/5 in order to perform housework without pain   Status On-going   PT LONG TERM GOAL #3   Title Pt will decrease Rt shoulder pain to 1/10 with grooming activities   Status On-going   PT LONG TERM GOAL #4   Title Pt will be able to use Rt UE to brush teeth without increased symptoms   Status Achieved               Plan - 02/03/15 1252    Clinical Impression Statement Pt is progressing well making good gains in ROM and strength each visit. Pt demo improved Rt shoulder ROM from last visit.  Pt continues to report pain with shoulder flexion; requested pt return to isometrics and AAROM for this motion.    Pt will benefit from skilled therapeutic intervention in order to improve on the following deficits Decreased strength;Decreased range of motion;Pain;Impaired UE functional use   Rehab Potential Excellent   PT Frequency 2x / week   PT Duration 6 weeks   PT Treatment/Interventions Moist Heat;Cryotherapy;Electrical Stimulation;Patient/family education;Therapeutic activities;Therapeutic exercise;Neuromuscular re-education;Manual techniques   PT Next Visit Plan Continue per plan of care.    Consulted and Agree with Plan of Care Patient        Problem List Patient Active Problem List   Diagnosis Date Noted  . Complete tear of right rotator cuff 12/05/2014  . Wound healing, delayed 05/06/2013  . Candidal skin infection 05/06/2013  . H/O  diverticulitis of SIGMOID and left colon s/p laparoscopic assisted left colectomy 04/22/13 02/15/2013  . Diverticulitis of proximal descending colon 02/07/2013  . Hypokalemia 01/26/2013  . Type 2 diabetes mellitus 08/13/2012  . Morbid obesity 08/13/2012  . Insomnia 08/13/2012  . History of ulcerative colitis 08/13/2012  . Musculoskeletal pain 08/13/2012  . Constipation 02/07/2012  . Hyperlipidemia 02/07/2012  .  History of migraine headaches 02/07/2012  . Lichen planus 00/34/9611  . Allergic rhinitis 02/07/2012  . Angioedema 02/07/2012  . GERD 02/26/2010  . VITAMIN B12 DEFICIENCY 05/05/2008  . HYPOTHYROIDISM 04/29/2008  . DEPRESSION 04/29/2008  . HYPERTENSION 04/29/2008  . ASTHMA 04/29/2008  . DIVERTICULOSIS, COLON 12/11/2006  . H/O: hysterectomy 02/15/1998    Kerin Perna, PTA 02/03/2015 12:55 PM  [facility] [place], [place], [address] Phone: [phone]   Fax:  [phone]

## 2015-02-05 ENCOUNTER — Encounter: Payer: 59 | Admitting: Physical Therapy

## 2015-02-11 ENCOUNTER — Ambulatory Visit (INDEPENDENT_AMBULATORY_CARE_PROVIDER_SITE_OTHER): Payer: 59 | Admitting: Physical Therapy

## 2015-02-11 DIAGNOSIS — R531 Weakness: Secondary | ICD-10-CM

## 2015-02-11 DIAGNOSIS — M25619 Stiffness of unspecified shoulder, not elsewhere classified: Secondary | ICD-10-CM

## 2015-02-11 DIAGNOSIS — M7580 Other shoulder lesions, unspecified shoulder: Secondary | ICD-10-CM

## 2015-02-11 DIAGNOSIS — M25511 Pain in right shoulder: Secondary | ICD-10-CM

## 2015-02-11 NOTE — Therapy (Signed)
[place]  [place], [place], [address] Phone: [phone]   Fax:  [phone]  Physical Therapy Treatment  Patient Details  Name: [name] MRN: [id number] Date of Birth: [birth year] Referring Provider:  Marchia Bond, MD  Encounter Date: 02/11/2015      PT End of Session - 02/11/15 1601    Visit Number 8   Number of Visits 12   Date for PT Re-Evaluation 02/23/15   PT Start Time 1025   PT Stop Time 1613   PT Time Calculation (min) 58 min   Activity Tolerance Patient tolerated treatment well   Behavior During Therapy [facility] for tasks assessed/performed      Past Medical History  Diagnosis Date  . Asthma   . Diverticulitis   . Diverticulosis   . Thyroid disease     hypo  . Hypertension   . Diabetes mellitus     not on meds.  . Hypothyroidism   . Migraine   . Anemia     none recently  . Depression   . Wears glasses   . GERD (gastroesophageal reflux disease)   . Anxiety   . Complete tear of right rotator cuff 12/05/2014    Past Surgical History  Procedure Laterality Date  . Cesarean section    . Abdominal hysterectomy    . Knee cartilage surgery      Left  . Cataract extraction, bilateral    . Colonoscopy      multiple   . Esophagogastroduodenoscopy endoscopy      multiple  . Right foot surgery  little toe and next toe    x 3  . Laparoscopic partial colectomy N/A 04/22/2013    Procedure: LAPAROSCOPIC PARTIAL COLECTOMY;  Surgeon: Odis Hollingshead, MD;  Location: [facility];  Service: General;  Laterality: N/A;  . Shoulder arthroscopy with rotator cuff repair and subacromial decompression Right 12/05/2014    Procedure: RIGHT SHOULDER ARTHROSCOPY,ACROMOPLASTY, ROTATOR CUFF REPAIR;  Surgeon: [name], MD;  Location: [facility];  Service: Orthopedics;  Laterality: Right;    There were no vitals filed for this visit.  Visit Diagnosis:  Decreased range of motion (ROM)  of shoulder  Weakness generalized  Pain in joint, shoulder region, right      Subjective Assessment - 02/11/15 1516    Symptoms Feeling better; feels stronger   Patient Stated Goals be able to do housework, dressing, etc with Rt UE   Currently in Pain? Yes   Pain Score 2    Pain Location Shoulder   Pain Orientation Right   Pain Descriptors / Indicators Aching;Constant                       [facility] Adult PT Treatment/Exercise - 02/11/15 1518    Shoulder Exercises: Supine   Protraction Strengthening;Right;20 reps;Weights   Protraction Weight (lbs) 2#   Protraction Limitations serratus punches and chest press   Flexion Strengthening;Right;20 reps;Weights   Shoulder Flexion Weight (lbs) 1# x 10; 2# x 10   Shoulder Exercises: Prone   Extension Strengthening;Right;20 reps;Weights   Extension Weight (lbs) 1# x 10; 2# x 10   Horizontal ABduction 1 Strengthening;Right;20 reps;Weights   Horizontal ABduction 1 Weight (lbs) 1#   Shoulder Exercises: Sidelying   External Rotation Strengthening;Right;20 reps;Weights   External Rotation Weight (lbs) 1# x 10; 2# x 10   ABduction Strengthening;Right;20 reps;Weights   ABduction Weight (lbs) 1#   Shoulder Exercises: Standing  Flexion Right;20 reps;Weights   Shoulder Flexion Weight (lbs) 1# x 10; 2# x 10   Flexion Limitations simulated counter/cabinet activities   Retraction Strengthening;Both;15 reps;Theraband   Theraband Level (Shoulder Retraction) Level 3 (Green)   Retraction Limitations 2 sets   Shoulder Exercises: Pulleys   Flexion 2 minutes   ABduction 2 minutes   Shoulder Exercises: ROM/Strengthening   UBE (Upper Arm Bike) Level 4 x 6 min; 3 min forward/3 min backward   Modalities   Modalities Cryotherapy   Cryotherapy   Number Minutes Cryotherapy 15 Minutes   Cryotherapy Location Shoulder   Type of Cryotherapy Other (comment)  vaso mod pressure                     PT Long Term Goals - 01/26/15  1558    PT LONG TERM GOAL #1   Title pt will improve Rt UE ROM to [facility] to improve functional mobility with home tasks   Status On-going   PT LONG TERM GOAL #2   Title Pt will improve Rt UE strength to 4/5 in order to perform housework without pain   Status On-going   PT LONG TERM GOAL #3   Title Pt will decrease Rt shoulder pain to 1/10 with grooming activities   Status On-going   PT LONG TERM GOAL #4   Title Pt will be able to use Rt UE to brush teeth without increased symptoms   Status Achieved               Plan - 02/11/15 1601    Clinical Impression Statement Progressing well and tolerated increased strength exercises today.  Continues to benefit from PT to maximize function.   PT Next Visit Plan Continue per plan of care.    Consulted and Agree with Plan of Care Patient        Problem List Patient Active Problem List   Diagnosis Date Noted  . Complete tear of right rotator cuff 12/05/2014  . Wound healing, delayed 05/06/2013  . Candidal skin infection 05/06/2013  . H/O diverticulitis of SIGMOID and left colon s/p laparoscopic assisted left colectomy 04/22/13 02/15/2013  . Diverticulitis of proximal descending colon 02/07/2013  . Hypokalemia 01/26/2013  . Type 2 diabetes mellitus 08/13/2012  . Morbid obesity 08/13/2012  . Insomnia 08/13/2012  . History of ulcerative colitis 08/13/2012  . Musculoskeletal pain 08/13/2012  . Constipation 02/07/2012  . Hyperlipidemia 02/07/2012  . History of migraine headaches 02/07/2012  . Lichen planus 03/55/9741  . Allergic rhinitis 02/07/2012  . Angioedema 02/07/2012  . GERD 02/26/2010  . VITAMIN B12 DEFICIENCY 05/05/2008  . HYPOTHYROIDISM 04/29/2008  . DEPRESSION 04/29/2008  . HYPERTENSION 04/29/2008  . ASTHMA 04/29/2008  . DIVERTICULOSIS, COLON 12/11/2006  . H/O: hysterectomy 02/15/1998   Laureen Abrahams, PT, DPT 02/11/2015 4:16 PM  [facility] [place]  [place], [place], [address] Phone: [phone]   Fax:  [phone]

## 2015-02-16 ENCOUNTER — Ambulatory Visit (INDEPENDENT_AMBULATORY_CARE_PROVIDER_SITE_OTHER): Payer: 59 | Admitting: Physical Therapy

## 2015-02-16 DIAGNOSIS — M7580 Other shoulder lesions, unspecified shoulder: Secondary | ICD-10-CM | POA: Diagnosis not present

## 2015-02-16 DIAGNOSIS — R531 Weakness: Secondary | ICD-10-CM

## 2015-02-16 DIAGNOSIS — M25511 Pain in right shoulder: Secondary | ICD-10-CM

## 2015-02-16 DIAGNOSIS — M25619 Stiffness of unspecified shoulder, not elsewhere classified: Secondary | ICD-10-CM

## 2015-02-16 NOTE — Therapy (Signed)
[place] [address] [place] [address] [place], [place], [address] Phone: [phone]   Fax:  [phone]  Physical Therapy Treatment  Patient Details  Name: [name] MRN: [id number] Date of Birth: 1951-11-09 Referring Provider:  Marchia Bond, MD  Encounter Date: 02/16/2015      PT End of Session - 02/16/15 1523    Visit Number 9   Number of Visits 12   Date for PT Re-Evaluation 02/23/15   PT Start Time 9211   PT Stop Time 1613   PT Time Calculation (min) 57 min   Activity Tolerance Patient tolerated treatment well      Past Medical History  Diagnosis Date  . Asthma   . Diverticulitis   . Diverticulosis   . Thyroid disease     hypo  . Hypertension   . Diabetes mellitus     not on meds.  . Hypothyroidism   . Migraine   . Anemia     none recently  . Depression   . Wears glasses   . GERD (gastroesophageal reflux disease)   . Anxiety   . Complete tear of right rotator cuff 12/05/2014    Past Surgical History  Procedure Laterality Date  . Cesarean section    . Abdominal hysterectomy    . Knee cartilage surgery      Left  . Cataract extraction, bilateral    . Colonoscopy      multiple   . Esophagogastroduodenoscopy endoscopy      multiple  . Right foot surgery  little toe and next toe    x 3  . Laparoscopic partial colectomy N/A 04/22/2013    Procedure: LAPAROSCOPIC PARTIAL COLECTOMY;  Surgeon: Odis Hollingshead, MD;  Location: [facility];  Service: General;  Laterality: N/A;  . Shoulder arthroscopy with rotator cuff repair and subacromial decompression Right 12/05/2014    Procedure: RIGHT SHOULDER ARTHROSCOPY,ACROMOPLASTY, ROTATOR CUFF REPAIR;  Surgeon: [name], MD;  Location: [place];  Service: Orthopedics;  Laterality: Right;    There were no vitals filed for this visit.  Visit Diagnosis:  Decreased range of motion (ROM) of shoulder  Weakness generalized  Pain in joint,  shoulder region, right      Subjective Assessment - 02/16/15 1521    Subjective Pt has nothing new to report.    Patient Stated Goals be able to do housework, dressing, etc with Rt UE   Currently in Pain? Yes   Pain Score 1    Pain Location Shoulder   Pain Orientation Right   Pain Descriptors / Indicators Aching;Dull   Aggravating Factors  reaching back    Pain Relieving Factors resting, ice            [facility] PT Assessment - 02/16/15 0001    Assessment   Medical Diagnosis rt rotator cuff repair   Onset Date 12/05/14   Next MD Visit 02/05/15   AROM   Right Shoulder Flexion 113 Degrees  standing    Right Shoulder ABduction 95 Degrees  standing                   [facility] Adult PT Treatment/Exercise - 02/16/15 0001    Exercises   Exercises Shoulder   Shoulder Exercises: Supine   Protraction 15 reps;Right;Strengthening;Weights   Protraction Weight (lbs) 2#   Flexion Strengthening;Right;20 reps;Weights   Shoulder Flexion Weight (lbs) 1# x 10; 2# x 10   Other Supine Exercises D2 with yellow band x 10  x 2 sets    Shoulder Exercises: Sidelying   External Rotation Strengthening;Right;20 reps;Weights   External Rotation Weight (lbs) 2#, 2 sets of 10    Shoulder Exercises: Standing   Other Standing Exercises Simulated activity: 1# bottle from waist height ot eye level x 10 reps. Rings on lowest arch (11) R <>L x 1 rep each way (both activities challenging)    Shoulder Exercises: Pulleys   Flexion 2 minutes   ABduction 2 minutes   Shoulder Exercises: ROM/Strengthening   UBE (Upper Arm Bike) Level 4 x 6 min; 3 min forward/3 min backward   Rhythmic Stabilization, Supine 3 sets of 30 sec with Rt arm flexed @ 90 deg   Modalities   Modalities Cryotherapy   Cryotherapy   Number Minutes Cryotherapy 15 Minutes   Cryotherapy Location Shoulder   Type of Cryotherapy --  vaso, med pressure                      PT Long Term Goals - 01/26/15 1558    PT LONG TERM  GOAL #1   Title pt will improve Rt UE ROM to [facility] to improve functional mobility with home tasks   Status On-going   PT LONG TERM GOAL #2   Title Pt will improve Rt UE strength to 4/5 in order to perform housework without pain   Status On-going   PT LONG TERM GOAL #3   Title Pt will decrease Rt shoulder pain to 1/10 with grooming activities   Status On-going   PT LONG TERM GOAL #4   Title Pt will be able to use Rt UE to brush teeth without increased symptoms   Status Achieved               Plan - 02/16/15 1559    Clinical Impression Statement Pt tolerated new exercises with minimal increase in symptoms.  Pt demonstrated increased AROM in Rt shoulder from last measurement. Progressing towards goals.    Pt will benefit from skilled therapeutic intervention in order to improve on the following deficits Decreased strength;Decreased range of motion;Pain;Impaired UE functional use   Rehab Potential Excellent   PT Frequency 2x / week   PT Duration 6 weeks   PT Treatment/Interventions Moist Heat;Cryotherapy;Electrical Stimulation;Patient/family education;Therapeutic activities;Therapeutic exercise;Neuromuscular re-education;Manual techniques   PT Next Visit Plan Continue per plan of care.    Consulted and Agree with Plan of Care Patient        Problem List Patient Active Problem List   Diagnosis Date Noted  . Complete tear of right rotator cuff 12/05/2014  . Wound healing, delayed 05/06/2013  . Candidal skin infection 05/06/2013  . H/O diverticulitis of SIGMOID and left colon s/p laparoscopic assisted left colectomy 04/22/13 02/15/2013  . Diverticulitis of proximal descending colon 02/07/2013  . Hypokalemia 01/26/2013  . Type 2 diabetes mellitus 08/13/2012  . Morbid obesity 08/13/2012  . Insomnia 08/13/2012  . History of ulcerative colitis 08/13/2012  . Musculoskeletal pain 08/13/2012  . Constipation 02/07/2012  . Hyperlipidemia 02/07/2012  . History of migraine headaches  02/07/2012  . Lichen planus 33/54/5625  . Allergic rhinitis 02/07/2012  . Angioedema 02/07/2012  . GERD 02/26/2010  . VITAMIN B12 DEFICIENCY 05/05/2008  . HYPOTHYROIDISM 04/29/2008  . DEPRESSION 04/29/2008  . HYPERTENSION 04/29/2008  . ASTHMA 04/29/2008  . DIVERTICULOSIS, COLON 12/11/2006  . H/O: hysterectomy 02/15/1998   Kerin Perna, PTA 02/16/2015 4:02 PM   [facility] [place],  [place], [address] Phone: [phone]   Fax:  [phone]

## 2015-02-19 ENCOUNTER — Encounter: Payer: 59 | Admitting: Physical Therapy

## 2015-02-26 ENCOUNTER — Ambulatory Visit (INDEPENDENT_AMBULATORY_CARE_PROVIDER_SITE_OTHER): Payer: 59 | Admitting: Physical Therapy

## 2015-02-26 DIAGNOSIS — M7580 Other shoulder lesions, unspecified shoulder: Secondary | ICD-10-CM

## 2015-02-26 DIAGNOSIS — R531 Weakness: Secondary | ICD-10-CM

## 2015-02-26 DIAGNOSIS — M25619 Stiffness of unspecified shoulder, not elsewhere classified: Secondary | ICD-10-CM

## 2015-02-26 DIAGNOSIS — M25511 Pain in right shoulder: Secondary | ICD-10-CM | POA: Diagnosis not present

## 2015-02-26 NOTE — Therapy (Signed)
[place], [place], [address] Phone: [phone]   Fax:  [phone]  Physical Therapy Treatment  Patient Details  Name: [name] MRN: [id number] Date of Birth: September 09, 1951 Referring Provider:  Marchia Bond, MD  Encounter Date: 02/26/2015      PT End of Session - 02/26/15 1552    Visit Number 10   Number of Visits 12   Date for PT Re-Evaluation 02/23/15   PT Start Time 1548   PT Stop Time 1645   PT Time Calculation (min) 57 min   Activity Tolerance Patient tolerated treatment well;Patient limited by pain   Behavior During Therapy [facility] for tasks assessed/performed      Past Medical History  Diagnosis Date  . Asthma   . Diverticulitis   . Diverticulosis   . Thyroid disease     hypo  . Hypertension   . Diabetes mellitus     not on meds.  . Hypothyroidism   . Migraine   . Anemia     none recently  . Depression   . Wears glasses   . GERD (gastroesophageal reflux disease)   . Anxiety   . Complete tear of right rotator cuff 12/05/2014    Past Surgical History  Procedure Laterality Date  . Cesarean section    . Abdominal hysterectomy    . Knee cartilage surgery      Left  . Cataract extraction, bilateral    . Colonoscopy      multiple   . Esophagogastroduodenoscopy endoscopy      multiple  . Right foot surgery  little toe and next toe    x 3  . Laparoscopic partial colectomy N/A 04/22/2013    Procedure: LAPAROSCOPIC PARTIAL COLECTOMY;  Surgeon: Odis Hollingshead, MD;  Location: [facility];  Service: General;  Laterality: N/A;  . Shoulder arthroscopy with rotator cuff repair and subacromial decompression Right 12/05/2014    Procedure: RIGHT SHOULDER ARTHROSCOPY,ACROMOPLASTY, ROTATOR CUFF REPAIR;  Surgeon: [name], MD;  Location: [place];  Service: Orthopedics;  Laterality: Right;    There were no vitals filed for this visit.  Visit Diagnosis:   Weakness generalized - Plan: PT plan of care cert/re-cert  Pain in joint, shoulder region, right - Plan: PT plan of care cert/re-cert  Decreased range of motion (ROM) of shoulder - Plan: PT plan of care cert/re-cert      Subjective Assessment - 02/26/15 1549    Subjective Patient can't put arm behing back or raise it up to high cabinets. Able to do dressing and bathing I.   Currently in Pain? Yes   Pain Score 6    Pain Location Shoulder   Pain Orientation Right   Pain Descriptors / Indicators Sharp   Pain Type Surgical pain   Pain Frequency Intermittent   Aggravating Factors  reaching OH or behind back   Pain Relieving Factors rest, ice   Effect of Pain on Daily Activities limits househould chores   Multiple Pain Sites No            [facility] PT Assessment - 02/26/15 0001    Assessment   Medical Diagnosis rt rotator cuff repair   Onset Date 12/05/14   AROM   AROM Assessment Site Shoulder   Right Shoulder Flexion 112 Degrees  120 deg AAROM   Right Shoulder ABduction 105 Degrees  116 active assist   Strength   Overall Strength --  Patient tests strong with  MMT but functionally is weak.   Right/Left Shoulder Right   Right Shoulder Flexion 4+/5   Right Shoulder Extension 5/5   Right Shoulder ABduction 4+/5  in available range   Right Shoulder Internal Rotation 5/5   Right Shoulder External Rotation --  5-/5                   [facility] Adult PT Treatment/Exercise - 02/26/15 0001    Shoulder Exercises: Supine   Flexion Strengthening;Right;20 reps  10 with 0 wt, 10 with 1#   Other Supine Exercises D2 with yellow band   Shoulder Exercises: Sidelying   External Rotation Strengthening;10 reps   External Rotation Weight (lbs) 2#, 2 sets   Flexion Strengthening;Right;20 reps   Flexion Weight (lbs) 2#   Shoulder Exercises: Standing   Other Standing Exercises shelf reach shoulder to OH level 0# x 10   Shoulder Exercises: ROM/Strengthening   UBE (Upper Arm Bike)  Level 4 3'/3'   Modalities   Modalities Cryotherapy;Electrical Stimulation   Cryotherapy   Number Minutes Cryotherapy 15 Minutes   Cryotherapy Location Shoulder   Type of Cryotherapy Other (comment)  vaso 3* med pressure   Electrical Stimulation   Electrical Stimulation Location rt shoulder   Electrical Stimulation Action IFC   Electrical Stimulation Parameters to tolerance   Electrical Stimulation Goals Pain                     PT Long Term Goals - 02/26/15 1553    PT LONG TERM GOAL #1   Title pt will improve Rt UE ROM to [facility] to improve functional mobility with home tasks   Status On-going   PT LONG TERM GOAL #2   Title Pt will improve Rt UE strength to 4/5 in order to perform housework without pain   Status Achieved   PT LONG TERM GOAL #3   Title Pt will decrease Rt shoulder pain to 1/10 with grooming activities  patient reports 5/10 pain with grooming   Status On-going   PT LONG TERM GOAL #4   Title Pt will be able to use Rt UE to brush teeth without increased symptoms   Status Achieved   PT LONG TERM GOAL #5   Title Pt will improve strength functionally and be able to perform overhead chores.   Status New               Plan - 02/26/15 1648    Clinical Impression Statement Patient progressing with strength but functionally is still  unalble to perform OH activity such as reaching into cabinets. She continues to have pain with OH activities including grooming. Her ROM is progressing but limited by pain.   Pt will benefit from skilled therapeutic intervention in order to improve on the following deficits Decreased strength;Decreased range of motion;Pain;Impaired UE functional use   Rehab Potential Good   PT Frequency 2x / week   PT Duration 4 weeks   PT Treatment/Interventions Moist Heat;Cryotherapy;Electrical Stimulation;Patient/family education;Therapeutic activities;Therapeutic exercise;Neuromuscular re-education;Manual techniques;Ultrasound;Other  (comment)  iontophoresis 4mg /ml dexamethasone   PT Next Visit Plan continue with ROM, functional strengthening and pain control to improve funciton   Consulted and Agree with Plan of Care Patient        Problem List Patient Active Problem List   Diagnosis Date Noted  . Complete tear of right rotator cuff 12/05/2014  . Wound healing, delayed 05/06/2013  . Candidal skin infection 05/06/2013  . H/O diverticulitis of SIGMOID and left colon  s/p laparoscopic assisted left colectomy 04/22/13 02/15/2013  . Diverticulitis of proximal descending colon 02/07/2013  . Hypokalemia 01/26/2013  . Type 2 diabetes mellitus 08/13/2012  . Morbid obesity 08/13/2012  . Insomnia 08/13/2012  . History of ulcerative colitis 08/13/2012  . Musculoskeletal pain 08/13/2012  . Constipation 02/07/2012  . Hyperlipidemia 02/07/2012  . History of migraine headaches 02/07/2012  . Lichen planus 51/83/4373  . Allergic rhinitis 02/07/2012  . Angioedema 02/07/2012  . GERD 02/26/2010  . VITAMIN B12 DEFICIENCY 05/05/2008  . HYPOTHYROIDISM 04/29/2008  . DEPRESSION 04/29/2008  . HYPERTENSION 04/29/2008  . ASTHMA 04/29/2008  . DIVERTICULOSIS, COLON 12/11/2006  . H/O: hysterectomy 02/15/1998   Madelyn Flavors PT  02/26/2015, 5:08 PM  [facility] [place], [place], [address] Phone: [phone]   Fax:  [phone]

## 2015-03-02 ENCOUNTER — Ambulatory Visit (INDEPENDENT_AMBULATORY_CARE_PROVIDER_SITE_OTHER): Payer: 59 | Admitting: Physical Therapy

## 2015-03-02 DIAGNOSIS — M25511 Pain in right shoulder: Secondary | ICD-10-CM | POA: Diagnosis not present

## 2015-03-02 DIAGNOSIS — M7580 Other shoulder lesions, unspecified shoulder: Secondary | ICD-10-CM | POA: Diagnosis not present

## 2015-03-02 DIAGNOSIS — R531 Weakness: Secondary | ICD-10-CM | POA: Diagnosis not present

## 2015-03-02 DIAGNOSIS — M25619 Stiffness of unspecified shoulder, not elsewhere classified: Secondary | ICD-10-CM

## 2015-03-02 NOTE — Therapy (Signed)
[place], [place], [address] Phone: [phone]   Fax:  [phone]  Physical Therapy Treatment  Patient Details  Name: [name] MRN: [id number] Date of Birth: [birth year] Referring Provider:  Marchia Bond, MD  Encounter Date: 03/02/2015      PT End of Session - 03/02/15 1411    Visit Number 11   Number of Visits 20   Date for PT Re-Evaluation 03/27/15   PT Start Time 1941   PT Stop Time 1459   PT Time Calculation (min) 57 min   Activity Tolerance Patient tolerated treatment well      Past Medical History  Diagnosis Date  . Asthma   . Diverticulitis   . Diverticulosis   . Thyroid disease     hypo  . Hypertension   . Diabetes mellitus     not on meds.  . Hypothyroidism   . Migraine   . Anemia     none recently  . Depression   . Wears glasses   . GERD (gastroesophageal reflux disease)   . Anxiety   . Complete tear of right rotator cuff 12/05/2014    Past Surgical History  Procedure Laterality Date  . Cesarean section    . Abdominal hysterectomy    . Knee cartilage surgery      Left  . Cataract extraction, bilateral    . Colonoscopy      multiple   . Esophagogastroduodenoscopy endoscopy      multiple  . Right foot surgery  little toe and next toe    x 3  . Laparoscopic partial colectomy N/A 04/22/2013    Procedure: LAPAROSCOPIC PARTIAL COLECTOMY;  Surgeon: Odis Hollingshead, MD;  Location: [facility];  Service: General;  Laterality: N/A;  . Shoulder arthroscopy with rotator cuff repair and subacromial decompression Right 12/05/2014    Procedure: RIGHT SHOULDER ARTHROSCOPY,ACROMOPLASTY, ROTATOR CUFF REPAIR;  Surgeon: [name], MD;  Location: [place];  Service: Orthopedics;  Laterality: Right;    There were no vitals filed for this visit.  Visit Diagnosis:  Weakness generalized  Pain in joint, shoulder region, right  Decreased range of motion  (ROM) of shoulder      Subjective Assessment - 03/02/15 1407    Subjective Pt traveled this wkend for mtg. Wasn't able to do HEP but kept busy (pulling luggage, etc)   Currently in Pain? No/denies  just "sore" but not painful.    Pain Score 0-No pain.  Up to 4-6/10 during therapy session with therapeutic exercise; decreased with cryotherapy.              [facility] PT Assessment - 03/02/15 0001    Assessment   Medical Diagnosis rt rotator cuff repair   Onset Date 12/05/14   AROM   AROM Assessment Site Shoulder   Right/Left Shoulder Right   Right Shoulder Flexion 112 Degrees  standing. 160 deg in supine.    Right Shoulder ABduction 106 Degrees  standing.  116 deg in supine   Right Shoulder Internal Rotation 65 Degrees  supine, with arm abd 90 deg   Right Shoulder External Rotation 72 Degrees  supine, with arm abd to 90 deg                     [facility] Adult PT Treatment/Exercise - 03/02/15 0001    Shoulder Exercises: Supine   Protraction Strengthening;10 reps;Right;Weights  2 sets    Protraction Weight (lbs)  3#   Flexion Strengthening;Right;10 reps;Weights   Shoulder Flexion Weight (lbs) 2# x 10   Shoulder Exercises: Prone   Extension Strengthening;Right;10 reps;Weights   Extension Weight (lbs) 0# x 10, 1# x 2 sets of 10    Horizontal ABduction 1 Strengthening;Right   Horizontal ABduction 1 Weight (lbs) 0# x 10, 2 sets of 10 with 1#    Shoulder Exercises: Sidelying   External Rotation Strengthening;Right;15 reps  2 set   External Rotation Weight (lbs) 1#   External Rotation Limitations unable to tolerate 2# without pain   Shoulder Exercises: Standing   Other Standing Exercises Rings on med-high arch (5 rings R <>L) x 2 trials - difficult    Shoulder Exercises: Pulleys   Flexion 2 minutes   ABduction 2 minutes   Shoulder Exercises: ROM/Strengthening   UBE (Upper Arm Bike) L4 - 2 min each direction   Shoulder Exercises: Stretch   Other Shoulder Stretches IR  towel stretch attempted x 2 x 10 sec (difficult)    Modalities   Modalities Cryotherapy   Cryotherapy   Number Minutes Cryotherapy 15 Minutes   Cryotherapy Location Shoulder   Type of Cryotherapy --  vaso- med pressure                     PT Long Term Goals - 02/26/15 1553    PT LONG TERM GOAL #1   Title pt will improve Rt UE ROM to [facility] to improve functional mobility with home tasks   Status On-going   PT LONG TERM GOAL #2   Title Pt will improve Rt UE strength to 4/5 in order to perform housework without pain   Status Achieved   PT LONG TERM GOAL #3   Title Pt will decrease Rt shoulder pain to 1/10 with grooming activities  patient reports 5/10 pain with grooming   Status On-going   PT LONG TERM GOAL #4   Title Pt will be able to use Rt UE to brush teeth without increased symptoms   Status Achieved   PT LONG TERM GOAL #5   Title Pt will improve strength functionally and be able to perform overhead chores.   Status New               Plan - 03/02/15 1431    Clinical Impression Statement Pt demonstrated improved Rt shoulder ROM. Making gains with strength in Rt shoulder. Pt continues to be limited with IR and overhead motions.  Pt is progressing towards all goals.    Pt will benefit from skilled therapeutic intervention in order to improve on the following deficits Decreased strength;Decreased range of motion;Pain;Impaired UE functional use   Rehab Potential Good   PT Frequency 2x / week   PT Duration 4 weeks   PT Treatment/Interventions Moist Heat;Cryotherapy;Electrical Stimulation;Patient/family education;Therapeutic activities;Therapeutic exercise;Neuromuscular re-education;Manual techniques;Ultrasound;Other (comment)   PT Next Visit Plan continue with ROM, functional strengthening and pain control to improve funciton   Consulted and Agree with Plan of Care Patient        Problem List Patient Active Problem List   Diagnosis Date Noted  . Complete  tear of right rotator cuff 12/05/2014  . Wound healing, delayed 05/06/2013  . Candidal skin infection 05/06/2013  . H/O diverticulitis of SIGMOID and left colon s/p laparoscopic assisted left colectomy 04/22/13 02/15/2013  . Diverticulitis of proximal descending colon 02/07/2013  . Hypokalemia 01/26/2013  . Type 2 diabetes mellitus 08/13/2012  . Morbid obesity 08/13/2012  . Insomnia 08/13/2012  .  History of ulcerative colitis 08/13/2012  . Musculoskeletal pain 08/13/2012  . Constipation 02/07/2012  . Hyperlipidemia 02/07/2012  . History of migraine headaches 02/07/2012  . Lichen planus 90/38/3338  . Allergic rhinitis 02/07/2012  . Angioedema 02/07/2012  . GERD 02/26/2010  . VITAMIN B12 DEFICIENCY 05/05/2008  . HYPOTHYROIDISM 04/29/2008  . DEPRESSION 04/29/2008  . HYPERTENSION 04/29/2008  . ASTHMA 04/29/2008  . DIVERTICULOSIS, COLON 12/11/2006  . H/O: hysterectomy 02/15/1998    Kerin Perna, PTA 03/02/2015 3:01 PM  [facility] [place], [place], [address] Phone: [phone]   Fax:  [phone]

## 2015-03-04 ENCOUNTER — Ambulatory Visit (INDEPENDENT_AMBULATORY_CARE_PROVIDER_SITE_OTHER): Payer: 59 | Admitting: Physical Therapy

## 2015-03-04 DIAGNOSIS — M7580 Other shoulder lesions, unspecified shoulder: Secondary | ICD-10-CM

## 2015-03-04 DIAGNOSIS — M25511 Pain in right shoulder: Secondary | ICD-10-CM | POA: Diagnosis not present

## 2015-03-04 DIAGNOSIS — R531 Weakness: Secondary | ICD-10-CM

## 2015-03-04 DIAGNOSIS — M25619 Stiffness of unspecified shoulder, not elsewhere classified: Secondary | ICD-10-CM

## 2015-03-04 NOTE — Therapy (Signed)
[place], [place], [address] Phone: [phone]   Fax:  [phone]  Physical Therapy Treatment  Patient Details  Name: [name] MRN: [id number] Date of Birth: [birth year] Referring Provider:  Marchia Bond, MD  Encounter Date: 03/04/2015      PT End of Session - 03/04/15 1433    Visit Number 12   Number of Visits 20   Date for PT Re-Evaluation 03/27/15   PT Start Time 1430   PT Stop Time 1529   PT Time Calculation (min) 59 min   Activity Tolerance Patient tolerated treatment well      Past Medical History  Diagnosis Date  . Asthma   . Diverticulitis   . Diverticulosis   . Thyroid disease     hypo  . Hypertension   . Diabetes mellitus     not on meds.  . Hypothyroidism   . Migraine   . Anemia     none recently  . Depression   . Wears glasses   . GERD (gastroesophageal reflux disease)   . Anxiety   . Complete tear of right rotator cuff 12/05/2014    Past Surgical History  Procedure Laterality Date  . Cesarean section    . Abdominal hysterectomy    . Knee cartilage surgery      Left  . Cataract extraction, bilateral    . Colonoscopy      multiple   . Esophagogastroduodenoscopy endoscopy      multiple  . Right foot surgery  little toe and next toe    x 3  . Laparoscopic partial colectomy N/A 04/22/2013    Procedure: LAPAROSCOPIC PARTIAL COLECTOMY;  Surgeon: Odis Hollingshead, MD;  Location: [facility];  Service: General;  Laterality: N/A;  . Shoulder arthroscopy with rotator cuff repair and subacromial decompression Right 12/05/2014    Procedure: RIGHT SHOULDER ARTHROSCOPY,ACROMOPLASTY, ROTATOR CUFF REPAIR;  Surgeon: [name], MD;  Location: [facility];  Service: Orthopedics;  Laterality: Right;    There were no vitals filed for this visit.  Visit Diagnosis:  Weakness generalized  Pain in joint, shoulder region, right  Decreased range of motion  (ROM) of shoulder      Subjective Assessment - 03/04/15 1434    Subjective Pt reports went to MD this morning; said she was right on track. Pt noting laundry getting easier to do.    Currently in Pain? Yes   Pain Score 3    Pain Location Shoulder   Pain Orientation Right   Pain Descriptors / Indicators Aching            [facility] PT Assessment - 03/04/15 0001    Assessment   Medical Diagnosis rt rotator cuff repair   Onset Date 12/05/14                     [facility] Adult PT Treatment/Exercise - 03/04/15 0001    Shoulder Exercises: Standing   External Rotation Strengthening;Right;10 reps   Theraband Level (Shoulder External Rotation) Level 2 (Red)  green band too difficult   Internal Rotation Strengthening;Right;10 reps   Theraband Level (Shoulder Internal Rotation) Level 3 (Green)  2 sets    Flexion Strengthening;Right;10 reps   Shoulder Flexion Weight (lbs) 2# x 10 reps x 2 sets    Extension Right;10 reps;Strengthening;Theraband   Theraband Level (Shoulder Extension) Level 3 (Green)   Row Strengthening;Right;10 reps;Theraband   Theraband Level (Shoulder Row) Level 3 (Green)  Other Standing Exercises Rings on med-high arch (8 rings Lt to Rt). Unable to tolerate any more at this height; lowered to med height.  11 rings L<>R x 1    Other Standing Exercises IR stretch with broom behind back x 10 reps; B bicep curls with 4# x 10     Shoulder Exercises: Pulleys   Flexion 2 minutes   ABduction 2 minutes   Shoulder Exercises: ROM/Strengthening   UBE (Upper Arm Bike) L3- 2 min each way standing   Modalities   Modalities Cryotherapy;Ultrasound   Cryotherapy   Number Minutes Cryotherapy 15 Minutes   Cryotherapy Location Shoulder   Type of Cryotherapy --  vaso, med pressure   Ultrasound   Ultrasound Location Rt ant shoulder   Ultrasound Parameters 50%, 3.3 mhz, 1.2 w/cm2    Ultrasound Goals Pain                     PT Long Term Goals - 02/26/15 1553     PT LONG TERM GOAL #1   Title pt will improve Rt UE ROM to [facility] to improve functional mobility with home tasks   Status On-going   PT LONG TERM GOAL #2   Title Pt will improve Rt UE strength to 4/5 in order to perform housework without pain   Status Achieved   PT LONG TERM GOAL #3   Title Pt will decrease Rt shoulder pain to 1/10 with grooming activities  patient reports 5/10 pain with grooming   Status On-going   PT LONG TERM GOAL #4   Title Pt will be able to use Rt UE to brush teeth without increased symptoms   Status Achieved   PT LONG TERM GOAL #5   Title Pt will improve strength functionally and be able to perform overhead chores.   Status New               Plan - 03/04/15 1715    Clinical Impression Statement Pt able to tolerate increased resistance with therapeutic exercise today.  Pt did c/o increased R ant shoulder pain once fatigued from exercise; reduced with [place] and vaso. Pt is continuing to make gains towards goals.    Pt will benefit from skilled therapeutic intervention in order to improve on the following deficits Decreased strength;Decreased range of motion;Pain;Impaired UE functional use   Rehab Potential Good   PT Frequency 2x / week   PT Duration 4 weeks   PT Treatment/Interventions Moist Heat;Cryotherapy;Electrical Stimulation;Patient/family education;Therapeutic activities;Therapeutic exercise;Neuromuscular re-education;Manual techniques;Ultrasound;Other (comment)   PT Next Visit Plan continue with ROM, functional strengthening and pain control to improve funciton   Consulted and Agree with Plan of Care Patient        Problem List Patient Active Problem List   Diagnosis Date Noted  . Complete tear of right rotator cuff 12/05/2014  . Wound healing, delayed 05/06/2013  . Candidal skin infection 05/06/2013  . H/O diverticulitis of SIGMOID and left colon s/p laparoscopic assisted left colectomy 04/22/13 02/15/2013  . Diverticulitis of proximal  descending colon 02/07/2013  . Hypokalemia 01/26/2013  . Type 2 diabetes mellitus 08/13/2012  . Morbid obesity 08/13/2012  . Insomnia 08/13/2012  . History of ulcerative colitis 08/13/2012  . Musculoskeletal pain 08/13/2012  . Constipation 02/07/2012  . Hyperlipidemia 02/07/2012  . History of migraine headaches 02/07/2012  . Lichen planus 09/47/0962  . Allergic rhinitis 02/07/2012  . Angioedema 02/07/2012  . GERD 02/26/2010  . VITAMIN B12 DEFICIENCY 05/05/2008  . HYPOTHYROIDISM 04/29/2008  .  DEPRESSION 04/29/2008  . HYPERTENSION 04/29/2008  . ASTHMA 04/29/2008  . DIVERTICULOSIS, COLON 12/11/2006  . H/O: hysterectomy 02/15/1998   Kerin Perna, PTA 03/04/2015 5:17 PM  [place], [place], [address] Phone: [phone]   Fax:  [phone]

## 2015-03-11 ENCOUNTER — Ambulatory Visit (INDEPENDENT_AMBULATORY_CARE_PROVIDER_SITE_OTHER): Payer: 59 | Admitting: Physical Therapy

## 2015-03-11 DIAGNOSIS — M25511 Pain in right shoulder: Secondary | ICD-10-CM

## 2015-03-11 DIAGNOSIS — M7580 Other shoulder lesions, unspecified shoulder: Secondary | ICD-10-CM

## 2015-03-11 DIAGNOSIS — M25619 Stiffness of unspecified shoulder, not elsewhere classified: Secondary | ICD-10-CM

## 2015-03-11 DIAGNOSIS — R531 Weakness: Secondary | ICD-10-CM | POA: Diagnosis not present

## 2015-03-11 NOTE — Therapy (Signed)
[place], [place], [address] Phone: [phone]   Fax:  [phone]  Physical Therapy Treatment  Patient Details  Name: [name] MRN: [id number] Date of Birth: November 07, 1951 Referring Provider:  Marchia Bond, MD  Encounter Date: 03/11/2015      PT End of Session - 03/11/15 1558    Visit Number 13   Number of Visits 20   Date for PT Re-Evaluation 03/27/15   PT Start Time 8341   PT Stop Time 1611   PT Time Calculation (min) 56 min   Activity Tolerance Patient tolerated treatment well  Increased pain with some exercises.       Past Medical History  Diagnosis Date  . Asthma   . Diverticulitis   . Diverticulosis   . Thyroid disease     hypo  . Hypertension   . Diabetes mellitus     not on meds.  . Hypothyroidism   . Migraine   . Anemia     none recently  . Depression   . Wears glasses   . GERD (gastroesophageal reflux disease)   . Anxiety   . Complete tear of right rotator cuff 12/05/2014    Past Surgical History  Procedure Laterality Date  . Cesarean section    . Abdominal hysterectomy    . Knee cartilage surgery      Left  . Cataract extraction, bilateral    . Colonoscopy      multiple   . Esophagogastroduodenoscopy endoscopy      multiple  . Right foot surgery  little toe and next toe    x 3  . Laparoscopic partial colectomy N/A 04/22/2013    Procedure: LAPAROSCOPIC PARTIAL COLECTOMY;  Surgeon: Odis Hollingshead, MD;  Location: [facility];  Service: General;  Laterality: N/A;  . Shoulder arthroscopy with rotator cuff repair and subacromial decompression Right 12/05/2014    Procedure: RIGHT SHOULDER ARTHROSCOPY,ACROMOPLASTY, ROTATOR CUFF REPAIR;  Surgeon: [name], MD;  Location: [facility];  Service: Orthopedics;  Laterality: Right;    There were no vitals filed for this visit.  Visit Diagnosis:  Weakness generalized  Pain in joint, shoulder  region, right  Decreased range of motion (ROM) of shoulder      Subjective Assessment - 03/11/15 1519    Subjective Pt reported she is now able to perform toileting hygiene without using assistive device (shoulder IR).    Currently in Pain? No/denies            [facility] PT Assessment - 03/11/15 0001    Assessment   Medical Diagnosis rt rotator cuff repair   Onset Date 12/05/14   AROM   AROM Assessment Site Shoulder   Right/Left Shoulder Right   Right Shoulder Flexion 122 Degrees  standing; 160 deg in supine   Right Shoulder ABduction 112 Degrees  standing;    Right Shoulder Internal Rotation 70 Degrees  supine, at 90 deg abd   Right Shoulder External Rotation 74 Degrees  supine, at 90 deg abd                     [facility] Adult PT Treatment/Exercise - 03/11/15 0001    Shoulder Exercises: Supine   Flexion Strengthening;Right;10 reps   Shoulder Flexion Weight (lbs) 2#   painful at 45 deg to 0   Shoulder Exercises: Prone   Extension Strengthening;10 reps;Right   Extension Weight (lbs) 2#   Other Prone Exercises Prone rowing  with 2# x 10 reps x 2 sets.    Shoulder Exercises: Sidelying   External Rotation Right;10 reps   External Rotation Weight (lbs) 2#, 2 sets   Shoulder Exercises: ROM/Strengthening   UBE (Upper Arm Bike) L4: 2 min each way    Rhythmic Stabilization, Supine 3 sets of 30 sec with Rt arm flexed @ 90 deg and 110 deg   too painful @ 45 deg   Modalities   Modalities Cryotherapy   Cryotherapy   Number Minutes Cryotherapy 15 Minutes   Cryotherapy Location Shoulder   Type of Cryotherapy --  vaso, 3*, med pressure                     PT Long Term Goals - 02/26/15 1553    PT LONG TERM GOAL #1   Title pt will improve Rt UE ROM to [facility] to improve functional mobility with home tasks   Status On-going   PT LONG TERM GOAL #2   Title Pt will improve Rt UE strength to 4/5 in order to perform housework without pain   Status Achieved   PT  LONG TERM GOAL #3   Title Pt will decrease Rt shoulder pain to 1/10 with grooming activities  patient reports 5/10 pain with grooming   Status On-going   PT LONG TERM GOAL #4   Title Pt will be able to use Rt UE to brush teeth without increased symptoms   Status Achieved   PT LONG TERM GOAL #5   Title Pt will improve strength functionally and be able to perform overhead chores.   Status New               Plan - 03/11/15 1543    Clinical Impression Statement Pt demo improved Rt shoulder ROM in standing. Pt reporting decreased pain in Rt shoulder to 4/10 with grooming and improved functional use at home.  Pt reported increase in pain to 4-5/10 with ther ex; reduced with use of cryotherapy.    Pt will benefit from skilled therapeutic intervention in order to improve on the following deficits Decreased strength;Decreased range of motion;Pain;Impaired UE functional use   Rehab Potential Good   PT Frequency 2x / week   PT Duration 4 weeks   PT Treatment/Interventions Moist Heat;Cryotherapy;Electrical Stimulation;Patient/family education;Therapeutic activities;Therapeutic exercise;Neuromuscular re-education;Manual techniques;Ultrasound;Other (comment)   PT Next Visit Plan continue with ROM, functional strengthening and pain control to improve funciton   Consulted and Agree with Plan of Care Patient        Problem List Patient Active Problem List   Diagnosis Date Noted  . Complete tear of right rotator cuff 12/05/2014  . Wound healing, delayed 05/06/2013  . Candidal skin infection 05/06/2013  . H/O diverticulitis of SIGMOID and left colon s/p laparoscopic assisted left colectomy 04/22/13 02/15/2013  . Diverticulitis of proximal descending colon 02/07/2013  . Hypokalemia 01/26/2013  . Type 2 diabetes mellitus 08/13/2012  . Morbid obesity 08/13/2012  . Insomnia 08/13/2012  . History of ulcerative colitis 08/13/2012  . Musculoskeletal pain 08/13/2012  . Constipation 02/07/2012  .  Hyperlipidemia 02/07/2012  . History of migraine headaches 02/07/2012  . Lichen planus 32/99/2426  . Allergic rhinitis 02/07/2012  . Angioedema 02/07/2012  . GERD 02/26/2010  . VITAMIN B12 DEFICIENCY 05/05/2008  . HYPOTHYROIDISM 04/29/2008  . DEPRESSION 04/29/2008  . HYPERTENSION 04/29/2008  . ASTHMA 04/29/2008  . DIVERTICULOSIS, COLON 12/11/2006  . H/O: hysterectomy 02/15/1998   Kerin Perna, PTA 03/11/2015 5:10 PM  [facility] [place] [address] [place], [place], [address] Phone: [phone]   Fax:  [phone]

## 2015-03-13 ENCOUNTER — Encounter: Payer: 59 | Admitting: Physical Therapy

## 2015-03-16 ENCOUNTER — Ambulatory Visit (INDEPENDENT_AMBULATORY_CARE_PROVIDER_SITE_OTHER): Payer: 59 | Admitting: Physical Therapy

## 2015-03-16 DIAGNOSIS — M7580 Other shoulder lesions, unspecified shoulder: Secondary | ICD-10-CM

## 2015-03-16 DIAGNOSIS — R531 Weakness: Secondary | ICD-10-CM

## 2015-03-16 DIAGNOSIS — M25619 Stiffness of unspecified shoulder, not elsewhere classified: Secondary | ICD-10-CM

## 2015-03-16 DIAGNOSIS — M25511 Pain in right shoulder: Secondary | ICD-10-CM

## 2015-03-16 NOTE — Therapy (Signed)
[place], [place], [address] Phone: [phone]   Fax:  [phone]  Physical Therapy Treatment  Patient Details  Name: [name] MRN: [id number] Date of Birth: August 18, 1951 Referring Provider:  Marchia Bond, MD  Encounter Date: 03/16/2015      PT End of Session - 03/16/15 1522    Visit Number 14   Number of Visits 20   Date for PT Re-Evaluation 03/27/15   PT Start Time 1519   PT Stop Time 1618   PT Time Calculation (min) 59 min   Activity Tolerance Patient tolerated treatment well      Past Medical History  Diagnosis Date  . Asthma   . Diverticulitis   . Diverticulosis   . Thyroid disease     hypo  . Hypertension   . Diabetes mellitus     not on meds.  . Hypothyroidism   . Migraine   . Anemia     none recently  . Depression   . Wears glasses   . GERD (gastroesophageal reflux disease)   . Anxiety   . Complete tear of right rotator cuff 12/05/2014    Past Surgical History  Procedure Laterality Date  . Cesarean section    . Abdominal hysterectomy    . Knee cartilage surgery      Left  . Cataract extraction, bilateral    . Colonoscopy      multiple   . Esophagogastroduodenoscopy endoscopy      multiple  . Right foot surgery  little toe and next toe    x 3  . Laparoscopic partial colectomy N/A 04/22/2013    Procedure: LAPAROSCOPIC PARTIAL COLECTOMY;  Surgeon: Odis Hollingshead, MD;  Location: [facility];  Service: General;  Laterality: N/A;  . Shoulder arthroscopy with rotator cuff repair and subacromial decompression Right 12/05/2014    Procedure: RIGHT SHOULDER ARTHROSCOPY,ACROMOPLASTY, ROTATOR CUFF REPAIR;  Surgeon: [name], MD;  Location: [facility];  Service: Orthopedics;  Laterality: Right;    There were no vitals filed for this visit.  Visit Diagnosis:  Weakness generalized  Pain in joint, shoulder region, right  Decreased range of motion  (ROM) of shoulder      Subjective Assessment - 03/16/15 1523    Currently in Pain? Yes   Pain Score 3    Pain Location Shoulder   Pain Orientation Right   Pain Descriptors / Indicators Sore   Aggravating Factors  reaching behind back or overhead    Pain Relieving Factors rest and ice.             [facility] PT Assessment - 03/16/15 0001    Assessment   Medical Diagnosis rt rotator cuff repair   Onset Date 12/05/14   AROM   AROM Assessment Site Shoulder   Right/Left Shoulder Right   Right Shoulder Flexion 130 Degrees  in standing    Right Shoulder ABduction 115 Degrees  in standing    Right Shoulder External Rotation 75 Degrees  at 90 deg abduction                      [facility] Adult PT Treatment/Exercise - 03/16/15 0001    Shoulder Exercises: Standing   External Rotation Strengthening;Right;10 reps;Theraband  2 sets   Theraband Level (Shoulder External Rotation) Level 3 (Green)   Internal Rotation Strengthening;Right;10 reps;Theraband  2 sets    Theraband Level (Shoulder Internal Rotation) Level 3 (Green)   Flexion  Strengthening;Right;10 reps  rockwood 4- punch forward   Theraband Level (Shoulder Flexion) Level 3 (Green)   Extension Strengthening;Right;10 reps   Theraband Level (Shoulder Extension) Level 3 (Green)   Other Standing Exercises attempted IR stretch with strap, unable (too painful).  IR with broomstick up and over buttocks (BUE) x 10 reps    Shoulder Exercises: ROM/Strengthening   UBE (Upper Arm Bike) L3: 2 min each direction    Rhythmic Stabilization, Supine 3 sets of 30 sec with Rt arm flexed @ 90 deg and 110 deg   too painful @ 45 deg   Modalities   Modalities Cryotherapy;Ultrasound   Cryotherapy   Number Minutes Cryotherapy 15 Minutes   Cryotherapy Location Shoulder   Type of Cryotherapy --  vaso, med, 3*   Ultrasound   Ultrasound Location Rt ant shoulder    Ultrasound Parameters 50%, 3.3 mHz, 1.1 w/cm2 x 8 min    Ultrasound Goals Pain                      PT Long Term Goals - 03/16/15 1530    PT LONG TERM GOAL #3   Title Pt will decrease Rt shoulder pain to 1/10 with grooming activities   Status Partially Met  2/10 pain reported 5/2.                Plan - 03/16/15 1609    Clinical Impression Statement Pt demo improved Rt shoulder ROM in standing compared to last measurement; steady progress. Pt reporting decreased pain to 2/10 with grooming. Pt able to tolerate increased resistance with ther ex; given green band for HEP progression.    Pt will benefit from skilled therapeutic intervention in order to improve on the following deficits Decreased strength;Decreased range of motion;Pain;Impaired UE functional use   Rehab Potential Good   PT Duration 4 weeks   PT Treatment/Interventions Moist Heat;Cryotherapy;Electrical Stimulation;Patient/family education;Therapeutic activities;Therapeutic exercise;Neuromuscular re-education;Manual techniques;Ultrasound;Other (comment)   PT Next Visit Plan continue with ROM, functional strengthening and pain control to improve funciton   Consulted and Agree with Plan of Care Patient        Problem List Patient Active Problem List   Diagnosis Date Noted  . Complete tear of right rotator cuff 12/05/2014  . Wound healing, delayed 05/06/2013  . Candidal skin infection 05/06/2013  . H/O diverticulitis of SIGMOID and left colon s/p laparoscopic assisted left colectomy 04/22/13 02/15/2013  . Diverticulitis of proximal descending colon 02/07/2013  . Hypokalemia 01/26/2013  . Type 2 diabetes mellitus 08/13/2012  . Morbid obesity 08/13/2012  . Insomnia 08/13/2012  . History of ulcerative colitis 08/13/2012  . Musculoskeletal pain 08/13/2012  . Constipation 02/07/2012  . Hyperlipidemia 02/07/2012  . History of migraine headaches 02/07/2012  . Lichen planus 64/40/3474  . Allergic rhinitis 02/07/2012  . Angioedema 02/07/2012  . GERD 02/26/2010  . VITAMIN B12  DEFICIENCY 05/05/2008  . HYPOTHYROIDISM 04/29/2008  . DEPRESSION 04/29/2008  . HYPERTENSION 04/29/2008  . ASTHMA 04/29/2008  . DIVERTICULOSIS, COLON 12/11/2006  . H/O: hysterectomy 02/15/1998   Kerin Perna, PTA 03/16/2015 4:14 PM   [facility] [place], [place], [address] Phone: [phone]   Fax:  [phone]

## 2015-03-19 ENCOUNTER — Ambulatory Visit (INDEPENDENT_AMBULATORY_CARE_PROVIDER_SITE_OTHER): Payer: 59 | Admitting: Physical Therapy

## 2015-03-19 ENCOUNTER — Encounter: Payer: Self-pay | Admitting: Physical Therapy

## 2015-03-19 DIAGNOSIS — M7580 Other shoulder lesions, unspecified shoulder: Secondary | ICD-10-CM

## 2015-03-19 DIAGNOSIS — M25619 Stiffness of unspecified shoulder, not elsewhere classified: Secondary | ICD-10-CM

## 2015-03-19 DIAGNOSIS — R531 Weakness: Secondary | ICD-10-CM

## 2015-03-19 DIAGNOSIS — M25511 Pain in right shoulder: Secondary | ICD-10-CM | POA: Diagnosis not present

## 2015-03-19 NOTE — Therapy (Signed)
[place], [place], [address] Phone: [phone]   Fax:  [phone]  Physical Therapy Treatment  Patient Details  Name: [name] MRN: [id number] Date of Birth: 01/19/1951 Referring Provider:  Marchia Bond, MD  Encounter Date: 03/19/2015      PT End of Session - 03/19/15 1540    Visit Number 15   Number of Visits 20   Date for PT Re-Evaluation 03/27/15   PT Start Time 1536   PT Stop Time 1640   PT Time Calculation (min) 64 min   Activity Tolerance Patient tolerated treatment well      Past Medical History  Diagnosis Date  . Asthma   . Diverticulitis   . Diverticulosis   . Thyroid disease     hypo  . Hypertension   . Diabetes mellitus     not on meds.  . Hypothyroidism   . Migraine   . Anemia     none recently  . Depression   . Wears glasses   . GERD (gastroesophageal reflux disease)   . Anxiety   . Complete tear of right rotator cuff 12/05/2014    Past Surgical History  Procedure Laterality Date  . Cesarean section    . Abdominal hysterectomy    . Knee cartilage surgery      Left  . Cataract extraction, bilateral    . Colonoscopy      multiple   . Esophagogastroduodenoscopy endoscopy      multiple  . Right foot surgery  little toe and next toe    x 3  . Laparoscopic partial colectomy N/A 04/22/2013    Procedure: LAPAROSCOPIC PARTIAL COLECTOMY;  Surgeon: Odis Hollingshead, MD;  Location: [facility];  Service: General;  Laterality: N/A;  . Shoulder arthroscopy with rotator cuff repair and subacromial decompression Right 12/05/2014    Procedure: RIGHT SHOULDER ARTHROSCOPY,ACROMOPLASTY, ROTATOR CUFF REPAIR;  Surgeon: [name], MD;  Location: [facility];  Service: Orthopedics;  Laterality: Right;    There were no vitals filed for this visit.  Visit Diagnosis:  Weakness generalized  Pain in joint, shoulder region, right  Decreased range of motion  (ROM) of shoulder      Subjective Assessment - 03/19/15 1539    Subjective Pt states today is a good day, she has had some tenderness/soreness lately however not pain.    Currently in Pain? No/denies            [facility] PT Assessment - 03/19/15 0001    Assessment   Medical Diagnosis rt rotator cuff repair   Onset Date 12/05/14   AROM   AROM Assessment Site Shoulder   Right Shoulder External Rotation 75 Degrees  with less pain                     [facility] Adult PT Treatment/Exercise - 03/19/15 0001    Shoulder Exercises: Supine   Other Supine Exercises 3x10 overhead pull with green band   Other Supine Exercises 20 reps 5 sec hold thoracic lifts   Shoulder Exercises: Sidelying   External Rotation Right;Weights  3x10 with towel under elbow   External Rotation Weight (lbs) 2#   Other Sidelying Exercises 3x10 empty can , small ROM with 1#   Shoulder Exercises: Standing   Other Standing Exercises  full can to 90 degrees flex with 1#, 10 each bilat, single. alternating   Shoulder Exercises: ROM/Strengthening   UBE (Upper Arm Bike)  L3: 2 min each direction    Modalities   Modalities Cryotherapy;Ultrasound   Cryotherapy   Number Minutes Cryotherapy 15 Minutes   Cryotherapy Location Shoulder   Type of Cryotherapy --  vaso, med pressure, 3*   Ultrasound   Ultrasound Location anterior Rt deltoid   Ultrasound Parameters 100% 3.3 to 1.0 mhz, 1.5 w/cm2   Ultrasound Goals Pain   Manual Therapy   Manual Therapy Joint mobilization  stretching   Joint Mobilization into ER and ext for reaching behind the back with arm off edge of bed. stretching into ER in supine                     PT Long Term Goals - 03/16/15 1530    PT LONG TERM GOAL #3   Title Pt will decrease Rt shoulder pain to 1/10 with grooming activities   Status Partially Met  2/10 pain reported 5/2.                Plan - 03/19/15 1559    Clinical Impression Statement Pt is getting  stronger, pain is decreasing and she is able to perform functional activities easier.  Progressing well with protocol. Responded well to jt mobs/stretching, able to reach behind her back easier.    Pt will benefit from skilled therapeutic intervention in order to improve on the following deficits Decreased strength;Decreased range of motion;Pain;Impaired UE functional use   Rehab Potential Good   PT Frequency 2x / week   PT Duration 4 weeks   PT Treatment/Interventions Moist Heat;Cryotherapy;Electrical Stimulation;Patient/family education;Therapeutic activities;Therapeutic exercise;Neuromuscular re-education;Manual techniques;Ultrasound;Other (comment)   PT Next Visit Plan assess end of next week, perform FOTO   Consulted and Agree with Plan of Care Patient        Problem List Patient Active Problem List   Diagnosis Date Noted  . Complete tear of right rotator cuff 12/05/2014  . Wound healing, delayed 05/06/2013  . Candidal skin infection 05/06/2013  . H/O diverticulitis of SIGMOID and left colon s/p laparoscopic assisted left colectomy 04/22/13 02/15/2013  . Diverticulitis of proximal descending colon 02/07/2013  . Hypokalemia 01/26/2013  . Type 2 diabetes mellitus 08/13/2012  . Morbid obesity 08/13/2012  . Insomnia 08/13/2012  . History of ulcerative colitis 08/13/2012  . Musculoskeletal pain 08/13/2012  . Constipation 02/07/2012  . Hyperlipidemia 02/07/2012  . History of migraine headaches 02/07/2012  . Lichen planus 65/99/3570  . Allergic rhinitis 02/07/2012  . Angioedema 02/07/2012  . GERD 02/26/2010  . VITAMIN B12 DEFICIENCY 05/05/2008  . HYPOTHYROIDISM 04/29/2008  . DEPRESSION 04/29/2008  . HYPERTENSION 04/29/2008  . ASTHMA 04/29/2008  . DIVERTICULOSIS, COLON 12/11/2006  . H/O: hysterectomy 02/15/1998    Jeral Pinch, PT 03/19/2015, 4:31 PM  [facility] [place], [place], [address] Phone:  [phone]   Fax:  [phone]

## 2015-03-23 ENCOUNTER — Ambulatory Visit (INDEPENDENT_AMBULATORY_CARE_PROVIDER_SITE_OTHER): Payer: 59 | Admitting: Physical Therapy

## 2015-03-23 DIAGNOSIS — M7580 Other shoulder lesions, unspecified shoulder: Secondary | ICD-10-CM

## 2015-03-23 DIAGNOSIS — R531 Weakness: Secondary | ICD-10-CM

## 2015-03-23 DIAGNOSIS — M25619 Stiffness of unspecified shoulder, not elsewhere classified: Secondary | ICD-10-CM

## 2015-03-23 DIAGNOSIS — M25511 Pain in right shoulder: Secondary | ICD-10-CM | POA: Diagnosis not present

## 2015-03-23 NOTE — Therapy (Addendum)
[place], [place], [address] Phone: [phone]   Fax:  [phone]  Physical Therapy Treatment  Patient Details  Name: [name] MRN: [id number] Date of Birth: January 04, 1951 Referring Provider:  Marchia Bond, MD  Encounter Date: 03/23/2015      PT End of Session - 03/23/15 1526    Visit Number 16   Number of Visits 20   Date for PT Re-Evaluation 03/27/15   PT Start Time 1522   PT Stop Time 8938   PT Time Calculation (min) 53 min      Past Medical History  Diagnosis Date  . Asthma   . Diverticulitis   . Diverticulosis   . Thyroid disease     hypo  . Hypertension   . Diabetes mellitus     not on meds.  . Hypothyroidism   . Migraine   . Anemia     none recently  . Depression   . Wears glasses   . GERD (gastroesophageal reflux disease)   . Anxiety   . Complete tear of right rotator cuff 12/05/2014    Past Surgical History  Procedure Laterality Date  . Cesarean section    . Abdominal hysterectomy    . Knee cartilage surgery      Left  . Cataract extraction, bilateral    . Colonoscopy      multiple   . Esophagogastroduodenoscopy endoscopy      multiple  . Right foot surgery  little toe and next toe    x 3  . Laparoscopic partial colectomy N/A 04/22/2013    Procedure: LAPAROSCOPIC PARTIAL COLECTOMY;  Surgeon: Odis Hollingshead, MD;  Location: [facility];  Service: General;  Laterality: N/A;  . Shoulder arthroscopy with rotator cuff repair and subacromial decompression Right 12/05/2014    Procedure: RIGHT SHOULDER ARTHROSCOPY,ACROMOPLASTY, ROTATOR CUFF REPAIR;  Surgeon: [name], MD;  Location: [place];  Service: Orthopedics;  Laterality: Right;    There were no vitals filed for this visit.  Visit Diagnosis:  Weakness generalized  Pain in joint, shoulder region, right  Decreased range of motion (ROM) of shoulder      Subjective Assessment -  03/23/15 1526    Subjective Pt reports the manual therapy during last session gave her more motion in shoulder. Feels she can reach behind back more.    Currently in Pain? No/denies            [facility] PT Assessment - 03/23/15 0001    Assessment   Medical Diagnosis rt rotator cuff repair   Onset Date 12/05/14                     [facility] Adult PT Treatment/Exercise - 03/23/15 0001    Shoulder Exercises: Standing   External Rotation Strengthening;Right;15 reps   Theraband Level (Shoulder External Rotation) Level 3 (Green)   Flexion Right;15 reps   Shoulder Flexion Weight (lbs) 2# x 2 sets    ABduction Right;15 reps   Shoulder ABduction Weight (lbs) 1# x 2 sets   ABduction Limitations mirror for visual feedback    Extension Strengthening;Right;10 reps   Theraband Level (Shoulder Extension) Level 3 (Green)   Other Standing Exercises Standing: rings on medium arch (11 Rt to/from left) x 1 rep.  1# weight off eye level shelf to counter x 10 reps (2# /3# too heavy- unable to control descent)    Shoulder Exercises: ROM/Strengthening   UBE (Upper Arm  Bike) L3: 2 min each direction    Modalities   Modalities Cryotherapy   Cryotherapy   Number Minutes Cryotherapy 15 Minutes   Cryotherapy Location Shoulder   Type of Cryotherapy --  vaso, med, 3*   Manual Therapy   Manual Therapy Passive ROM;Joint mobilization;Myofascial release   Joint Mobilization oscillations into Rt shoulder ER/ ABD  pt very guarded.    Passive ROM into ext/ IR    Other Manual Therapy Rt pec release                      PT Long Term Goals - 03/23/15 1623    PT LONG TERM GOAL #1   Title pt will improve Rt UE ROM to [facility] to improve functional mobility with home tasks   Status On-going   PT LONG TERM GOAL #2   Title Pt will improve Rt UE strength to 4/5 in order to perform housework without pain   Status Achieved   PT LONG TERM GOAL #3   Title Pt will decrease Rt shoulder pain to 1/10 with  grooming activities   Status Partially Met   PT LONG TERM GOAL #4   Title Pt will be able to use Rt UE to brush teeth without increased symptoms   Status Achieved   PT LONG TERM GOAL #5   Title Pt will improve strength functionally and be able to perform overhead chores.   Status New               Plan - 03/23/15 1609    Clinical Impression Statement Pt somewhat guarded with manual therapy today. Pt reported increased pain with exercises today; reduced with use of cryotherapy.    Pt will benefit from skilled therapeutic intervention in order to improve on the following deficits Decreased strength;Decreased range of motion;Pain;Impaired UE functional use   Rehab Potential Good   PT Frequency 2x / week   PT Duration 4 weeks   PT Treatment/Interventions Moist Heat;Cryotherapy;Electrical Stimulation;Patient/family education;Therapeutic activities;Therapeutic exercise;Neuromuscular re-education;Manual techniques;Ultrasound;Other (comment)   PT Next Visit Plan assess end of next week, perform FOTO   Consulted and Agree with Plan of Care Patient        Problem List Patient Active Problem List   Diagnosis Date Noted  . Complete tear of right rotator cuff 12/05/2014  . Wound healing, delayed 05/06/2013  . Candidal skin infection 05/06/2013  . H/O diverticulitis of SIGMOID and left colon s/p laparoscopic assisted left colectomy 04/22/13 02/15/2013  . Diverticulitis of proximal descending colon 02/07/2013  . Hypokalemia 01/26/2013  . Type 2 diabetes mellitus 08/13/2012  . Morbid obesity 08/13/2012  . Insomnia 08/13/2012  . History of ulcerative colitis 08/13/2012  . Musculoskeletal pain 08/13/2012  . Constipation 02/07/2012  . Hyperlipidemia 02/07/2012  . History of migraine headaches 02/07/2012  . Lichen planus 38/75/6433  . Allergic rhinitis 02/07/2012  . Angioedema 02/07/2012  . GERD 02/26/2010  . VITAMIN B12 DEFICIENCY 05/05/2008  . HYPOTHYROIDISM 04/29/2008  .  DEPRESSION 04/29/2008  . HYPERTENSION 04/29/2008  . ASTHMA 04/29/2008  . DIVERTICULOSIS, COLON 12/11/2006  . H/O: hysterectomy 02/15/1998   Kerin Perna, PTA 03/23/2015 4:24 PM   [place], [place], [address] Phone: [phone]   Fax:  [phone]     PHYSICAL THERAPY DISCHARGE SUMMARY  Visits from Start of Care: 16  Current functional level related to goals / functional outcomes: See above. Unable to assess further as patient did not  return for f/u visits.   Remaining deficits: Unknown. See above   Education / Equipment: HEP  Plan: Patient agrees to discharge.  Patient goals were partially met. Patient is being discharged due to the physician's request. Patient phoned to cancel future visits.?????        Madelyn Flavors, PT 04/10/2015 10:07 AM  [facility] [place], [place] [address]  [phone] (office) [phone] (fax)

## 2015-03-26 ENCOUNTER — Encounter: Payer: 59 | Admitting: Physical Therapy

## 2015-04-28 ENCOUNTER — Other Ambulatory Visit: Payer: Self-pay | Admitting: Internal Medicine

## 2015-04-28 NOTE — Telephone Encounter (Signed)
Needs appt. Not CPE just follow up on thyroid

## 2015-05-05 ENCOUNTER — Encounter: Payer: Self-pay | Admitting: *Deleted

## 2015-05-05 ENCOUNTER — Emergency Department (INDEPENDENT_AMBULATORY_CARE_PROVIDER_SITE_OTHER)
Admission: EM | Admit: 2015-05-05 | Discharge: 2015-05-05 | Disposition: A | Discharge status: Home / Self Care | Payer: 59 | Source: Home / Self Care | Attending: Family Medicine | Admitting: Family Medicine

## 2015-05-05 DIAGNOSIS — R300 Dysuria: Secondary | ICD-10-CM | POA: Diagnosis not present

## 2015-05-05 DIAGNOSIS — N3000 Acute cystitis without hematuria: Secondary | ICD-10-CM

## 2015-05-05 LAB — POCT URINALYSIS DIP (MANUAL ENTRY)
Bilirubin, UA: NEGATIVE
Blood, UA: NEGATIVE
GLUCOSE UA: NEGATIVE
Ketones, POC UA: NEGATIVE
NITRITE UA: NEGATIVE
PH UA: 5.5 (ref 5–8)
Protein Ur, POC: 30 — AB
Spec Grav, UA: >=1.03 (ref 1.005–1.03)
UROBILINOGEN UA: 0.2 (ref 0–1)

## 2015-05-05 MED ORDER — CEPHALEXIN 500 MG PO CAPS: 500.0000 mg | ORAL_CAPSULE | Freq: Two times a day (BID) | ORAL | Status: DC

## 2015-05-05 NOTE — ED Notes (Signed)
Pt c/o dysuria, urinary frequency and urgency x 1 wk. Denies fever,

## 2015-05-05 NOTE — ED Provider Notes (Signed)
[name] is a 64 y.o. female who presents to Urgent Care today for a very frequency urgency and dysuria present for one week. No fevers chills nausea vomiting or diarrhea. She has tried concentrated cranberry juice which was incredibly better and did not help. She feels well otherwise. No significant abdominal pain.   Past Medical History  Diagnosis Date  . Asthma   . Diverticulitis   . Diverticulosis   . Thyroid disease     hypo  . Hypertension   . Diabetes mellitus     not on meds.  . Hypothyroidism   . Migraine   . Anemia     none recently  . Depression   . Wears glasses   . GERD (gastroesophageal reflux disease)   . Anxiety   . Complete tear of right rotator cuff 12/05/2014   Past Surgical History  Procedure Laterality Date  . Cesarean section    . Abdominal hysterectomy    . Knee cartilage surgery      Left  . Cataract extraction, bilateral    . Colonoscopy      multiple   . Esophagogastroduodenoscopy endoscopy      multiple  . Right foot surgery  little toe and next toe    x 3  . Laparoscopic partial colectomy N/A 04/22/2013    Procedure: LAPAROSCOPIC PARTIAL COLECTOMY;  Surgeon: Odis Hollingshead, MD;  Location: [facility];  Service: General;  Laterality: N/A;  . Shoulder arthroscopy with rotator cuff repair and subacromial decompression Right 12/05/2014    Procedure: RIGHT SHOULDER ARTHROSCOPY,ACROMOPLASTY, ROTATOR CUFF REPAIR;  Surgeon: [name], MD;  Location: [facility];  Service: Orthopedics;  Laterality: Right;   History  Substance Use Topics  . Smoking status: Former Smoker -- 0.50 packs/day for 6 years    Types: Cigarettes    Quit date: 11/14/1982  . Smokeless tobacco: Never Used     Comment: quit smoking 30 years ago  . Alcohol Use: Yes     Comment:  very rare    ROS as above Medications: No current facility-administered medications for this encounter.   Current Outpatient Prescriptions  Medication Sig Dispense Refill  .  albuterol (PROVENTIL HFA;VENTOLIN HFA) 108 (90 BASE) MCG/ACT inhaler Inhale 2 puffs into the lungs every 6 (six) hours as needed for wheezing. For wheezing 1 Inhaler 11  . ALPRAZolam (XANAX) 0.5 MG tablet TAKE 1 TABLET BY MOUTH TWICE DAILY AS NEEDED 180 tablet 1  . Ascorbic Acid (VITAMIN C) 1000 MG tablet Take 1,000 mg by mouth daily.    Marland Kitchen aspirin 81 MG tablet Take 81 mg by mouth daily.      Marland Kitchen aspirin-acetaminophen-caffeine (EXCEDRIN MIGRAINE) 250-250-65 MG per tablet Take 1 tablet by mouth every 6 (six) hours as needed. For pain/headache    . baclofen (LIORESAL) 10 MG tablet Take 1 tablet (10 mg total) by mouth 3 (three) times daily. As needed for muscle spasm 50 tablet 0  . cephALEXin (KEFLEX) 500 MG capsule Take 1 capsule (500 mg total) by mouth 2 (two) times daily. 14 capsule 0  . cetirizine (ZYRTEC) 10 MG tablet Take 10 mg by mouth at bedtime.     . Cholecalciferol (VITAMIN D) 2000 UNITS tablet Take 2,000 Units by mouth daily.      Marland Kitchen EPINEPHrine (EPI-PEN) 0.3 mg/0.3 mL SOAJ injection Inject 0.3 mLs (0.3 mg total) into the muscle once. 1 Device prn  . escitalopram (LEXAPRO) 10 MG tablet Take 1 tablet (10 mg total) by  mouth daily. (Patient taking differently: Take 10 mg by mouth at bedtime. ) 90 tablet 3  . esomeprazole (NEXIUM) 40 MG capsule TAKE 1 CAPSULE BY MOUTH TWICE DAILY 180 capsule 3  . famotidine (PEPCID) 20 MG tablet Take 20 mg by mouth at bedtime.      . folic acid (FOLVITE) 1 MG tablet Take 1 mg by mouth daily.    . furosemide (LASIX) 40 MG tablet Take 1 tablet (40 mg total) by mouth daily. 180 tablet 3  . levothyroxine (SYNTHROID, LEVOTHROID) 200 MCG tablet TAKE 1 TABLET (200 MCG TOTAL) BY MOUTH DAILY BEFORE BREAKFAST. 30 tablet 0  . losartan (COZAAR) 50 MG tablet TAKE 1 TABLET BY MOUTH ONCE DAILY FOR HYPERTENSION 90 tablet PRN  . metoprolol tartrate (LOPRESSOR) 25 MG tablet Take 1 tablet (25 mg total) by mouth every morning. 30 tablet 0  . ondansetron (ZOFRAN) 4 MG tablet Take 1  tablet (4 mg total) by mouth every 8 (eight) hours as needed for nausea or vomiting. 30 tablet 0  . OSCIMIN SR 0.375 MG 12 hr tablet TAKE 1 TABLET BY MOUTH TWICE DAILY 180 tablet PRN  . oxyCODONE-acetaminophen (PERCOCET) 10-325 MG per tablet Take 1-2 tablets by mouth every 6 (six) hours as needed for pain. MAXIMUM TOTAL ACETAMINOPHEN DOSE IS 4000 MG PER DAY 75 tablet 0  . pyridOXINE (VITAMIN B-6) 50 MG tablet Take 50 mg by mouth daily.    . sennosides-docusate sodium (SENOKOT-S) 8.6-50 MG tablet Take 2 tablets by mouth daily. (Patient not taking: Reported on 01/12/2015) 30 tablet 1  . simvastatin (ZOCOR) 10 MG tablet Take 10 mg by mouth at bedtime.    . traZODone (DESYREL) 150 MG tablet TAKE 1 TABLET BY MOUTH AT BEDTIME 90 tablet PRN  . vitamin A 10000 UNIT capsule Take 10,000 Units by mouth daily.    . vitamin B-12 (CYANOCOBALAMIN) 100 MCG tablet Take 100 mcg by mouth every morning.     . zinc gluconate 50 MG tablet Take 50 mg by mouth daily.    Marland Kitchen zolpidem (AMBIEN) 10 MG tablet TAKE 1 TABLET BY MOUTH ONCE DAILY AT BEDTIME 90 tablet 1  . ZYFLO CR 600 MG CR tablet TAKE 2 TABLETS BY MOUTH TWICE DAILY 360 each 3   Allergies  Allergen Reactions  . Levaquin [Levofloxacin] Anaphylaxis    Possibly Levaquin or Lisinopril, pt was taking both at the time of reaction  . Lisinopril Anaphylaxis    Possibly Lisinopril or Levaquin, pt was taking both at the time of reaction  . Bee Venom Swelling  . Morphine And Related Itching    Needs benadryl prior to      Exam:  BP 171/89 mmHg  Pulse 73  Temp(Src) 97.7 F (36.5 C) (Oral)  Resp 18  Ht 5\' 5"  (1.651 m)  Wt 315 lb (142.883 kg)  BMI 52.42 kg/m2  SpO2 95% Gen: Well NAD morbidly obese HEENT: EOMI,  MMM Lungs: Normal work of breathing. CTABL Heart: RRR no MRG Abd: NABS, Soft. Nondistended, Nontender no CV angle tenderness to percussion Exts: Brisk capillary refill, warm and well perfused.   Results for orders placed or performed during the  hospital encounter of 05/05/15 (from the past 24 hour(s))  POCT urinalysis dipstick (new)     Status: Abnormal   Collection Time: 05/05/15  8:37 AM  Result Value Ref Range   Color, UA yellow yellow   Clarity, UA cloudy (A) clear   Glucose, UA negative negative   Bilirubin, UA negative negative  Bilirubin, UA negative negative   Spec Grav, UA >=1.030 1.005 - 1.03   Blood, UA negative negative   pH, UA 5.5 5 - 8   Protein Ur, POC =30 (A) negative   Urobilinogen, UA 0.2 0 - 1   Nitrite, UA Negative Negative   Leukocytes, UA moderate (2+) (A) Negative   No results found.  Assessment and Plan: 64 y.o. female with urinary tract infection. Culture pending. Treat with Keflex.  Discussed warning signs or symptoms. Please see discharge instructions. Patient expresses understanding.     Gregor Hams, MD 05/05/15 [phone]

## 2015-05-05 NOTE — Discharge Instructions (Signed)
Thank you for coming in today. If your belly pain worsens, or you have high fever, bad vomiting, blood in your stool or black tarry stool go to the Emergency Room.   Urinary Tract Infection Urinary tract infections (UTIs) can develop anywhere along your urinary tract. Your urinary tract is your body's drainage system for removing wastes and extra water. Your urinary tract includes two kidneys, two ureters, a bladder, and a urethra. Your kidneys are a pair of bean-shaped organs. Each kidney is about the size of your fist. They are located below your ribs, one on each side of your spine. CAUSES Infections are caused by microbes, which are microscopic organisms, including fungi, viruses, and bacteria. These organisms are so small that they can only be seen through a microscope. Bacteria are the microbes that most commonly cause UTIs. SYMPTOMS  Symptoms of UTIs may vary by age and gender of the patient and by the location of the infection. Symptoms in young women typically include a frequent and intense urge to urinate and a painful, burning feeling in the bladder or urethra during urination. Older women and men are more likely to be tired, shaky, and weak and have muscle aches and abdominal pain. A fever may mean the infection is in your kidneys. Other symptoms of a kidney infection include pain in your back or sides below the ribs, nausea, and vomiting. DIAGNOSIS To diagnose a UTI, your caregiver will ask you about your symptoms. Your caregiver also will ask to provide a urine sample. The urine sample will be tested for bacteria and white blood cells. White blood cells are made by your body to help fight infection. TREATMENT  Typically, UTIs can be treated with medication. Because most UTIs are caused by a bacterial infection, they usually can be treated with the use of antibiotics. The choice of antibiotic and length of treatment depend on your symptoms and the type of bacteria causing your  infection. HOME CARE INSTRUCTIONS  If you were prescribed antibiotics, take them exactly as your caregiver instructs you. Finish the medication even if you feel better after you have only taken some of the medication.  Drink enough water and fluids to keep your urine clear or pale yellow.  Avoid caffeine, tea, and carbonated beverages. They tend to irritate your bladder.  Empty your bladder often. Avoid holding urine for long periods of time.  Empty your bladder before and after sexual intercourse.  After a bowel movement, women should cleanse from front to back. Use each tissue only once. SEEK MEDICAL CARE IF:   You have back pain.  You develop a fever.  Your symptoms do not begin to resolve within 3 days. SEEK IMMEDIATE MEDICAL CARE IF:   You have severe back pain or lower abdominal pain.  You develop chills.  You have nausea or vomiting.  You have continued burning or discomfort with urination. MAKE SURE YOU:   Understand these instructions.  Will watch your condition.  Will get help right away if you are not doing well or get worse. Document Released: 08/10/2005 Document Revised: 05/01/2012 Document Reviewed: 12/09/2011 Exit[employer] Patient Information 2015 [employer]. This information is not intended to replace advice given to you by your health care provider. Make sure you discuss any questions you have with your health care provider.  

## 2015-05-07 LAB — URINE CULTURE: Colony Count: 50000

## 2015-05-08 ENCOUNTER — Telehealth: Payer: Self-pay | Admitting: Emergency Medicine

## 2015-05-08 NOTE — ED Notes (Signed)
Inquired about patient's status; encourage them to call with questions/concerns.  

## 2015-05-11 ENCOUNTER — Other Ambulatory Visit: Payer: Self-pay

## 2015-05-12 ENCOUNTER — Other Ambulatory Visit: Payer: Self-pay | Admitting: Internal Medicine

## 2015-05-12 ENCOUNTER — Encounter: Payer: Self-pay | Admitting: Internal Medicine

## 2015-05-12 ENCOUNTER — Ambulatory Visit (INDEPENDENT_AMBULATORY_CARE_PROVIDER_SITE_OTHER): Payer: 59 | Admitting: Internal Medicine

## 2015-05-12 VITALS — BP 148/82 | HR 65 | Temp 98.4°F | Wt 312.0 lb

## 2015-05-12 DIAGNOSIS — Z0000 Encounter for general adult medical examination without abnormal findings: Secondary | ICD-10-CM

## 2015-05-12 DIAGNOSIS — E039 Hypothyroidism, unspecified: Secondary | ICD-10-CM | POA: Diagnosis not present

## 2015-05-12 DIAGNOSIS — Z8659 Personal history of other mental and behavioral disorders: Secondary | ICD-10-CM | POA: Diagnosis not present

## 2015-05-12 DIAGNOSIS — I10 Essential (primary) hypertension: Secondary | ICD-10-CM | POA: Diagnosis not present

## 2015-05-12 DIAGNOSIS — K219 Gastro-esophageal reflux disease without esophagitis: Secondary | ICD-10-CM

## 2015-05-12 DIAGNOSIS — N390 Urinary tract infection, site not specified: Secondary | ICD-10-CM | POA: Diagnosis not present

## 2015-05-12 DIAGNOSIS — E785 Hyperlipidemia, unspecified: Secondary | ICD-10-CM

## 2015-05-12 DIAGNOSIS — Z8719 Personal history of other diseases of the digestive system: Secondary | ICD-10-CM | POA: Diagnosis not present

## 2015-05-12 DIAGNOSIS — E119 Type 2 diabetes mellitus without complications: Secondary | ICD-10-CM | POA: Diagnosis not present

## 2015-05-12 LAB — COMPREHENSIVE METABOLIC PANEL
ALT: 12 U/L (ref 0–35)
AST: 16 U/L (ref 0–37)
Albumin: 3.5 g/dL (ref 3.5–5.2)
Alkaline Phosphatase: 73 U/L (ref 39–117)
BILIRUBIN TOTAL: 0.3 mg/dL (ref 0.2–1.2)
BUN: 15 mg/dL (ref 6–23)
CALCIUM: 8.8 mg/dL (ref 8.4–10.5)
CO2: 28 meq/L (ref 19–32)
Chloride: 103 mEq/L (ref 96–112)
Creat: 0.85 mg/dL (ref 0.50–1.10)
GLUCOSE: 113 mg/dL — AB (ref 70–99)
Potassium: 4.4 mEq/L (ref 3.5–5.3)
SODIUM: 141 meq/L (ref 135–145)
TOTAL PROTEIN: 6.3 g/dL (ref 6.0–8.3)

## 2015-05-12 LAB — CBC WITH DIFFERENTIAL/PLATELET
BASOS PCT: 1 % (ref 0–1)
Basophils Absolute: 0.1 10*3/uL (ref 0.0–0.1)
Eosinophils Absolute: 0.5 10*3/uL (ref 0.0–0.7)
Eosinophils Relative: 5 % (ref 0–5)
HCT: 36.3 % (ref 36.0–46.0)
HEMOGLOBIN: 11.2 g/dL — AB (ref 12.0–15.0)
Lymphocytes Relative: 41 % (ref 12–46)
Lymphs Abs: 4.1 10*3/uL — ABNORMAL HIGH (ref 0.7–4.0)
MCH: 24.6 pg — ABNORMAL LOW (ref 26.0–34.0)
MCHC: 30.9 g/dL (ref 30.0–36.0)
MCV: 79.6 fL (ref 78.0–100.0)
MPV: 10.3 fL (ref 8.6–12.4)
Monocytes Absolute: 0.8 10*3/uL (ref 0.1–1.0)
Monocytes Relative: 8 % (ref 3–12)
NEUTROS ABS: 4.5 10*3/uL (ref 1.7–7.7)
Neutrophils Relative %: 45 % (ref 43–77)
Platelets: 358 10*3/uL (ref 150–400)
RBC: 4.56 MIL/uL (ref 3.87–5.11)
RDW: 15.2 % (ref 11.5–15.5)
WBC: 10 10*3/uL (ref 4.0–10.5)

## 2015-05-12 LAB — LIPID PANEL
CHOL/HDL RATIO: 4.4 ratio
CHOLESTEROL: 164 mg/dL (ref 0–200)
HDL: 37 mg/dL — ABNORMAL LOW (ref >=46)
LDL Cholesterol: 100 mg/dL — ABNORMAL HIGH (ref 0–99)
Triglycerides: 133 mg/dL (ref <150)
VLDL: 27 mg/dL (ref 0–40)

## 2015-05-12 LAB — TSH: TSH: 2.906 u[IU]/mL (ref 0.350–4.500)

## 2015-05-12 LAB — HEMOGLOBIN A1C
HEMOGLOBIN A1C: 6.3 % — AB (ref <5.7)
Mean Plasma Glucose: 134 mg/dL — ABNORMAL HIGH (ref <117)

## 2015-05-12 NOTE — Progress Notes (Signed)
   Subjective:    Patient ID: [name], female    DOB: [birth year], 64 y.o.   MRN: [id number]  HPI [name] hasn't been seen here in some time. She is now 64 years old. She has had many medical issues over the years and has had right shoulder surgery.  She had right shoulder arthroscopy, acromioplasty and rotator cuff repair January 2016 by Dr. 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 Has been enjoying her time off. Feels a bit fatigued.   Long-standing history of obesity, essential hypertension, hypothyroidism , GE reflux, depression , status post hysterectomy, history of acute diverticulitis requiring sigmoid colectomy in 2014.   Was seen at urgent care June 21 treated by Dr. Georgina Snell for urinary tract infection with Keflex. Culture grew 50,000 colonies per milliliter of Escherichia coli sensitive to Keflex. She's feeling better. Has never had previous UTI.  Review of Systems     Objective:   Physical Exam  Skin warm and dry. Nodes none. Neck is supple without JVD thyromegaly or carotid bruits. Chest clear to auscultation. Cardiac exam regular rate and rhythm normal S1 and S2. Extremities without edema. Fasting labs are drawn and are pending.       Assessment & Plan:   Essential hypertension- stable  Status post right rotator cuff repair    Hypothyroidism- TSH pending. Is on thyroid replacement therapy  GE reflux- remains on PPI  History of depression- remains on medication  Status post hysterectomy  Status post acute diverticulitis requiring sigmoid colectomy    morbid obesity  Plan: Fasting labs are pending and will be reviewed. Return in 6 months for physical examination.

## 2015-05-12 NOTE — Patient Instructions (Signed)
Labs are pending  And will be reviewed with further recommendations to follow. Return in 6 months for physical examination.

## 2015-05-13 ENCOUNTER — Telehealth: Payer: Self-pay | Admitting: *Deleted

## 2015-05-13 LAB — IRON AND TIBC
%SAT: 9 % — ABNORMAL LOW (ref 20–55)
IRON: 33 ug/dL — AB (ref 42–145)
TIBC: 379 ug/dL (ref 250–470)
UIBC: 346 ug/dL (ref 125–400)

## 2015-05-13 LAB — VITAMIN B12: Vitamin B-12: 720 pg/mL (ref 211–911)

## 2015-05-13 NOTE — Telephone Encounter (Signed)
Reviewed lab results with patient.

## 2015-05-14 ENCOUNTER — Telehealth: Payer: Self-pay | Admitting: *Deleted

## 2015-05-14 MED ORDER — FERROUS SULFATE 325 (65 FE) MG PO TABS: 325.0000 mg | ORAL_TABLET | Freq: Two times a day (BID) | ORAL | Status: DC

## 2015-05-14 MED ORDER — FERROUS SULFATE 325 (65 FE) MG PO TABS: 325.0000 mg | ORAL_TABLET | Freq: Every day | ORAL | Status: DC

## 2015-05-14 NOTE — Telephone Encounter (Signed)
Reviewed lab results and instructions with patient. Script sent in for iron suppliment. appt scheduled for repeat labs.

## 2015-05-19 ENCOUNTER — Other Ambulatory Visit: Payer: Self-pay | Admitting: Internal Medicine

## 2015-05-19 NOTE — Telephone Encounter (Signed)
Please handle

## 2015-05-19 NOTE — Telephone Encounter (Signed)
Please refill x 6 months 

## 2015-06-08 ENCOUNTER — Other Ambulatory Visit: Payer: 59 | Admitting: Internal Medicine

## 2015-06-08 DIAGNOSIS — R7989 Other specified abnormal findings of blood chemistry: Secondary | ICD-10-CM

## 2015-06-08 DIAGNOSIS — R790 Abnormal level of blood mineral: Secondary | ICD-10-CM

## 2015-06-08 LAB — CBC WITH DIFFERENTIAL/PLATELET
BASOS PCT: 1 % (ref 0–1)
Basophils Absolute: 0.1 10*3/uL (ref 0.0–0.1)
EOS ABS: 0.4 10*3/uL (ref 0.0–0.7)
Eosinophils Relative: 4 % (ref 0–5)
HCT: 34.2 % — ABNORMAL LOW (ref 36.0–46.0)
HEMOGLOBIN: 10.7 g/dL — AB (ref 12.0–15.0)
Lymphocytes Relative: 36 % (ref 12–46)
Lymphs Abs: 3.7 10*3/uL (ref 0.7–4.0)
MCH: 25.2 pg — AB (ref 26.0–34.0)
MCHC: 31.3 g/dL (ref 30.0–36.0)
MCV: 80.7 fL (ref 78.0–100.0)
MPV: 9.8 fL (ref 8.6–12.4)
Monocytes Absolute: 0.9 10*3/uL (ref 0.1–1.0)
Monocytes Relative: 9 % (ref 3–12)
NEUTROS ABS: 5.2 10*3/uL (ref 1.7–7.7)
NEUTROS PCT: 50 % (ref 43–77)
Platelets: 285 10*3/uL (ref 150–400)
RBC: 4.24 MIL/uL (ref 3.87–5.11)
RDW: 16.3 % — ABNORMAL HIGH (ref 11.5–15.5)
WBC: 10.4 10*3/uL (ref 4.0–10.5)

## 2015-06-08 LAB — IRON AND TIBC
%SAT: 7 % — ABNORMAL LOW (ref 20–55)
Iron: 30 ug/dL — ABNORMAL LOW (ref 42–145)
TIBC: 451 ug/dL (ref 250–470)
UIBC: 421 ug/dL — ABNORMAL HIGH (ref 125–400)

## 2015-06-09 ENCOUNTER — Telehealth: Payer: Self-pay | Admitting: *Deleted

## 2015-06-09 NOTE — Telephone Encounter (Signed)
Instructed patient to take iron tablets twice daily will recheck labs in 8 weeks

## 2015-06-09 NOTE — Telephone Encounter (Signed)
Spoke with patient regarding low iron count she states she is taking iron tablet OTC once daily at this time.

## 2015-08-25 ENCOUNTER — Other Ambulatory Visit: Payer: Self-pay | Admitting: *Deleted

## 2015-08-25 ENCOUNTER — Other Ambulatory Visit: Payer: Self-pay | Admitting: Internal Medicine

## 2015-08-25 MED ORDER — TRAZODONE HCL 150 MG PO TABS
150.0000 mg | ORAL_TABLET | Freq: Every day | ORAL | Status: DC
Start: 2015-08-25 — End: 2015-11-20

## 2015-08-25 MED ORDER — HYOSCYAMINE SULFATE ER 0.375 MG PO TB12: 0.3750 mg | ORAL_TABLET | Freq: Two times a day (BID) | ORAL | Status: DC

## 2015-08-25 NOTE — Telephone Encounter (Signed)
Please refill for 3 months  

## 2015-09-11 ENCOUNTER — Telehealth: Payer: Self-pay | Admitting: Internal Medicine

## 2015-09-11 NOTE — Telephone Encounter (Signed)
Patient had called for appointment for R knee pain.  Dr. Renold Genta had left a note that patient should be referred to Ortho.  Patient has been previously established with Dr. [name] office.  Spoke with their office and appointment given for Monday, 10/31 @ 2:30.  Patient to arrive at  2:15.    Faxed documentation to [phone].  Patient notified of appointment date/time.  Patient given Dr. [name] phone # in the event she needs to cancel or re-schedule appointment.  Patient is aware of office address/location.

## 2015-09-17 ENCOUNTER — Ambulatory Visit: Payer: 59 | Admitting: Internal Medicine

## 2015-10-19 ENCOUNTER — Ambulatory Visit (INDEPENDENT_AMBULATORY_CARE_PROVIDER_SITE_OTHER): Payer: 59 | Admitting: Internal Medicine

## 2015-10-19 ENCOUNTER — Encounter: Payer: Self-pay | Admitting: Internal Medicine

## 2015-10-19 VITALS — BP 172/98 | HR 80 | Temp 98.2°F | Resp 24 | Ht 65.0 in | Wt 300.0 lb

## 2015-10-19 DIAGNOSIS — Z8719 Personal history of other diseases of the digestive system: Secondary | ICD-10-CM | POA: Diagnosis not present

## 2015-10-19 DIAGNOSIS — I10 Essential (primary) hypertension: Secondary | ICD-10-CM | POA: Diagnosis not present

## 2015-10-19 DIAGNOSIS — Z23 Encounter for immunization: Secondary | ICD-10-CM

## 2015-10-19 DIAGNOSIS — Z8709 Personal history of other diseases of the respiratory system: Secondary | ICD-10-CM

## 2015-10-19 DIAGNOSIS — Z9049 Acquired absence of other specified parts of digestive tract: Secondary | ICD-10-CM

## 2015-10-19 DIAGNOSIS — R609 Edema, unspecified: Secondary | ICD-10-CM | POA: Diagnosis not present

## 2015-10-19 DIAGNOSIS — E119 Type 2 diabetes mellitus without complications: Secondary | ICD-10-CM | POA: Diagnosis not present

## 2015-10-19 DIAGNOSIS — M791 Myalgia: Secondary | ICD-10-CM

## 2015-10-19 DIAGNOSIS — Z0000 Encounter for general adult medical examination without abnormal findings: Secondary | ICD-10-CM | POA: Diagnosis not present

## 2015-10-19 DIAGNOSIS — J309 Allergic rhinitis, unspecified: Secondary | ICD-10-CM | POA: Diagnosis not present

## 2015-10-19 DIAGNOSIS — F32A Depression, unspecified: Secondary | ICD-10-CM

## 2015-10-19 DIAGNOSIS — Z1321 Encounter for screening for nutritional disorder: Secondary | ICD-10-CM | POA: Diagnosis not present

## 2015-10-19 DIAGNOSIS — L439 Lichen planus, unspecified: Secondary | ICD-10-CM

## 2015-10-19 DIAGNOSIS — F329 Major depressive disorder, single episode, unspecified: Secondary | ICD-10-CM

## 2015-10-19 DIAGNOSIS — E785 Hyperlipidemia, unspecified: Secondary | ICD-10-CM

## 2015-10-19 DIAGNOSIS — Z130 Encounter for screening for diseases of the blood and blood-forming organs and certain disorders involving the immune mechanism: Secondary | ICD-10-CM | POA: Diagnosis not present

## 2015-10-19 DIAGNOSIS — Z1329 Encounter for screening for other suspected endocrine disorder: Secondary | ICD-10-CM

## 2015-10-19 DIAGNOSIS — M7918 Myalgia, other site: Secondary | ICD-10-CM

## 2015-10-19 DIAGNOSIS — G4700 Insomnia, unspecified: Secondary | ICD-10-CM

## 2015-10-19 DIAGNOSIS — E559 Vitamin D deficiency, unspecified: Secondary | ICD-10-CM | POA: Diagnosis not present

## 2015-10-19 DIAGNOSIS — E039 Hypothyroidism, unspecified: Secondary | ICD-10-CM

## 2015-10-19 DIAGNOSIS — Z8669 Personal history of other diseases of the nervous system and sense organs: Secondary | ICD-10-CM

## 2015-10-19 DIAGNOSIS — E538 Deficiency of other specified B group vitamins: Secondary | ICD-10-CM

## 2015-10-19 LAB — POCT URINALYSIS DIPSTICK
BILIRUBIN UA: NEGATIVE
Blood, UA: NEGATIVE
GLUCOSE UA: NEGATIVE
Ketones, UA: NEGATIVE
Leukocytes, UA: NEGATIVE
Nitrite, UA: NEGATIVE
Protein, UA: NEGATIVE
Spec Grav, UA: >=1.03
Urobilinogen, UA: 0.2
pH, UA: 6

## 2015-10-19 LAB — CBC WITH DIFFERENTIAL/PLATELET
Basophils Absolute: 0.1 10*3/uL (ref 0.0–0.1)
Basophils Relative: 1 % (ref 0–1)
EOS ABS: 0.5 10*3/uL (ref 0.0–0.7)
EOS PCT: 6 % — AB (ref 0–5)
HCT: 40.2 % (ref 36.0–46.0)
Hemoglobin: 12.9 g/dL (ref 12.0–15.0)
LYMPHS ABS: 3.4 10*3/uL (ref 0.7–4.0)
LYMPHS PCT: 41 % (ref 12–46)
MCH: 27.6 pg (ref 26.0–34.0)
MCHC: 32.1 g/dL (ref 30.0–36.0)
MCV: 86.1 fL (ref 78.0–100.0)
MONOS PCT: 9 % (ref 3–12)
MPV: 10.6 fL (ref 8.6–12.4)
Monocytes Absolute: 0.8 10*3/uL (ref 0.1–1.0)
NEUTROS PCT: 43 % (ref 43–77)
Neutro Abs: 3.6 10*3/uL (ref 1.7–7.7)
PLATELETS: 287 10*3/uL (ref 150–400)
RBC: 4.67 MIL/uL (ref 3.87–5.11)
RDW: 14.6 % (ref 11.5–15.5)
WBC: 8.4 10*3/uL (ref 4.0–10.5)

## 2015-10-19 LAB — COMPLETE METABOLIC PANEL WITH GFR
ALT: 16 U/L (ref 6–29)
AST: 16 U/L (ref 10–35)
Albumin: 3.8 g/dL (ref 3.6–5.1)
Alkaline Phosphatase: 70 U/L (ref 33–130)
BUN: 16 mg/dL (ref 7–25)
CO2: 29 mmol/L (ref 20–31)
CREATININE: 0.88 mg/dL (ref 0.50–0.99)
Calcium: 9 mg/dL (ref 8.6–10.4)
Chloride: 103 mmol/L (ref 98–110)
GFR, Est African American: 81 mL/min (ref >=60)
GFR, Est Non African American: 70 mL/min (ref >=60)
Glucose, Bld: 114 mg/dL — ABNORMAL HIGH (ref 65–99)
Potassium: 4.3 mmol/L (ref 3.5–5.3)
SODIUM: 142 mmol/L (ref 135–146)
Total Bilirubin: 0.4 mg/dL (ref 0.2–1.2)
Total Protein: 6.6 g/dL (ref 6.1–8.1)

## 2015-10-19 LAB — LIPID PANEL
CHOL/HDL RATIO: 4.3 ratio (ref <=5.0)
Cholesterol: 181 mg/dL (ref 125–200)
HDL: 42 mg/dL — AB (ref >=46)
LDL Cholesterol: 115 mg/dL (ref <130)
Triglycerides: 121 mg/dL (ref <150)
VLDL: 24 mg/dL (ref <30)

## 2015-10-19 LAB — HEMOGLOBIN A1C
Hgb A1c MFr Bld: 6.2 % — ABNORMAL HIGH (ref <5.7)
Mean Plasma Glucose: 131 mg/dL — ABNORMAL HIGH (ref <117)

## 2015-10-19 LAB — TSH: TSH: 2.059 u[IU]/mL (ref 0.350–4.500)

## 2015-10-20 LAB — VITAMIN D 25 HYDROXY (VIT D DEFICIENCY, FRACTURES): Vit D, 25-Hydroxy: 26 ng/mL — ABNORMAL LOW (ref 30–100)

## 2015-11-10 ENCOUNTER — Ambulatory Visit (INDEPENDENT_AMBULATORY_CARE_PROVIDER_SITE_OTHER): Payer: 59 | Admitting: Internal Medicine

## 2015-11-10 ENCOUNTER — Encounter: Payer: Self-pay | Admitting: Internal Medicine

## 2015-11-10 VITALS — BP 110/70 | HR 65 | Temp 98.3°F | Resp 18 | Ht 65.0 in | Wt 300.0 lb

## 2015-11-10 DIAGNOSIS — E039 Hypothyroidism, unspecified: Secondary | ICD-10-CM | POA: Diagnosis not present

## 2015-11-10 DIAGNOSIS — E785 Hyperlipidemia, unspecified: Secondary | ICD-10-CM

## 2015-11-10 DIAGNOSIS — I10 Essential (primary) hypertension: Secondary | ICD-10-CM

## 2015-11-10 DIAGNOSIS — R609 Edema, unspecified: Secondary | ICD-10-CM

## 2015-11-10 MED ORDER — MELOXICAM 15 MG PO TABS: 15.0000 mg | ORAL_TABLET | Freq: Every day | ORAL | Status: DC

## 2015-11-10 NOTE — Patient Instructions (Signed)
Continue Lasix as previously prescribed. Blood pressure now normal. Return in 6 months or as needed.

## 2015-11-10 NOTE — Progress Notes (Signed)
   Subjective:    Patient ID: [name], female    DOB: 1951/01/11, 64 y.o.   MRN: [id number]  HPI 64 year old Female with history of essential hypertension in today to follow-up on elevated blood pressure at last visit. Blood pressure is excellent today. She's been watching it at home and says it's been fine. At last visit, she had been off Lasix. She restarted it. I believe that has made her blood pressure improved.  No new complaints or problems.    Review of Systems     Objective:   Physical Exam Neck supple. Chest clear. Cardiac exam regular rate and rhythm. Extremities without edema.       Assessment & Plan:  Essential hypertension  Dependent edema  Plan: Continue medications as previously prescribed and return in 6 months at which time she'll need office visit, TSH, lipid panel, basic metabolic panel, liver functions.

## 2015-11-20 ENCOUNTER — Other Ambulatory Visit: Payer: Self-pay | Admitting: Internal Medicine

## 2015-11-20 MED FILL — FUROSEMIDE 40 MG TABLET: 40 | 90 days supply | Qty: 90 | Fill #3

## 2015-11-20 MED FILL — LOSARTAN POTASSIUM 50 MG TA: 50 | 90 days supply | Qty: 90 | Fill #0

## 2015-11-20 MED FILL — traZODone HCL 150 MG TABS: 150 | 90 days supply | Qty: 90 | Fill #0

## 2015-11-20 MED FILL — ZYFLO CR 600 MG TABLET: 600 | 90 days supply | Qty: 360 | Fill #0

## 2015-11-20 MED FILL — ESCITALOPRAM 10 MG TABLET: 10 | 90 days supply | Qty: 90 | Fill #0

## 2015-11-20 MED FILL — VENTOLIN HFA 90 MCG INHALER: 108 (90 BAS | 25 days supply | Qty: 18 | Fill #0

## 2015-11-20 MED FILL — OSCIMIN SR 0.375 MG TABLET: 0.375 | 90 days supply | Qty: 180 | Fill #0

## 2015-11-20 MED FILL — LEVOTHYROXINE 200 MCG TAB: 200 | 90 days supply | Qty: 90 | Fill #0

## 2015-11-20 MED FILL — METOPROLOL TARTRATE 25 MG T: 25 | 90 days supply | Qty: 90 | Fill #0

## 2015-11-20 MED FILL — ESOMEPRAZOLE MAG DR 40 MG C: 40 | 90 days supply | Qty: 180 | Fill #0

## 2015-11-20 NOTE — Telephone Encounter (Signed)
All of these listed were needed. Refills given based on refills of last ones filled.

## 2015-11-20 NOTE — Telephone Encounter (Signed)
It is not clear to me what needs to be refilled. Have refilled several already past 2 weeks

## 2015-11-25 ENCOUNTER — Other Ambulatory Visit: Payer: Self-pay

## 2015-11-25 MED ORDER — ZOLPIDEM TARTRATE 10 MG PO TABS: 10.0000 mg | ORAL_TABLET | Freq: Every evening | ORAL | Status: DC | PRN

## 2015-11-25 MED FILL — ZOLPIDEM TARTRATE 10 MG TAB: 10 | 90 days supply | Qty: 90 | Fill #0

## 2015-11-25 MED FILL — MELOXICAM 15 MG TABLET: 15 | 30 days supply | Qty: 30 | Fill #0

## 2015-12-24 ENCOUNTER — Telehealth: Payer: Self-pay

## 2015-12-24 NOTE — Telephone Encounter (Signed)
Per Richardson Landry at [facility] the xyflo CR 600mg  that patient is taking and has been for a long time is very expensive and now cost $10,000.14 for a 3 month supply. Generic singulair is $9.95. He states that he relizes the MOA for zyfloe is different thean the montelukast , but the efficacy is similar. I attempted to contact patient on cell phone, no answer and I left message for return call.

## 2015-12-25 NOTE — Telephone Encounter (Signed)
Left message for return call.

## 2015-12-29 MED ORDER — MONTELUKAST SODIUM 10 MG PO TABS: 10.0000 mg | ORAL_TABLET | Freq: Every day | ORAL | Status: DC

## 2015-12-29 MED FILL — MONTELUKAST SOD 10 MG TAB: 10 | 90 days supply | Qty: 90 | Fill #0

## 2015-12-30 MED FILL — MELOXICAM 15 MG TABLET: 15 | 30 days supply | Qty: 30 | Fill #1

## 2015-12-30 NOTE — Telephone Encounter (Signed)
Patient is in agreement with trying montelukast. Richardson Landry at the pharmacy notified, new script sent to pharmacy. Patient advised.

## 2016-01-10 NOTE — Progress Notes (Signed)
Subjective:    Patient ID: [name], female    DOB: 13-Feb-1951, 65 y.o.   MRN: [id number]  HPI  She is now 65 years of age and in today for health maintenance exam. She has multiple medical issues. 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 Blood pressure is elevated at 172/98. She weighs 300 pounds. History of right knee osteoarthritis. She will try meloxicam. History of GE reflux, depression, essential hypertension , morbid obesity, allergic rhinitis, controlled type 2 diabetes mellitus, insomnia, ulcerative colitis, hypokalemia, diverticulitis of the proximal descending colon, she had laparoscopic assisted left colectomy in June 2014. History of angioedema. History of migraine headaches. History of asthma. History of B-12 deficiency. History of right shoulder arthroscopy, acromioplasty and rotator cuff repair January 2016 by Dr. Mardelle Matte. Recovery from the surgery took a long time and she decided to retire.    Past medical history: Fractured left radial head 1995, epicondylitis right elbow in the past , strep throat January 2005, right  Upper lobe pneumonia March 2011 , C-section 1977, abdominal hysterectomy and right salpingectomy January 1998, cataract extraction right eye September 2006 and left eye September 2006. These were done 2 weeks apart. Repair of left knee medial meniscus June 2008.   Has had Pneumovax immunization June 2000. Up until retirement was getting annual flu vaccine through employment. Had TD vaccine through employment a few years ago.   Patient was admitted December 2007 with syncope. Had herpes zoster July 2007. Hospitalized with probable viral gastroenteritis April 2011. History of angioedema of the face February 2011. Surgery for chronic sinusitis November 2010. History of gallstones and hepatic steatosis. History of depression and allergic rhinitis. History of by deficiency anemia that resolved after hysterectomy. History of noncardiac and  nonpulmonary chest pain 2011. Had negative cardiac enzymes, negative EKG, negative CT angiography although her d-dimer was elevated.  Colonoscopy June 2009 showed severe colitis in the left colon. Patient previously had had a panendoscopy January 2008 at which time some nodules were seen in the terminal ileum that were thought to be possible inflammatory bowel disease or from aspirin or anti-inflammatory medications. She had diverticulosis and small hemorrhoids. Pathology done from biopsy June 2009 revealed ulcerative colonic mucosa without granulomas. Differential included ischemic related ulceration.   She has history of positive allergy skin tests to dust mite and mold in 2009 periods from a tree showed an FVC of 2.61 which was  81% of predicted , FEV1 was 1.96 which was 75% of predicted. Following administration of inhaled albuterol FEV1 rose to 93% of predicted.     Father deceased with history of stroke, hypertension, diabetes and COPD. Mother deceased with history of congestive heart failure and chronic kidney disease. One sister and one son in good health.   Social history: She is divorced. Does not smoke. Occasional alcohol consumption. Long-standing history of morbid obesity. Weight in 1996 was 307 pounds, weight in November 2001 was 282 pounds. Weight in 2007 was 289 pounds. Weight in January 2011 was 290 pounds. Weight in January 2012 was 294 pounds.   She has been a patient in this office since September 1996.      Review of Systems  Fatigue, musculoskeletal pain, dependent edema, history of depression     Objective:   Physical Exam  Constitutional: She appears well-developed and well-nourished. No distress.  HENT:  Head: Normocephalic and atraumatic.  Right Ear: External ear normal.  Left Ear: External ear normal.  Eyes: Conjunctivae and  EOM are normal. Pupils are equal, round, and reactive to light. Left eye exhibits no discharge. No scleral icterus.  Neck: Neck supple. No  JVD present. No thyromegaly present.  Cardiovascular: Normal rate, regular rhythm, normal heart sounds and intact distal pulses.   No murmur heard. Pulmonary/Chest: Effort normal. She has no wheezes.  Genitourinary:  History of TAH, RSO 1998.  Musculoskeletal: She exhibits edema.  Lymphadenopathy:    She has no cervical adenopathy (Hx of depression).  Neurological: She is alert. She has normal reflexes. No cranial nerve deficit. Coordination normal.  Skin: Skin is dry. She is not diaphoretic.  Psychiatric: She has a normal mood and affect. Her behavior is normal. Judgment and thought content normal.  Vitals reviewed.          Assessment & Plan:   Essential HTN- BP elevated. Not taking lasix consistently  Morbid Obesity  Hx diverticultis s/p colectomy  Hx  AODM- AIC is 6.2%  Hyperlipidemia  Chronic musculoskeletal pain  Hx of depression  Hx of migraine H/As  Insomnia  Asthma  Hx of lichen planus  Hx of colitis- nongranulomatous  Hx of angioedema  Hx GERD  Hypothyroidism  Allergic rhinitis  Dependent edema  Vitamin D deficiency- take 2000 units D3 daily  Plan: Take Lasix consistently and RTC December 27th. Continue other medications as previously prescribed.

## 2016-01-10 NOTE — Patient Instructions (Addendum)
Restart Lasix on a regular basis and return December 27 for follow-up of edema and hypertension

## 2016-02-26 ENCOUNTER — Other Ambulatory Visit: Payer: Self-pay | Admitting: Internal Medicine

## 2016-02-26 MED FILL — MELOXICAM 15 MG TABLET: 15 | 30 days supply | Qty: 30 | Fill #2

## 2016-02-26 MED FILL — VENTOLIN HFA 90 MCG INHALER: 108 (90 BAS | 25 days supply | Qty: 18 | Fill #1

## 2016-02-26 MED FILL — LEVOTHYROXINE 200 MCG TAB: 200 | 90 days supply | Qty: 90 | Fill #1

## 2016-02-26 MED FILL — ESCITALOPRAM 10 MG TABLET: 10 | 90 days supply | Qty: 90 | Fill #1

## 2016-02-26 MED FILL — LOSARTAN POTASSIUM 50 MG TA: 50 | 90 days supply | Qty: 90 | Fill #1

## 2016-02-26 MED FILL — METOPROLOL TARTRATE 25 MG T: 25 | 90 days supply | Qty: 90 | Fill #1

## 2016-02-26 MED FILL — traZODone HCL 150 MG TABS: 150 | 90 days supply | Qty: 90 | Fill #1

## 2016-02-26 MED FILL — ESOMEPRAZOLE MAG DR 40 MG C: 40 | 90 days supply | Qty: 180 | Fill #1

## 2016-02-26 MED FILL — OSCIMIN SR 0.375 MG TABLET: 0.375 | 90 days supply | Qty: 180 | Fill #1

## 2016-02-26 MED FILL — ZOLPIDEM TARTRATE 10 MG TAB: 10 | 90 days supply | Qty: 90 | Fill #1

## 2016-02-29 NOTE — Telephone Encounter (Signed)
This is the correct name. OK to refill x 6 months

## 2016-03-01 MED FILL — ALPRAZolam 0.5 MG TABS: 0.5 | 90 days supply | Qty: 180 | Fill #0

## 2016-03-01 MED FILL — FUROSEMIDE 40 MG TABLET: 40 | 90 days supply | Qty: 90 | Fill #0

## 2016-03-21 DIAGNOSIS — M1711 Unilateral primary osteoarthritis, right knee: Secondary | ICD-10-CM | POA: Diagnosis not present

## 2016-03-21 DIAGNOSIS — S46011D Strain of muscle(s) and tendon(s) of the rotator cuff of right shoulder, subsequent encounter: Secondary | ICD-10-CM | POA: Diagnosis not present

## 2016-03-28 MED FILL — MELOXICAM 15 MG TABLET: 15 | 30 days supply | Qty: 30 | Fill #3

## 2016-04-18 ENCOUNTER — Other Ambulatory Visit: Payer: Self-pay | Admitting: Orthopedic Surgery

## 2016-04-18 DIAGNOSIS — M25561 Pain in right knee: Secondary | ICD-10-CM | POA: Diagnosis not present

## 2016-04-23 ENCOUNTER — Ambulatory Visit
Admission: RE | Admit: 2016-04-23 | Discharge: 2016-04-23 | Disposition: A | Discharge status: Home / Self Care | Payer: 59 | Source: Ambulatory Visit | Attending: Orthopedic Surgery | Admitting: Orthopedic Surgery

## 2016-04-23 DIAGNOSIS — S83281A Other tear of lateral meniscus, current injury, right knee, initial encounter: Secondary | ICD-10-CM | POA: Diagnosis not present

## 2016-04-23 DIAGNOSIS — M25561 Pain in right knee: Secondary | ICD-10-CM

## 2016-04-28 ENCOUNTER — Encounter: Payer: Self-pay | Admitting: Internal Medicine

## 2016-04-28 ENCOUNTER — Ambulatory Visit (INDEPENDENT_AMBULATORY_CARE_PROVIDER_SITE_OTHER): Payer: 59 | Admitting: Internal Medicine

## 2016-04-28 VITALS — BP 148/90 | HR 92 | Temp 97.9°F | Resp 18 | Wt 300.0 lb

## 2016-04-28 DIAGNOSIS — M545 Low back pain, unspecified: Secondary | ICD-10-CM

## 2016-04-28 DIAGNOSIS — M1711 Unilateral primary osteoarthritis, right knee: Secondary | ICD-10-CM | POA: Diagnosis not present

## 2016-04-28 MED ORDER — HYDROCODONE-ACETAMINOPHEN 10-325 MG PO TABS
1.0000 | ORAL_TABLET | Freq: Three times a day (TID) | ORAL | Status: DC | PRN
Start: 2016-04-28 — End: 2016-05-25

## 2016-04-28 MED ORDER — PREDNISONE 10 MG (21) PO TBPK: 10.0000 mg | ORAL_TABLET | Freq: Every day | ORAL | Status: DC

## 2016-04-28 MED FILL — HYDROCODON-APAP 10-325: 10-325 | 20 days supply | Qty: 60 | Fill #0

## 2016-04-28 MED FILL — predniSONE 10 MG TABS: 10 | 6 days supply | Qty: 21 | Fill #0

## 2016-04-28 NOTE — Patient Instructions (Addendum)
Continue meloxicam after completing course of steroids. Hydrcodone APAP 10/325 sparing q 8 hours for back pain. Sterapred DS 6 day dosepack.

## 2016-04-29 MED FILL — MELOXICAM 15 MG TABLET: 15 | 30 days supply | Qty: 30 | Fill #4

## 2016-04-29 MED FILL — MONTELUKAST SOD 10 MG TAB: 10 | 90 days supply | Qty: 90 | Fill #1

## 2016-05-02 DIAGNOSIS — M25561 Pain in right knee: Secondary | ICD-10-CM | POA: Diagnosis not present

## 2016-05-11 NOTE — Progress Notes (Signed)
   Subjective:    Patient ID: [name], female    DOB: 1950-12-18, 65 y.o.   MRN: [id number]  HPI Patient in today in a considerable amount of pain. She has developed some right lower back pain. No issues with right radiculopathy. Her right knee has been bothering her as well. It's been quite painful. She has appointment to see orthopedist in the near future. She is afraid she may have to have knee arthroplasty. Patient is morbidly obese. Has had back issues in the past. She has a history of anxiety depression, impaired glucose tolerance, essential hypertension, morbid obesity, hyperlipidemia, metabolic syndrome. History of hypothyroidism and history of B-12 deficiency. B 12 level was 266 which was low normal 8 years ago.   Review of Systems see above     Objective:   Physical Exam  Straight leg raising on the right is within normal limits. She has some tenderness around her right knee with minimal effusion. Her muscle strength in the right lower extremity is normal. She is tender in her right paralumbar area. Urinalysis is normal without blood or infection. Chest clear to auscultation. Cardiac exam regular rate and rhythm normal S1 and S2. Extremities without pitting edema.        Assessment & Plan:  Right paralumbar low back pain  Osteoarthritis right knee  Morbid obesity  Essential hypertension-blood pressure elevated due to pain  Hyperlipidemia  Impaired glucose tolerance-hemoglobin A1c checked in December  History of anxiety depression  Plan: Sterapred DS 10 mg 6 day dosepak. Hold meloxicam while taking prednisone. After dosepak finished, resume meloxicam. Hydrocodone/APAP 10/325 one by mouth every 8 hours when necessary pain.

## 2016-05-12 ENCOUNTER — Telehealth: Payer: Self-pay | Admitting: Internal Medicine

## 2016-05-12 ENCOUNTER — Ambulatory Visit: Payer: 59 | Admitting: Internal Medicine

## 2016-05-12 MED ORDER — PREDNISONE 10 MG (21) PO TBPK: 10.0000 mg | ORAL_TABLET | Freq: Every day | ORAL | Status: DC

## 2016-05-12 MED ORDER — PREDNISONE 10 MG (21) PO TBPK
10.0000 mg | ORAL_TABLET | Freq: Every day | ORAL | Status: DC
Start: 2016-05-12 — End: 2016-05-12

## 2016-05-12 MED FILL — predniSONE 10 MG TABS: 10 | 6 days supply | Qty: 21 | Fill #0

## 2016-05-12 NOTE — Telephone Encounter (Signed)
Patient states that her lower back and burning down her thigh; she's in severe pain.  States that the hydrocodone is not even helping at this point.  Advised that we moved her appointment up to 7/12 for the surgical consult.  Advised patient that you would be out of town all next week and I would send you a message today, however, I am unsure what more we are able to do at this point.  States that the Prednisone before was helpful, but the Hydrocodone alone at this point is not touching the pain.    Please advise.

## 2016-05-12 NOTE — Telephone Encounter (Signed)
Patient notified and med sent to pharmacy.  

## 2016-05-12 NOTE — Telephone Encounter (Signed)
Refill prednisone 

## 2016-05-24 ENCOUNTER — Other Ambulatory Visit: Payer: Self-pay | Admitting: Internal Medicine

## 2016-05-24 MED FILL — OSCIMIN SR 0.375 MG TABLET: 0.375 | 90 days supply | Qty: 180 | Fill #2

## 2016-05-24 MED FILL — LOSARTAN POTASSIUM 50 MG TA: 50 | 90 days supply | Qty: 90 | Fill #2

## 2016-05-24 MED FILL — METOPROLOL TARTRATE 25 MG T: 25 | 90 days supply | Qty: 90 | Fill #2

## 2016-05-24 MED FILL — ESOMEPRAZOLE MAG DR 40 MG C: 40 | 90 days supply | Qty: 180 | Fill #2

## 2016-05-24 MED FILL — ESCITALOPRAM 10 MG TABLET: 10 | 90 days supply | Qty: 90 | Fill #2

## 2016-05-24 MED FILL — MELOXICAM 15 MG TABLET: 15 | 30 days supply | Qty: 30 | Fill #5

## 2016-05-24 MED FILL — VENTOLIN HFA 90 MCG INHALER: 108 (90 BAS | 25 days supply | Qty: 18 | Fill #2

## 2016-05-24 MED FILL — traZODone HCL 150 MG TABS: 150 | 90 days supply | Qty: 90 | Fill #2

## 2016-05-24 MED FILL — LEVOTHYROXINE 200 MCG TAB: 200 | 90 days supply | Qty: 90 | Fill #2

## 2016-05-24 NOTE — Telephone Encounter (Signed)
Refill x 6 months 

## 2016-05-25 ENCOUNTER — Encounter: Payer: Self-pay | Admitting: Internal Medicine

## 2016-05-25 ENCOUNTER — Ambulatory Visit (INDEPENDENT_AMBULATORY_CARE_PROVIDER_SITE_OTHER): Payer: 59 | Admitting: Internal Medicine

## 2016-05-25 VITALS — BP 126/78 | HR 85 | Temp 99.0°F | Resp 18 | Ht 65.0 in | Wt 296.0 lb

## 2016-05-25 DIAGNOSIS — E039 Hypothyroidism, unspecified: Secondary | ICD-10-CM

## 2016-05-25 DIAGNOSIS — Z01818 Encounter for other preprocedural examination: Secondary | ICD-10-CM

## 2016-05-25 DIAGNOSIS — F329 Major depressive disorder, single episode, unspecified: Secondary | ICD-10-CM

## 2016-05-25 DIAGNOSIS — E119 Type 2 diabetes mellitus without complications: Secondary | ICD-10-CM | POA: Diagnosis not present

## 2016-05-25 DIAGNOSIS — E559 Vitamin D deficiency, unspecified: Secondary | ICD-10-CM | POA: Diagnosis not present

## 2016-05-25 DIAGNOSIS — E785 Hyperlipidemia, unspecified: Secondary | ICD-10-CM | POA: Diagnosis not present

## 2016-05-25 DIAGNOSIS — J309 Allergic rhinitis, unspecified: Secondary | ICD-10-CM

## 2016-05-25 DIAGNOSIS — R609 Edema, unspecified: Secondary | ICD-10-CM

## 2016-05-25 DIAGNOSIS — G4700 Insomnia, unspecified: Secondary | ICD-10-CM

## 2016-05-25 DIAGNOSIS — I10 Essential (primary) hypertension: Secondary | ICD-10-CM | POA: Diagnosis not present

## 2016-05-25 DIAGNOSIS — F32A Depression, unspecified: Secondary | ICD-10-CM

## 2016-05-25 DIAGNOSIS — M545 Low back pain, unspecified: Secondary | ICD-10-CM

## 2016-05-25 DIAGNOSIS — Z130 Encounter for screening for diseases of the blood and blood-forming organs and certain disorders involving the immune mechanism: Secondary | ICD-10-CM | POA: Diagnosis not present

## 2016-05-25 DIAGNOSIS — M1711 Unilateral primary osteoarthritis, right knee: Secondary | ICD-10-CM | POA: Diagnosis not present

## 2016-05-25 DIAGNOSIS — Z1239 Encounter for other screening for malignant neoplasm of breast: Secondary | ICD-10-CM

## 2016-05-25 DIAGNOSIS — Z8719 Personal history of other diseases of the digestive system: Secondary | ICD-10-CM

## 2016-05-25 LAB — CBC WITH DIFFERENTIAL/PLATELET
BASOS ABS: 115 {cells}/uL (ref 0–200)
Basophils Relative: 1 %
EOS ABS: 345 {cells}/uL (ref 15–500)
EOS PCT: 3 %
HCT: 42.4 % (ref 35.0–45.0)
Hemoglobin: 13.5 g/dL (ref 11.7–15.5)
LYMPHS PCT: 32 %
Lymphs Abs: 3680 cells/uL (ref 850–3900)
MCH: 27.2 pg (ref 27.0–33.0)
MCHC: 31.8 g/dL — AB (ref 32.0–36.0)
MCV: 85.3 fL (ref 80.0–100.0)
MPV: 11 fL (ref 7.5–12.5)
Monocytes Absolute: 1150 cells/uL — ABNORMAL HIGH (ref 200–950)
Monocytes Relative: 10 %
NEUTROS ABS: 6210 {cells}/uL (ref 1500–7800)
Neutrophils Relative %: 54 %
PLATELETS: 284 10*3/uL (ref 140–400)
RBC: 4.97 MIL/uL (ref 3.80–5.10)
RDW: 14.6 % (ref 11.0–15.0)
WBC: 11.5 10*3/uL — ABNORMAL HIGH (ref 3.8–10.8)

## 2016-05-25 LAB — HEMOGLOBIN A1C
Hgb A1c MFr Bld: 5.8 % — ABNORMAL HIGH (ref <5.7)
MEAN PLASMA GLUCOSE: 120 mg/dL

## 2016-05-25 MED ORDER — CLOBETASOL PROPIONATE 0.05 % EX OINT: 1.0000 "application " | TOPICAL_OINTMENT | Freq: Two times a day (BID) | CUTANEOUS | Status: DC

## 2016-05-25 MED ORDER — HYDROCODONE-ACETAMINOPHEN 10-325 MG PO TABS: 1.0000 | ORAL_TABLET | Freq: Three times a day (TID) | ORAL | Status: DC | PRN

## 2016-05-25 MED FILL — HYDROCODON-APAP 10-325: 10-325 | 30 days supply | Qty: 90 | Fill #0

## 2016-05-25 MED FILL — ZOLPIDEM TARTRATE 10 MG TAB: 10 | 90 days supply | Qty: 90 | Fill #0

## 2016-05-25 MED FILL — CLOBETASOL 0.05% OINTMENT: 0.05 | 20 days supply | Qty: 30 | Fill #0

## 2016-05-25 NOTE — Telephone Encounter (Signed)
Phoned to pharmacy 

## 2016-05-26 ENCOUNTER — Encounter: Payer: Self-pay | Admitting: Internal Medicine

## 2016-05-26 LAB — LIPID PANEL
CHOLESTEROL: 192 mg/dL (ref 125–200)
HDL: 43 mg/dL — ABNORMAL LOW (ref >=46)
LDL Cholesterol: 110 mg/dL (ref <130)
TRIGLYCERIDES: 194 mg/dL — AB (ref <150)
Total CHOL/HDL Ratio: 4.5 Ratio (ref <=5.0)
VLDL: 39 mg/dL — ABNORMAL HIGH (ref <30)

## 2016-05-26 LAB — COMPLETE METABOLIC PANEL WITH GFR
ALT: 15 U/L (ref 6–29)
AST: 15 U/L (ref 10–35)
Albumin: 3.8 g/dL (ref 3.6–5.1)
Alkaline Phosphatase: 71 U/L (ref 33–130)
BILIRUBIN TOTAL: 0.4 mg/dL (ref 0.2–1.2)
BUN: 10 mg/dL (ref 7–25)
CHLORIDE: 101 mmol/L (ref 98–110)
CO2: 25 mmol/L (ref 20–31)
Calcium: 9.3 mg/dL (ref 8.6–10.4)
Creat: 0.93 mg/dL (ref 0.50–0.99)
GFR, EST AFRICAN AMERICAN: 76 mL/min (ref >=60)
GFR, EST NON AFRICAN AMERICAN: 66 mL/min (ref >=60)
GLUCOSE: 120 mg/dL — AB (ref 65–99)
POTASSIUM: 4.2 mmol/L (ref 3.5–5.3)
SODIUM: 139 mmol/L (ref 135–146)
TOTAL PROTEIN: 6.4 g/dL (ref 6.1–8.1)

## 2016-05-26 LAB — MICROALBUMIN, URINE: MICROALB UR: 2.6 mg/dL

## 2016-05-26 LAB — TSH: TSH: 2.43 m[IU]/L

## 2016-06-03 ENCOUNTER — Encounter: Payer: 59 | Admitting: Internal Medicine

## 2016-06-08 ENCOUNTER — Other Ambulatory Visit: Payer: Self-pay | Admitting: Orthopedic Surgery

## 2016-06-10 ENCOUNTER — Encounter (HOSPITAL_COMMUNITY): Payer: Self-pay

## 2016-06-10 ENCOUNTER — Encounter (HOSPITAL_COMMUNITY)
Admission: RE | Admit: 2016-06-10 | Discharge: 2016-06-10 | Disposition: A | Discharge status: Home / Self Care | Payer: 59 | Source: Ambulatory Visit | Attending: Orthopedic Surgery | Admitting: Orthopedic Surgery

## 2016-06-10 DIAGNOSIS — K219 Gastro-esophageal reflux disease without esophagitis: Secondary | ICD-10-CM | POA: Insufficient documentation

## 2016-06-10 DIAGNOSIS — Z01818 Encounter for other preprocedural examination: Secondary | ICD-10-CM | POA: Insufficient documentation

## 2016-06-10 DIAGNOSIS — J45909 Unspecified asthma, uncomplicated: Secondary | ICD-10-CM | POA: Insufficient documentation

## 2016-06-10 DIAGNOSIS — M1711 Unilateral primary osteoarthritis, right knee: Secondary | ICD-10-CM | POA: Insufficient documentation

## 2016-06-10 DIAGNOSIS — R9431 Abnormal electrocardiogram [ECG] [EKG]: Secondary | ICD-10-CM | POA: Insufficient documentation

## 2016-06-10 DIAGNOSIS — Z7984 Long term (current) use of oral hypoglycemic drugs: Secondary | ICD-10-CM | POA: Diagnosis not present

## 2016-06-10 DIAGNOSIS — Z87891 Personal history of nicotine dependence: Secondary | ICD-10-CM | POA: Diagnosis not present

## 2016-06-10 DIAGNOSIS — Z79899 Other long term (current) drug therapy: Secondary | ICD-10-CM | POA: Insufficient documentation

## 2016-06-10 DIAGNOSIS — E039 Hypothyroidism, unspecified: Secondary | ICD-10-CM | POA: Insufficient documentation

## 2016-06-10 DIAGNOSIS — I10 Essential (primary) hypertension: Secondary | ICD-10-CM | POA: Diagnosis not present

## 2016-06-10 DIAGNOSIS — Z01812 Encounter for preprocedural laboratory examination: Secondary | ICD-10-CM | POA: Diagnosis not present

## 2016-06-10 HISTORY — DX: Reserved for inherently not codable concepts without codable children: IMO0001

## 2016-06-10 HISTORY — DX: Unspecified osteoarthritis, unspecified site: M19.90

## 2016-06-10 HISTORY — DX: Personal history of urinary calculi: Z87.442

## 2016-06-10 HISTORY — DX: Pneumonia, unspecified organism: J18.9

## 2016-06-10 HISTORY — DX: Family history of other specified conditions: Z84.89

## 2016-06-10 LAB — CBC
HEMATOCRIT: 42 % (ref 36.0–46.0)
Hemoglobin: 13.4 g/dL (ref 12.0–15.0)
MCH: 27 pg (ref 26.0–34.0)
MCHC: 31.9 g/dL (ref 30.0–36.0)
MCV: 84.5 fL (ref 78.0–100.0)
PLATELETS: 311 10*3/uL (ref 150–400)
RBC: 4.97 MIL/uL (ref 3.87–5.11)
RDW: 14.1 % (ref 11.5–15.5)
WBC: 11.5 10*3/uL — ABNORMAL HIGH (ref 4.0–10.5)

## 2016-06-10 LAB — BASIC METABOLIC PANEL
Anion gap: 8 (ref 5–15)
BUN: 16 mg/dL (ref 6–20)
CO2: 28 mmol/L (ref 22–32)
Calcium: 9 mg/dL (ref 8.9–10.3)
Chloride: 104 mmol/L (ref 101–111)
Creatinine, Ser: 0.93 mg/dL (ref 0.44–1.00)
GFR calc non Af Amer: >60 mL/min (ref >60)
GLUCOSE: 116 mg/dL — AB (ref 65–99)
Potassium: 3.7 mmol/L (ref 3.5–5.1)
Sodium: 140 mmol/L (ref 135–145)

## 2016-06-10 LAB — SURGICAL PCR SCREEN
MRSA, PCR: NEGATIVE
STAPHYLOCOCCUS AUREUS: NEGATIVE

## 2016-06-10 NOTE — Pre-Procedure Instructions (Addendum)
ROSALIA WESTOVER  06/10/2016      [place], [place] - [address] [facility]. [address] [place] [address] Phone: [phone] Fax: [place], [place] [address] Phone: [phone] Fax: [phone]  [id number] - [place], [place] - [address] [place] [address] [place] [address] Phone: [phone] Fax: [phone]    Your procedure is scheduled on *06/21/16  Report to [facility] Admitting at 530 A.M.  Call this number if you have problems the morning of surgery:  [phone]   Remember:  Do not eat food or drink liquids after midnight.  Take these medicines the morning of surgery with A SIP OF WATER inhalers if needed,lexapro,nexium,levothyroxine,metoprolol, hydrocodone if needed,oscimin if needed  STOP all herbel meds, nsaids (aleve,naproxen,advil,ibuprofen) 5 days prior to surgery starting 06/16/16 including aspirin,excedrin,all vitamins, meloxicam   Do not wear jewelry, make-up or nail polish.  Do not wear lotions, powders, or perfumes.  You may wear deoderant.  Do not shave 48 hours prior to surgery.  Men may shave face and neck.  Do not bring valuables to the hospital.  [facility] is not responsible for any belongings or valuables.  Contacts, dentures or bridgework may not be worn into surgery.  Leave your suitcase in the car.  After surgery it may be brought to your room.  For patients admitted to the hospital, discharge time will be determined by your treatment team.  Patients discharged the day of surgery will not be allowed to drive home.   Name and phone number of your driver:    Special instructions:   Special Instructions: [place] - Preparing for Surgery  Before surgery, you can play an important role.   Because skin is not sterile, your skin needs to be as free of germs as possible.  You can reduce the number of germs on you skin by washing with CHG (chlorahexidine gluconate) soap before surgery.  CHG is an antiseptic cleaner which kills germs and bonds with the skin to continue killing germs even after washing.  Please DO NOT use if you have an allergy to CHG or antibacterial soaps.  If your skin becomes reddened/irritated stop using the CHG and inform your nurse when you arrive at Short Stay.  Do not shave (including legs and underarms) for at least 48 hours prior to the first CHG shower.  You may shave your face.  Please follow these instructions carefully:   1.  Shower with CHG Soap the night before surgery and the morning of Surgery.  2.  If you choose to wash your hair, wash your hair first as usual with your normal shampoo.  3.  After you shampoo, rinse your hair and body thoroughly to remove the Shampoo.  4.  Use CHG as you would any other liquid soap.  You can apply chg directly  to the skin and wash gently with scrungie or a clean washcloth.  5.  Apply the CHG Soap to your body ONLY FROM THE NECK DOWN.  Do not use on open wounds or open sores.  Avoid contact with your eyes ears, mouth and genitals (private parts).  Wash genitals (private parts)       with your normal soap.  6.  Wash thoroughly, paying special  attention to the area where your surgery will be performed.  7.  Thoroughly rinse your body with warm water from the neck down.  8.  DO NOT shower/wash with your normal soap after using and rinsing off the CHG Soap.  9.  Pat yourself dry with a clean towel.            10.  Wear clean pajamas.            11.  Place clean sheets on your bed the night of your first shower and do not sleep with pets.  Day of Surgery  Do not apply any lotions/deodorants the morning of surgery.  Please wear clean clothes to the hospital/surgery center.  Please read over the following fact sheets  that you were given. Pain Booklet and MRSA Information

## 2016-06-10 NOTE — Progress Notes (Signed)
Office called [phone] hi ted hose-message left for sherry that we do not have teds to fit patient calf sixe 52cm(20.5 inches) 36 cm length(14 inches)

## 2016-06-12 NOTE — Progress Notes (Signed)
Subjective:    Patient ID: [name], female    DOB: Feb 04, 1951, 65 y.o.   MRN: [id number]  HPI 65 year old White Female with history of morbid obesity, hypertension, hyperlipidemia hypothyroidism controlled type 2 diabetes mellitus, allergic rhinitis, vitamin D deficiency, recent protracted low back pain seen today for preoperative physical examination and evaluation.  Dr. Mardelle Matte plans right knee arthroplasty August 8.  Patient has been treated recently with prednisone for low back pain that was protracted. This started in June.  She has a history of GE reflux, depression, insomnia, ulcerative colitis, hypokalemia, history of migraine headaches, history of asthma. History of B-12 deficiency.  History of diverticulitis of the proximal descending colon status post laparoscopic assisted left colectomy June 2014.  History of angioedema. History of lichen planus.  History of right shoulder arthroscopy, acromioplasty and rotator cuff repair January 2016 by Dr. Mardelle Matte. 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  Past medical history: Fractured left radial head 1995, epicondylitis right elbow in the past, strep throat January 2005, right upper lobe pneumonia March 2011, C-section 1977, abdominal hysterectomy and right salpingectomy January 1998, cataract extraction right eye September 2006 and left September 2008. These were done 2 weeks apart. Repair of left knee medial meniscus June 2008.  Patient was admitted December 2007 with syncope. Had herpes zoster July 2007. Hospitalized with probable viral gastroneuritis April 2011. History of angioedema of the face February 2011. Surgery for chronic sinusitis November 2010. History of gallstones and hepatic steatosis. History of iron deficiency anemia that resolved after hysterectomy. History of noncardiac and nonpulmonary chest pain 2011. At that time had negative  cardiac enzymes, negative EKG, negative CT angiography although her d-dimer was elevated  Colonoscopy 2009 showed severe colitis in the left colon. She previously had panendoscopy January 2008 and wish times a nodules were seen in the terminal ileum that were thought to possibly be inflammatory bowel disease or from aspirin or anti-inflammatory medications. She had diverticulosis and small hemorrhoids. Pathology done on biopsy June 2009 revealed ulcerative colonic mucosa without granulomas. Differential included ischemic related ulceration.  She has history of positive allergy skin test to dust mite and mold in 2009. Spirometry showed FVC of 2.61 which was 81% of predicted. FEV1 was 1.96 which was 75% of predicted. Following administration of inhaled albuterol, FEV1 roast and 93% of predicted.  Family history: Father deceased with history of stroke, hypertension, diabetes and COPD. Mother deceased with history of congestive heart failure and chronic kidney disease. One sister and one son in good health.  She has been a patient in this office in September 1996.  Social history: She is divorced. Does not smoke. Occasional alcohol consumption. Long-standing history of morbid obesity. Weight in 1996 was 307 pounds, in November 2001 weight was 282 pounds. Weight in 2007 was 289 pounds. Weight in January 2011 was 290 pounds. Weight in January 2012 was 294 pounds.    Review of Systems significant knee pain at this point in time and depression regarding the need for upcoming surgery. Back pain has improved.     Objective:   Physical Exam  Constitutional: She is oriented to person, place, and time. She appears well-developed and well-nourished.  HENT:  Head: Normocephalic and atraumatic.  Eyes: Pupils are equal, round, and reactive to light. Right eye exhibits no discharge. Left eye exhibits no discharge. No scleral icterus.  Neck: Neck supple. No JVD present. No thyromegaly  present.  Cardiovascular:  Normal rate, regular rhythm, normal heart sounds and intact distal pulses.   No murmur heard. Pulmonary/Chest: No respiratory distress. She has no wheezes. She has no rales.  Abdominal: Soft. Bowel sounds are normal. She exhibits no distension and no mass. There is no tenderness. There is no rebound and no guarding.  Musculoskeletal: She exhibits no edema.  Neurological: She is alert and oriented to person, place, and time. No cranial nerve deficit. Coordination normal.  Skin: Skin is warm and dry. No rash noted.  Psychiatric: She has a normal mood and affect. Her behavior is normal. Judgment and thought content normal.  Vitals reviewed.         Assessment & Plan:  End-stage osteoarthritis right knee scheduled for arthroplasty August 8  Essential hypertension  Morbid obesity  History of diverticulitis status post colectomy  History of adult onset diabetes mellitus  Hyperlipidemia  Hypothyroidism  History of depression  History of migraine headaches  History of asthma  History of lichen planus  History of nongranulomatous colitis  History of angioedema  GE reflux  Allergic rhinitis  Vitamin D deficiency-Eyman D level December 2016 was 26 and she was advised take 2000 units vitamin D 3 daily  Recurrent low back pain  History of dependent edema  Plan: Patient is cleared for surgery on August 8. Hemoglobin A1c stable at 5.8%. Has mildly elevated triglycerides. I think white blood cell count is mildly elevated due to steroids given for back pain.

## 2016-06-12 NOTE — Patient Instructions (Signed)
Continue same medications. Cleared for surgery on August 8. I think white blood cell count is elevated due to recent steroid therapy.

## 2016-06-14 ENCOUNTER — Encounter (HOSPITAL_COMMUNITY): Payer: Self-pay | Admitting: Emergency Medicine

## 2016-06-14 NOTE — Progress Notes (Signed)
Anesthesia Chart Review:  Pt is a 65 year old female scheduled for R total knee arthroplasty on 06/21/2016 with Marchia Bond, MD.   PMH includes:  HTN, DM (pt denies), asthma, hypothyroidism, anemia, GERD. Former smoker. BMI 50  Medications include: ASA, nexium, pepcid, lasix, levothyroxine, losartan, metoprolol, albuterol  Preoperative labs reviewed.  Glucose 116. HgbA1c was 5.8 on 05/25/16.   EKG 06/10/16: Sinus bradycardia (59 bpm) with 1st degree A-V block. ST & T wave abnormality, consider inferior ischemia. ST & T wave abnormality, consider anterior ischemia. ST and T wave changes new since last EKG tracing 12/03/14.   Reviewed case with Dr. Kalman Shan. Pt will need cardiac evaluation prior to surgery. Left voicemail for Sherrie in Dr. [name] office.   Willeen Cass, FNP-BC [facility]/Anesthesiology Phone: [phone] 06/14/2016 4:26 PM

## 2016-06-24 ENCOUNTER — Other Ambulatory Visit (HOSPITAL_COMMUNITY): Payer: 59

## 2016-06-28 ENCOUNTER — Inpatient Hospital Stay (HOSPITAL_COMMUNITY): Admission: RE | Admit: 2016-06-28 | Payer: 59 | Source: Ambulatory Visit | Admitting: Orthopedic Surgery

## 2016-06-28 ENCOUNTER — Encounter (HOSPITAL_COMMUNITY): Admission: RE | Payer: Self-pay | Source: Ambulatory Visit

## 2016-06-28 SURGERY — ARTHROPLASTY, KNEE, TOTAL
Anesthesia: General | Site: Knee | Laterality: Right

## 2016-07-20 ENCOUNTER — Encounter: Payer: Self-pay | Admitting: Interventional Cardiology

## 2016-07-22 ENCOUNTER — Ambulatory Visit: Payer: 59 | Admitting: Interventional Cardiology

## 2016-08-03 MED FILL — ALPRAZolam 0.5 MG TABS: 0.5 | 90 days supply | Qty: 180 | Fill #1

## 2016-08-04 ENCOUNTER — Encounter: Payer: Self-pay | Admitting: Interventional Cardiology

## 2016-08-04 ENCOUNTER — Ambulatory Visit (INDEPENDENT_AMBULATORY_CARE_PROVIDER_SITE_OTHER): Payer: 59 | Admitting: Interventional Cardiology

## 2016-08-04 VITALS — BP 150/90 | HR 64 | Ht 65.0 in | Wt 299.0 lb

## 2016-08-04 DIAGNOSIS — I10 Essential (primary) hypertension: Secondary | ICD-10-CM | POA: Diagnosis not present

## 2016-08-04 DIAGNOSIS — R9431 Abnormal electrocardiogram [ECG] [EKG]: Secondary | ICD-10-CM | POA: Diagnosis not present

## 2016-08-04 DIAGNOSIS — E1100 Type 2 diabetes mellitus with hyperosmolarity without nonketotic hyperglycemic-hyperosmolar coma (NKHHC): Secondary | ICD-10-CM

## 2016-08-04 DIAGNOSIS — Z01810 Encounter for preprocedural cardiovascular examination: Secondary | ICD-10-CM | POA: Insufficient documentation

## 2016-08-04 MED FILL — VENTOLIN HFA 90 MCG INHALER: 108 (90 BAS | 25 days supply | Qty: 18 | Fill #3

## 2016-08-04 NOTE — Progress Notes (Signed)
Cardiology Office Note    Date:  08/04/2016   ID:  [name], DOB 1951/08/25, MRN [id number]  PCP:  Elby Showers, MD  Cardiologist: Sinclair Grooms, MD   Chief Complaint  Patient presents with  . Follow-up    Pre-Op    History of Present Illness:  [name] is a 65 y.o. female who is here for preoperative evaluation prior to knee replacement surgery. The concern is an abnormal EKG done in preop evaluation.  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 There is no preceding history of heart disease. She has been relatively inactive recently because of severe bilateral knee arthritis. She has never had myocardial infarction or coronary issues. She denies exertional chest discomfort, orthopnea, PND, and dyspnea. No history of arrhythmia. She does have a history of hypertension, obesity, and mild hyperlipidemia that of been well controlled. There is a history of glucose intolerance but she feels that there is no diagnosis of diabetes. She is on no therapy. No family history of coronary disease.  She has a history of GE reflux, depression, insomnia, ulcerative colitis, hypokalemia, history of migraine headaches, history of asthma. History of B-12 deficiency. Dr. Renold Genta classifies her as controlled diabetes mellitus, type II.  Father deceased with history of stroke, hypertension, diabetes and COPD. Mother deceased with history of congestive heart failure and chronic kidney disease. One sister and one son in good health.  Past Medical History:  Diagnosis Date  . Anemia    none recently  . Anxiety   . Arthritis   . Asthma   . Complete tear of right rotator cuff 12/05/2014  . Depression   . Diabetes mellitus    not on meds.denies a1c 5.8  . Diverticulitis   . Diverticulosis   . Family history of adverse reaction to anesthesia    mom  nausea and vomiting  . GERD (gastroesophageal reflux disease)   . Hypertension   . Hypothyroidism    . Migraine   . Personal history of kidney stones   . Pneumonia    hx  . Shortness of breath dyspnea    exersion  . Thyroid disease    hypo  . Wears glasses     Past Surgical History:  Procedure Laterality Date  . ABDOMINAL HYSTERECTOMY    . CATARACT EXTRACTION, BILATERAL    . CESAREAN SECTION    . COLONOSCOPY     multiple   . ESOPHAGOGASTRODUODENOSCOPY ENDOSCOPY     multiple  . KNEE CARTILAGE SURGERY     Left  . LAPAROSCOPIC PARTIAL COLECTOMY N/A 04/22/2013   Procedure: LAPAROSCOPIC PARTIAL COLECTOMY;  Surgeon: Odis Hollingshead, MD;  Location: [facility];  Service: General;  Laterality: N/A;  . right foot surgery  little toe and next toe   x 3  . SHOULDER ARTHROSCOPY WITH ROTATOR CUFF REPAIR AND SUBACROMIAL DECOMPRESSION Right 12/05/2014   Procedure: RIGHT SHOULDER ARTHROSCOPY,ACROMOPLASTY, ROTATOR CUFF REPAIR;  Surgeon: [name], MD;  Location: [facility];  Service: Orthopedics;  Laterality: Right;    Current Medications: Outpatient Medications Prior to Visit  Medication Sig Dispense Refill  . ALPRAZolam (XANAX) 0.5 MG tablet TAKE 1 TABLET BY MOUTH TWICE DAILY AS NEEDED 180 tablet 1  . aspirin 81 MG tablet Take 81 mg by mouth daily.      Marland Kitchen aspirin-acetaminophen-caffeine (EXCEDRIN MIGRAINE) 250-250-65 MG per tablet Take 1 tablet by mouth every 6 (six) hours as needed for headache. For pain/headache    .  cetirizine (ZYRTEC) 10 MG tablet Take 10 mg by mouth at bedtime.     . clobetasol ointment (TEMOVATE) [id number] % Apply 1 application topically 2 (two) times daily. 30 g 0  . EPINEPHRINE 0.3 mg/0.3 mL IJ SOAJ injection INJECT 0.3 MLS INTO THE MUSCLE ONCE. 2 Device PRN  . escitalopram (LEXAPRO) 10 MG tablet TAKE 1 TABLET BY MOUTH DAILY. 90 tablet 3  . esomeprazole (NEXIUM) 40 MG capsule TAKE 1 CAPSULE BY MOUTH TWICE DAILY 180 capsule 3  . levothyroxine (SYNTHROID, LEVOTHROID) 200 MCG tablet TAKE 1 TABLET BY MOUTH DAILY BEFORE BREAKFAST. 90 tablet 3  . losartan  (COZAAR) 50 MG tablet TAKE 1 TABLET BY MOUTH ONCE DAILY FOR HYPERTENSION 90 tablet 3  . metoprolol tartrate (LOPRESSOR) 25 MG tablet TAKE 1 TABLET BY MOUTH EVERY MORNING. 90 tablet 3  . montelukast (SINGULAIR) 10 MG tablet Take 1 tablet (10 mg total) by mouth at bedtime. 90 tablet 1  . ondansetron (ZOFRAN) 4 MG tablet Take 1 tablet (4 mg total) by mouth every 8 (eight) hours as needed for nausea or vomiting. 30 tablet 0  . traZODone (DESYREL) 150 MG tablet TAKE 1 TABLET BY MOUTH AT BEDTIME. 90 tablet 3  . VENTOLIN HFA 108 (90 Base) MCG/ACT inhaler INHALE 2 PUFFS INTO THE LUNGS EVERY 6 HOURS AS NEEDED FOR WHEEZING. 18 g 11  . zolpidem (AMBIEN) 10 MG tablet TAKE 1 TABLET BY MOUTH ONCE DAILY AT BEDTIME AS NEEDED 90 tablet 1  . Cholecalciferol (VITAMIN D) 2000 UNITS tablet Take 2,000 Units by mouth daily.      . famotidine (PEPCID) 20 MG tablet Take 20 mg by mouth at bedtime.      . ferrous sulfate (FERROUSUL) 325 (65 FE) MG tablet Take 1 tablet (325 mg total) by mouth 2 (two) times daily with a meal. (Patient not taking: Reported on 08/04/2016) 60 tablet 3  . furosemide (LASIX) 40 MG tablet TAKE 1 TABLET BY MOUTH DAILY. (Patient not taking: Reported on 08/04/2016) 90 tablet 1  . HYDROcodone-acetaminophen (NORCO) 10-325 MG tablet Take 1 tablet by mouth every 8 (eight) hours as needed. (Patient not taking: Reported on 08/04/2016) 90 tablet 0  . meloxicam (MOBIC) 15 MG tablet Take 1 tablet (15 mg total) by mouth daily. (Patient not taking: Reported on 08/04/2016) 30 tablet 5  . OSCIMIN SR 0.375 MG 12 hr tablet TAKE 1 TABLET BY MOUTH TWICE DAILY (Patient not taking: Reported on 08/04/2016) 180 tablet 3   No facility-administered medications prior to visit.      Allergies:   Levaquin [levofloxacin]; Lisinopril; Bee venom; and Morphine and related   Social History   Social History  . Marital status: Single    Spouse name: N/A  . Number of children: N/A  . Years of education: N/A   Social History Main  Topics  . Smoking status: Former Smoker    Packs/day: 0.50    Years: 6.00    Types: Cigarettes    Quit date: 11/14/1982  . Smokeless tobacco: Never Used     Comment: quit smoking 30 years ago  . Alcohol use Yes     Comment:  very rare   . Drug use: No  . Sexual activity: Not Asked   Other Topics Concern  . None   Social History Narrative  . None     Family History:  The patient's family history includes Breast cancer in her paternal aunt; Diabetes in her father; Heart disease in her mother; Kidney failure in her  father.   ROS:   Please see the history of present illness.    Occasional ankle edema after prolonged periods of dependency. Bilateral knee discomfort. Occasional palpitations. Chronic back pain. Difficulty with balance and walking. Occasional headaches.  All other systems reviewed and are negative.   PHYSICAL EXAM:   VS:  BP (!) 150/90   Pulse 64   Ht 5\' 5"  (1.651 m)   Wt 299 lb (135.6 kg)   BMI 49.76 kg/m    GEN: Well nourished, well developed, in no acute distress . Morbidly obese. HEENT: normal  Neck: no JVD, carotid bruits, or masses Cardiac: RRR; no murmurs, rubs, or gallops,no edema  Respiratory:  clear to auscultation bilaterally, normal work of breathing GI: soft, nontender, nondistended, + BS MS: no deformity or atrophy  Skin: warm and dry, no rash Neuro:  Alert and Oriented x 3, Strength and sensation are intact Psych: euthymic mood, full affect  Wt Readings from Last 3 Encounters:  08/04/16 299 lb (135.6 kg)  06/10/16 (!) 302 lb (137 kg)  05/25/16 296 lb (134.3 kg)      Studies/Labs Reviewed:   EKG:  EKG  Normal sinus rhythm with first-degree AV block. Other first degree AV block the tracing is normal. Compared to the July EKG tracing T wave inversion has resolved.  Recent Labs: 05/25/2016: ALT 15; TSH 2.43 06/10/2016: BUN 16; Creatinine, Ser 0.93; Hemoglobin 13.4; Platelets 311; Potassium 3.7; Sodium 140   Lipid Panel    Component Value  Date/Time   CHOL 192 05/25/2016 1213   TRIG 194 (H) 05/25/2016 1213   HDL 43 (L) 05/25/2016 1213   CHOLHDL 4.5 05/25/2016 1213   VLDL 39 (H) 05/25/2016 1213   LDLCALC 110 05/25/2016 1213    Additional studies/ records that were reviewed today include:  Reviewed prior medical problems, problem list, and data including all EKGs.     ASSESSMENT:    1. Preoperative cardiovascular examination   2. Essential hypertension   3. Abnormal EKG   4. Morbid obesity due to excess calories ([place])   5. Type 2 diabetes mellitus with hyperosmolarity without coma, without long-term current use of insulin (H[facility])      PLAN:  In order of problems listed above:  1. Cleared for upcoming surgery with Dr. Mardelle Matte. Based upon exam and history there are no active cardiac issues. She will be at low to moderate risk for surgery due to her age and obesity. 2. Well controlled at this time. 3. Transient T-wave inversion in the absence of any symptom is of uncertain clinical significance. Possibilities include hyperventilation and or hypokalemia at the time of the tracing. Potassium drawn the day before the EKG was 3.7. No specific workup is felt to be indicated with normalization of the EKG, and stable appearance when compared to prior tracings back to 2014. We discussed a myocardial perfusion study as the ultimate screening tool prior to surgery but she was very unenthusiastic. Noninvasive evaluation in this lady in the absence of symptoms does run the risk of false positive testing which would then lead to workup that is not needed. As long as she is clinically asymptomatic and did not feel that further evaluation is needed 4. Not addressed 5. Well control with hemoglobin A1c's consistently less than 6.5.    Medication Adjustments/Labs and Tests Ordered: Current medicines are reviewed at length with the patient today.  Concerns regarding medicines are outlined above.  Medication changes, Labs and Tests ordered  today are listed in the  Patient Instructions below. Patient Instructions  Medication Instructions:  Your physician recommends that you continue on your current medications as directed. Please refer to the Current Medication list given to you today.   Labwork: None Ordered   Testing/Procedures: None Ordered   Follow-Up: Follow-up with Dr. [name] as needed.  Any Other Special Instructions Will Be Listed Below (If Applicable).  You are clear for your upcoming surgery with [place].     If you need a refill on your cardiac medications before your next appointment, please call your pharmacy.      Signed, Sinclair Grooms, MD  08/04/2016 10:32 AM    [facility] [place], [place], [place]  [address] Phone: [phone]; Fax: [phone]

## 2016-08-04 NOTE — Patient Instructions (Addendum)
Medication Instructions:  Your physician recommends that you continue on your current medications as directed. Please refer to the Current Medication list given to you today.   Labwork: None Ordered   Testing/Procedures: None Ordered   Follow-Up: Follow-up with Dr. [name] as needed.  Any Other Special Instructions Will Be Listed Below (If Applicable).  You are clear for your upcoming surgery with [place].     If you need a refill on your cardiac medications before your next appointment, please call your pharmacy.

## 2016-08-11 DIAGNOSIS — Z961 Presence of intraocular lens: Secondary | ICD-10-CM | POA: Diagnosis not present

## 2016-08-11 DIAGNOSIS — H1851 Endothelial corneal dystrophy: Secondary | ICD-10-CM | POA: Diagnosis not present

## 2016-08-11 DIAGNOSIS — H04123 Dry eye syndrome of bilateral lacrimal glands: Secondary | ICD-10-CM | POA: Diagnosis not present

## 2016-08-21 ENCOUNTER — Encounter: Payer: Self-pay | Admitting: Internal Medicine

## 2016-08-22 ENCOUNTER — Other Ambulatory Visit: Payer: Self-pay | Admitting: *Deleted

## 2016-08-22 MED ORDER — ESCITALOPRAM OXALATE 10 MG PO TABS: 10.0000 mg | ORAL_TABLET | Freq: Every day | ORAL | 3 refills | Status: DC

## 2016-08-22 MED ORDER — LEVOTHYROXINE SODIUM 200 MCG PO TABS: ORAL_TABLET | ORAL | 3 refills | Status: DC

## 2016-08-22 MED ORDER — ZOLPIDEM TARTRATE 10 MG PO TABS: ORAL_TABLET | ORAL | 3 refills | Status: DC

## 2016-08-22 MED ORDER — LOSARTAN POTASSIUM 50 MG PO TABS: ORAL_TABLET | ORAL | 3 refills | Status: DC

## 2016-08-22 MED ORDER — ALPRAZOLAM 0.5 MG PO TABS: 0.5000 mg | ORAL_TABLET | Freq: Two times a day (BID) | ORAL | 3 refills | Status: DC | PRN

## 2016-08-22 MED ORDER — TRAZODONE HCL 150 MG PO TABS: 150.0000 mg | ORAL_TABLET | Freq: Every day | ORAL | 3 refills | Status: DC

## 2016-08-22 MED ORDER — METOPROLOL TARTRATE 25 MG PO TABS: 25.0000 mg | ORAL_TABLET | Freq: Every morning | ORAL | 3 refills | Status: DC

## 2016-11-21 ENCOUNTER — Other Ambulatory Visit: Payer: Self-pay | Admitting: Internal Medicine

## 2016-11-21 DIAGNOSIS — Z1231 Encounter for screening mammogram for malignant neoplasm of breast: Secondary | ICD-10-CM

## 2016-11-22 ENCOUNTER — Ambulatory Visit (INDEPENDENT_AMBULATORY_CARE_PROVIDER_SITE_OTHER): Payer: BLUE CROSS/BLUE SHIELD | Admitting: Internal Medicine

## 2016-11-22 ENCOUNTER — Encounter: Payer: Self-pay | Admitting: Internal Medicine

## 2016-11-22 VITALS — BP 150/90 | HR 64 | Temp 98.4°F | Ht 65.0 in | Wt 290.0 lb

## 2016-11-22 DIAGNOSIS — E784 Other hyperlipidemia: Secondary | ICD-10-CM | POA: Diagnosis not present

## 2016-11-22 DIAGNOSIS — Z8719 Personal history of other diseases of the digestive system: Secondary | ICD-10-CM | POA: Diagnosis not present

## 2016-11-22 DIAGNOSIS — E7849 Other hyperlipidemia: Secondary | ICD-10-CM

## 2016-11-22 DIAGNOSIS — F3289 Other specified depressive episodes: Secondary | ICD-10-CM

## 2016-11-22 DIAGNOSIS — I10 Essential (primary) hypertension: Secondary | ICD-10-CM | POA: Diagnosis not present

## 2016-11-22 DIAGNOSIS — E039 Hypothyroidism, unspecified: Secondary | ICD-10-CM | POA: Diagnosis not present

## 2016-11-22 DIAGNOSIS — E119 Type 2 diabetes mellitus without complications: Secondary | ICD-10-CM

## 2016-11-23 LAB — BASIC METABOLIC PANEL
BUN: 13 mg/dL (ref 7–25)
CO2: 26 mmol/L (ref 20–31)
Calcium: 9.2 mg/dL (ref 8.6–10.4)
Chloride: 102 mmol/L (ref 98–110)
Creat: 1 mg/dL — ABNORMAL HIGH (ref 0.50–0.99)
GLUCOSE: 112 mg/dL — AB (ref 65–99)
POTASSIUM: 4.7 mmol/L (ref 3.5–5.3)
Sodium: 141 mmol/L (ref 135–146)

## 2016-11-23 LAB — MICROALBUMIN, URINE: Microalb, Ur: 1.4 mg/dL

## 2016-11-23 LAB — HEMOGLOBIN A1C
HEMOGLOBIN A1C: 6 % — AB (ref <5.7)
Mean Plasma Glucose: 126 mg/dL

## 2016-11-23 LAB — TSH: TSH: 4.29 mIU/L

## 2016-12-14 NOTE — Progress Notes (Signed)
   Subjective:    Patient ID: [name], female    DOB: [birth year], 66 y.o.   MRN: [id number]  HPI 66 year old Female in today for recheck on multiple medical issues including essential hypertension, hypothyroidism, morbid obesity, control tight 2 diabetes mellitus. So far she is enjoying being retired. Her health is been stable.  History of asthmatic bronchitis but no recent episode. Takes Singulair for allergic rhinitis symptoms. Takes Xanax for anxiety. Is on thyroid replacement therapy. Takes Mobicox for musculoskeletal pain. Takes Ambien to sleep and trazodone for depression. Nexium scribe for GE reflux. Also takes Lexapro 10 mg daily for depression. Blood pressure control with low-dose Cozaar 50 mg daily and metoprolol as well as Lasix. Lasix treats dependent edema.  No new complaints   Needs some prescriptions refilled   Review of Systems see above     Objective:   Physical Exam  Skin warm and dry. Nodes none. Neck is supple without JVD thyromegaly or carotid bruits. Chest clear. Cardiac exam regular rate and rhythm normal S1 and S2. Trace lower extremity pitting edema      Assessment & Plan:  Essential hypertension-blood pressure elevated bit today at 150/90. She should get home blood pressure monitor and watch it at home and let me know if persistently elevated  Hyperlipidemia  Hypothyroidism  Dependent edema  Asthmatic bronchitis and allergic rhinitis  Osteoarthritis  History of lichen planus  Insomnia  Anxiety  History of depression  Morbid obesity  History of colitis-does not appear to be active at this point in time  Musculoskeletal pain treated with Mobicox and when necessary hydrocodone/APAP  Plan: Continue same medications and return in 6 months.

## 2016-12-14 NOTE — Patient Instructions (Signed)
It was a pleasure to see you today.  Continue same medications and return in 6 months. 

## 2016-12-15 ENCOUNTER — Ambulatory Visit
Admission: RE | Admit: 2016-12-15 | Discharge: 2016-12-15 | Disposition: A | Discharge status: Home / Self Care | Payer: BLUE CROSS/BLUE SHIELD | Source: Ambulatory Visit | Attending: Internal Medicine | Admitting: Internal Medicine

## 2016-12-15 DIAGNOSIS — Z1231 Encounter for screening mammogram for malignant neoplasm of breast: Secondary | ICD-10-CM

## 2016-12-27 ENCOUNTER — Encounter: Payer: Self-pay | Admitting: Internal Medicine

## 2016-12-27 ENCOUNTER — Ambulatory Visit (INDEPENDENT_AMBULATORY_CARE_PROVIDER_SITE_OTHER): Payer: BLUE CROSS/BLUE SHIELD | Admitting: Internal Medicine

## 2016-12-27 ENCOUNTER — Ambulatory Visit
Admission: RE | Admit: 2016-12-27 | Discharge: 2016-12-27 | Disposition: A | Discharge status: Home / Self Care | Payer: BLUE CROSS/BLUE SHIELD | Source: Ambulatory Visit | Attending: Internal Medicine | Admitting: Internal Medicine

## 2016-12-27 VITALS — BP 150/90 | HR 67 | Temp 98.2°F | Wt 295.0 lb

## 2016-12-27 DIAGNOSIS — R0602 Shortness of breath: Secondary | ICD-10-CM | POA: Diagnosis not present

## 2016-12-27 DIAGNOSIS — E039 Hypothyroidism, unspecified: Secondary | ICD-10-CM | POA: Diagnosis not present

## 2016-12-27 DIAGNOSIS — E784 Other hyperlipidemia: Secondary | ICD-10-CM

## 2016-12-27 DIAGNOSIS — R7302 Impaired glucose tolerance (oral): Secondary | ICD-10-CM

## 2016-12-27 DIAGNOSIS — Z8719 Personal history of other diseases of the digestive system: Secondary | ICD-10-CM | POA: Diagnosis not present

## 2016-12-27 DIAGNOSIS — I10 Essential (primary) hypertension: Secondary | ICD-10-CM | POA: Diagnosis not present

## 2016-12-27 DIAGNOSIS — E7849 Other hyperlipidemia: Secondary | ICD-10-CM

## 2016-12-27 LAB — CBC WITH DIFFERENTIAL/PLATELET
BASOS ABS: 87 {cells}/uL (ref 0–200)
Basophils Relative: 1 %
EOS ABS: 522 {cells}/uL — AB (ref 15–500)
Eosinophils Relative: 6 %
HCT: 40 % (ref 35.0–45.0)
HEMOGLOBIN: 12.6 g/dL (ref 11.7–15.5)
Lymphocytes Relative: 33 %
Lymphs Abs: 2871 cells/uL (ref 850–3900)
MCH: 26.6 pg — AB (ref 27.0–33.0)
MCHC: 31.5 g/dL — AB (ref 32.0–36.0)
MCV: 84.4 fL (ref 80.0–100.0)
MPV: 10.5 fL (ref 7.5–12.5)
Monocytes Absolute: 783 cells/uL (ref 200–950)
Monocytes Relative: 9 %
NEUTROS ABS: 4437 {cells}/uL (ref 1500–7800)
NEUTROS PCT: 51 %
Platelets: 282 10*3/uL (ref 140–400)
RBC: 4.74 MIL/uL (ref 3.80–5.10)
RDW: 14.7 % (ref 11.0–15.0)
WBC: 8.7 10*3/uL (ref 3.8–10.8)

## 2016-12-27 LAB — TSH: TSH: 1.83 mIU/L

## 2016-12-27 LAB — COMPREHENSIVE METABOLIC PANEL
ALK PHOS: 78 U/L (ref 33–130)
ALT: 15 U/L (ref 6–29)
AST: 16 U/L (ref 10–35)
Albumin: 3.6 g/dL (ref 3.6–5.1)
BUN: 14 mg/dL (ref 7–25)
CALCIUM: 9 mg/dL (ref 8.6–10.4)
CO2: 28 mmol/L (ref 20–31)
Chloride: 106 mmol/L (ref 98–110)
Creat: 1.04 mg/dL — ABNORMAL HIGH (ref 0.50–0.99)
Glucose, Bld: 123 mg/dL — ABNORMAL HIGH (ref 65–99)
POTASSIUM: 4.3 mmol/L (ref 3.5–5.3)
Sodium: 142 mmol/L (ref 135–146)
TOTAL PROTEIN: 6.3 g/dL (ref 6.1–8.1)
Total Bilirubin: 0.3 mg/dL (ref 0.2–1.2)

## 2016-12-27 NOTE — Progress Notes (Signed)
   Subjective:    Patient ID: [name], female    DOB: 1951-09-14, 67 y.o.   MRN: [id number]  HPI 66 year old White female with morbid obesity and multiple medical issues in today complaining of shortness of breath. Patient has not been exercising since her retirement. Has noticed that she gets very short of breath with minimal activity. Also gets quite fatigued. She is accompanied by her sister today who confirms that she was short of breath walking to a restaurant from a car and it was only a short distance recently. I did walk her around the office today and O2 saturation fell to 95% while walking.  She has no chest pain just shortness of breath.  Cardiologist is Dr. Pernell Dupre.  She has history of GE reflux, depression, insomnia, ulcerative colitis, hypokalemia, history of migraine headaches and asthma. History of B-12 deficiency.  History of diverticulitis of the proximal descending colon status post laparoscopic assisted left colectomy June 2014  History of angioedema and lichen planus.  She has had positive allergy skin testing to dust mite and mold in 2009. She also has had spirometry in the remote past. She had positive response to albuterol with improvement in FEV1.  Family history: Father with history of stroke hypertension diabetes and COPD. Mother deceased with history of congestive heart failure and chronic kidney disease. Sister and son in good health.  Patient does not smoke.  Patient weighs 295 pounds.    Review of Systems see above     Objective:   Physical Exam Skin warm and dry. Nodes none. TMs are clear. Neck is supple  thyromegaly or carotid bruits. Chest clear to auscultation. Trace pitting edema cardiac exam regular rate and rhythm normal S1 and S2. I think there is JVD present.       Assessment & Plan:  ? Congestive heart failure  History of asthma  Shortness of breath/dyspnea  Morbid obesity  Hypertension  Diabetes  mellitus  Hypothyroidism  Hyperlipidemia  History of colitis  GE reflux  Plan: Patient should have pulmonary cardiology evaluations. She has not been able to have shoulder surgery. She was evaluated preoperatively by Dr. Pernell Dupre September 2017 but hasn't felt well enough to have the operation by Dr. Mardelle Matte. We have drawn a CBC today in addition to a TSH, BNP. She had labs in January as well with hemoglobin A1c being 6%. TSH was 4.29 . This was repeated today and result is now 1.83. BNP is very slightly elevated at 167.6 hemoglobin white blood cell count platelet count stable. Basic metabolic panel is within normal limits. Nonfasting glucose 112. Creatinine 1.00.  Arrange for cardiology and pulmonary evaluations.

## 2016-12-28 LAB — BRAIN NATRIURETIC PEPTIDE: Brain Natriuretic Peptide: 167.6 pg/mL — ABNORMAL HIGH (ref <100)

## 2016-12-28 NOTE — Progress Notes (Signed)
Cardiology Office Note    Date:  12/29/2016   ID:  [name], DOB [birth year], MRN [id number]  PCP:  Elby Showers, MD  Cardiologist:  Dr. [name]   CC: SOB  History of Present Illness:  [name] is a 66 y.o. female with a history of GE reflux, depression, morbid obesity, ulcerative colitis, hypokalemia, DMT2 (diet controlled), HTN and asthma who presents to clinic for evaluation of SOB.  He was seen by Dr. [name] in the office in 07/2016 for pre op evaluation prior to knee surgery given an abnormal ECG with transient TWIs. Dr. [name] felt no further work up was neccessary and she was cleared for surgery. Never got knee surgery done because [employer] ran out and waiting for Medicare.   Today she presents to clinic for evaluation of SOB. She states she had had some excessive SOB recently. Has no stamina and can't stand up and even wash dishes due to dyspnea. Has been very inactive this winter because of cold and it makes her knees worse. Walking from room to room in the house makes her SOB. She has to sit down and rest. No associated chest tightness or pressure. Mild RLE edema which is chronic from her bad knee. Has some random premature beats but no tachycardia or bradycardia (retired Public house manager). Recently ambulatory 02 showed desaturations into low 90s with activities. No orthopnea or PND. No dizziness or syncope. No recent illnesses.   Has a mild cough. Recent CXR clear. BNP mildly elevated at 167. Has been using her inhaler more which hasn't helped much. No recent long travel or history of blood clots in legs.   Her mother had CVA, CAD and heart failure and PPM placement.   Past Medical History:  Diagnosis Date  . Anemia    none recently  . Anxiety   . Arthritis   . Asthma   . Complete tear of right rotator cuff 12/05/2014  . Depression   . Diabetes mellitus    not on meds.denies a1c 5.8  . Diverticulitis   . Diverticulosis   . Family history of adverse reaction  to anesthesia    mom  nausea and vomiting  . GERD (gastroesophageal reflux disease)   . Hypertension   . Hypothyroidism   . Migraine   . Personal history of kidney stones   . Pneumonia    hx  . Shortness of breath dyspnea    exersion  . Thyroid disease    hypo  . Wears glasses     Past Surgical History:  Procedure Laterality Date  . ABDOMINAL HYSTERECTOMY    . CATARACT EXTRACTION, BILATERAL    . CESAREAN SECTION    . COLONOSCOPY     multiple   . ESOPHAGOGASTRODUODENOSCOPY ENDOSCOPY     multiple  . KNEE CARTILAGE SURGERY     Left  . LAPAROSCOPIC PARTIAL COLECTOMY N/A 04/22/2013   Procedure: LAPAROSCOPIC PARTIAL COLECTOMY;  Surgeon: Odis Hollingshead, MD;  Location: [facility];  Service: General;  Laterality: N/A;  . right foot surgery  little toe and next toe   x 3  . SHOULDER ARTHROSCOPY WITH ROTATOR CUFF REPAIR AND SUBACROMIAL DECOMPRESSION Right 12/05/2014   Procedure: RIGHT SHOULDER ARTHROSCOPY,ACROMOPLASTY, ROTATOR CUFF REPAIR;  Surgeon: [name], MD;  Location: [facility];  Service: Orthopedics;  Laterality: Right;    Current Medications: Outpatient Medications Prior to Visit  Medication Sig Dispense Refill  . ALPRAZolam (XANAX) 0.5 MG tablet Take 1 tablet (  0.5 mg total) by mouth 2 (two) times daily as needed. 180 tablet 3  . aspirin 81 MG tablet Take 81 mg by mouth daily.      Marland Kitchen aspirin-acetaminophen-caffeine (EXCEDRIN MIGRAINE) 250-250-65 MG per tablet Take 1 tablet by mouth every 6 (six) hours as needed for headache. For pain/headache    . cetirizine (ZYRTEC) 10 MG tablet Take 10 mg by mouth at bedtime.     . Cholecalciferol (VITAMIN D3) 5000 units CAPS Take 5,000 Units by mouth daily.    . clobetasol ointment (TEMOVATE) [id number] % Apply 1 application topically 2 (two) times daily. 30 g 0  . EPINEPHRINE 0.3 mg/0.3 mL IJ SOAJ injection INJECT 0.3 MLS INTO THE MUSCLE ONCE. 2 Device PRN  . escitalopram (LEXAPRO) 10 MG tablet Take 1 tablet (10 mg total) by  mouth daily. 90 tablet 3  . esomeprazole (NEXIUM) 40 MG capsule TAKE 1 CAPSULE BY MOUTH TWICE DAILY 180 capsule 3  . famotidine (PEPCID) 10 MG tablet Take 10 mg by mouth at bedtime.    . furosemide (LASIX) 40 MG tablet Take 20 mg by mouth daily.    Marland Kitchen HYDROcodone-acetaminophen (NORCO/VICODIN) 5-325 MG tablet Take 1 tablet by mouth every 8 (eight) hours as needed for moderate pain.    Marland Kitchen levothyroxine (SYNTHROID, LEVOTHROID) 200 MCG tablet TAKE 1 TABLET BY MOUTH DAILY BEFORE BREAKFAST. 90 tablet 3  . losartan (COZAAR) 50 MG tablet TAKE 1 TABLET BY MOUTH ONCE DAILY FOR HYPERTENSION 90 tablet 3  . metoprolol tartrate (LOPRESSOR) 25 MG tablet Take 1 tablet (25 mg total) by mouth every morning. 90 tablet 3  . montelukast (SINGULAIR) 10 MG tablet Take 1 tablet (10 mg total) by mouth at bedtime. 90 tablet 1  . ondansetron (ZOFRAN) 4 MG tablet Take 1 tablet (4 mg total) by mouth every 8 (eight) hours as needed for nausea or vomiting. 30 tablet 0  . OSCIMIN SR 0.375 MG 12 hr tablet Take 0.357 mg by mouth daily.  3  . PRESCRIPTION MEDICATION Take 600 mg by mouth at bedtime. Med Name: ZYFEO    . traZODone (DESYREL) 150 MG tablet Take 1 tablet (150 mg total) by mouth at bedtime. 90 tablet 3  . VENTOLIN HFA 108 (90 Base) MCG/ACT inhaler INHALE 2 PUFFS INTO THE LUNGS EVERY 6 HOURS AS NEEDED FOR WHEEZING. 18 g 11  . zolpidem (AMBIEN) 10 MG tablet TAKE 1 TABLET BY MOUTH ONCE DAILY AT BEDTIME AS NEEDED 90 tablet 3  . meloxicam (MOBIC) 15 MG tablet Take 15 mg by mouth daily as needed. (muscle spasms)  5   No facility-administered medications prior to visit.      Allergies:   Levaquin [levofloxacin]; Lisinopril; Bee venom; and Morphine and related   Social History   Social History  . Marital status: Single    Spouse name: N/A  . Number of children: N/A  . Years of education: N/A   Social History Main Topics  . Smoking status: Former Smoker    Packs/day: 0.50    Years: 6.00    Types: Cigarettes    Quit  date: 11/14/1982  . Smokeless tobacco: Never Used     Comment: quit smoking 30 years ago  . Alcohol use Yes     Comment:  very rare   . Drug use: No  . Sexual activity: Not Asked   Other Topics Concern  . None   Social History Narrative  . None     Family History:  The patient's family history  includes Breast cancer in her paternal aunt; Diabetes in her father; Heart disease in her mother; Kidney failure in her father.      ROS:   Please see the history of present illness.    ROS All other systems reviewed and are negative.   PHYSICAL EXAM:   VS:  BP 136/86   Pulse 62   Ht 5\' 5"  (1.651 m)   Wt (!) 309 lb 12.8 oz (140.5 kg)   SpO2 90%   BMI 51.55 kg/m    GEN: Well nourished, well developed, in no acute distress, morbidly obese HEENT: normal  Neck: no JVD, carotid bruits, or masses Cardiac: RRR; no murmurs, rubs, or gallops trace right LE edema.  Respiratory:  clear to auscultation bilaterally, normal work of breathing GI: soft, nontender, nondistended, + BS MS: no deformity or atrophy  Skin: warm and dry, no rash Neuro:  Alert and Oriented x 3, Strength and sensation are intact Psych: euthymic mood, full affect    Wt Readings from Last 3 Encounters:  12/29/16 (!) 309 lb 12.8 oz (140.5 kg)  12/27/16 295 lb (133.8 kg)  11/22/16 290 lb (131.5 kg)      Studies/Labs Reviewed:   EKG:  EKG is ordered today.  The ekg ordered today demonstrates sinus bradycardia HR 57. First degree AV block   Recent Labs: 12/27/2016: ALT 15; Brain Natriuretic Peptide 167.6; BUN 14; Creat 1.04; Hemoglobin 12.6; Platelets 282; Potassium 4.3; Sodium 142; TSH 1.83   Lipid Panel    Component Value Date/Time   CHOL 192 05/25/2016 1213   TRIG 194 (H) 05/25/2016 1213   HDL 43 (L) 05/25/2016 1213   CHOLHDL 4.5 05/25/2016 1213   VLDL 39 (H) 05/25/2016 1213   LDLCALC 110 05/25/2016 1213    Additional studies/ records that were reviewed today include:  none   ASSESSMENT & PLAN:    SOB: this has become progressive to the point that she cant go to grocery store or was across the house without significant SOB. She is morbidly obese and certainly deconditioned. Will start with 2D ECHO and and myoview (which will have to be a 2 day). Not tachycardic. Doubt PE but this is certainly on differential. Was going to check a DDimer but she asked me to not to because this has been positive for her in the past. Recent BNP was midlly elevated but can be falsely low in the setting of obesity. Will have her increase lasix from 20mg  daily to 40mg  daily.   HTN: BP well controlled currently.   HLD: continue statin   Morbid obesity: Body mass index is 51.55 kg/m.  Medication Adjustments/Labs and Tests Ordered: Current medicines are reviewed at length with the patient today.  Concerns regarding medicines are outlined above.  Medication changes, Labs and Tests ordered today are listed in the Patient Instructions below. Patient Instructions  Medication Instructions:  Your physician recommends that you continue on your current medications as directed. Please refer to the Current Medication list given to you today.   Labwork: None ordered  Testing/Procedures: None ordered  Follow-Up: Your physician recommends that you schedule a follow-up appointment in:    Any Other Special Instructions Will Be Listed Below (If Applicable).   If you need a refill on your cardiac medications before your next appointment, please call your pharmacy.      Mable Fill, PA-C  12/29/2016 11:55 AM    [facility] [place], [place], [place]  [address] Phone: [phone])  [phone]; Fax: [phone]    

## 2016-12-29 ENCOUNTER — Encounter: Payer: Self-pay | Admitting: Physician Assistant

## 2016-12-29 ENCOUNTER — Ambulatory Visit: Payer: BLUE CROSS/BLUE SHIELD | Admitting: Physician Assistant

## 2016-12-29 ENCOUNTER — Ambulatory Visit (INDEPENDENT_AMBULATORY_CARE_PROVIDER_SITE_OTHER): Payer: BLUE CROSS/BLUE SHIELD | Admitting: Physician Assistant

## 2016-12-29 VITALS — BP 136/86 | HR 62 | Ht 65.0 in | Wt 309.8 lb

## 2016-12-29 DIAGNOSIS — E785 Hyperlipidemia, unspecified: Secondary | ICD-10-CM | POA: Diagnosis not present

## 2016-12-29 DIAGNOSIS — R0602 Shortness of breath: Secondary | ICD-10-CM

## 2016-12-29 DIAGNOSIS — I10 Essential (primary) hypertension: Secondary | ICD-10-CM | POA: Diagnosis not present

## 2016-12-29 MED ORDER — FUROSEMIDE 20 MG PO TABS: 20.0000 mg | ORAL_TABLET | Freq: Every day | ORAL | 3 refills | Status: DC

## 2016-12-29 NOTE — Patient Instructions (Addendum)
Medication Instructions:  Your physician recommends that you continue on your current medications as directed. Please refer to the Current Medication list given to you today. CALL ME WHEN YOU GET HOME TO LET ME KNOW THE DOSAGE OF THE LASIX SO WE CAN INCREASE IT.  RN:[id number] ASK FOR [place]: None ordered  Testing/Procedures: Your physician has requested that you have a lexiscan myoview. For further information please visit [email or web address]. Please follow instruction sheet, as given.   Your physician has requested that you have an echocardiogram. Echocardiography is a painless test that uses sound waves to create images of your heart. It provides your doctor with information about the size and shape of your heart and how well your heart's chambers and valves are working. This procedure takes approximately one hour. There are no restrictions for this procedure.   Follow-Up: Your physician recommends that you schedule a follow-up appointment in: DEPENDING UPON THE TEST RESULTS   Any Other Special Instructions Will Be Listed Below (If Applicable). Pharmacologic Stress Electrocardiogram A pharmacologic stress electrocardiogram is a heart (cardiac) test that uses nuclear imaging to evaluate the blood supply to your heart. This test may also be called a pharmacologic stress electrocardiography. Pharmacologic means that a medicine is used to increase your heart rate and blood pressure.  This stress test is done to find areas of poor blood flow to the heart by determining the extent of coronary artery disease (CAD). Some people exercise on a treadmill, which naturally increases the blood flow to the heart. For those people unable to exercise on a treadmill, a medicine is used. This medicine stimulates your heart and will cause your heart to beat harder and more quickly, as if you were exercising.  Pharmacologic stress tests can help determine:  The adequacy of blood flow to your  heart during increased levels of activity in order to clear you for discharge home.  The extent of coronary artery blockage caused by CAD.  Your prognosis if you have suffered a heart attack.  The effectiveness of cardiac procedures done, such as an angioplasty, which can increase the circulation in your coronary arteries.  Causes of chest pain or pressure. LET [facility] CARE PROVIDER KNOW ABOUT:  Any allergies you have.  All medicines you are taking, including vitamins, herbs, eye drops, creams, and over-the-counter medicines.  Previous problems you or members of your family have had with the use of anesthetics.  Any blood disorders you have.  Previous surgeries you have had.  Medical conditions you have.  Possibility of pregnancy, if this applies.  If you are currently breastfeeding. RISKS AND COMPLICATIONS Generally, this is a safe procedure. However, as with any procedure, complications can occur. Possible complications include:  You develop pain or pressure in the following areas:  Chest.  Jaw or neck.  Between your shoulder blades.  Radiating down your left arm.  Headache.  Dizziness or light-headedness.  Shortness of breath.  Increased or irregular heartbeat.  Low blood pressure.  Nausea or vomiting.  Flushing.  Redness going up the arm and slight pain during injection of medicine.  Heart attack (rare). BEFORE THE PROCEDURE   Avoid all forms of caffeine for 24 hours before your test or as directed by your health care provider. This includes coffee, tea (even decaffeinated tea), caffeinated sodas, chocolate, cocoa, and certain pain medicines.  Follow your health care provider's instructions regarding eating and drinking before the test.  Take your medicines as directed at regular times with water unless  instructed otherwise. Exceptions may include:  If you have diabetes, ask how you are to take your insulin or pills. It is common to adjust  insulin dosing the morning of the test.  If you are taking beta-blocker medicines, it is important to talk to your health care provider about these medicines well before the date of your test. Taking beta-blocker medicines may interfere with the test. In some cases, these medicines need to be changed or stopped 24 hours or more before the test.  If you wear a nitroglycerin patch, it may need to be removed prior to the test. Ask your health care provider if the patch should be removed before the test.  If you use an inhaler for any breathing condition, bring it with you to the test.  If you are an outpatient, bring a snack so you can eat right after the stress phase of the test.  Do not smoke for 4 hours prior to the test or as directed by your health care provider.  Do not apply lotions, powders, creams, or oils on your chest prior to the test.  Wear comfortable shoes and clothing. Let your health care provider know if you were unable to complete or follow the preparations for your test. PROCEDURE   Multiple patches (electrodes) will be put on your chest. If needed, small areas of your chest may be shaved to get better contact with the electrodes. Once the electrodes are attached to your body, multiple wires will be attached to the electrodes, and your heart rate will be monitored.  An IV access will be started. A nuclear trace (isotope) is given. The isotope may be given intravenously, or it may be swallowed. Nuclear refers to several types of radioactive isotopes, and the nuclear isotope lights up the arteries so that the nuclear images are clear. The isotope is absorbed by your body. This results in low radiation exposure.  A resting nuclear image is taken to show how your heart functions at rest.  A medicine is given through the IV access.  A second scan is done about 1 hour after the medicine injection and determines how your heart functions under stress.  During this stress phase,  you will be connected to an electrocardiogram machine. Your blood pressure and oxygen levels will be monitored. AFTER THE PROCEDURE   Your heart rate and blood pressure will be monitored after the test.  You may return to your normal schedule, including diet,activities, and medicines, unless your health care provider tells you otherwise. This information is not intended to replace advice given to you by your health care provider. Make sure you discuss any questions you have with your health care provider. Document Released: 03/19/2009 Document Revised: 11/05/2013 Document Reviewed: 07/08/2013 Elsevier Interactive Patient Education  2017 [place].   Echocardiogram An echocardiogram, or echocardiography, uses sound waves (ultrasound) to produce an image of your heart. The echocardiogram is simple, painless, obtained within a short period of time, and offers valuable information to your health care provider. The images from an echocardiogram can provide information such as:  Evidence of coronary artery disease (CAD).  Heart size.  Heart muscle function.  Heart valve function.  Aneurysm detection.  Evidence of a past heart attack.  Fluid buildup around the heart.  Heart muscle thickening.  Assess heart valve function. Tell a health care provider about:  Any allergies you have.  All medicines you are taking, including vitamins, herbs, eye drops, creams, and over-the-counter medicines.  Any problems you  or family members have had with anesthetic medicines.  Any blood disorders you have.  Any surgeries you have had.  Any medical conditions you have.  Whether you are pregnant or may be pregnant. What happens before the procedure? No special preparation is needed. Eat and drink normally. What happens during the procedure?  In order to produce an image of your heart, gel will be applied to your chest and a wand-like tool (transducer) will be moved over your chest. The  gel will help transmit the sound waves from the transducer. The sound waves will harmlessly bounce off your heart to allow the heart images to be captured in real-time motion. These images will then be recorded.  You may need an IV to receive a medicine that improves the quality of the pictures. What happens after the procedure? You may return to your normal schedule including diet, activities, and medicines, unless your health care provider tells you otherwise. This information is not intended to replace advice given to you by your health care provider. Make sure you discuss any questions you have with your health care provider. Document Released: 10/28/2000 Document Revised: 06/18/2016 Document Reviewed: 07/08/2013 Elsevier Interactive Patient Education  2017 [employer].   If you need a refill on your cardiac medications before your next appointment, please call your pharmacy.

## 2017-01-11 ENCOUNTER — Ambulatory Visit: Payer: BLUE CROSS/BLUE SHIELD | Admitting: Physician Assistant

## 2017-01-11 NOTE — Patient Instructions (Signed)
It was pleasure to see you today. We will arrange for cardiology and pulmonary consultations. Shortness of breath needs further evaluation.

## 2017-01-16 ENCOUNTER — Telehealth (HOSPITAL_COMMUNITY): Payer: Self-pay | Admitting: *Deleted

## 2017-01-16 NOTE — Telephone Encounter (Signed)
Patient given detailed instructions per Myocardial Perfusion Study Information Sheet for the test on 01/18/17 at 1230. Patient notified to arrive 15 minutes early and that it is imperative to arrive on time for appointment to keep from having the test rescheduled.  If you need to cancel or reschedule your appointment, please call the office within 24 hours of your appointment. Failure to do so may result in a cancellation of your appointment, and a $50 no show fee. Patient verbalized understanding.Lannie Yusuf, Ranae Palms

## 2017-01-17 ENCOUNTER — Institutional Professional Consult (permissible substitution): Payer: BLUE CROSS/BLUE SHIELD | Admitting: Internal Medicine

## 2017-01-18 ENCOUNTER — Ambulatory Visit (HOSPITAL_BASED_OUTPATIENT_CLINIC_OR_DEPARTMENT_OTHER): Payer: BLUE CROSS/BLUE SHIELD

## 2017-01-18 ENCOUNTER — Other Ambulatory Visit: Payer: Self-pay

## 2017-01-18 DIAGNOSIS — I361 Nonrheumatic tricuspid (valve) insufficiency: Secondary | ICD-10-CM

## 2017-01-18 DIAGNOSIS — Z6841 Body Mass Index (BMI) 40.0 and over, adult: Secondary | ICD-10-CM | POA: Insufficient documentation

## 2017-01-18 DIAGNOSIS — I119 Hypertensive heart disease without heart failure: Secondary | ICD-10-CM

## 2017-01-18 DIAGNOSIS — E119 Type 2 diabetes mellitus without complications: Secondary | ICD-10-CM | POA: Insufficient documentation

## 2017-01-18 DIAGNOSIS — R0602 Shortness of breath: Secondary | ICD-10-CM

## 2017-01-18 DIAGNOSIS — E785 Hyperlipidemia, unspecified: Secondary | ICD-10-CM

## 2017-01-18 DIAGNOSIS — I272 Pulmonary hypertension, unspecified: Secondary | ICD-10-CM

## 2017-01-18 DIAGNOSIS — Z87891 Personal history of nicotine dependence: Secondary | ICD-10-CM

## 2017-01-18 LAB — ECHOCARDIOGRAM COMPLETE
Height: 65 in
Weight: 4944 oz

## 2017-01-18 MED ORDER — REGADENOSON 0.4 MG/5ML IV SOLN
0.4000 mg | Freq: Once | INTRAVENOUS | Status: AC
  Administered 2017-01-18: 0.4 mg via INTRAVENOUS

## 2017-01-18 MED ORDER — TECHNETIUM TC 99M TETROFOSMIN IV KIT
33.0000 | PACK | Freq: Once | INTRAVENOUS | Status: AC | PRN
  Administered 2017-01-18: 33 via INTRAVENOUS
  Filled 2017-01-18: qty 33

## 2017-01-18 MED ORDER — PERFLUTREN LIPID MICROSPHERE
2.0000 mL | INTRAVENOUS | Status: AC | PRN
  Administered 2017-01-18: 2 mL via INTRAVENOUS

## 2017-01-19 ENCOUNTER — Ambulatory Visit (HOSPITAL_COMMUNITY): Payer: BLUE CROSS/BLUE SHIELD

## 2017-01-19 ENCOUNTER — Encounter: Payer: Self-pay | Admitting: Interventional Cardiology

## 2017-01-19 ENCOUNTER — Ambulatory Visit (INDEPENDENT_AMBULATORY_CARE_PROVIDER_SITE_OTHER)
Admission: RE | Admit: 2017-01-19 | Discharge: 2017-01-19 | Disposition: A | Discharge status: Home / Self Care | Payer: BLUE CROSS/BLUE SHIELD | Source: Ambulatory Visit | Attending: Physician Assistant | Admitting: Physician Assistant

## 2017-01-19 ENCOUNTER — Telehealth: Payer: Self-pay

## 2017-01-19 ENCOUNTER — Inpatient Hospital Stay (HOSPITAL_COMMUNITY)
Admission: AD | Admit: 2017-01-19 | Discharge: 2017-01-22 | DRG: 175 | Disposition: A | Discharge status: Home / Self Care | Payer: BLUE CROSS/BLUE SHIELD | Source: Ambulatory Visit | Attending: Interventional Cardiology | Admitting: Interventional Cardiology

## 2017-01-19 ENCOUNTER — Ambulatory Visit (INDEPENDENT_AMBULATORY_CARE_PROVIDER_SITE_OTHER): Payer: BLUE CROSS/BLUE SHIELD | Admitting: Interventional Cardiology

## 2017-01-19 ENCOUNTER — Telehealth: Payer: Self-pay | Admitting: *Deleted

## 2017-01-19 DIAGNOSIS — E039 Hypothyroidism, unspecified: Secondary | ICD-10-CM | POA: Diagnosis present

## 2017-01-19 DIAGNOSIS — I517 Cardiomegaly: Secondary | ICD-10-CM | POA: Diagnosis not present

## 2017-01-19 DIAGNOSIS — K5190 Ulcerative colitis, unspecified, without complications: Secondary | ICD-10-CM | POA: Diagnosis present

## 2017-01-19 DIAGNOSIS — Z6841 Body Mass Index (BMI) 40.0 and over, adult: Secondary | ICD-10-CM

## 2017-01-19 DIAGNOSIS — Z8719 Personal history of other diseases of the digestive system: Secondary | ICD-10-CM

## 2017-01-19 DIAGNOSIS — E785 Hyperlipidemia, unspecified: Secondary | ICD-10-CM | POA: Diagnosis present

## 2017-01-19 DIAGNOSIS — M1711 Unilateral primary osteoarthritis, right knee: Secondary | ICD-10-CM | POA: Diagnosis present

## 2017-01-19 DIAGNOSIS — Z87442 Personal history of urinary calculi: Secondary | ICD-10-CM

## 2017-01-19 DIAGNOSIS — I2721 Secondary pulmonary arterial hypertension: Secondary | ICD-10-CM | POA: Diagnosis present

## 2017-01-19 DIAGNOSIS — K219 Gastro-esophageal reflux disease without esophagitis: Secondary | ICD-10-CM | POA: Diagnosis present

## 2017-01-19 DIAGNOSIS — R0602 Shortness of breath: Secondary | ICD-10-CM

## 2017-01-19 DIAGNOSIS — Z7901 Long term (current) use of anticoagulants: Secondary | ICD-10-CM | POA: Diagnosis not present

## 2017-01-19 DIAGNOSIS — Z7982 Long term (current) use of aspirin: Secondary | ICD-10-CM

## 2017-01-19 DIAGNOSIS — F329 Major depressive disorder, single episode, unspecified: Secondary | ICD-10-CM | POA: Diagnosis present

## 2017-01-19 DIAGNOSIS — Z841 Family history of disorders of kidney and ureter: Secondary | ICD-10-CM | POA: Diagnosis not present

## 2017-01-19 DIAGNOSIS — I10 Essential (primary) hypertension: Secondary | ICD-10-CM | POA: Diagnosis present

## 2017-01-19 DIAGNOSIS — Z8701 Personal history of pneumonia (recurrent): Secondary | ICD-10-CM

## 2017-01-19 DIAGNOSIS — Z5181 Encounter for therapeutic drug level monitoring: Secondary | ICD-10-CM | POA: Diagnosis not present

## 2017-01-19 DIAGNOSIS — Z8249 Family history of ischemic heart disease and other diseases of the circulatory system: Secondary | ICD-10-CM

## 2017-01-19 DIAGNOSIS — Z87891 Personal history of nicotine dependence: Secondary | ICD-10-CM | POA: Diagnosis not present

## 2017-01-19 DIAGNOSIS — Z86711 Personal history of pulmonary embolism: Secondary | ICD-10-CM | POA: Diagnosis not present

## 2017-01-19 DIAGNOSIS — I2609 Other pulmonary embolism with acute cor pulmonale: Secondary | ICD-10-CM | POA: Diagnosis not present

## 2017-01-19 DIAGNOSIS — Z885 Allergy status to narcotic agent status: Secondary | ICD-10-CM

## 2017-01-19 DIAGNOSIS — E1121 Type 2 diabetes mellitus with diabetic nephropathy: Secondary | ICD-10-CM | POA: Diagnosis not present

## 2017-01-19 DIAGNOSIS — F419 Anxiety disorder, unspecified: Secondary | ICD-10-CM | POA: Diagnosis present

## 2017-01-19 DIAGNOSIS — I272 Pulmonary hypertension, unspecified: Secondary | ICD-10-CM | POA: Diagnosis not present

## 2017-01-19 DIAGNOSIS — I2602 Saddle embolus of pulmonary artery with acute cor pulmonale: Secondary | ICD-10-CM | POA: Diagnosis present

## 2017-01-19 DIAGNOSIS — Z79899 Other long term (current) drug therapy: Secondary | ICD-10-CM

## 2017-01-19 DIAGNOSIS — E876 Hypokalemia: Secondary | ICD-10-CM | POA: Diagnosis present

## 2017-01-19 DIAGNOSIS — Z90710 Acquired absence of both cervix and uterus: Secondary | ICD-10-CM

## 2017-01-19 DIAGNOSIS — I2601 Septic pulmonary embolism with acute cor pulmonale: Secondary | ICD-10-CM | POA: Diagnosis not present

## 2017-01-19 DIAGNOSIS — E119 Type 2 diabetes mellitus without complications: Secondary | ICD-10-CM | POA: Diagnosis present

## 2017-01-19 DIAGNOSIS — E7800 Pure hypercholesterolemia, unspecified: Secondary | ICD-10-CM

## 2017-01-19 DIAGNOSIS — Z833 Family history of diabetes mellitus: Secondary | ICD-10-CM

## 2017-01-19 DIAGNOSIS — E7849 Other hyperlipidemia: Secondary | ICD-10-CM | POA: Diagnosis present

## 2017-01-19 DIAGNOSIS — Z881 Allergy status to other antibiotic agents status: Secondary | ICD-10-CM

## 2017-01-19 DIAGNOSIS — Z91030 Bee allergy status: Secondary | ICD-10-CM

## 2017-01-19 DIAGNOSIS — J45909 Unspecified asthma, uncomplicated: Secondary | ICD-10-CM | POA: Diagnosis present

## 2017-01-19 DIAGNOSIS — Q742 Other congenital malformations of lower limb(s), including pelvic girdle: Secondary | ICD-10-CM

## 2017-01-19 DIAGNOSIS — M7989 Other specified soft tissue disorders: Secondary | ICD-10-CM | POA: Diagnosis present

## 2017-01-19 DIAGNOSIS — Z888 Allergy status to other drugs, medicaments and biological substances status: Secondary | ICD-10-CM

## 2017-01-19 HISTORY — DX: Other pulmonary embolism without acute cor pulmonale: I26.99

## 2017-01-19 LAB — COMPREHENSIVE METABOLIC PANEL
ALT: 18 U/L (ref 14–54)
AST: 26 U/L (ref 15–41)
Albumin: 3.4 g/dL — ABNORMAL LOW (ref 3.5–5.0)
Alkaline Phosphatase: 68 U/L (ref 38–126)
Anion gap: 12 (ref 5–15)
BILIRUBIN TOTAL: 0.4 mg/dL (ref 0.3–1.2)
BUN: 14 mg/dL (ref 6–20)
CALCIUM: 9.1 mg/dL (ref 8.9–10.3)
CO2: 26 mmol/L (ref 22–32)
CREATININE: 0.91 mg/dL (ref 0.44–1.00)
Chloride: 100 mmol/L — ABNORMAL LOW (ref 101–111)
GFR calc non Af Amer: >60 mL/min (ref >60)
Glucose, Bld: 143 mg/dL — ABNORMAL HIGH (ref 65–99)
Potassium: 3.6 mmol/L (ref 3.5–5.1)
Sodium: 138 mmol/L (ref 135–145)
TOTAL PROTEIN: 6.4 g/dL — AB (ref 6.5–8.1)

## 2017-01-19 LAB — CBC WITH DIFFERENTIAL/PLATELET
Basophils Absolute: 0.1 10*3/uL (ref 0.0–0.1)
Basophils Relative: 1 %
EOS ABS: 0.6 10*3/uL (ref 0.0–0.7)
EOS PCT: 5 %
HCT: 41.9 % (ref 36.0–46.0)
HEMOGLOBIN: 13.3 g/dL (ref 12.0–15.0)
LYMPHS ABS: 4.7 10*3/uL — AB (ref 0.7–4.0)
Lymphocytes Relative: 38 %
MCH: 27.1 pg (ref 26.0–34.0)
MCHC: 31.7 g/dL (ref 30.0–36.0)
MCV: 85.5 fL (ref 78.0–100.0)
MONO ABS: 0.9 10*3/uL (ref 0.1–1.0)
MONOS PCT: 7 %
Neutro Abs: 6.1 10*3/uL (ref 1.7–7.7)
Neutrophils Relative %: 49 %
Platelets: 280 10*3/uL (ref 150–400)
RBC: 4.9 MIL/uL (ref 3.87–5.11)
RDW: 14.2 % (ref 11.5–15.5)
WBC: 12.3 10*3/uL — ABNORMAL HIGH (ref 4.0–10.5)

## 2017-01-19 LAB — MYOCARDIAL PERFUSION IMAGING
CHL CUP RESTING HR STRESS: 72 {beats}/min
LV dias vol: 94 mL (ref 46–106)
LV sys vol: 29 mL
Peak HR: 87 {beats}/min
RATE: 0.34
SDS: 3
SRS: 5
SSS: 8
TID: 0.99

## 2017-01-19 LAB — TSH: TSH: 1.449 u[IU]/mL (ref 0.350–4.500)

## 2017-01-19 LAB — APTT: APTT: 91 s — AB (ref 24–36)

## 2017-01-19 LAB — GLUCOSE, CAPILLARY: Glucose-Capillary: 110 mg/dL — ABNORMAL HIGH (ref 65–99)

## 2017-01-19 LAB — OCCULT BLOOD X 1 CARD TO LAB, STOOL: Fecal Occult Bld: NEGATIVE

## 2017-01-19 LAB — PROTIME-INR
INR: 1.11
Prothrombin Time: 14.3 seconds (ref 11.4–15.2)

## 2017-01-19 LAB — T4, FREE: FREE T4: 0.95 ng/dL (ref 0.61–1.12)

## 2017-01-19 LAB — BRAIN NATRIURETIC PEPTIDE: B Natriuretic Peptide: 16.3 pg/mL (ref 0.0–100.0)

## 2017-01-19 LAB — MAGNESIUM: Magnesium: 1.5 mg/dL — ABNORMAL LOW (ref 1.7–2.4)

## 2017-01-19 MED ORDER — ONDANSETRON HCL 4 MG/2ML IJ SOLN: 4.0000 mg | Freq: Four times a day (QID) | INTRAMUSCULAR | Status: DC | PRN

## 2017-01-19 MED ORDER — IOPAMIDOL (ISOVUE-370) INJECTION 76%
80.0000 mL | Freq: Once | INTRAVENOUS | Status: AC | PRN
  Administered 2017-01-19: 80 mL via INTRAVENOUS

## 2017-01-19 MED ORDER — METOPROLOL TARTRATE 25 MG PO TABS
25.0000 mg | ORAL_TABLET | Freq: Every morning | ORAL | Status: DC
  Administered 2017-01-20 – 2017-01-22 (×3): 25 mg via ORAL
  Filled 2017-01-19 (×3): qty 1

## 2017-01-19 MED ORDER — MONTELUKAST SODIUM 10 MG PO TABS
10.0000 mg | ORAL_TABLET | Freq: Every day | ORAL | Status: DC
  Administered 2017-01-19 – 2017-01-21 (×3): 10 mg via ORAL
  Filled 2017-01-19 (×3): qty 1

## 2017-01-19 MED ORDER — HEPARIN BOLUS VIA INFUSION
5000.0000 [IU] | Freq: Once | INTRAVENOUS | Status: AC
Start: 2017-01-19 — End: 2017-01-19
  Administered 2017-01-19: 5000 [IU] via INTRAVENOUS
  Filled 2017-01-19: qty 5000

## 2017-01-19 MED ORDER — LORATADINE 10 MG PO TABS
10.0000 mg | ORAL_TABLET | Freq: Every day | ORAL | Status: DC
  Administered 2017-01-19 – 2017-01-22 (×4): 10 mg via ORAL
  Filled 2017-01-19 (×4): qty 1

## 2017-01-19 MED ORDER — ASPIRIN EC 81 MG PO TBEC
81.0000 mg | DELAYED_RELEASE_TABLET | Freq: Every day | ORAL | Status: DC
  Administered 2017-01-19 – 2017-01-22 (×4): 81 mg via ORAL
  Filled 2017-01-19 (×4): qty 1

## 2017-01-19 MED ORDER — HYDROCODONE-ACETAMINOPHEN 5-325 MG PO TABS: 1.0000 | ORAL_TABLET | Freq: Three times a day (TID) | ORAL | Status: DC | PRN

## 2017-01-19 MED ORDER — SODIUM CHLORIDE 0.9% FLUSH: 3.0000 mL | INTRAVENOUS | Status: DC | PRN

## 2017-01-19 MED ORDER — LOSARTAN POTASSIUM 50 MG PO TABS
50.0000 mg | ORAL_TABLET | Freq: Every day | ORAL | Status: DC
  Administered 2017-01-19 – 2017-01-22 (×4): 50 mg via ORAL
  Filled 2017-01-19 (×4): qty 1

## 2017-01-19 MED ORDER — INSULIN ASPART 100 UNIT/ML ~~LOC~~ SOLN
0.0000 [IU] | Freq: Three times a day (TID) | SUBCUTANEOUS | Status: DC
  Administered 2017-01-21 (×2): 1 [IU] via SUBCUTANEOUS

## 2017-01-19 MED ORDER — VITAMIN D 1000 UNITS PO TABS
5000.0000 [IU] | ORAL_TABLET | Freq: Every day | ORAL | Status: DC
  Administered 2017-01-19 – 2017-01-22 (×4): 5000 [IU] via ORAL
  Filled 2017-01-19 (×4): qty 5

## 2017-01-19 MED ORDER — ASPIRIN EC 81 MG PO TBEC: 81.0000 mg | DELAYED_RELEASE_TABLET | Freq: Every day | ORAL | Status: DC

## 2017-01-19 MED ORDER — NITROGLYCERIN 0.4 MG SL SUBL: 0.4000 mg | SUBLINGUAL_TABLET | SUBLINGUAL | Status: DC | PRN

## 2017-01-19 MED ORDER — SODIUM CHLORIDE 0.9 % IV SOLN
250.0000 mL | INTRAVENOUS | Status: DC | PRN
Start: 2017-01-19 — End: 2017-01-22

## 2017-01-19 MED ORDER — HEPARIN (PORCINE) IN NACL 100-0.45 UNIT/ML-% IJ SOLN
1600.0000 [IU]/h | INTRAMUSCULAR | Status: DC
  Administered 2017-01-19 – 2017-01-20 (×3): 1600 [IU]/h via INTRAVENOUS
  Filled 2017-01-19 (×3): qty 250

## 2017-01-19 MED ORDER — TRAZODONE HCL 150 MG PO TABS
150.0000 mg | ORAL_TABLET | Freq: Every day | ORAL | Status: DC
  Administered 2017-01-19 – 2017-01-21 (×3): 150 mg via ORAL
  Filled 2017-01-19 (×3): qty 1

## 2017-01-19 MED ORDER — FAMOTIDINE 20 MG PO TABS
10.0000 mg | ORAL_TABLET | Freq: Every day | ORAL | Status: DC
  Administered 2017-01-19 – 2017-01-21 (×3): 10 mg via ORAL
  Filled 2017-01-19 (×3): qty 1

## 2017-01-19 MED ORDER — SODIUM CHLORIDE 0.9% FLUSH
3.0000 mL | Freq: Two times a day (BID) | INTRAVENOUS | Status: DC
  Administered 2017-01-19 – 2017-01-22 (×4): 3 mL via INTRAVENOUS

## 2017-01-19 MED ORDER — ALPRAZOLAM 0.5 MG PO TABS
0.5000 mg | ORAL_TABLET | Freq: Two times a day (BID) | ORAL | Status: DC | PRN
Start: 2017-01-19 — End: 2017-01-22

## 2017-01-19 MED ORDER — ALBUTEROL SULFATE (2.5 MG/3ML) 0.083% IN NEBU: 2.5000 mg | INHALATION_SOLUTION | Freq: Four times a day (QID) | RESPIRATORY_TRACT | Status: DC | PRN

## 2017-01-19 MED ORDER — LEVOTHYROXINE SODIUM 100 MCG PO TABS
200.0000 ug | ORAL_TABLET | Freq: Every day | ORAL | Status: DC
  Administered 2017-01-20 – 2017-01-22 (×3): 200 ug via ORAL
  Filled 2017-01-19 (×3): qty 2

## 2017-01-19 MED ORDER — ZOLPIDEM TARTRATE 5 MG PO TABS
5.0000 mg | ORAL_TABLET | Freq: Every day | ORAL | Status: DC
  Administered 2017-01-19 – 2017-01-21 (×3): 5 mg via ORAL
  Filled 2017-01-19 (×3): qty 1

## 2017-01-19 MED ORDER — ESCITALOPRAM OXALATE 10 MG PO TABS
10.0000 mg | ORAL_TABLET | Freq: Every day | ORAL | Status: DC
  Administered 2017-01-19 – 2017-01-22 (×4): 10 mg via ORAL
  Filled 2017-01-19 (×4): qty 1

## 2017-01-19 MED ORDER — PANTOPRAZOLE SODIUM 40 MG PO TBEC
40.0000 mg | DELAYED_RELEASE_TABLET | Freq: Every day | ORAL | Status: DC
  Administered 2017-01-19 – 2017-01-22 (×4): 40 mg via ORAL
  Filled 2017-01-19 (×4): qty 1

## 2017-01-19 MED ORDER — PNEUMOCOCCAL VAC POLYVALENT 25 MCG/0.5ML IJ INJ
0.5000 mL | INJECTION | INTRAMUSCULAR | Status: DC
  Filled 2017-01-19: qty 0.5

## 2017-01-19 MED ORDER — ACETAMINOPHEN 325 MG PO TABS
650.0000 mg | ORAL_TABLET | ORAL | Status: DC | PRN
  Administered 2017-01-21 (×2): 650 mg via ORAL
  Filled 2017-01-19 (×2): qty 2

## 2017-01-19 MED ORDER — TECHNETIUM TC 99M TETROFOSMIN IV KIT
31.9000 | PACK | Freq: Once | INTRAVENOUS | Status: AC | PRN
  Administered 2017-01-19: 31.9 via INTRAVENOUS
  Filled 2017-01-19: qty 32

## 2017-01-19 NOTE — Telephone Encounter (Signed)
Patient is actually currently in the office. Dr. [name] is going to talk to patient.

## 2017-01-19 NOTE — Telephone Encounter (Signed)
Left a message for patient to call back. 

## 2017-01-19 NOTE — Progress Notes (Signed)
ANTICOAGULATION CONSULT NOTE - Initial Consult  Pharmacy Consult for heparin Indication: pulmonary embolus  Allergies  Allergen Reactions  . Levaquin [Levofloxacin] Anaphylaxis and Other (See Comments)    Possibly Levaquin or Lisinopril, pt was taking both at the time of reaction  . Lisinopril Anaphylaxis and Other (See Comments)    Possibly Lisinopril or Levaquin, pt was taking both at the time of reaction  . Bee Venom Swelling  . Morphine And Related Itching and Other (See Comments)    Needs benadryl prior to     Patient Measurements: Height: 5\' 5"  (165.1 cm) Weight: (!) 300 lb 8 oz (136.3 kg) IBW/kg (Calculated) : 57 Heparin Dosing Weight: 90.8 Kg  Vital Signs: Temp: 97.7 F (36.5 C) (03/08 1608) Temp Source: Oral (03/08 1608) BP: 138/64 (03/08 1608) Pulse Rate: 81 (03/08 1608)  Labs: No results for input(s): HGB, HCT, PLT, APTT, LABPROT, INR, HEPARINUNFRC, HEPRLOWMOCWT, CREATININE, CKTOTAL, CKMB, TROPONINI in the last 72 hours.  CrCl cannot be calculated (Patient's most recent lab result is older than the maximum 21 days allowed.).   Medical History: Past Medical History:  Diagnosis Date  . Anemia    none recently  . Anxiety   . Arthritis   . Asthma   . Complete tear of right rotator cuff 12/05/2014  . Depression   . Diabetes mellitus    not on meds.denies a1c 5.8  . Diverticulitis   . Diverticulosis   . Family history of adverse reaction to anesthesia    mom  nausea and vomiting  . GERD (gastroesophageal reflux disease)   . Hypertension   . Hypothyroidism   . Migraine   . Personal history of kidney stones   . Pneumonia    hx  . Shortness of breath dyspnea    exersion  . Thyroid disease    hypo  . Wears glasses     Medications:  Prescriptions Prior to Admission  Medication Sig Dispense Refill Last Dose  . ALPRAZolam (XANAX) 0.5 MG tablet Take 1 tablet (0.5 mg total) by mouth 2 (two) times daily as needed. (Patient taking differently: Take 0.5 mg  by mouth 2 (two) times daily as needed for anxiety. ) 180 tablet 3 01/18/2017 at Unknown time  . aspirin 81 MG tablet Take 81 mg by mouth daily.     Past Week at Unknown time  . aspirin-acetaminophen-caffeine (EXCEDRIN MIGRAINE) 250-250-65 MG per tablet Take 1 tablet by mouth every 6 (six) hours as needed for headache (or pain).    Unknown at Unknown  . cetirizine (ZYRTEC) 10 MG tablet Take 10 mg by mouth at bedtime.    01/18/2017 at Unknown time  . Cholecalciferol (VITAMIN D3) 5000 units CAPS Take 5,000 Units by mouth daily.   Past Week at Unknown time  . clobetasol ointment (TEMOVATE) 7.06 % Apply 1 application topically 2 (two) times daily. 30 g 0 Past Week at Unknown time  . escitalopram (LEXAPRO) 10 MG tablet Take 1 tablet (10 mg total) by mouth daily. 90 tablet 3 Past Week at Unknown time  . esomeprazole (NEXIUM) 40 MG capsule TAKE 1 CAPSULE BY MOUTH TWICE DAILY (Patient taking differently: TAKE 1 CAPSULE BY MOUTH DAILY) 180 capsule 3 01/18/2017 at Unknown time  . famotidine (PEPCID) 10 MG tablet Take 10 mg by mouth at bedtime.   01/18/2017 at Unknown time  . furosemide (LASIX) 20 MG tablet Take 1 tablet (20 mg total) by mouth daily. (Patient taking differently: Take 40 mg by mouth daily. ) 90 tablet  3 Past Week at Unknown time  . levothyroxine (SYNTHROID, LEVOTHROID) 200 MCG tablet TAKE 1 TABLET BY MOUTH DAILY BEFORE BREAKFAST. 90 tablet 3 Past Week at Unknown time  . losartan (COZAAR) 50 MG tablet TAKE 1 TABLET BY MOUTH ONCE DAILY FOR HYPERTENSION 90 tablet 3 Past Week at Unknown time  . metoprolol tartrate (LOPRESSOR) 25 MG tablet Take 1 tablet (25 mg total) by mouth every morning. 90 tablet 3 Past Week at Unknown time  . montelukast (SINGULAIR) 10 MG tablet Take 1 tablet (10 mg total) by mouth at bedtime. 90 tablet 1 01/18/2017 at Unknown time  . OSCIMIN SR 0.375 MG 12 hr tablet Take 0.357 mg by mouth daily.  3 Past Week at Unknown time  . PRESCRIPTION MEDICATION Take 600 mg by mouth at bedtime. Med  Name: Gwendel Hanson   01/18/2017 at Unknown time  . traZODone (DESYREL) 150 MG tablet Take 1 tablet (150 mg total) by mouth at bedtime. 90 tablet 3 01/18/2017 at Unknown time  . VENTOLIN HFA 108 (90 Base) MCG/ACT inhaler INHALE 2 PUFFS INTO THE LUNGS EVERY 6 HOURS AS NEEDED FOR WHEEZING. 18 g 11 Past Week at Unknown time  . zolpidem (AMBIEN) 10 MG tablet TAKE 1 TABLET BY MOUTH ONCE DAILY AT BEDTIME AS NEEDED (Patient taking differently: Take 10 mg by mouth at bedtime. ) 90 tablet 3 01/18/2017 at Unknown time  . EPINEPHRINE 0.3 mg/0.3 mL IJ SOAJ injection INJECT 0.3 MLS INTO THE MUSCLE ONCE. 2 Device PRN Unknown at Unknown  . HYDROcodone-acetaminophen (NORCO/VICODIN) 5-325 MG tablet Take 1 tablet by mouth every 8 (eight) hours as needed for moderate pain.   Unknown at Unknown  . ondansetron (ZOFRAN) 4 MG tablet Take 1 tablet (4 mg total) by mouth every 8 (eight) hours as needed for nausea or vomiting. 30 tablet 0 Unknown at Unknown   Assessment: 66 year old with moderately dilated RV and decreased RVEF not on anticoagulation prior to admission here to rule out pulmonary embolus. CBC stale in mid February, no bleeding reported. Pharmacy has been asked to start heparin.   Goal of Therapy:  Heparin level 0.3-0.7 units/ml Monitor platelets by anticoagulation protocol: Yes   Plan:  Give 5000 units bolus x 1 Start heparin infusion at 1600 units/hr Check anti-Xa level in 6 hours and daily while on heparin Continue to monitor H&H and platelets  Georga Bora, PharmD Clinical Pharmacist Pager: [phone] 01/19/2017 5:20 PM

## 2017-01-19 NOTE — Progress Notes (Signed)
Please refer to admission note.

## 2017-01-19 NOTE — H&P (Addendum)
[name] is a 66 y.o. female  Admit Date: 01/19/2017 Referring Physician: Emeline General, MD Primary Cardiologist: HWBSmith, III Chief complaint / reason for admission: Pulmonary embolism  HPI: [name] is a retired [employer] with a personal history of GE reflux, depression, morbid obesity, ulcerative colitis, hypokalemia, DMT2 (diet controlled), HTN, osteoarthritis, and asthma .  She was seen in the [facility] office on 12/29/2016 at the request of Dr. Renold Genta because of a several week history of exertional dyspnea. For several weeks she has been noting swelling in the right lower extremity. She had noticed decreased stamina. She admitted to inactivity due to severe osteoarthritis in her right knee. She thought swelling in the right knee was related to the osteoarthritis. She denies chest discomfort. She has had occasional palpitation. She denies orthopnea, PND, prolonged palpitations, and syncope. No prior history of pulmonary embolism. Because of the history she was referred for cardiac testing which included an echocardiogram, and a pharmacologic Myoview study. It has taken 3 weeks to get these tests performed. The echocardiogram performed today demonstrated right heart strain which led to scheduling of a chest CT with contrast today. This study demonstrated pulmonary embolism with evidence of acute right heart strain.  She has not acutely short of breath. She denies orthopnea. There is no leg pain.  PMH:    Past Medical History:  Diagnosis Date  . Anemia    none recently  . Anxiety   . Arthritis   . Asthma   . Complete tear of right rotator cuff 12/05/2014  . Depression   . Diabetes mellitus    not on meds.denies a1c 5.8  . Diverticulitis   . Diverticulosis   . Family history of adverse reaction to anesthesia    mom  nausea and vomiting  . GERD (gastroesophageal reflux disease)   . Hypertension   . Hypothyroidism   .  Migraine   . Personal history of kidney stones   . Pneumonia    hx  . Shortness of breath dyspnea    exersion  . Thyroid disease    hypo  . Wears glasses     PSH:    Past Surgical History:  Procedure Laterality Date  . ABDOMINAL HYSTERECTOMY    . CATARACT EXTRACTION, BILATERAL    . CESAREAN SECTION    . COLONOSCOPY     multiple   . ESOPHAGOGASTRODUODENOSCOPY ENDOSCOPY     multiple  . KNEE CARTILAGE SURGERY     Left  . LAPAROSCOPIC PARTIAL COLECTOMY N/A 04/22/2013   Procedure: LAPAROSCOPIC PARTIAL COLECTOMY;  Surgeon: Odis Hollingshead, MD;  Location: [facility];  Service: General;  Laterality: N/A;  . right foot surgery  little toe and next toe   x 3  . SHOULDER ARTHROSCOPY WITH ROTATOR CUFF REPAIR AND SUBACROMIAL DECOMPRESSION Right 12/05/2014   Procedure: RIGHT SHOULDER ARTHROSCOPY,ACROMOPLASTY, ROTATOR CUFF REPAIR;  Surgeon: [name], MD;  Location: [place];  Service: Orthopedics;  Laterality: Right;   ALLERGIES:   Levaquin [levofloxacin]; Lisinopril; Bee venom; and Morphine and related Prior to Admit Meds:   Prescriptions Prior to Admission  Medication Sig Dispense Refill Last Dose  . ALPRAZolam (XANAX) 0.5 MG tablet Take 1 tablet (0.5 mg total) by mouth 2 (two) times daily as needed. (Patient taking differently: Take 0.5 mg by mouth 2 (two) times daily as needed for anxiety. ) 180 tablet 3 01/18/2017 at Unknown time  . aspirin 81 MG tablet Take  81 mg by mouth daily.     Past Week at Unknown time  . aspirin-acetaminophen-caffeine (EXCEDRIN MIGRAINE) 250-250-65 MG per tablet Take 1 tablet by mouth every 6 (six) hours as needed for headache (or pain).    Unknown at Unknown  . cetirizine (ZYRTEC) 10 MG tablet Take 10 mg by mouth at bedtime.    01/18/2017 at Unknown time  . Cholecalciferol (VITAMIN D3) 5000 units CAPS Take 5,000 Units by mouth daily.   Past Week at Unknown time  . clobetasol ointment (TEMOVATE) 0.86 % Apply 1 application topically 2 (two) times  daily. 30 g 0 Past Week at Unknown time  . escitalopram (LEXAPRO) 10 MG tablet Take 1 tablet (10 mg total) by mouth daily. 90 tablet 3 Past Week at Unknown time  . esomeprazole (NEXIUM) 40 MG capsule TAKE 1 CAPSULE BY MOUTH TWICE DAILY (Patient taking differently: TAKE 1 CAPSULE BY MOUTH DAILY) 180 capsule 3 01/18/2017 at Unknown time  . famotidine (PEPCID) 10 MG tablet Take 10 mg by mouth at bedtime.   01/18/2017 at Unknown time  . furosemide (LASIX) 20 MG tablet Take 1 tablet (20 mg total) by mouth daily. (Patient taking differently: Take 40 mg by mouth daily. ) 90 tablet 3 Past Week at Unknown time  . levothyroxine (SYNTHROID, LEVOTHROID) 200 MCG tablet TAKE 1 TABLET BY MOUTH DAILY BEFORE BREAKFAST. 90 tablet 3 Past Week at Unknown time  . losartan (COZAAR) 50 MG tablet TAKE 1 TABLET BY MOUTH ONCE DAILY FOR HYPERTENSION 90 tablet 3 Past Week at Unknown time  . metoprolol tartrate (LOPRESSOR) 25 MG tablet Take 1 tablet (25 mg total) by mouth every morning. 90 tablet 3 Past Week at Unknown time  . montelukast (SINGULAIR) 10 MG tablet Take 1 tablet (10 mg total) by mouth at bedtime. 90 tablet 1 01/18/2017 at Unknown time  . OSCIMIN SR 0.375 MG 12 hr tablet Take 0.357 mg by mouth daily.  3 Past Week at Unknown time  . PRESCRIPTION MEDICATION Take 600 mg by mouth at bedtime. Med Name: Gwendel Hanson   01/18/2017 at Unknown time  . traZODone (DESYREL) 150 MG tablet Take 1 tablet (150 mg total) by mouth at bedtime. 90 tablet 3 01/18/2017 at Unknown time  . VENTOLIN HFA 108 (90 Base) MCG/ACT inhaler INHALE 2 PUFFS INTO THE LUNGS EVERY 6 HOURS AS NEEDED FOR WHEEZING. 18 g 11 Past Week at Unknown time  . zolpidem (AMBIEN) 10 MG tablet TAKE 1 TABLET BY MOUTH ONCE DAILY AT BEDTIME AS NEEDED (Patient taking differently: Take 10 mg by mouth at bedtime. ) 90 tablet 3 01/18/2017 at Unknown time  . EPINEPHRINE 0.3 mg/0.3 mL IJ SOAJ injection INJECT 0.3 MLS INTO THE MUSCLE ONCE. 2 Device PRN Unknown at Unknown  .  HYDROcodone-acetaminophen (NORCO/VICODIN) 5-325 MG tablet Take 1 tablet by mouth every 8 (eight) hours as needed for moderate pain.   Unknown at Unknown  . ondansetron (ZOFRAN) 4 MG tablet Take 1 tablet (4 mg total) by mouth every 8 (eight) hours as needed for nausea or vomiting. 30 tablet 0 Unknown at Unknown   Family HX:    Family History  Problem Relation Age of Onset  . Heart disease Mother   . Kidney failure Father   . Diabetes Father   . Breast cancer Paternal Aunt   . Colon cancer Neg Hx    Social HX:    Social History   Social History  . Marital status: Single    Spouse name: N/A  .  Number of children: N/A  . Years of education: N/A   Occupational History  . Not on file.   Social History Main Topics  . Smoking status: Former Smoker    Packs/day: 0.50    Years: 6.00    Types: Cigarettes    Quit date: 11/14/1982  . Smokeless tobacco: Never Used     Comment: quit smoking 30 years ago  . Alcohol use Yes     Comment:  very rare   . Drug use: No  . Sexual activity: Not on file   Other Topics Concern  . Not on file   Social History Narrative  . No narrative on file     ROS Severe osteoarthritis right knee greater than left. Right knee is been swelling intermittently over the past month. She denies hemoptysis. Appetite stable. No chest pain. No chills or fever.   All other systems are negative.  Physical Exam: Blood pressure 138/64, pulse 81, temperature 97.7 F (36.5 C), temperature source Oral, resp. rate 18, height 5\' 5"  (1.651 m), weight (!) 300 lb 8 oz (136.3 kg), SpO2 96 %.   Morbidly obese. Somewhat pale appearing. No obvious acute distress. HEENT exam reveals pupils are equal and reactive. No jaundice is noted. There is no pallor. Neck exam reveals no JVD or carotid bruits. Exam for JVD is difficult due to obesity. Chest is clear bilaterally. No wheezing or rhonchi are heard. Cardiac exam reveals an S4 gallop. An apical/left parasternal systolic murmur is  audible likely related to tricuspid regurgitation. Abdomen is obese, nontender, and tympanitic. Bowel sounds are present. Extremities reveal palpable pulses in the radial and posterior tibial bilaterally. Trivial right lower extremity ankle edema is noted. Neurological exam reveals no focal deficits. She is alert and oriented 3 and able to carry on a perfectly coherent conversation. Psychological exam reveals an appropriate affect. Labs: Lab Results  Component Value Date   WBC 8.7 12/27/2016   HGB 12.6 12/27/2016   HCT 40.0 12/27/2016   MCV 84.4 12/27/2016   PLT 282 12/27/2016   No results for input(s): NA, K, CL, CO2, BUN, CREATININE, CALCIUM, PROT, BILITOT, ALKPHOS, ALT, AST, GLUCOSE in the last 168 hours.  Invalid input(s): LABALBU Lab Results  Component Value Date   CKTOTAL 69 02/16/2010   CKMB 1.9 02/16/2010   TROPONINI 0.01        NO INDICATION OF MYOCARDIAL INJURY. 02/16/2010      Radiology:  Ct Angio Chest Pe W Or Wo Contrast  Result Date: 01/19/2017 CLINICAL DATA:  Newly diagnosed pulmonary hypertension. Increased shortness of breath since December. EXAM: CT ANGIOGRAPHY CHEST WITH CONTRAST TECHNIQUE: Multidetector CT imaging of the chest was performed using the standard protocol during bolus administration of intravenous contrast. Multiplanar CT image reconstructions and MIPs were obtained to evaluate the vascular anatomy. CONTRAST:  80 mL Isovue 370 COMPARISON:  None. FINDINGS: Cardiovascular: Satisfactory opacification of the pulmonary arteries to the segmental level. Pulmonary embolus in the right middle lobe lobar branch pulmonary artery. Small pulmonary embolus in the segmental branch of the right upper lobe. Pulmonary embolus in the right lower lobe segmental branches. Enlargement of the main pulmonary artery measuring 4 cm in diameter. Enlargement of the right main pulmonary artery measuring 3.2 cm in diameter. Left main pulmonary artery measures 2.7 cm in diameter. Normal  heart size. No pericardial effusion. Mediastinum/Nodes: No enlarged mediastinal, hilar, or axillary lymph nodes. Thyroid gland, trachea, and esophagus demonstrate no significant findings. Lungs/Pleura: Lungs are clear. No pleural effusion or  pneumothorax. Upper Abdomen: No acute abnormality. Musculoskeletal: No chest wall abnormality. No acute or significant osseous findings. Review of the MIP images confirms the above findings. IMPRESSION: 1. Multiple right lung pulmonary emboli as described above. 2. Positive for acute PE with CT evidence of right heart strain (RV/LV Ratio = 1) consistent with at least submassive (intermediate risk) PE. The presence of right heart strain has been associated with an increased risk of morbidity and mortality. Please activate Code PE by paging [phone]. 3. Findings compatible with pulmonary arterial hypertension. Critical Value/emergent results were called by telephone at the time of interpretation on 01/19/2017 at 2:07 pm to Dr. Denton Meek, who verbally acknowledged these results. Electronically Signed   By: Kathreen Devoid   On: 01/19/2017 14:08   ECHOCARDIOGRAM 01/18/17: Study Conclusions  - Left ventricle: The cavity size was normal. There was mild   concentric hypertrophy. Systolic function was normal. The   estimated ejection fraction was in the range of 60% to 65%. Wall   motion was normal; there were no regional wall motion   abnormalities. Doppler parameters are consistent with abnormal   left ventricular relaxation (grade 1 diastolic dysfunction).   There was no evidence of elevated ventricular filling pressure by   Doppler parameters. - Aortic valve: Trileaflet; normal thickness leaflets. There was no   regurgitation. - Aortic root: The aortic root was normal in size. - Mitral valve: There was trivial regurgitation. - Left atrium: The atrium was normal in size. - Right ventricle: The cavity size was moderately dilated. Wall   thickness was normal. Systolic  function was mildly reduced. - Tricuspid valve: There was mild regurgitation. - Pulmonary arteries: Systolic pressure was severely increased. PA   peak pressure: 69 mm Hg (S). - Inferior vena cava: The vessel was normal in size. - Pericardium, extracardiac: There was no pericardial effusion.  Impressions:  - Normal LVEF 55-60%.   Moderately dilated RV with mildly decreased RVEF.   Severe pulmonary hypertension suspicious of underlying pulmonary   etiology.  NUCLEAR Perfusion Myocardial Imaging 01/19/17: Study Highlights     Nuclear stress EF: 69%.  There was no ST segment deviation noted during stress.  Defect 1: There is a medium defect of moderate severity.  This is a low risk study.  The left ventricular ejection fraction is hyperdynamic (>65%).   Normal stress nuclear study with breast attenuation but no ischemia; EF 69 with normal wall motion.      EKG:  EKG performed as part of the myocardial perfusion study noted above demonstrated first degree AV block and nonspecific ST-T wave change.  ASSESSMENT:  1. Several week history of exertional dyspnea. Echocardiogram performed today demonstrated right heart enlargement with severe pulmonary hypertension. This led to pulmonary CT angiogram which demonstrated right lung multiple pulmonary emboli with acute right heart strain. NOW being admitted to the hospital for IV anticoagulation and further management. 2. Morbid obesity 3. Probable right lower extremity DVT. Need to exclude mobile thrombi in her legs before beginning ambulation. 4. Severe osteoarthritis right knee greater than left. 5. Essential hypertension 6. Diabetes mellitus, type II, uncertain control 7. History of ulcerative colitis  Plan:  1. IV heparin 2. Bilateral lower extremity venous Doppler studies to rule out DVT. Until that time, bed rest will be imposed. 3. Continue her usual outpatient medication regimen 4. Stool samples to exclude  gastrointestinal bleeding given history of ulcerative colitis. 5. Clinical management as dictated otherwise by the growing database.   Overall she is critically ill  related to clot burden in the lungs. We'll get several days of IV heparin anticoagulation before starting oral therapy. We need to determine if there is lower extremity DVT with risk of further embolization that may need to be treated with vena caval umbrella/filter.  Critical care time: 45 minutes was spent face-to-face with the patient. Additional time spent in directing care, reviewing multiple data data points collected today. Echocardiogram, myocardial perfusion study, and CT angiogram were personally reviewed.   [name] 01/19/2017 4:46 PM

## 2017-01-19 NOTE — Patient Instructions (Signed)
You are being admitted to [facility].  Please report to admitting at the [facility], Entrance A.  You will be admitted to [address].

## 2017-01-19 NOTE — Progress Notes (Signed)
New pt admission from MD office. Pt brought to the floor in stable condition. Vitals taken. Initial Assessment done. All immediate pertinent needs to patient addressed. Patient Guide given to patient. Important safety instructions relating to hospitalization reviewed with patient. Patient verbalized understanding. Will continue to monitor pt.  Clarise Cruz RN

## 2017-01-19 NOTE — Telephone Encounter (Signed)
-----   Message from Eileen Stanford, PA-C sent at 01/18/2017  8:30 PM EST ----- Normal LVEF 55-60%.Moderately dilated RV with mildly decreased RVEF.Severe pulmonary hypertension suspicious of underlying pulmonaryetiology. With these findings on echo, I think we need to rule out pulmonary embolus. Lets get a CT angio

## 2017-01-20 ENCOUNTER — Inpatient Hospital Stay (HOSPITAL_COMMUNITY): Payer: BLUE CROSS/BLUE SHIELD

## 2017-01-20 ENCOUNTER — Encounter (HOSPITAL_COMMUNITY): Payer: Self-pay | Admitting: Nurse Practitioner

## 2017-01-20 ENCOUNTER — Other Ambulatory Visit: Payer: Self-pay

## 2017-01-20 DIAGNOSIS — Z8719 Personal history of other diseases of the digestive system: Secondary | ICD-10-CM

## 2017-01-20 DIAGNOSIS — I10 Essential (primary) hypertension: Secondary | ICD-10-CM

## 2017-01-20 DIAGNOSIS — Z86711 Personal history of pulmonary embolism: Secondary | ICD-10-CM

## 2017-01-20 DIAGNOSIS — I2609 Other pulmonary embolism with acute cor pulmonale: Secondary | ICD-10-CM

## 2017-01-20 LAB — BASIC METABOLIC PANEL
ANION GAP: 13 (ref 5–15)
Anion gap: 9 (ref 5–15)
BUN: 12 mg/dL (ref 6–20)
BUN: 10 mg/dL (ref 6–20)
CHLORIDE: 101 mmol/L (ref 101–111)
CO2: 23 mmol/L (ref 22–32)
CO2: 26 mmol/L (ref 22–32)
CREATININE: 1.05 mg/dL — AB (ref 0.44–1.00)
Calcium: 9.1 mg/dL (ref 8.9–10.3)
Calcium: 9.2 mg/dL (ref 8.9–10.3)
Chloride: 105 mmol/L (ref 101–111)
Creatinine, Ser: 0.87 mg/dL (ref 0.44–1.00)
GFR calc Af Amer: >60 mL/min (ref >60)
GFR calc non Af Amer: >60 mL/min (ref >60)
GFR, EST NON AFRICAN AMERICAN: 55 mL/min — AB (ref >60)
GLUCOSE: 114 mg/dL — AB (ref 65–99)
Glucose, Bld: 113 mg/dL — ABNORMAL HIGH (ref 65–99)
POTASSIUM: 3.2 mmol/L — AB (ref 3.5–5.1)
POTASSIUM: 4.1 mmol/L (ref 3.5–5.1)
Sodium: 137 mmol/L (ref 135–145)
Sodium: 140 mmol/L (ref 135–145)

## 2017-01-20 LAB — HEPARIN LEVEL (UNFRACTIONATED)
HEPARIN UNFRACTIONATED: 0.69 [IU]/mL (ref 0.30–0.70)
Heparin Unfractionated: 0.62 IU/mL (ref 0.30–0.70)

## 2017-01-20 LAB — GLUCOSE, CAPILLARY
GLUCOSE-CAPILLARY: 106 mg/dL — AB (ref 65–99)
Glucose-Capillary: 114 mg/dL — ABNORMAL HIGH (ref 65–99)
Glucose-Capillary: 127 mg/dL — ABNORMAL HIGH (ref 65–99)
Glucose-Capillary: 115 mg/dL — ABNORMAL HIGH (ref 65–99)

## 2017-01-20 LAB — HEMOGLOBIN A1C
Hgb A1c MFr Bld: 6 % — ABNORMAL HIGH (ref 4.8–5.6)
Mean Plasma Glucose: 126 mg/dL

## 2017-01-20 MED ORDER — POTASSIUM CHLORIDE CRYS ER 20 MEQ PO TBCR
40.0000 meq | EXTENDED_RELEASE_TABLET | Freq: Once | ORAL | Status: AC
  Administered 2017-01-20: 40 meq via ORAL
  Filled 2017-01-20: qty 2

## 2017-01-20 MED ORDER — FUROSEMIDE 40 MG PO TABS
40.0000 mg | ORAL_TABLET | Freq: Every day | ORAL | Status: DC
  Administered 2017-01-20 – 2017-01-22 (×3): 40 mg via ORAL
  Filled 2017-01-20 (×3): qty 1

## 2017-01-20 NOTE — Progress Notes (Signed)
ANTICOAGULATION CONSULT NOTE - Follow Up Consult  Pharmacy Consult for heparin Indication: pulmonary embolus  Labs:  Recent Labs  01/19/17 1948 01/20/17 0016  HGB 13.3  --   HCT 41.9  --   PLT 280  --   APTT 91*  --   LABPROT 14.3  --   INR 1.11  --   HEPARINUNFRC  --  0.62  CREATININE 0.91  --     Assessment/Plan:  66yo female therapeutic on heparin with initial dosing for PE. Will continue gtt at current rate and confirm stable with additional level.   Wynona Neat, PharmD, BCPS  01/20/2017,1:20 AM

## 2017-01-20 NOTE — Progress Notes (Signed)
ANTICOAGULATION CONSULT NOTE - Initial Consult  Pharmacy Consult for heparin Indication: pulmonary embolus  Allergies  Allergen Reactions  . Levaquin [Levofloxacin] Anaphylaxis and Other (See Comments)    Possibly Levaquin or Lisinopril, pt was taking both at the time of reaction  . Lisinopril Anaphylaxis and Other (See Comments)    Possibly Lisinopril or Levaquin, pt was taking both at the time of reaction  . Bee Venom Swelling  . Morphine And Related Itching and Other (See Comments)    Needs benadryl prior to     Patient Measurements: Height: 5\' 5"  (165.1 cm) Weight: 299 lb 8 oz (135.9 kg) (Scale A) IBW/kg (Calculated) : 57 Heparin Dosing Weight: 90.8 Kg  Vital Signs: Temp: 97.7 F (36.5 C) (03/09 0714) Temp Source: Oral (03/09 0714) BP: 129/65 (03/09 0714) Pulse Rate: 75 (03/09 0714)  Labs:  Recent Labs  01/19/17 1948 01/20/17 0016 01/20/17 0100 01/20/17 0746  HGB 13.3  --   --   --   HCT 41.9  --   --   --   PLT 280  --   --   --   APTT 91*  --   --   --   LABPROT 14.3  --   --   --   INR 1.11  --   --   --   HEPARINUNFRC  --  0.62  --  0.69  CREATININE 0.91  --  0.87  --     Estimated Creatinine Clearance: 91.4 mL/min (by C-G formula based on SCr of 0.87 mg/dL).   Assessment: CC/HPI: 66 yo f presenting with acute PE - outpt ECHO showed RHS and CTA showed PE  PMH: GERD, depression, DM2, HTN  Anticoag: none pta - hep gtt for acute PE with RHS. Hep lvl within goal x 2 on 1600 units/hr  Renal: SCr 0.87  Heme/Onc: H&H 13.3/41.9, Plt 280  Goal of Therapy:  Heparin level 0.3-0.7 units/ml Monitor platelets by anticoagulation protocol: Yes   Plan:  Continue heparin 1600 units/hr Daily HL, CBC F/U plans for [facility] F/U Dopplers d/t likely mobile clot  Levester Fresh, PharmD, BCPS, BCCCP Clinical Pharmacist Clinical phone for 01/20/2017 from 7a-3:30p: [phone] If after 3:30p, please call main pharmacy at: [phone] 01/20/2017 9:42 AM

## 2017-01-20 NOTE — Progress Notes (Signed)
*  PRELIMINARY RESULTS* Vascular Ultrasound Lower extremity venous duplex has been completed.  Preliminary findings: No evidence of DVT or baker's cyst.  Landry Mellow, RDMS, RVT  01/20/2017, 12:38 PM

## 2017-01-20 NOTE — Progress Notes (Signed)
Progress Note  Patient Name: [name] Date of Encounter: 01/20/2017  Primary Cardiologist: Dr. [name]   Subjective   Stable. She denies dyspnea. No chest pain.   Inpatient Medications    Scheduled Meds: . aspirin EC  81 mg Oral Daily  . cholecalciferol  5,000 Units Oral Daily  . escitalopram  10 mg Oral Daily  . famotidine  10 mg Oral QHS  . insulin aspart  0-9 Units Subcutaneous TID WC  . levothyroxine  200 mcg Oral QAC breakfast  . loratadine  10 mg Oral Daily  . losartan  50 mg Oral Daily  . metoprolol tartrate  25 mg Oral q morning - 10a  . montelukast  10 mg Oral QHS  . pantoprazole  40 mg Oral Daily  . pneumococcal 23 valent vaccine  0.5 mL Intramuscular Tomorrow-1000  . sodium chloride flush  3 mL Intravenous Q12H  . traZODone  150 mg Oral QHS  . zolpidem  5 mg Oral QHS   Continuous Infusions: . heparin 1,600 Units/hr (01/20/17 0609)   PRN Meds: sodium chloride, acetaminophen, albuterol, ALPRAZolam, HYDROcodone-acetaminophen, nitroGLYCERIN, ondansetron (ZOFRAN) IV, sodium chloride flush   Vital Signs    Vitals:   01/19/17 1608 01/19/17 2020 01/20/17 0714  BP: 138/64 (!) 148/66 129/65  Pulse: 81 81 75  Resp: 18 17 18   Temp: 97.7 F (36.5 C) 98 F (36.7 C) 97.7 F (36.5 C)  TempSrc: Oral Oral Oral  SpO2: 96% 95% 94%  Weight: (!) 300 lb 8 oz (136.3 kg)  299 lb 8 oz (135.9 kg)  Height: 5\' 5"  (1.651 m)      Intake/Output Summary (Last 24 hours) at 01/20/17 0852 Last data filed at 01/20/17 0847  Gross per 24 hour  Intake            915.2 ml  Output              300 ml  Net            615.2 ml   Filed Weights   01/19/17 1608 01/20/17 0714  Weight: (!) 300 lb 8 oz (136.3 kg) 299 lb 8 oz (135.9 kg)    Telemetry    NSR - Personally Reviewed   Physical Exam   GEN: No acute distress. Morbidly obese   Neck: No JVD Cardiac: RRR, no murmurs, rubs, or gallops.  Respiratory: Clear to auscultation bilaterally. GI: Soft, nontender,  non-distended  MS: No edema; No deformity. Neuro:  Nonfocal  Psych: Normal affect   Labs    Chemistry Recent Labs Lab 01/19/17 1948 01/20/17 0100  NA 138 137  K 3.6 3.2*  CL 100* 101  CO2 26 23  GLUCOSE 143* 113*  BUN 14 12  CREATININE 0.91 0.87  CALCIUM 9.1 9.1  PROT 6.4*  --   ALBUMIN 3.4*  --   AST 26  --   ALT 18  --   ALKPHOS 68  --   BILITOT 0.4  --   GFRNONAA >60 >60  GFRAA >60 >60  ANIONGAP 12 13     Hematology Recent Labs Lab 01/19/17 1948  WBC 12.3*  RBC 4.90  HGB 13.3  HCT 41.9  MCV 85.5  MCH 27.1  MCHC 31.7  RDW 14.2  PLT 280    Cardiac EnzymesNo results for input(s): TROPONINI in the last 168 hours. No results for input(s): TROPIPOC in the last 168 hours.   BNP Recent Labs Lab 01/19/17 1948  BNP 16.3  DDimer No results for input(s): DDIMER in the last 168 hours.   Radiology    Ct Angio Chest Pe W Or Wo Contrast  Result Date: 01/19/2017 CLINICAL DATA:  Newly diagnosed pulmonary hypertension. Increased shortness of breath since December. EXAM: CT ANGIOGRAPHY CHEST WITH CONTRAST TECHNIQUE: Multidetector CT imaging of the chest was performed using the standard protocol during bolus administration of intravenous contrast. Multiplanar CT image reconstructions and MIPs were obtained to evaluate the vascular anatomy. CONTRAST:  80 mL Isovue 370 COMPARISON:  None. FINDINGS: Cardiovascular: Satisfactory opacification of the pulmonary arteries to the segmental level. Pulmonary embolus in the right middle lobe lobar branch pulmonary artery. Small pulmonary embolus in the segmental branch of the right upper lobe. Pulmonary embolus in the right lower lobe segmental branches. Enlargement of the main pulmonary artery measuring 4 cm in diameter. Enlargement of the right main pulmonary artery measuring 3.2 cm in diameter. Left main pulmonary artery measures 2.7 cm in diameter. Normal heart size. No pericardial effusion. Mediastinum/Nodes: No enlarged  mediastinal, hilar, or axillary lymph nodes. Thyroid gland, trachea, and esophagus demonstrate no significant findings. Lungs/Pleura: Lungs are clear. No pleural effusion or pneumothorax. Upper Abdomen: No acute abnormality. Musculoskeletal: No chest wall abnormality. No acute or significant osseous findings. Review of the MIP images confirms the above findings. IMPRESSION: 1. Multiple right lung pulmonary emboli as described above. 2. Positive for acute PE with CT evidence of right heart strain (RV/LV Ratio = 1) consistent with at least submassive (intermediate risk) PE. The presence of right heart strain has been associated with an increased risk of morbidity and mortality. Please activate Code PE by paging [phone]. 3. Findings compatible with pulmonary arterial hypertension. Critical Value/emergent results were called by telephone at the time of interpretation on 01/19/2017 at 2:07 pm to Dr. Denton Meek, who verbally acknowledged these results. Electronically Signed   By: Kathreen Devoid   On: 01/19/2017 14:08    Cardiac Studies   2D Echo 01/18/17 Study Conclusions  - Left ventricle: The cavity size was normal. There was mild   concentric hypertrophy. Systolic function was normal. The   estimated ejection fraction was in the range of 60% to 65%. Wall   motion was normal; there were no regional wall motion   abnormalities. Doppler parameters are consistent with abnormal   left ventricular relaxation (grade 1 diastolic dysfunction).   There was no evidence of elevated ventricular filling pressure by   Doppler parameters. - Aortic valve: Trileaflet; normal thickness leaflets. There was no   regurgitation. - Aortic root: The aortic root was normal in size. - Mitral valve: There was trivial regurgitation. - Left atrium: The atrium was normal in size. - Right ventricle: The cavity size was moderately dilated. Wall   thickness was normal. Systolic function was mildly reduced. - Tricuspid valve: There  was mild regurgitation. - Pulmonary arteries: Systolic pressure was severely increased. PA   peak pressure: 69 mm Hg (S). - Inferior vena cava: The vessel was normal in size. - Pericardium, extracardiac: There was no pericardial effusion.  Impressions:  - Normal LVEF 55-60%.   Moderately dilated RV with mildly decreased RVEF.   Severe pulmonary hypertension suspicious of underlying pulmonary   etiology.  Patient Profile     Ms. Hosein is a retired [employer] with a personal history of GE reflux, depression, morbid obesity, ulcerative colitis, hypokalemia, DMT2 (diet controlled), HTN, osteoarthritis, and asthma . She was seen in the [facility] office at the request  of Dr. Renold Genta because of a several week history of exertional dyspnea. For several weeks she has been noting swelling in the right lower extremity. She admitted to inactivity due to severe osteoarthritis in her right knee. She thought swelling in the right knee was related to the osteoarthritis. Because of the history she was referred for cardiac testing which included an echocardiogram, and a pharmacologic Myoview study. The echocardiogram performed 01/18/17 demonstrated right heart strain which led to scheduling of a chest CT with contrast. This study demonstrated pulmonary embolism with evidence of acute right heart strain. She was directed admitted for management for acute PE.    Assessment & Plan    1. Acute PE: confirmed by chest CT with evidence of right heart strain. She is hemodynamically stable. Oxygenating well on RA. No dyspnea or CP. Per Dr. [name], the plan is to keep her on IV heparin for several days before starting oral anticoagulation. Based on reported h/o, suspect probable right lower extremity DVT as primary source. We need to determine if there is lower extremity DVT with risk of further embolization that may need to be treated with vena caval umbrella/filter. Order  has been placed for bilateral LE venous dopplers. Continue bedrest until DVT is ruled out.    Signed, Lyda Jester, PA-C  01/20/2017, 8:52 AM

## 2017-01-21 DIAGNOSIS — Z5181 Encounter for therapeutic drug level monitoring: Secondary | ICD-10-CM

## 2017-01-21 DIAGNOSIS — Z7901 Long term (current) use of anticoagulants: Secondary | ICD-10-CM

## 2017-01-21 DIAGNOSIS — I272 Pulmonary hypertension, unspecified: Secondary | ICD-10-CM

## 2017-01-21 DIAGNOSIS — I517 Cardiomegaly: Secondary | ICD-10-CM

## 2017-01-21 LAB — GLUCOSE, CAPILLARY
GLUCOSE-CAPILLARY: 107 mg/dL — AB (ref 65–99)
GLUCOSE-CAPILLARY: 121 mg/dL — AB (ref 65–99)
Glucose-Capillary: 135 mg/dL — ABNORMAL HIGH (ref 65–99)
Glucose-Capillary: 122 mg/dL — ABNORMAL HIGH (ref 65–99)

## 2017-01-21 LAB — CBC
HCT: 40.7 % (ref 36.0–46.0)
HEMOGLOBIN: 12.9 g/dL (ref 12.0–15.0)
MCH: 27 pg (ref 26.0–34.0)
MCHC: 31.7 g/dL (ref 30.0–36.0)
MCV: 85.3 fL (ref 78.0–100.0)
PLATELETS: 284 10*3/uL (ref 150–400)
RBC: 4.77 MIL/uL (ref 3.87–5.11)
RDW: 14.5 % (ref 11.5–15.5)
WBC: 12.1 10*3/uL — ABNORMAL HIGH (ref 4.0–10.5)

## 2017-01-21 LAB — HEPARIN LEVEL (UNFRACTIONATED): HEPARIN UNFRACTIONATED: 0.6 [IU]/mL (ref 0.30–0.70)

## 2017-01-21 MED ORDER — RIVAROXABAN 15 MG PO TABS
15.0000 mg | ORAL_TABLET | Freq: Two times a day (BID) | ORAL | Status: DC
  Administered 2017-01-21 – 2017-01-22 (×3): 15 mg via ORAL
  Filled 2017-01-21 (×3): qty 1

## 2017-01-21 MED ORDER — HEPARIN (PORCINE) IN NACL 100-0.45 UNIT/ML-% IJ SOLN
1600.0000 [IU]/h | INTRAMUSCULAR | Status: AC
  Administered 2017-01-21 (×2): 1600 [IU]/h via INTRAVENOUS
  Filled 2017-01-21: qty 250

## 2017-01-21 MED ORDER — RIVAROXABAN 20 MG PO TABS: 20.0000 mg | ORAL_TABLET | Freq: Every day | ORAL | Status: DC

## 2017-01-21 NOTE — Progress Notes (Signed)
ANTICOAGULATION CONSULT NOTE - [place] for heparin Indication: pulmonary embolus  Allergies  Allergen Reactions  . Levaquin [Levofloxacin] Anaphylaxis and Other (See Comments)    Possibly Levaquin or Lisinopril, pt was taking both at the time of reaction  . Lisinopril Anaphylaxis and Other (See Comments)    Possibly Lisinopril or Levaquin, pt was taking both at the time of reaction  . Bee Venom Swelling  . Morphine And Related Itching and Other (See Comments)    Needs benadryl prior to     Patient Measurements: Height: 5\' 5"  (165.1 cm) Weight: 297 lb 12.8 oz (135.1 kg) (scale a) IBW/kg (Calculated) : 57 Heparin Dosing Weight: 90.8 Kg  Vital Signs: Temp: 98.3 F (36.8 C) (03/10 0442) Temp Source: Oral (03/10 0442) BP: 129/48 (03/10 0928) Pulse Rate: 81 (03/10 0442)  Labs:  Recent Labs  01/19/17 1948 01/20/17 0016 01/20/17 0100 01/20/17 0746 01/20/17 1603 01/21/17 0500 01/21/17 0815  HGB 13.3  --   --   --   --   --  12.9  HCT 41.9  --   --   --   --   --  40.7  PLT 280  --   --   --   --   --  284  APTT 91*  --   --   --   --   --   --   LABPROT 14.3  --   --   --   --   --   --   INR 1.11  --   --   --   --   --   --   HEPARINUNFRC  --  0.62  --  0.69  --  0.60  --   CREATININE 0.91  --  0.87  --  1.05*  --   --     Estimated Creatinine Clearance: 75.4 mL/min (by C-G formula based on SCr of 1.05 mg/dL (H)).   Assessment: CC/HPI: 66 yo f presenting with acute PE - outpt ECHO showed RHS and CTA showed PE  PMH: GERD, depression, DM2, HTN  Anticoag: none pta - hep gtt for acute PE with RHS. 3/9 Vascular ultrasound of lower extremity showed no signs of DVT Hep lvl within goal x 3 on 1600 units/hr  Renal: SCr 0.91  Heme/Onc: H&H 12.9/40.7, Plt 284  Goal of Therapy:  Heparin level 0.3-0.7 units/ml Monitor platelets by anticoagulation protocol: Yes   Plan:  Continue heparin 1600 units/hr Daily Heparin level, CBC F/U plans  for Oral anticoagulants   Uvaldo Bristle, PharmD PGY1 Pharmacy Resident Pager: [phone]  01/21/2017 10:28 AM

## 2017-01-21 NOTE — Progress Notes (Signed)
ANTICOAGULATION CONSULT NOTE - Initial Consult  Pharmacy Consult for Xarelto  Indication: pulmonary embolus  Allergies  Allergen Reactions  . Levaquin [Levofloxacin] Anaphylaxis and Other (See Comments)    Possibly Levaquin or Lisinopril, pt was taking both at the time of reaction  . Lisinopril Anaphylaxis and Other (See Comments)    Possibly Lisinopril or Levaquin, pt was taking both at the time of reaction  . Bee Venom Swelling  . Morphine And Related Itching and Other (See Comments)    Needs benadryl prior to     Patient Measurements: Height: 5\' 5"  (165.1 cm) Weight: 297 lb 12.8 oz (135.1 kg) (scale a) IBW/kg (Calculated) : 57   Vital Signs: Temp: 98.3 F (36.8 C) (03/10 0442) Temp Source: Oral (03/10 0442) BP: 129/48 (03/10 0928) Pulse Rate: 81 (03/10 0442)  Labs:  Recent Labs  01/19/17 1948 01/20/17 0016 01/20/17 0100 01/20/17 0746 01/20/17 1603 01/21/17 0500 01/21/17 0815  HGB 13.3  --   --   --   --   --  12.9  HCT 41.9  --   --   --   --   --  40.7  PLT 280  --   --   --   --   --  284  APTT 91*  --   --   --   --   --   --   LABPROT 14.3  --   --   --   --   --   --   INR 1.11  --   --   --   --   --   --   HEPARINUNFRC  --  0.62  --  0.69  --  0.60  --   CREATININE 0.91  --  0.87  --  1.05*  --   --     Estimated Creatinine Clearance: 75.4 mL/min (by C-G formula based on SCr of 1.05 mg/dL (H)).     Assessment: 66 yo female with acute PE. 3/9 Vascular ultrasound of lower extremity showed no evidence of DVT. Patient has been treated with heparin and will now be transitioned to Xarelto. Patient's CBC is stable and no signs of bleeding have been noted. Renal function is stable with last SCr- 0.91. CrCl~ 75 ml/min.    Plan:  Xarelto 15 mg twice a day for 21 days Then Xarelto 20 mg once a day with supper Monitor renal function Monitor for signs/symptoms of bleeding  Pharmacy will [place], PharmD PGY1 Pharmacy Resident Pager:  [phone]  01/21/2017,10:40 AM

## 2017-01-21 NOTE — Discharge Instructions (Signed)
Information on my medicine - XARELTO (rivaroxaban)  This medication education was reviewed with me or my healthcare representative as part of my discharge preparation.  WHY WAS XARELTO PRESCRIBED FOR YOU? Xarelto was prescribed to treat blood clots that may have been found in the veins of your legs (deep vein thrombosis) or in your lungs (pulmonary embolism) and to reduce the risk of them occurring again.  What do you need to know about Xarelto? The starting dose is one 15 mg tablet taken TWICE daily with food for the FIRST 21 DAYS then on 4/1  the dose is changed to one 20 mg tablet taken ONCE A DAY with your evening meal.  DO NOT stop taking Xarelto without talking to the health care provider who prescribed the medication.  Refill your prescription for 20 mg tablets before you run out.  After discharge, you should have regular check-up appointments with your healthcare provider that is prescribing your Xarelto.  In the future your dose may need to be changed if your kidney function changes by a significant amount.  What do you do if you miss a dose? If you are taking Xarelto TWICE DAILY and you miss a dose, take it as soon as you remember. You may take two 15 mg tablets (total 30 mg) at the same time then resume your regularly scheduled 15 mg twice daily the next day.  If you are taking Xarelto ONCE DAILY and you miss a dose, take it as soon as you remember on the same day then continue your regularly scheduled once daily regimen the next day. Do not take two doses of Xarelto at the same time.   Important Safety Information Xarelto is a blood thinner medicine that can cause bleeding. You should call your healthcare provider right away if you experience any of the following: ? Bleeding from an injury or your nose that does not stop. ? Unusual colored urine (red or dark brown) or unusual colored stools (red or black). ? Unusual bruising for unknown reasons. ? A serious fall or if you  hit your head (even if there is no bleeding).  Some medicines may interact with Xarelto and might increase your risk of bleeding while on Xarelto. To help avoid this, consult your healthcare provider or pharmacist prior to using any new prescription or non-prescription medications, including herbals, vitamins, non-steroidal anti-inflammatory drugs (NSAIDs) and supplements.  This website has more information on Xarelto: [email or web address]

## 2017-01-21 NOTE — Progress Notes (Signed)
Progress Note  Patient Name: [name] Date of Encounter: 01/21/2017  Primary Cardiologist: [name] MD  Subjective   Says shortness of breath has improved. No chest pain. Right leg pain/swelling has also improved.  Inpatient Medications    Scheduled Meds: . aspirin EC  81 mg Oral Daily  . cholecalciferol  5,000 Units Oral Daily  . escitalopram  10 mg Oral Daily  . famotidine  10 mg Oral QHS  . furosemide  40 mg Oral Daily  . insulin aspart  0-9 Units Subcutaneous TID WC  . levothyroxine  200 mcg Oral QAC breakfast  . loratadine  10 mg Oral Daily  . losartan  50 mg Oral Daily  . metoprolol tartrate  25 mg Oral q morning - 10a  . montelukast  10 mg Oral QHS  . pantoprazole  40 mg Oral Daily  . pneumococcal 23 valent vaccine  0.5 mL Intramuscular Tomorrow-1000  . sodium chloride flush  3 mL Intravenous Q12H  . traZODone  150 mg Oral QHS  . zolpidem  5 mg Oral QHS   Continuous Infusions: . heparin 1,600 Units/hr (01/20/17 1939)   PRN Meds: sodium chloride, acetaminophen, albuterol, ALPRAZolam, HYDROcodone-acetaminophen, nitroGLYCERIN, ondansetron (ZOFRAN) IV, sodium chloride flush   Vital Signs    Vitals:   01/20/17 1309 01/20/17 2209 01/21/17 0442 01/21/17 0928  BP: (!) 148/67 (!) 153/81 (!) 146/76 (!) 129/48  Pulse: 67 73 81   Resp: 18 18 18 18   Temp:  97.9 F (36.6 C) 98.3 F (36.8 C)   TempSrc:  Oral Oral   SpO2: 96% 96% 94% 96%  Weight:   297 lb 12.8 oz (135.1 kg)   Height:        Intake/Output Summary (Last 24 hours) at 01/21/17 1039 Last data filed at 01/21/17 1003  Gross per 24 hour  Intake          1535.73 ml  Output             2150 ml  Net          -614.27 ml   Filed Weights   01/19/17 1608 01/20/17 0714 01/21/17 0442  Weight: (!) 300 lb 8 oz (136.3 kg) 299 lb 8 oz (135.9 kg) 297 lb 12.8 oz (135.1 kg)    Telemetry    NSR - Personally Reviewed  ECG     - Personally Reviewed  Physical Exam   GEN: No acute distress.   Neck: No  JVD Cardiac: RRR, no murmurs, rubs, or gallops.  Respiratory: Clear to auscultation bilaterally. GI: Soft, nontender, obese  MS: No edema; No deformity. Neuro:  Nonfocal  Psych: Normal affect   Labs    Chemistry Recent Labs Lab 01/19/17 1948 01/20/17 0100 01/20/17 1603  NA 138 137 140  K 3.6 3.2* 4.1  CL 100* 101 105  CO2 26 23 26   GLUCOSE 143* 113* 114*  BUN 14 12 10   CREATININE 0.91 0.87 1.05*  CALCIUM 9.1 9.1 9.2  PROT 6.4*  --   --   ALBUMIN 3.4*  --   --   AST 26  --   --   ALT 18  --   --   ALKPHOS 68  --   --   BILITOT 0.4  --   --   GFRNONAA >60 >60 55*  GFRAA >60 >60 >60  ANIONGAP 12 13 9      Hematology Recent Labs Lab 01/19/17 1948 01/21/17 0815  WBC 12.3* 12.1*  RBC 4.90 4.77  HGB 13.3 12.9  HCT 41.9 40.7  MCV 85.5 85.3  MCH 27.1 27.0  MCHC 31.7 31.7  RDW 14.2 14.5  PLT 280 284    Cardiac EnzymesNo results for input(s): TROPONINI in the last 168 hours. No results for input(s): TROPIPOC in the last 168 hours.   BNP Recent Labs Lab 01/19/17 1948  BNP 16.3     DDimer No results for input(s): DDIMER in the last 168 hours.   Radiology    Ct Angio Chest Pe W Or Wo Contrast  Result Date: 01/19/2017 CLINICAL DATA:  Newly diagnosed pulmonary hypertension. Increased shortness of breath since December. EXAM: CT ANGIOGRAPHY CHEST WITH CONTRAST TECHNIQUE: Multidetector CT imaging of the chest was performed using the standard protocol during bolus administration of intravenous contrast. Multiplanar CT image reconstructions and MIPs were obtained to evaluate the vascular anatomy. CONTRAST:  80 mL Isovue 370 COMPARISON:  None. FINDINGS: Cardiovascular: Satisfactory opacification of the pulmonary arteries to the segmental level. Pulmonary embolus in the right middle lobe lobar branch pulmonary artery. Small pulmonary embolus in the segmental branch of the right upper lobe. Pulmonary embolus in the right lower lobe segmental branches. Enlargement of the main  pulmonary artery measuring 4 cm in diameter. Enlargement of the right main pulmonary artery measuring 3.2 cm in diameter. Left main pulmonary artery measures 2.7 cm in diameter. Normal heart size. No pericardial effusion. Mediastinum/Nodes: No enlarged mediastinal, hilar, or axillary lymph nodes. Thyroid gland, trachea, and esophagus demonstrate no significant findings. Lungs/Pleura: Lungs are clear. No pleural effusion or pneumothorax. Upper Abdomen: No acute abnormality. Musculoskeletal: No chest wall abnormality. No acute or significant osseous findings. Review of the MIP images confirms the above findings. IMPRESSION: 1. Multiple right lung pulmonary emboli as described above. 2. Positive for acute PE with CT evidence of right heart strain (RV/LV Ratio = 1) consistent with at least submassive (intermediate risk) PE. The presence of right heart strain has been associated with an increased risk of morbidity and mortality. Please activate Code PE by paging [phone]. 3. Findings compatible with pulmonary arterial hypertension. Critical Value/emergent results were called by telephone at the time of interpretation on 01/19/2017 at 2:07 pm to Dr. Denton Meek, who verbally acknowledged these results. Electronically Signed   By: Kathreen Devoid   On: 01/19/2017 14:08    Cardiac Studies   Echocardiogram (01/18/17):  - Left ventricle: The cavity size was normal. There was mild   concentric hypertrophy. Systolic function was normal. The   estimated ejection fraction was in the range of 60% to 65%. Wall   motion was normal; there were no regional wall motion   abnormalities. Doppler parameters are consistent with abnormal   left ventricular relaxation (grade 1 diastolic dysfunction).   There was no evidence of elevated ventricular filling pressure by   Doppler parameters. - Aortic valve: Trileaflet; normal thickness leaflets. There was no   regurgitation. - Aortic root: The aortic root was normal in size. -  Mitral valve: There was trivial regurgitation. - Left atrium: The atrium was normal in size. - Right ventricle: The cavity size was moderately dilated. Wall   thickness was normal. Systolic function was mildly reduced. - Tricuspid valve: There was mild regurgitation. - Pulmonary arteries: Systolic pressure was severely increased. PA   peak pressure: 69 mm Hg (S). - Inferior vena cava: The vessel was normal in size. - Pericardium, extracardiac: There was no pericardial effusion.  Impressions:  - Normal LVEF 55-60%.   Moderately dilated RV with mildly decreased RVEF.  Severe pulmonary hypertension suspicious of underlying pulmonary   etiology.  Patient Profile     66 y.o. female with hypertension and diabetes presenting with progressive exertional dyspnea and right leg pain/swelling found to have RV enlargement with severe pulmonary hypertension and ultimately found to have multiple right lung pulmonary emboli.   Assessment & Plan    1. Acute multiple right lung pulmonary emboli with RV strain: Currently doing much better from a symptomatic standpoint. No DVT. On IV heparin. I will consult pharmacy and switch to Xarelto. Ok to ambulate with assistance in hallways today.  2. Hypertension: Controlled. No changes.    Signed, Kate Sable, MD  01/21/2017, 10:39 AM

## 2017-01-22 ENCOUNTER — Encounter (HOSPITAL_COMMUNITY): Payer: Self-pay | Admitting: Student

## 2017-01-22 LAB — CBC
HCT: 40.2 % (ref 36.0–46.0)
Hemoglobin: 12.4 g/dL (ref 12.0–15.0)
MCH: 26.4 pg (ref 26.0–34.0)
MCHC: 30.8 g/dL (ref 30.0–36.0)
MCV: 85.5 fL (ref 78.0–100.0)
PLATELETS: 271 10*3/uL (ref 150–400)
RBC: 4.7 MIL/uL (ref 3.87–5.11)
RDW: 14.5 % (ref 11.5–15.5)
WBC: 9.8 10*3/uL (ref 4.0–10.5)

## 2017-01-22 LAB — GLUCOSE, CAPILLARY
GLUCOSE-CAPILLARY: 103 mg/dL — AB (ref 65–99)
Glucose-Capillary: 114 mg/dL — ABNORMAL HIGH (ref 65–99)

## 2017-01-22 MED ORDER — RIVAROXABAN (XARELTO) VTE STARTER PACK (15 & 20 MG): ORAL_TABLET | ORAL | 0 refills | Status: DC

## 2017-01-22 NOTE — Progress Notes (Signed)
Progress Note  Patient Name: ARLYSS WEATHERSBY Date of Encounter: 01/22/2017  Primary Cardiologist: Linard Millers MD  Subjective   Denies chest pain and shortness of breath. Several questions about her PE.  Inpatient Medications    Scheduled Meds: . aspirin EC  81 mg Oral Daily  . cholecalciferol  5,000 Units Oral Daily  . escitalopram  10 mg Oral Daily  . famotidine  10 mg Oral QHS  . furosemide  40 mg Oral Daily  . insulin aspart  0-9 Units Subcutaneous TID WC  . levothyroxine  200 mcg Oral QAC breakfast  . loratadine  10 mg Oral Daily  . losartan  50 mg Oral Daily  . metoprolol tartrate  25 mg Oral q morning - 10a  . montelukast  10 mg Oral QHS  . pantoprazole  40 mg Oral Daily  . pneumococcal 23 valent vaccine  0.5 mL Intramuscular Tomorrow-1000  . Rivaroxaban  15 mg Oral BID WC  . [START ON 02/12/2017] rivaroxaban  20 mg Oral Daily  . sodium chloride flush  3 mL Intravenous Q12H  . traZODone  150 mg Oral QHS  . zolpidem  5 mg Oral QHS   Continuous Infusions:  PRN Meds: sodium chloride, acetaminophen, albuterol, ALPRAZolam, HYDROcodone-acetaminophen, nitroGLYCERIN, ondansetron (ZOFRAN) IV, sodium chloride flush   Vital Signs    Vitals:   01/21/17 0928 01/21/17 1204 01/21/17 1930 01/22/17 0648  BP: (!) 129/48 139/83 (!) 152/77 (!) 134/55  Pulse:  66 69 69  Resp: 18 20 20 18   Temp:  97.9 F (36.6 C) 97.8 F (36.6 C) 98.2 F (36.8 C)  TempSrc:  Oral Oral Oral  SpO2: 96% 93% 96% 94%  Weight:    297 lb 1.6 oz (134.8 kg)  Height:        Intake/Output Summary (Last 24 hours) at 01/22/17 0920 Last data filed at 01/22/17 2778  Gross per 24 hour  Intake              960 ml  Output             1500 ml  Net             -540 ml   Filed Weights   01/20/17 0714 01/21/17 0442 01/22/17 0648  Weight: 299 lb 8 oz (135.9 kg) 297 lb 12.8 oz (135.1 kg) 297 lb 1.6 oz (134.8 kg)    Telemetry    NSR - Personally Reviewed  ECG     - Personally Reviewed  Physical Exam     GEN:No acute distress.   Neck:No JVD Cardiac:RRR, no murmurs, rubs, or gallops.  Respiratory:Clear to auscultation bilaterally. EU:MPNT, nontender, obese  MS:No edema; No deformity. Neuro:Nonfocal  Psych: Normal affect   Labs    Chemistry Recent Labs Lab 01/19/17 1948 01/20/17 0100 01/20/17 1603  NA 138 137 140  K 3.6 3.2* 4.1  CL 100* 101 105  CO2 26 23 26   GLUCOSE 143* 113* 114*  BUN 14 12 10   CREATININE 0.91 0.87 1.05*  CALCIUM 9.1 9.1 9.2  PROT 6.4*  --   --   ALBUMIN 3.4*  --   --   AST 26  --   --   ALT 18  --   --   ALKPHOS 68  --   --   BILITOT 0.4  --   --   GFRNONAA >60 >60 55*  GFRAA >60 >60 >60  ANIONGAP 12 13 9      Hematology Recent Labs Lab  01/19/17 1948 01/21/17 0815 01/22/17 0537  WBC 12.3* 12.1* 9.8  RBC 4.90 4.77 4.70  HGB 13.3 12.9 12.4  HCT 41.9 40.7 40.2  MCV 85.5 85.3 85.5  MCH 27.1 27.0 26.4  MCHC 31.7 31.7 30.8  RDW 14.2 14.5 14.5  PLT 280 284 271    Cardiac EnzymesNo results for input(s): TROPONINI in the last 168 hours. No results for input(s): TROPIPOC in the last 168 hours.   BNP Recent Labs Lab 01/19/17 1948  BNP 16.3     DDimer No results for input(s): DDIMER in the last 168 hours.   Radiology    No results found.  Cardiac Studies   Echocardiogram (01/18/17):  - Left ventricle: The cavity size was normal. There was mild concentric hypertrophy. Systolic function was normal. The estimated ejection fraction was in the range of 60% to 65%. Wall motion was normal; there were no regional wall motion abnormalities. Doppler parameters are consistent with abnormal left ventricular relaxation (grade 1 diastolic dysfunction). There was no evidence of elevated ventricular filling pressure by Doppler parameters. - Aortic valve: Trileaflet; normal thickness leaflets. There was no regurgitation. - Aortic root: The aortic root was normal in size. - Mitral valve: There was trivial  regurgitation. - Left atrium: The atrium was normal in size. - Right ventricle: The cavity size was moderately dilated. Wall thickness was normal. Systolic function was mildly reduced. - Tricuspid valve: There was mild regurgitation. - Pulmonary arteries: Systolic pressure was severely increased. PA peak pressure: 69 mm Hg (S). - Inferior vena cava: The vessel was normal in size. - Pericardium, extracardiac: There was no pericardial effusion.  Impressions:  - Normal LVEF 55-60%. Moderately dilated RV with mildly decreased RVEF. Severe pulmonary hypertension suspicious of underlying pulmonary etiology.  Patient Profile     66 y.o. female with hypertension and diabetes presenting with progressive exertional dyspnea and right leg pain/swelling found to have RV enlargement with severe pulmonary hypertension and ultimately found to have multiple right lung pulmonary emboli.   Assessment & Plan    1. Acute multiple right lung pulmonary emboli with RV strain: Continues to do well from a symptomatic standpoint. No DVT. I switched to Xarelto last night from IV heparin.  Consider outpatient clotting disorder workup (factor V Leiden, lupus anticoagulant, etc) with potential hematology referral. Will need a follow up echocardiogram in the next several months to assess right ventricle and pulmonary pressures.  2. Hypertension: Controlled. No changes.  Dispo: Will discharge to home later today. Sister can pick her up after she attends a funeral.  Signed, Kate Sable, MD  01/22/2017, 9:20 AM

## 2017-01-22 NOTE — Care Management Note (Signed)
Case Management Note  Patient Details  Name: [name] MRN: [id number] Date of Birth: 1951/11/02  Subjective/Objective:                    Pulmonary embolism with acute cor pulmonale [facility]) Action/Plan: Discharge planning Expected Discharge Date:  01/22/17               Expected Discharge Plan:  Home/Self Care  In-House Referral:     Discharge planning Services  CM Consult, Medication Assistance  Post Acute Care Choice:  NA Choice offered to:  NA  DME Arranged:  N/A DME Agency:  NA  HH Arranged:  NA HH Agency:  NA  Status of Service:  Completed, signed off  If discussed at [place] of Stay Meetings, dates discussed:    Additional Comments: CM met with pt in room and gave pt free 30 day trial card for Xarelto.  Pt verbalized understanding this card will pay for today's prescription and allow insurance time to authorize for refills.  No other CM needs were communicated. Dellie Catholic, RN 01/22/2017, 11:12 AM

## 2017-01-22 NOTE — Discharge Summary (Signed)
Discharge Summary    Patient ID: [name],  MRN: [id number], DOB/AGE: 65-Apr-1952 65 y.o.  Admit date: 01/19/2017 Discharge date: 01/22/2017  Primary Care Provider: Cresenciano Lick [place] Primary Cardiologist: Dr. [name]  Discharge Diagnoses    Principal Problem:   Pulmonary embolism with acute cor pulmonale ([place]) Active Problems:   Hyperlipidemia   Type 2 diabetes mellitus ([place])   Morbid obesity ([place])   History of ulcerative colitis   Acute saddle pulmonary embolism with acute cor pulmonale ([facility])   History of Present Illness     [name] is a 66 y.o. female with past medical history of HTN, Type 2 DM, UC, and GERD who was seen in the office by Dr. [name] on 01/20/2015 and directly admitted to [facility].  She had been followed for worsening exertional dyspnea and swelling along her right lower extremity. She was initially seen on 2/15 for her progressive symptoms with an echocardiogram and Myoview being ordered at that time. Myoview showed no evidence of ischemia and was low-risk. Echo was obtained on 3/7 and showed an EF of 60-65% with a moderately dilated RVEF and severe pulmonary HTN suspicious for underlying PE. Therefore a CTA was ordered and showed multiple right lung PE with evidence of right heart strain.   She was seen in the office by Dr. [name] and direct admission to the hospital was recommended due to her significant clot burden and to assess for DVT's. Was started on Heparin upon admission.   Hospital Course     Consultants: None   The following morning, she reported significant improvement in her dyspnea and denied any chest discomfort. Was continued on IV Heparin. Remained on strict bed rest until lower extremity dopplers could be obatained. Doppler studies performed on 3/9 showed no evidence of DVT or Baker's cyst.   She continued to remain hemodynamically stable. Was switched from Heparin to Xarelto on 01/21/2017. She will be on Xarelto 15mg  BID for 21 days  then 20mg  daily.   On 01/22/2017, she was examined by Dr. Bronson Ing and deemed stable for discharge. Outpatient clotting disorder workup is recommended along with a repeat echocardiogram in 3-6 months to reassess pressures. She was given a printed Rx to use with Xarelto along with a 30-day card from Case Management. A staff message has been sent to the office to arrange for Cardiology follow-up. She was discharged home in stable condition.   _____________  Discharge Vitals Blood pressure (!) 125/56, pulse 69, temperature 98.2 F (36.8 C), temperature source Oral, resp. rate 18, height 5\' 5"  (1.651 m), weight 297 lb 1.6 oz (134.8 kg), SpO2 97 %.  Filed Weights   01/20/17 0714 01/21/17 0442 01/22/17 0648  Weight: 299 lb 8 oz (135.9 kg) 297 lb 12.8 oz (135.1 kg) 297 lb 1.6 oz (134.8 kg)    Labs & Radiologic Studies     CBC  Recent Labs  01/19/17 1948 01/21/17 0815 01/22/17 0537  WBC 12.3* 12.1* 9.8  NEUTROABS 6.1  --   --   HGB 13.3 12.9 12.4  HCT 41.9 40.7 40.2  MCV 85.5 85.3 85.5  PLT 280 284 734   Basic Metabolic Panel  Recent Labs  01/19/17 1948 01/20/17 0100 01/20/17 1603  NA 138 137 140  K 3.6 3.2* 4.1  CL 100* 101 105  CO2 26 23 26   GLUCOSE 143* 113* 114*  BUN 14 12 10   CREATININE 0.91 0.87 1.05*  CALCIUM 9.1 9.1 9.2  MG 1.5*  --   --  Liver Function Tests  Recent Labs  01/19/17 1948  AST 26  ALT 18  ALKPHOS 68  BILITOT 0.4  PROT 6.4*  ALBUMIN 3.4*   No results for input(s): LIPASE, AMYLASE in the last 72 hours. Cardiac Enzymes No results for input(s): CKTOTAL, CKMB, CKMBINDEX, TROPONINI in the last 72 hours. BNP Invalid input(s): POCBNP D-Dimer No results for input(s): DDIMER in the last 72 hours. Hemoglobin A1C  Recent Labs  01/19/17 1948  HGBA1C 6.0*   Fasting Lipid Panel No results for input(s): CHOL, HDL, LDLCALC, TRIG, CHOLHDL, LDLDIRECT in the last 72 hours. Thyroid Function Tests  Recent Labs  01/19/17 1948  TSH 1.449     Dg Chest 2 View  Result Date: 12/27/2016 CLINICAL DATA:  Dyspnea on exertion EXAM: CHEST  2 VIEW COMPARISON:  04/15/2013 FINDINGS: The heart size and mediastinal contours are within normal limits. Both lungs are clear. The visualized skeletal structures are unremarkable. IMPRESSION: No active cardiopulmonary disease. Electronically Signed   By: Inez Catalina M.D.   On: 12/27/2016 16:09   Ct Angio Chest Pe W Or Wo Contrast  Result Date: 01/19/2017 CLINICAL DATA:  Newly diagnosed pulmonary hypertension. Increased shortness of breath since December. EXAM: CT ANGIOGRAPHY CHEST WITH CONTRAST TECHNIQUE: Multidetector CT imaging of the chest was performed using the standard protocol during bolus administration of intravenous contrast. Multiplanar CT image reconstructions and MIPs were obtained to evaluate the vascular anatomy. CONTRAST:  80 mL Isovue 370 COMPARISON:  None. FINDINGS: Cardiovascular: Satisfactory opacification of the pulmonary arteries to the segmental level. Pulmonary embolus in the right middle lobe lobar branch pulmonary artery. Small pulmonary embolus in the segmental branch of the right upper lobe. Pulmonary embolus in the right lower lobe segmental branches. Enlargement of the main pulmonary artery measuring 4 cm in diameter. Enlargement of the right main pulmonary artery measuring 3.2 cm in diameter. Left main pulmonary artery measures 2.7 cm in diameter. Normal heart size. No pericardial effusion. Mediastinum/Nodes: No enlarged mediastinal, hilar, or axillary lymph nodes. Thyroid gland, trachea, and esophagus demonstrate no significant findings. Lungs/Pleura: Lungs are clear. No pleural effusion or pneumothorax. Upper Abdomen: No acute abnormality. Musculoskeletal: No chest wall abnormality. No acute or significant osseous findings. Review of the MIP images confirms the above findings. IMPRESSION: 1. Multiple right lung pulmonary emboli as described above. 2. Positive for acute PE with CT  evidence of right heart strain (RV/LV Ratio = 1) consistent with at least submassive (intermediate risk) PE. The presence of right heart strain has been associated with an increased risk of morbidity and mortality. Please activate Code PE by paging [phone]. 3. Findings compatible with pulmonary arterial hypertension. Critical Value/emergent results were called by telephone at the time of interpretation on 01/19/2017 at 2:07 pm to Dr. Denton Meek, who verbally acknowledged these results. Electronically Signed   By: Kathreen Devoid   On: 01/19/2017 14:08     Diagnostic Studies/Procedures    Lower Extremity Dopplers: 01/20/2017 No evidence of deep vein thrombosis involving the right lower   extremity and left lower extremity. - No evidence of Baker&'s cyst on the right or left.  Disposition   Pt is being discharged home today in good condition.  Follow-up Plans & Appointments    Follow-up Needham III, MD Follow up.   Specialty:  Cardiology Why:  The office will contact you to arrange follow-up. If you do not hear from them in the next 3 business days, please call the number provided.  Contact  information: [address] [place] [address] [phone]        Elby Showers, MD Follow up.   Specialty:  Internal Medicine Why:  Please follow-up with your PCP in 1 week. Would recommend discussuion of a Hematology consult in the setting of recent Pulmonary Embolism. Contact information: [address] [id number] [phone]          Discharge Instructions    Increase activity slowly    Complete by:  As directed       Discharge Medications     Medication List    STOP taking these medications   aspirin 81 MG tablet     TAKE these medications   ALPRAZolam 0.5 MG tablet Commonly known as:  XANAX Take 1 tablet (0.5 mg total) by mouth 2 (two) times daily as needed. What changed:  reasons to take this     aspirin-acetaminophen-caffeine 250-250-65 MG tablet Commonly known as:  EXCEDRIN MIGRAINE Take 1 tablet by mouth every 6 (six) hours as needed for headache (or pain).   cetirizine 10 MG tablet Commonly known as:  ZYRTEC Take 10 mg by mouth at bedtime.   clobetasol ointment 0.05 % Commonly known as:  TEMOVATE Apply 1 application topically 2 (two) times daily.   EPINEPHrine 0.3 mg/0.3 mL Soaj injection Commonly known as:  EPI-PEN INJECT 0.3 MLS INTO THE MUSCLE ONCE.   escitalopram 10 MG tablet Commonly known as:  LEXAPRO Take 1 tablet (10 mg total) by mouth daily.   esomeprazole 40 MG capsule Commonly known as:  NEXIUM TAKE 1 CAPSULE BY MOUTH TWICE DAILY What changed:  See the new instructions.   famotidine 10 MG tablet Commonly known as:  PEPCID Take 10 mg by mouth at bedtime.   furosemide 20 MG tablet Commonly known as:  LASIX Take 1 tablet (20 mg total) by mouth daily. What changed:  how much to take   HYDROcodone-acetaminophen 5-325 MG tablet Commonly known as:  NORCO/VICODIN Take 1 tablet by mouth every 8 (eight) hours as needed for moderate pain.   levothyroxine 200 MCG tablet Commonly known as:  SYNTHROID, LEVOTHROID TAKE 1 TABLET BY MOUTH DAILY BEFORE BREAKFAST.   losartan 50 MG tablet Commonly known as:  COZAAR TAKE 1 TABLET BY MOUTH ONCE DAILY FOR HYPERTENSION   metoprolol tartrate 25 MG tablet Commonly known as:  LOPRESSOR Take 1 tablet (25 mg total) by mouth every morning.   montelukast 10 MG tablet Commonly known as:  SINGULAIR Take 1 tablet (10 mg total) by mouth at bedtime.   ondansetron 4 MG tablet Commonly known as:  ZOFRAN Take 1 tablet (4 mg total) by mouth every 8 (eight) hours as needed for nausea or vomiting.   OSCIMIN SR 0.375 MG 12 hr tablet Generic drug:  hyoscyamine Take 0.357 mg by mouth daily.   PRESCRIPTION MEDICATION Take 600 mg by mouth at bedtime. Med Name: ZYFEO   Rivaroxaban 15 & 20 MG Tbpk Take as directed on  package: Start with one 15mg  tablet by mouth twice a day with food. On Day 22, switch to one 20mg  tablet once a day with food.   traZODone 150 MG tablet Commonly known as:  DESYREL Take 1 tablet (150 mg total) by mouth at bedtime.   VENTOLIN HFA 108 (90 Base) MCG/ACT inhaler Generic drug:  albuterol INHALE 2 PUFFS INTO THE LUNGS EVERY 6 HOURS AS NEEDED FOR WHEEZING.   Vitamin D3 5000 units Caps Take 5,000 Units by mouth daily.  zolpidem 10 MG tablet Commonly known as:  AMBIEN TAKE 1 TABLET BY MOUTH ONCE DAILY AT BEDTIME AS NEEDED What changed:  how much to take  how to take this  when to take this  additional instructions        Allergies Allergies  Allergen Reactions  . Levaquin [Levofloxacin] Anaphylaxis and Other (See Comments)    Possibly Levaquin or Lisinopril, pt was taking both at the time of reaction  . Lisinopril Anaphylaxis and Other (See Comments)    Possibly Lisinopril or Levaquin, pt was taking both at the time of reaction  . Bee Venom Swelling  . Morphine And Related Itching and Other (See Comments)    Needs benadryl prior to      Outstanding Labs/Studies   Echo in 3-6 months.   Duration of Discharge Encounter   Greater than 30 minutes including physician time.  Signed, Erma Heritage, PA-C 01/22/2017, 10:58 AM

## 2017-01-22 NOTE — Progress Notes (Signed)
Pt discharged home. IV and tele removed. Discharged instructions given questions answers. Taken out via wheelchair.

## 2017-01-23 ENCOUNTER — Telehealth: Payer: Self-pay | Admitting: *Deleted

## 2017-01-23 NOTE — Telephone Encounter (Signed)
Patient contacted regarding discharge from [facility] on 01/22/17.  Patient understands to follow up with provider Nell Range  on 02/01/17 at [facility]. Patient understands discharge instructions? yes Patient understands medications and regiment? yes Patient understands to bring all medications to this visit? yes

## 2017-01-23 NOTE — Telephone Encounter (Signed)
-----   Message from Eileen Stanford, PA-C sent at 01/22/2017  3:22 PM EDT ----- [name] wanted post hosp follow up. I made her an appointmnet with me 3/21. Can someone call her and tell her about the appointment and also do a TOC call? Thanks!

## 2017-01-25 ENCOUNTER — Encounter: Payer: Self-pay | Admitting: Physician Assistant

## 2017-01-27 ENCOUNTER — Encounter: Payer: Self-pay | Admitting: Internal Medicine

## 2017-01-27 ENCOUNTER — Ambulatory Visit (INDEPENDENT_AMBULATORY_CARE_PROVIDER_SITE_OTHER): Payer: BLUE CROSS/BLUE SHIELD | Admitting: Internal Medicine

## 2017-01-27 VITALS — BP 130/82 | HR 66 | Temp 98.1°F | Resp 20 | Wt 289.0 lb

## 2017-01-27 DIAGNOSIS — E784 Other hyperlipidemia: Secondary | ICD-10-CM | POA: Diagnosis not present

## 2017-01-27 DIAGNOSIS — Z9049 Acquired absence of other specified parts of digestive tract: Secondary | ICD-10-CM | POA: Diagnosis not present

## 2017-01-27 DIAGNOSIS — I10 Essential (primary) hypertension: Secondary | ICD-10-CM

## 2017-01-27 DIAGNOSIS — E039 Hypothyroidism, unspecified: Secondary | ICD-10-CM

## 2017-01-27 DIAGNOSIS — E7849 Other hyperlipidemia: Secondary | ICD-10-CM

## 2017-01-27 DIAGNOSIS — L439 Lichen planus, unspecified: Secondary | ICD-10-CM

## 2017-01-27 DIAGNOSIS — R7302 Impaired glucose tolerance (oral): Secondary | ICD-10-CM | POA: Diagnosis not present

## 2017-01-27 DIAGNOSIS — R0602 Shortness of breath: Secondary | ICD-10-CM | POA: Diagnosis not present

## 2017-01-27 DIAGNOSIS — I2699 Other pulmonary embolism without acute cor pulmonale: Secondary | ICD-10-CM | POA: Diagnosis not present

## 2017-01-30 ENCOUNTER — Institutional Professional Consult (permissible substitution): Payer: BLUE CROSS/BLUE SHIELD | Admitting: Internal Medicine

## 2017-01-31 NOTE — Progress Notes (Signed)
Cardiology Office Note    Date:  02/01/2017   ID:  [name], DOB 08-07-1951, MRN [id number]  PCP:  Elby Showers, MD  Cardiologist:  Dr. [name]   CC: post hospital follow up- PE  History of Present Illness:  [name] is a 66 y.o. female with a history of GE reflux, depression, morbid obesity, ulcerative colitis, hypokalemia, DMT2 (diet controlled), HTN, asthma and recently diagnosed PE who presents to clinic for post hospital follow up.  She had been followed for worsening exertional dyspnea and swelling along her right lower extremity. She was initially seen on 2/15 for her progressive symptoms with an echocardiogram and Myoview being ordered at that time. Myoview showed no evidence of ischemia and was low-risk. Echo was obtained on 3/7 and showed an EF of 60-65% with a moderately dilated RVEF and severe pulmonary HTN suspicious for underlying PE. Therefore a CTA was ordered and showed multiple right lung PE with evidence of right heart strain.   She was seen in the office by Dr. [name] and direct admission to the hospital was recommended due to her significant clot burden and to assess for DVT's. She was started on Heparin and then transitioned to Xarelto on 3/10. Doppler studies performed on 3/9 showed no evidence of DVT or Baker's cyst. Outpatient clotting disorder workup is recommended along with a repeat echocardiogram in 3-6 months to reassess pressures.  Today she presents to clinic for follow up. She has been feeling pretty good. The shortness of breath is getting progressively better. She doesn't have the stamina she used to have and feels tired after a load of laundry. No chest pain. Does have some lower extremity edema in right leg still but no pain. No palpitations. No blood in stool or urine. No dizziness or syncope.     Past Medical History:  Diagnosis Date  . Anemia    none recently  . Anxiety   . Arthritis   . Asthma   . Complete tear of right rotator  cuff 12/05/2014  . Depression   . Diabetes mellitus    not on meds.denies a1c 5.8  . Diverticulitis   . Diverticulosis   . Family history of adverse reaction to anesthesia    mom  nausea and vomiting  . GERD (gastroesophageal reflux disease)   . Hypertension   . Hypothyroidism   . Migraine   . Personal history of kidney stones   . Pneumonia    hx  . Pulmonary embolism ([place])    a. diagnosed in 01/2017 w/ imaging showing multiple right lung PE with right heart strain. Started on Xarelto.   . Shortness of breath dyspnea    exersion  . Thyroid disease    hypo  . Wears glasses     Past Surgical History:  Procedure Laterality Date  . ABDOMINAL HYSTERECTOMY    . CATARACT EXTRACTION, BILATERAL    . CESAREAN SECTION    . COLONOSCOPY     multiple   . ESOPHAGOGASTRODUODENOSCOPY ENDOSCOPY     multiple  . KNEE CARTILAGE SURGERY     Left  . LAPAROSCOPIC PARTIAL COLECTOMY N/A 04/22/2013   Procedure: LAPAROSCOPIC PARTIAL COLECTOMY;  Surgeon: Odis Hollingshead, MD;  Location: [facility];  Service: General;  Laterality: N/A;  . right foot surgery  little toe and next toe   x 3  . SHOULDER ARTHROSCOPY WITH ROTATOR CUFF REPAIR AND SUBACROMIAL DECOMPRESSION Right 12/05/2014   Procedure: RIGHT SHOULDER ARTHROSCOPY,ACROMOPLASTY, ROTATOR CUFF REPAIR;  Surgeon: [name], MD;  Location: [facility];  Service: Orthopedics;  Laterality: Right;    Current Medications: Outpatient Medications Prior to Visit  Medication Sig Dispense Refill  . ALPRAZolam (XANAX) 0.5 MG tablet Take 1 tablet (0.5 mg total) by mouth 2 (two) times daily as needed. 180 tablet 3  . cetirizine (ZYRTEC) 10 MG tablet Take 10 mg by mouth at bedtime.     . Cholecalciferol (VITAMIN D3) 5000 units CAPS Take 5,000 Units by mouth daily.    Marland Kitchen EPINEPHRINE 0.3 mg/0.3 mL IJ SOAJ injection INJECT 0.3 MLS INTO THE MUSCLE ONCE. 2 Device PRN  . escitalopram (LEXAPRO) 10 MG tablet Take 1 tablet (10 mg total) by mouth daily. 90  tablet 3  . esomeprazole (NEXIUM) 40 MG capsule TAKE 1 CAPSULE BY MOUTH TWICE DAILY 180 capsule 3  . famotidine (PEPCID) 10 MG tablet Take 10 mg by mouth at bedtime.    Marland Kitchen HYDROcodone-acetaminophen (NORCO/VICODIN) 5-325 MG tablet Take 1 tablet by mouth every 8 (eight) hours as needed for moderate pain.    Marland Kitchen levothyroxine (SYNTHROID, LEVOTHROID) 200 MCG tablet TAKE 1 TABLET BY MOUTH DAILY BEFORE BREAKFAST. 90 tablet 3  . losartan (COZAAR) 50 MG tablet TAKE 1 TABLET BY MOUTH ONCE DAILY FOR HYPERTENSION 90 tablet 3  . metoprolol tartrate (LOPRESSOR) 25 MG tablet Take 1 tablet (25 mg total) by mouth every morning. 90 tablet 3  . montelukast (SINGULAIR) 10 MG tablet Take 1 tablet (10 mg total) by mouth at bedtime. 90 tablet 1  . ondansetron (ZOFRAN) 4 MG tablet Take 1 tablet (4 mg total) by mouth every 8 (eight) hours as needed for nausea or vomiting. 30 tablet 0  . OSCIMIN SR 0.375 MG 12 hr tablet Take 0.357 mg by mouth daily.  3  . PRESCRIPTION MEDICATION Take 600 mg by mouth at bedtime. Med Name: ZYFEO    . Rivaroxaban 15 & 20 MG TBPK Take as directed on package: Start with one 15mg  tablet by mouth twice a day with food. On Day 22, switch to one 20mg  tablet once a day with food. 51 each 0  . traZODone (DESYREL) 150 MG tablet Take 1 tablet (150 mg total) by mouth at bedtime. 90 tablet 3  . VENTOLIN HFA 108 (90 Base) MCG/ACT inhaler INHALE 2 PUFFS INTO THE LUNGS EVERY 6 HOURS AS NEEDED FOR WHEEZING. 18 g 11  . zolpidem (AMBIEN) 10 MG tablet TAKE 1 TABLET BY MOUTH ONCE DAILY AT BEDTIME AS NEEDED 90 tablet 3  . aspirin-acetaminophen-caffeine (EXCEDRIN MIGRAINE) 893-810-17 MG per tablet Take 1 tablet by mouth every 6 (six) hours as needed for headache (or pain).     . clobetasol ointment (TEMOVATE) 5.10 % Apply 1 application topically 2 (two) times daily. (Patient not taking: Reported on 02/01/2017) 30 g 0  . furosemide (LASIX) 20 MG tablet Take 1 tablet (20 mg total) by mouth daily. (Patient not taking:  Reported on 02/01/2017) 90 tablet 3   No facility-administered medications prior to visit.      Allergies:   Levaquin [levofloxacin]; Lisinopril; Bee venom; and Morphine and related   Social History   Social History  . Marital status: Single    Spouse name: N/A  . Number of children: N/A  . Years of education: N/A   Social History Main Topics  . Smoking status: Former Smoker    Packs/day: 0.50    Years: 6.00    Types: Cigarettes    Quit date: 11/14/1982  . Smokeless  tobacco: Never Used     Comment: quit smoking 30 years ago  . Alcohol use Yes     Comment:  very rare   . Drug use: No  . Sexual activity: Not Asked   Other Topics Concern  . None   Social History Narrative  . None     Family History:  The patient's family history includes Breast cancer in her paternal aunt; Diabetes in her father; Heart disease in her mother; Kidney failure in her father.      ROS:   Please see the history of present illness.    ROS All other systems reviewed and are negative.   PHYSICAL EXAM:   VS:  BP (!) 150/82   Pulse 73   Ht 5\' 5"  (1.651 m)   Wt (!) 300 lb 6.4 oz (136.3 kg)   BMI 49.99 kg/m    GEN: Well nourished, well developed, in no acute distress  HEENT: normal  Neck: no JVD, carotid bruits, or masses Cardiac: RRR; no murmurs, rubs, or gallops,no edema  Respiratory:  clear to auscultation bilaterally, normal work of breathing GI: soft, nontender, nondistended, + BS MS: no deformity or atrophy  Skin: warm and dry, no rash Neuro:  Alert and Oriented x 3, Strength and sensation are intact Psych: euthymic mood, full affect    Wt Readings from Last 3 Encounters:  02/01/17 (!) 300 lb 6.4 oz (136.3 kg)  01/27/17 289 lb (131.1 kg)  01/22/17 297 lb 1.6 oz (134.8 kg)      Studies/Labs Reviewed:   EKG:  EKG is NOT ordered today.   Recent Labs: 01/19/2017: ALT 18; B Natriuretic Peptide 16.3; Magnesium 1.5; TSH 1.449 01/20/2017: BUN 10; Creatinine, Ser 1.05; Potassium 4.1;  Sodium 140 01/22/2017: Hemoglobin 12.4; Platelets 271   Lipid Panel    Component Value Date/Time   CHOL 192 05/25/2016 1213   TRIG 194 (H) 05/25/2016 1213   HDL 43 (L) 05/25/2016 1213   CHOLHDL 4.5 05/25/2016 1213   VLDL 39 (H) 05/25/2016 1213   [place] 110 05/25/2016 1213    Additional studies/ records that were reviewed today include:  2D ECHO: 01/18/2017 LV EF: 60% -   65% Study Conclusion - Left ventricle: The cavity size was normal. There was mild   concentric hypertrophy. Systolic function was normal. The   estimated ejection fraction was in the range of 60% to 65%. Wall   motion was normal; there were no regional wall motion   abnormalities. Doppler parameters are consistent with abnormal   left ventricular relaxation (grade 1 diastolic dysfunction).   There was no evidence of elevated ventricular filling pressure by   Doppler parameters. - Aortic valve: Trileaflet; normal thickness leaflets. There was no   regurgitation. - Aortic root: The aortic root was normal in size. - Mitral valve: There was trivial regurgitation. - Left atrium: The atrium was normal in size. - Right ventricle: The cavity size was moderately dilated. Wall   thickness was normal. Systolic function was mildly reduced. - Tricuspid valve: There was mild regurgitation. - Pulmonary arteries: Systolic pressure was severely increased. PA   peak pressure: 69 mm Hg (S). - Inferior vena cava: The vessel was normal in size. - Pericardium, extracardiac: There was no pericardial effusion. Impressions: - Normal LVEF 55-60%.   Moderately dilated RV with mildly decreased RVEF.   Severe pulmonary hypertension suspicious of underlying pulmonary   etiology.  Nuclear stress test 01/18/17 Study Highlights   Nuclear stress EF: 69%.  There was  no ST segment deviation noted during stress.  Defect 1: There is a medium defect of moderate severity.  This is a low risk study.  The left ventricular ejection  fraction is hyperdynamic (>65%).   Normal stress nuclear study with breast attenuation but no ischemia; EF 69 with normal wall motion.    CT angio 01/19/17 IMPRESSION: 1. Multiple right lung pulmonary emboli as described above. 2. Positive for acute PE with CT evidence of right heart strain (RV/LV Ratio = 1) consistent with at least submassive (intermediate risk) PE. The presence of right heart strain has been associated with an increased risk of morbidity and mortality. Please activate Code PE by paging [phone]. 3. Findings compatible with pulmonary arterial hypertension. Critical Value/emergent results were called by telephone at the time of interpretation on 01/19/2017 at 2:07 pm to Dr. Denton Meek, who verbally acknowledged these results.    ASSESSMENT & PLAN:   Acute PE with heart strain: complaint with Xarelto. Has starter pack but will need Xxarelto 20mg  daily called in. She see's Dr. [name] for clotting disorder workup. She would like to have a repeat an echo in 3-6 months to see if pulmonary pressures and RV is better, which I think is reasonable.   HTN: BP elevated today at 150/82 but down to 130/80 on my personal recheck.   HLD: continue statin   Morbid obesity: Body mass index is 49.99 kg/m. Diet and weight loss recommended.     Medication Adjustments/Labs and Tests Ordered: Current medicines are reviewed at length with the patient today.  Concerns regarding medicines are outlined above.  Medication changes, Labs and Tests ordered today are listed in the Patient Instructions below. Patient Instructions  Medication Instructions:  Your physician recommends that you continue on your current medications as directed. Please refer to the Current Medication list given to you today.   Labwork: NONE ORDERED  Testing/Procedures: Your physician has requested that you have an echocardiogram. Echocardiography is a painless test that uses sound waves to create images of  your heart. It provides your doctor with information about the size and shape of your heart and how well your heart's chambers and valves are working. This procedure takes approximately one hour. There are no restrictions for this procedure. THIS IS TO BE DONE IN 6 MONTHS;     Follow-Up: Your physician wants you to follow-up in: [place] AFTER ECHO HAS BEEN COMPLETED. You will receive a reminder letter in the mail two months in advance. If you don't receive a letter, please call our office to schedule the follow-up appointment.   Any Other Special Instructions Will Be Listed Below (If Applicable).     If you need a refill on your cardiac medications before your next appointment, please call your pharmacy.      Signed, Angelena Form, PA-C  02/01/2017 5:18 PM    [facility] [address], [address], [place]  [address] Phone: [phone]; Fax: [phone]

## 2017-02-01 ENCOUNTER — Ambulatory Visit (INDEPENDENT_AMBULATORY_CARE_PROVIDER_SITE_OTHER): Payer: BLUE CROSS/BLUE SHIELD | Admitting: Physician Assistant

## 2017-02-01 ENCOUNTER — Encounter: Payer: Self-pay | Admitting: Physician Assistant

## 2017-02-01 VITALS — BP 150/82 | HR 73 | Ht 65.0 in | Wt 300.4 lb

## 2017-02-01 DIAGNOSIS — I2699 Other pulmonary embolism without acute cor pulmonale: Secondary | ICD-10-CM | POA: Diagnosis not present

## 2017-02-01 DIAGNOSIS — E1100 Type 2 diabetes mellitus with hyperosmolarity without nonketotic hyperglycemic-hyperosmolar coma (NKHHC): Secondary | ICD-10-CM

## 2017-02-01 DIAGNOSIS — E785 Hyperlipidemia, unspecified: Secondary | ICD-10-CM

## 2017-02-01 MED ORDER — RIVAROXABAN 20 MG PO TABS: 20.0000 mg | ORAL_TABLET | Freq: Every day | ORAL | 3 refills | Status: DC

## 2017-02-01 NOTE — Addendum Note (Signed)
Addended by: Michae Kava on: 02/01/2017 06:29 PM   Modules accepted: Orders

## 2017-02-01 NOTE — Patient Instructions (Addendum)
Medication Instructions:  CHANGE XARELTO TO 20 MG DAILY AT SUPPER TIME.  Labwork: NONE ORDERED  Testing/Procedures: Your physician has requested that you have an echocardiogram. Echocardiography is a painless test that uses sound waves to create images of your heart. It provides your doctor with information about the size and shape of your heart and how well your heart's chambers and valves are working. This procedure takes approximately one hour. There are no restrictions for this procedure. THIS IS TO BE DONE IN 6 MONTHS;     Follow-Up: Your physician wants you to follow-up in: [place] AFTER ECHO HAS BEEN COMPLETED. You will receive a reminder letter in the mail two months in advance. If you don't receive a letter, please call our office to schedule the follow-up appointment.   Any Other Special Instructions Will Be Listed Below (If Applicable).     If you need a refill on your cardiac medications before your next appointment, please call your pharmacy.

## 2017-02-04 NOTE — Progress Notes (Signed)
   Subjective:    Patient ID: [name], female    DOB: 1950/11/26, 66 y.o.   MRN: [id number]  HPI 66 year old Female in today for hospitalization follow-up.Patient presented to the office February 13 accompanied my her sister with significant shortness of breath and desaturation of oxygen level when she walked down the hall. She had noticed she gets short of breath with minimal activity but admitted she had not been exercising since retirement. Also complaining of getting fatigued easily. Denied chest pain. O2 sat fell to 95% while walking.  She has a history of GE reflux, depression, B-12 deficiency, insomnia, ulcerative colitis, hypokalemia, migraine headaches and asthma.  History of positive allergy skin test to dust mite and mold. Positive response to albuterol with improvement in FEV1 during spirometry testing in the past.  Does not smoke. She weighs 295 pounds.  When I saw her on February 13 I thought she perhaps a congestive heart failure. I thought she had JVD. She had trace pitting edema and cardiac exam was normal.  Check thyroid functions TSH and BNP. BNP was very slightly elevated at 167.6. White blood cell count was stable. Be met was normal. Not anemic. Nonfasting glucose 112 and creatinine 1.00. She had a chest x-ray that was negative. I arranged for her to see Cardiology  Mother had CVA, heart failure and coronary artery disease as well as pacemaker. She died from complications of heart failure.  She was seen by cardiology PA on February 15. She had a 2-D echocardiogram showing mildly dilated right ventricle and mildly decreased right ventricular ejection fraction with severe pulmonary hypertension suggesting pulmonary etiology. CT angioma was ordered. She saw Dr. Pernell Dupre and was admitted to the hospital with findings of multiple right lung pulmonary emboli.  She was anticoagulated and is now discharged home for follow-up today on sore. I think it would be appropriate  to keep her on that for 6 months. However going to arrange for her to see hematologist, Dr. [name] for an opinion on appropriate length of anticoagulation with her multiple medical issues    Review of Systems feels better but still fatigued     Objective:   Physical Exam Skin warm and dry. Nodes none. Neck is supple. Chest clear to auscultation without rales or wheezing. No significant lower extremity edema.       Assessment & Plan:  Multiple pulmonary emboli causing pulmonary hypertension and shortness of breath  Morbid obesity  Hypothyroidism  Chronic anticoagulation  Anxiety depression  Hypertension  Insomnia  Impaired glucose tolerance-11 A1c 6%  Hypertriglyceridemia-treated with diet  Plan: Continue follow-up with Cardiology. Appointment will be arranged with Dr. Gilda Crease. Had physical exam here in September.

## 2017-02-10 NOTE — Patient Instructions (Signed)
Return in September for physical exam. Watch diet. Try to walk some and get her strength back. Follow-up with cardiology and hematology.

## 2017-02-27 ENCOUNTER — Encounter: Payer: Self-pay | Admitting: Oncology

## 2017-02-27 ENCOUNTER — Ambulatory Visit (INDEPENDENT_AMBULATORY_CARE_PROVIDER_SITE_OTHER): Payer: BLUE CROSS/BLUE SHIELD | Admitting: Oncology

## 2017-02-27 VITALS — BP 152/70 | HR 84 | Temp 98.0°F | Ht 65.0 in | Wt 307.0 lb

## 2017-02-27 DIAGNOSIS — Z885 Allergy status to narcotic agent status: Secondary | ICD-10-CM

## 2017-02-27 DIAGNOSIS — I2699 Other pulmonary embolism without acute cor pulmonale: Secondary | ICD-10-CM

## 2017-02-27 DIAGNOSIS — Z87891 Personal history of nicotine dependence: Secondary | ICD-10-CM | POA: Diagnosis not present

## 2017-02-27 DIAGNOSIS — Z881 Allergy status to other antibiotic agents status: Secondary | ICD-10-CM

## 2017-02-27 DIAGNOSIS — Z8249 Family history of ischemic heart disease and other diseases of the circulatory system: Secondary | ICD-10-CM | POA: Diagnosis not present

## 2017-02-27 DIAGNOSIS — Z91030 Bee allergy status: Secondary | ICD-10-CM

## 2017-02-27 DIAGNOSIS — Z8349 Family history of other endocrine, nutritional and metabolic diseases: Secondary | ICD-10-CM

## 2017-02-27 DIAGNOSIS — Z888 Allergy status to other drugs, medicaments and biological substances status: Secondary | ICD-10-CM | POA: Diagnosis not present

## 2017-02-27 DIAGNOSIS — Z9049 Acquired absence of other specified parts of digestive tract: Secondary | ICD-10-CM | POA: Diagnosis not present

## 2017-02-27 DIAGNOSIS — Z8379 Family history of other diseases of the digestive system: Secondary | ICD-10-CM

## 2017-02-27 DIAGNOSIS — Z825 Family history of asthma and other chronic lower respiratory diseases: Secondary | ICD-10-CM

## 2017-02-27 DIAGNOSIS — Z6841 Body Mass Index (BMI) 40.0 and over, adult: Secondary | ICD-10-CM | POA: Diagnosis not present

## 2017-02-27 DIAGNOSIS — M1711 Unilateral primary osteoarthritis, right knee: Secondary | ICD-10-CM

## 2017-02-27 DIAGNOSIS — Z90710 Acquired absence of both cervix and uterus: Secondary | ICD-10-CM

## 2017-02-27 DIAGNOSIS — I2602 Saddle embolus of pulmonary artery with acute cor pulmonale: Secondary | ICD-10-CM

## 2017-02-27 DIAGNOSIS — Z823 Family history of stroke: Secondary | ICD-10-CM

## 2017-02-27 NOTE — Progress Notes (Signed)
Dr. [name] wants pt to be evaluated for sleep apnea. Please put in referral to Pulmonary.

## 2017-02-27 NOTE — Patient Instructions (Signed)
To lab today Return visit 6 months 

## 2017-02-27 NOTE — Progress Notes (Signed)
New Patient Hematology   [name] [id number] [birth year] 66 y.o. 02/27/2017  CC: Dr. Tommie Ard Baxley; Dr. Silvano Rusk   Reason for referral: Recent pulmonary embolism, advice on anticoagulation   HPI:  Pleasant, morbidly obese BMI 51.2, woman who began to notice the indolent onset of dyspnea back in January 2018.  Symptoms progressed.  She reported them to her internist in March.  She also began to notice some swelling and pain in the right popliteal fossa.  She has been having increasing problems with pain due to degenerative arthritis of her knee.  She did not think much of the pain.  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  She thought perhaps she had a Baker's cyst.  She had no chest pain, cough, or hemoptysis.  She was never a heavy smoker and stopped smoking completely over 35 years ago. March 7 she had an echocardiogram which showed normal left ventricular function with ejection fraction 60-65% with normal wall motion.  Grade 1 diastolic dysfunction.  There was moderate dilatation of the right ventricle with mild reduction in systolic function.  Significant elevation of pulmonary artery pressure at 69 mm.  Main pulmonary artery size was normal. On March 8 she had a low risk Myoview study with cardiac ejection fraction 69%. Abnormal right heart function suggested possibility of a pulmonary embolus.  CT angiogram done the same day March 8 showed multiple emboli in the right lung.  There was evidence for right heart strain with RV/LV ratio of 1.  She was admitted.  Treated with unfractionated heparin for 24 hours.  She was then started on Xarelto.  Venous Doppler studies of the lower extremities did not reveal any clot on either side.  She has some degenerative arthritis of her right knee.  No signs or symptoms of a collagen vascular disorder. She is up-to-date on her health maintenance exams.  She is known to have severe diverticulosis and did  require a partial sigmoid colon resection in the past for this.  However most recent colonoscopy was done in May 2014 and showed no significant abnormalities.  She just had mammograms on December 15, 2016 which were unremarkable.  She has had no change in bowel habit.  No hematochezia or melena.  No hematuria.  She is status post hysterectomy without oophorectomy.  She has not had a pelvic exam in a while.  She denies any vaginal bleeding. She has no constitutional symptoms.  Appetite is good. She does lead a rather sedentary lifestyle.  Her main exercise is walking her dog.  There is no family history of any blood clots.  Mother lived until age 83.  She had coronary disease.  Strokes.  Atrial fibrillation.  She did develop a left atrial thrombus and was on warfarin.  Father lived until age 8.  Died of complications of COPD.  She has a sister age 7.  Both of her sister's daughters have Crohn's disease.  She has a brother age 60 who is healthy.  Son age 71 who has thyroid disease and atrial fibrillation.   PMH: Past Medical History:  Diagnosis Date  . Anemia    none recently  . Anxiety   . Arthritis   . Asthma   . Complete tear of right rotator cuff 12/05/2014  . Depression   . Diabetes mellitus    not on meds.denies a1c 5.8  . Diverticulitis   . Diverticulosis   . Family history of adverse reaction  to anesthesia    mom  nausea and vomiting  . GERD (gastroesophageal reflux disease)   . Hypertension   . Hypothyroidism   . Migraine   . Personal history of kidney stones   . Pneumonia    hx  . Pulmonary embolism ([place])    a. diagnosed in 01/2017 w/ imaging showing multiple right lung PE with right heart strain. Started on Xarelto.   . Shortness of breath dyspnea    exersion  . Thyroid disease    hypo  . Wears glasses     Past Surgical History:  Procedure Laterality Date  . ABDOMINAL HYSTERECTOMY    . CATARACT EXTRACTION, BILATERAL    . CESAREAN SECTION    . COLONOSCOPY      multiple   . ESOPHAGOGASTRODUODENOSCOPY ENDOSCOPY     multiple  . KNEE CARTILAGE SURGERY     Left  . LAPAROSCOPIC PARTIAL COLECTOMY N/A 04/22/2013   Procedure: LAPAROSCOPIC PARTIAL COLECTOMY;  Surgeon: Odis Hollingshead, MD;  Location: [facility];  Service: General;  Laterality: N/A;  . right foot surgery  little toe and next toe   x 3  . SHOULDER ARTHROSCOPY WITH ROTATOR CUFF REPAIR AND SUBACROMIAL DECOMPRESSION Right 12/05/2014   Procedure: RIGHT SHOULDER ARTHROSCOPY,ACROMOPLASTY, ROTATOR CUFF REPAIR;  Surgeon: [name], MD;  Location: [facility];  Service: Orthopedics;  Laterality: Right;    Allergies: Allergies  Allergen Reactions  . Levaquin [Levofloxacin] Anaphylaxis and Other (See Comments)    Possibly Levaquin or Lisinopril, pt was taking both at the time of reaction  . Lisinopril Anaphylaxis and Other (See Comments)    Possibly Lisinopril or Levaquin, pt was taking both at the time of reaction  . Bee Venom Swelling  . Morphine And Related Itching and Other (See Comments)    Needs benadryl prior to     Medications:  Current Outpatient Prescriptions:  .  ALPRAZolam (XANAX) 0.5 MG tablet, Take 1 tablet (0.5 mg total) by mouth 2 (two) times daily as needed., Disp: 180 tablet, Rfl: 3 .  cetirizine (ZYRTEC) 10 MG tablet, Take 10 mg by mouth at bedtime. , Disp: , Rfl:  .  Cholecalciferol (VITAMIN D3) 5000 units CAPS, Take 5,000 Units by mouth daily., Disp: , Rfl:  .  clobetasol ointment (TEMOVATE) 0.27 %, Apply 1 application topically 2 (two) times daily as needed (skin irritation)., Disp: , Rfl:  .  EPINEPHRINE 0.3 mg/0.3 mL IJ SOAJ injection, INJECT 0.3 MLS INTO THE MUSCLE ONCE., Disp: 2 Device, Rfl: PRN .  escitalopram (LEXAPRO) 10 MG tablet, Take 1 tablet (10 mg total) by mouth daily., Disp: 90 tablet, Rfl: 3 .  esomeprazole (NEXIUM) 40 MG capsule, TAKE 1 CAPSULE BY MOUTH TWICE DAILY, Disp: 180 capsule, Rfl: 3 .  famotidine (PEPCID) 10 MG tablet, Take 10 mg by  mouth at bedtime., Disp: , Rfl:  .  furosemide (LASIX) 40 MG tablet, Take 40 mg by mouth daily., Disp: , Rfl:  .  HYDROcodone-acetaminophen (NORCO/VICODIN) 5-325 MG tablet, Take 1 tablet by mouth every 8 (eight) hours as needed for moderate pain., Disp: , Rfl:  .  levothyroxine (SYNTHROID, LEVOTHROID) 200 MCG tablet, TAKE 1 TABLET BY MOUTH DAILY BEFORE BREAKFAST., Disp: 90 tablet, Rfl: 3 .  losartan (COZAAR) 50 MG tablet, TAKE 1 TABLET BY MOUTH ONCE DAILY FOR HYPERTENSION, Disp: 90 tablet, Rfl: 3 .  metoprolol tartrate (LOPRESSOR) 25 MG tablet, Take 1 tablet (25 mg total) by mouth every morning., Disp: 90 tablet, Rfl: 3 .  montelukast (SINGULAIR) 10 MG tablet, Take 1 tablet (10 mg total) by mouth at bedtime., Disp: 90 tablet, Rfl: 1 .  ondansetron (ZOFRAN) 4 MG tablet, Take 1 tablet (4 mg total) by mouth every 8 (eight) hours as needed for nausea or vomiting., Disp: 30 tablet, Rfl: 0 .  OSCIMIN SR 0.375 MG 12 hr tablet, Take 0.357 mg by mouth daily., Disp: , Rfl: 3 .  PRESCRIPTION MEDICATION, Take 600 mg by mouth at bedtime. Med Name: ZYFEO, Disp: , Rfl:  .  rivaroxaban (XARELTO) 20 MG TABS tablet, Take 1 tablet (20 mg total) by mouth daily with supper., Disp: 90 tablet, Rfl: 3 .  traZODone (DESYREL) 150 MG tablet, Take 1 tablet (150 mg total) by mouth at bedtime., Disp: 90 tablet, Rfl: 3 .  VENTOLIN HFA 108 (90 Base) MCG/ACT inhaler, INHALE 2 PUFFS INTO THE LUNGS EVERY 6 HOURS AS NEEDED FOR WHEEZING., Disp: 18 g, Rfl: 11 .  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  One son aged 53.  reports that she quit smoking about 35 years ago. Marland Kitchen She was never a heavy smoker.  she does not drink alcohol or use drugs.  Family History: Family History  Problem Relation Age of Onset  . Heart disease Mother   . Kidney failure Father   . Diabetes  Father   . Breast cancer Paternal Aunt   . Colon cancer Neg Hx     Review of Systems: See HPI She still has some mild dyspnea which she is attributing to seasonal allergies.  No pleuritic chest discomfort.  No chest tightness or palpitations.  No recurrence of leg swelling or pain.  Chronic pain in her right knee from arthritis. Remaining ROS negative.  Physical Exam: Blood pressure (!) 152/70, pulse 84, temperature 98 F (36.7 C), temperature source Oral, height 5\' 5"  (1.651 m), weight (!) 307 lb (139.3 kg), SpO2 95 %. Wt Readings from Last 3 Encounters:  02/27/17 (!) 307 lb (139.3 kg)  02/01/17 (!) 300 lb 6.4 oz (136.3 kg)  01/27/17 289 lb (131.1 kg)     General appearance: obese Caucasian woman HENNT: Pharynx no erythema, exudate, mass, or ulcer. No thyromegaly or thyroid nodules Lymph nodes: No cervical, supraclavicular, or axillary lymphadenopathy Breasts:  Lungs: Clear to auscultation, resonant to percussion throughout Heart: Regular rhythm, no murmur, no gallop, no rub, no click, no edema Abdomen: Soft, nontender, normal bowel sounds, no mass, no organomegaly Extremities: No edema, no calf tenderness Musculoskeletal: Positive knee joint deformities GU:  Vascular: Carotid pulses 2+, no bruits, distal pulses: Dorsalis pedis 1+ symmetric Neurologic: Alert, oriented, PERRLA, optic discs sharp and vessels normal, no hemorrhage or exudate, cranial nerves grossly normal, motor strength 5 over 5, reflexes 1+ symmetric at the biceps, absent symmetric at the knees, upper body coordination normal, gait unsteady due to weight & arthritis Skin: No rash or ecchymosis   Lab Results: Lab Results  Component Value Date   WBC 9.8 01/22/2017   HGB 12.4 01/22/2017   HCT 40.2 01/22/2017   MCV 85.5 01/22/2017   PLT 271 01/22/2017     Chemistry      Component Value Date/Time   NA 140 01/20/2017 1603   K 4.1 01/20/2017 1603   CL 105 01/20/2017 1603   CO2 26 01/20/2017 1603   BUN 10  01/20/2017  1603   CREATININE 1.05 (H) 01/20/2017 1603   CREATININE 1.04 (H) 12/27/2016 1241      Component Value Date/Time   CALCIUM 9.2 01/20/2017 1603   ALKPHOS 68 01/19/2017 1948   AST 26 01/19/2017 1948   ALT 18 01/19/2017 1948   BILITOT 0.4 01/19/2017 1948      Radiological Studies: See discussion above     Impression: Unprovoked extensive right sided pulmonary emboli Probably originating from the rightcalf veins although no clot seen at time of venous Doppler studies. Risk factors are morbid obesity, sedentary lifestyle, and her age. Recently, obstructive sleep apnea has been included in the list of risk factors for pulmonary emboli.    Recommendation: She needs to be on full dose anticoagulation for 6-12 months at the minimum. Since the known risk factors that probably led to this event are not expected to change over time, she would best be served by long-term anticoagulation. She should have a sleep study to rule out obstructive sleep apnea if this has not already been done. No special hematology laboratory evaluation will inform this decision.  I will check for presence of antiphospholipid antibodies but the other genetic tests even if positive, will not be contributory to helping make a decision on duration of anticoagulation.  She is concerned about possibility of occult malignancy.  I told her that malignancy as the first sign of blood clots occurs in less than 5% of people presenting with blood clots.  Other than a complete exam and staying current on health maintenance evaluations, no additional scanning is indicated. CT of the chest did not show any pulmonary pathology. . I will see her back in 6 months to reevaluate.     Murriel Hopper, MD, [facility]-Oncology/Internal Medicine  02/27/2017, 3:05 PM

## 2017-03-02 LAB — BETA-2-GLYCOPROTEIN I ABS, IGG/M/A
Beta-2 Glyco 1 IgA: <9 GPI IgA units (ref 0–25)
Beta-2 Glyco 1 IgM: 12 GPI IgM units (ref 0–32)
Beta-2 Glyco I IgG: <9 GPI IgG units (ref 0–20)

## 2017-03-02 LAB — CBC WITH DIFFERENTIAL/PLATELET
BASOS: 1 %
Basophils Absolute: 0.1 10*3/uL (ref 0.0–0.2)
EOS (ABSOLUTE): 0.5 10*3/uL — ABNORMAL HIGH (ref 0.0–0.4)
EOS: 5 %
HEMOGLOBIN: 13.6 g/dL (ref 11.1–15.9)
Hematocrit: 41.2 % (ref 34.0–46.6)
IMMATURE GRANS (ABS): 0 10*3/uL (ref 0.0–0.1)
Immature Granulocytes: 0 %
LYMPHS ABS: 3.7 10*3/uL — AB (ref 0.7–3.1)
LYMPHS: 35 %
MCH: 26.9 pg (ref 26.6–33.0)
MCHC: 33 g/dL (ref 31.5–35.7)
MCV: 81 fL (ref 79–97)
MONOCYTES: 7 %
Monocytes Absolute: 0.8 10*3/uL (ref 0.1–0.9)
NEUTROS ABS: 5.6 10*3/uL (ref 1.4–7.0)
Neutrophils: 52 %
Platelets: 341 10*3/uL (ref 150–379)
RBC: 5.06 x10E6/uL (ref 3.77–5.28)
RDW: 15 % (ref 12.3–15.4)
WBC: 10.7 10*3/uL (ref 3.4–10.8)

## 2017-03-02 LAB — CARDIOLIPIN ANTIBODIES, IGG, IGM, IGA
Anticardiolipin IgG: <9 GPL U/mL (ref 0–14)
Anticardiolipin IgM: <9 MPL U/mL (ref 0–12)

## 2017-03-09 ENCOUNTER — Telehealth: Payer: Self-pay | Admitting: *Deleted

## 2017-03-09 NOTE — Telephone Encounter (Signed)
Pt called / informed "special blood tests do not show presence of any antibodies to clotting factors - this is a good thing" per Dr [name]. Stated thank-you for calling.

## 2017-03-09 NOTE — Telephone Encounter (Signed)
-----   Message from Annia Belt, MD sent at 03/09/2017  9:28 AM EDT ----- Call pt: special blood tests do not show presence of any antibodies to clotting factors - this is a good thing

## 2017-04-06 ENCOUNTER — Encounter: Payer: Self-pay | Admitting: Internal Medicine

## 2017-04-07 ENCOUNTER — Telehealth: Payer: Self-pay

## 2017-04-07 MED ORDER — ZOLPIDEM TARTRATE 10 MG PO TABS: ORAL_TABLET | ORAL | 1 refills | Status: DC

## 2017-04-07 MED ORDER — ALPRAZOLAM 0.5 MG PO TABS: 0.5000 mg | ORAL_TABLET | Freq: Two times a day (BID) | ORAL | 1 refills | Status: DC | PRN

## 2017-04-07 NOTE — Telephone Encounter (Signed)
Both called into pharmacy for 90 days with one refill.

## 2017-07-20 ENCOUNTER — Encounter: Payer: Self-pay | Admitting: Internal Medicine

## 2017-07-20 ENCOUNTER — Ambulatory Visit (INDEPENDENT_AMBULATORY_CARE_PROVIDER_SITE_OTHER): Payer: Medicare Other | Admitting: Internal Medicine

## 2017-07-20 VITALS — BP 140/84 | HR 78 | Temp 99.0°F | Ht 65.0 in | Wt 304.0 lb

## 2017-07-20 DIAGNOSIS — E039 Hypothyroidism, unspecified: Secondary | ICD-10-CM | POA: Diagnosis not present

## 2017-07-20 DIAGNOSIS — E1100 Type 2 diabetes mellitus with hyperosmolarity without nonketotic hyperglycemic-hyperosmolar coma (NKHHC): Secondary | ICD-10-CM

## 2017-07-20 DIAGNOSIS — Z0000 Encounter for general adult medical examination without abnormal findings: Secondary | ICD-10-CM | POA: Diagnosis not present

## 2017-07-20 DIAGNOSIS — F329 Major depressive disorder, single episode, unspecified: Secondary | ICD-10-CM

## 2017-07-20 DIAGNOSIS — K219 Gastro-esophageal reflux disease without esophagitis: Secondary | ICD-10-CM | POA: Diagnosis not present

## 2017-07-20 DIAGNOSIS — M2550 Pain in unspecified joint: Secondary | ICD-10-CM

## 2017-07-20 DIAGNOSIS — E2839 Other primary ovarian failure: Secondary | ICD-10-CM

## 2017-07-20 DIAGNOSIS — M25551 Pain in right hip: Secondary | ICD-10-CM | POA: Diagnosis not present

## 2017-07-20 DIAGNOSIS — F32A Depression, unspecified: Secondary | ICD-10-CM

## 2017-07-20 DIAGNOSIS — M25552 Pain in left hip: Secondary | ICD-10-CM

## 2017-07-20 DIAGNOSIS — E876 Hypokalemia: Secondary | ICD-10-CM | POA: Diagnosis not present

## 2017-07-20 DIAGNOSIS — I10 Essential (primary) hypertension: Secondary | ICD-10-CM

## 2017-07-20 DIAGNOSIS — J309 Allergic rhinitis, unspecified: Secondary | ICD-10-CM

## 2017-07-20 DIAGNOSIS — Z23 Encounter for immunization: Secondary | ICD-10-CM | POA: Diagnosis not present

## 2017-07-20 DIAGNOSIS — M25561 Pain in right knee: Secondary | ICD-10-CM

## 2017-07-20 DIAGNOSIS — G8929 Other chronic pain: Secondary | ICD-10-CM

## 2017-07-20 DIAGNOSIS — E785 Hyperlipidemia, unspecified: Secondary | ICD-10-CM | POA: Diagnosis not present

## 2017-07-20 DIAGNOSIS — M25562 Pain in left knee: Secondary | ICD-10-CM

## 2017-07-20 LAB — POCT URINALYSIS DIPSTICK
Bilirubin, UA: NEGATIVE
GLUCOSE UA: NEGATIVE
Ketones, UA: NEGATIVE
Leukocytes, UA: NEGATIVE
NITRITE UA: NEGATIVE
PH UA: 6 (ref 5.0–8.0)
Protein, UA: NEGATIVE
RBC UA: NEGATIVE
SPEC GRAV UA: 1.025 (ref 1.010–1.025)
UROBILINOGEN UA: 0.2 U/dL

## 2017-07-20 NOTE — Progress Notes (Signed)
Subjective:    Patient ID: [name], female    DOB: 1951/02/28, 66 y.o.   MRN: [id number]  HPI   66 year old White Female for Welcome to medicare exam and evaluation of medical issues. Has NOT taken BP meds this am and will need to return for OV and BP check.  Sees Cardiologist and doing well. EKG shows occasional PACs Declines flu vaccine will get Prevnar 13  Hx osteoarthritis both knees- needs right knee arthroplasty. Has pain right index finger and shoulder pain. Agrreable to seeing rheumatologist.  Has lesion left face for which she will see dermatologist in the near future.  Hx lichen planus in mouth comes and goes.  Sees Cardiology in October.  Still on chronic anticoagulation for PE.Will be seeing Dr. [name]  soon for follow-up.  She had planned to have right knee arthroplasty in August 2017 but that was canceled. At some point this needs to be done to chest. On chronic anticoagulation therapy.  History of GE reflux, depression, insomnia, ulcerative colitis, hypokalemia, history of migraine headaches, history of asthma, history of B-12 deficiency. In 2016 level was 33.  History of right shoulder arthroscopic surgery, acromioplasty and rotator cuff repair January 2016 by Dr. Mardelle Matte. Recovered from that surgery took some time. 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  History of diverticulitis in the proximal descending colon status post laparoscopic assisted left colectomy June 2014.  History of angioedema.  Past medical history: Fractured left radial head 1995, epicondylitis right elbow in the past, strep throat January 2005, right upper lobe pneumonia March 2011, C-section 1977, abdominal hysterectomy with right salpingectomy January 1998, history of bilateral cataract extractions. Repair of left knee medial meniscus June 2008.  Patient was admitted December 2007 with syncope. She had herpes zoster July 2007.  Hospitalized with probable viral Freddrick March enteritis April 2011. History of angioedema of the face February 2011. Surgery for chronic sinusitis November 2010. History of gallstones and hepatic steatosis. History of iron deficiency anemia that resolved after hysterectomy. History of noncardiac and nonpulmonary chest pain 2011. At the time had negative cardiac enzymes, negative EKG, negative CT angiography although her d-dimer was elevated.  Colonoscopy 2009 showed severe colitis in the left colon. She previously had panendoscopy January 28 and findings were seen in the terminal ileum that were thought to possibly be inflammatory bowel disease or from aspirin or anti-inflammatory medications. She had diverticulosis and small hemorrhoids. Pathology done on biopsy June 2009 revealed ulcerative colonic mucosa without granulomas. Differential included ischemic related  ulceration.  She has history of positive allergy skin test to dust mites and mold in 2009. Spirometry showed FVC of 2.61 which was 81% of predicted. FEV1 was 1.96 which was 75% of predicted. Following administration of inhaled albuterol, FEV1 improved to 93% of predicted.  Family history: Father deceased with history of stroke, hypertension, diabetes and COPD. Mother deceased with history of congestive heart failure and chronic kidney disease. One sister and one son in good health.  Patient has been seen in this office in September 1996.  Social history: She is divorced. She does not smoke. Occasional alcohol consumption. Long-standing history of morbid obesity. Weight in 1996 was 307 pounds. Weight in January 2011 was 290 pounds. Weight in January 2012 294 pounds.        Review of Systems  Constitutional: Positive for fatigue.  Respiratory: Positive for shortness of breath.   Cardiovascular: Negative for chest pain.  Gastrointestinal: Negative.   Genitourinary:       Urge urinary incontinence  Musculoskeletal: Positive for  arthralgias.  Neurological:       Headaches have improved  Psychiatric/Behavioral: Negative.   Knee pain continues to be an issue for her     Objective:   Physical Exam  Constitutional: She is oriented to person, place, and time. She appears well-developed and well-nourished. No distress.  HENT:  Head: Normocephalic and atraumatic.  Right Ear: External ear normal.  Left Ear: External ear normal.  Mouth/Throat: Oropharynx is clear and moist.  Eyes: Pupils are equal, round, and reactive to light. Conjunctivae and EOM are normal. Right eye exhibits no discharge. Left eye exhibits no discharge. No scleral icterus.  Neck: Neck supple. No JVD present. No thyromegaly present.  Cardiovascular: Normal rate, regular rhythm and normal heart sounds.   No murmur heard. Pulmonary/Chest: Effort normal and breath sounds normal. She has no wheezes. She has no rales.  Breasts normal female  Abdominal: Soft. Bowel sounds are normal. She exhibits no distension. There is no tenderness. There is no rebound and no guarding.  Musculoskeletal:  Trace lower extremity edema  Lymphadenopathy:    She has no cervical adenopathy.  Neurological: She is alert and oriented to person, place, and time. She has normal reflexes. No cranial nerve deficit.  Skin: Skin is warm and dry. She is not diaphoretic.  Psychiatric: She has a normal mood and affect. Her behavior is normal. Judgment and thought content normal.  Vitals reviewed.         Assessment & Plan:  History of bilateral pulmonary emboli currently on chronic anticoagulation to see Dr. [name] in near future about stopping anticoagulation after 6 months.  Morbid obesity  Diverticulitis of the proximal descending colon status post left colectomy June 2014  Essential hypertension  Morbid obesity  End-stage osteoarthritis of right knee needing arthroplasty  Adult onset diabetes mellitus-hemoglobin A1c stable at 6.1%  Hyperlipidemia-triglycerides  are elevated at 167  Hypothyroidism-TSH is stable on thyroid replacement  Migraine headaches-improved  History of asthma  History of lichen planus  History of nongranulomatous colitis  History of angioedema  GE reflux  Allergic rhinitis  History of vitamin D deficiency-however vitamin D level is normal  Recurrent low back pain  Dependent edema  Joint pain-to see rheumatologist in the near future. Rheumatology studies thus far show a rheumatoid factor slightly elevated at 19, sedimentation rate of 11. ANA is positive speckled with a titer of 1:40 but anti-DS DNA is negative  Plan: Hematology consultation to be scheduled around October. Continue same medications. Keep cardiology follow-up as recommended. Recommend annual mammogram. Recommend annual flu vaccine.  Subjective:   Patient presents for Medicare Annual/Subsequent preventive examination.  Review Past Medical/Family/Social:See above   Risk Factors Sedentary Dietary issues discussed: Low fat low carbohydrate  Cardiac risk factors:Hypertension hyperlipidemia, morbid obesity, family history  Depression Screen  (Note: if answer to either of the following is "Yes", a more complete depression screening is indicated)   Over the past two weeks, have you felt down, depressed or hopeless? No  Over the past two weeks, have you felt little interest or pleasure in doing things? No Have you lost interest or pleasure in daily life? No Do you often feel hopeless? No Do you cry easily over simple problems? No   Activities of Daily Living  In your present state of health, do you have any difficulty performing the following activities?:   Driving? No  Managing money? No  Feeding yourself? No  Getting from bed to chair? No  Climbing a flight of stairs? Sometimes due to shortness of breath and knee issues Preparing food and eating?: No  Bathing or showering? No  Getting dressed: No  Getting to the toilet? No  Using the  toilet:No  Moving around from place to place: No  In the past year have you fallen or had a near fall?:No  Are you sexually active? No  Do you have more than one partner? No   Hearing Difficulties: No  Do you often ask people to speak up or repeat themselves? No  Do you experience ringing or noises in your ears? No  Do you have difficulty understanding soft or whispered voices? No  Do you feel that you have a problem with memory? No Do you often misplace items? No    Home Safety:  Do you have a smoke alarm at your residence? Yes Do you have grab bars in the bathroom? Do you have throw rugs in your house?   Cognitive Testing  Alert? Yes Normal Appearance?Yes  Oriented to person? Yes Place? Yes  Time? Yes  Recall of three objects? Yes  Can perform simple calculations? Yes  Displays appropriate judgment?Yes  Can read the correct time from a watch face?Yes   List the Names of Other Physician/Practitioners you currently use:  See referral list for the physicians patient is currently seeing.     Review of Systems: See above   Objective:     General appearance: Appears stated age and mildly obese  Head: Normocephalic, without obvious abnormality, atraumatic  Eyes: conj clear, EOMi PEERLA  Ears: normal TM's and external ear canals both ears  Nose: Nares normal. Septum midline. Mucosa normal. No drainage or sinus tenderness.  Throat: lips, mucosa, and tongue normal; teeth and gums normal  Neck: no adenopathy, no carotid bruit, no JVD, supple, symmetrical, trachea midline and thyroid not enlarged, symmetric, no tenderness/mass/nodules  No CVA tenderness.  Lungs: clear to auscultation bilaterally  Breasts: normal appearance, no masses or tenderness Heart: regular rate and rhythm, S1, S2 normal, no murmur, click, rub or gallop  Abdomen: soft, non-tender; bowel sounds normal; no masses, no organomegaly  Musculoskeletal: ROM normal in all joints, no crepitus, no deformity,  Normal muscle strengthen. Back  is symmetric, no curvature. Skin: Skin color, texture, turgor normal. No rashes or lesions  Lymph nodes: Cervical, supraclavicular, and axillary nodes normal.  Neurologic: CN 2 -12 Normal, Normal symmetric reflexes. Normal coordination and gait  Psych: Alert & Oriented x 3, Mood appear stable.    Assessment:    Annual wellness medicare exam   Plan:    During the course of the visit the patient was educated and counseled about appropriate screening and preventive services including:   Annual flu vaccine  Prevnar 13 given     Patient Instructions (the written plan) was given to the patient.  Medicare Attestation  I have personally reviewed:  The patient's medical and social history  Their use of alcohol, tobacco or illicit drugs  Their current medications and supplements  The patient's functional ability including ADLs,fall risks, home safety risks, cognitive, and hearing and visual impairment  Diet and physical activities  Evidence for depression or mood disorders  The patient's weight, height, BMI, and visual acuity have been recorded in the chart. I have made referrals, counseling, and provided education to the patient based on review of the above and I have provided the patient with a written personalized  care plan for preventive services.

## 2017-07-20 NOTE — Patient Instructions (Signed)
Follow-up with cardiology in the near future. Rheumatology appointment will be arranged. Please take blood pressure medications 2 hours before coming to office in return in 2 weeks. Prevnar 13 given today.

## 2017-07-21 LAB — CBC WITH DIFFERENTIAL/PLATELET
Basophils Absolute: 82 cells/uL (ref 0–200)
Basophils Relative: 0.9 %
EOS PCT: 5 %
Eosinophils Absolute: 455 cells/uL (ref 15–500)
HEMATOCRIT: 39.1 % (ref 35.0–45.0)
HEMOGLOBIN: 12.6 g/dL (ref 11.7–15.5)
LYMPHS ABS: 3640 {cells}/uL (ref 850–3900)
MCH: 26.8 pg — ABNORMAL LOW (ref 27.0–33.0)
MCHC: 32.2 g/dL (ref 32.0–36.0)
MCV: 83.2 fL (ref 80.0–100.0)
MPV: 11.6 fL (ref 7.5–12.5)
Monocytes Relative: 9.3 %
NEUTROS ABS: 4077 {cells}/uL (ref 1500–7800)
NEUTROS PCT: 44.8 %
Platelets: 275 10*3/uL (ref 140–400)
RBC: 4.7 10*6/uL (ref 3.80–5.10)
RDW: 13.1 % (ref 11.0–15.0)
Total Lymphocyte: 40 %
WBC: 9.1 10*3/uL (ref 3.8–10.8)
WBCMIX: 846 {cells}/uL (ref 200–950)

## 2017-07-21 LAB — COMPLETE METABOLIC PANEL WITH GFR
AG RATIO: 1.4 (calc) (ref 1.0–2.5)
ALBUMIN MSPROF: 3.8 g/dL (ref 3.6–5.1)
ALT: 11 U/L (ref 6–29)
AST: 13 U/L (ref 10–35)
Alkaline phosphatase (APISO): 72 U/L (ref 33–130)
BUN: 18 mg/dL (ref 7–25)
CALCIUM: 9.6 mg/dL (ref 8.6–10.4)
CO2: 29 mmol/L (ref 20–32)
CREATININE: 0.99 mg/dL (ref 0.50–0.99)
Chloride: 104 mmol/L (ref 98–110)
GFR, EST AFRICAN AMERICAN: 69 mL/min/{1.73_m2} (ref >=60)
GFR, EST NON AFRICAN AMERICAN: 60 mL/min/{1.73_m2} (ref >=60)
GLOBULIN: 2.8 g/dL (ref 1.9–3.7)
Glucose, Bld: 110 mg/dL — ABNORMAL HIGH (ref 65–99)
POTASSIUM: 4.3 mmol/L (ref 3.5–5.3)
SODIUM: 140 mmol/L (ref 135–146)
TOTAL PROTEIN: 6.6 g/dL (ref 6.1–8.1)
Total Bilirubin: 0.3 mg/dL (ref 0.2–1.2)

## 2017-07-21 LAB — LIPID PANEL
CHOL/HDL RATIO: 4.1 (calc) (ref <5.0)
Cholesterol: 178 mg/dL (ref <200)
HDL: 43 mg/dL — ABNORMAL LOW (ref >50)
LDL Cholesterol (Calc): 107 mg/dL (calc) — ABNORMAL HIGH
NON-HDL CHOLESTEROL (CALC): 135 mg/dL — AB (ref <130)
Triglycerides: 167 mg/dL — ABNORMAL HIGH (ref <150)

## 2017-07-21 LAB — MICROALBUMIN / CREATININE URINE RATIO
CREATININE, URINE: 287 mg/dL (ref 20–320)
MICROALB UR: 1.4 mg/dL
Microalb Creat Ratio: 5 mcg/mg creat (ref <30)

## 2017-07-21 LAB — HEMOGLOBIN A1C
EAG (MMOL/L): 7.1 (calc)
HEMOGLOBIN A1C: 6.1 %{Hb} — AB (ref <5.7)
MEAN PLASMA GLUCOSE: 128 (calc)

## 2017-07-21 LAB — TSH: TSH: 2.37 m[IU]/L (ref 0.40–4.50)

## 2017-07-21 LAB — VITAMIN D 25 HYDROXY (VIT D DEFICIENCY, FRACTURES): Vit D, 25-Hydroxy: 35 ng/mL (ref 30–100)

## 2017-07-22 LAB — ANA, IFA COMPREHENSIVE PANEL
ANA: POSITIVE — AB
ENA SM AB SER-ACNC: NEGATIVE AI
SM/RNP: <1 AI
SSA (Ro) (ENA) Antibody, IgG: <1 AI
SSB (LA) (ENA) ANTIBODY, IGG: NEGATIVE AI
Scleroderma (Scl-70) (ENA) Antibody, IgG: <1 AI
ds DNA Ab: <1 IU/mL

## 2017-07-22 LAB — RHEUMATOID FACTOR: Rhuematoid fact SerPl-aCnc: 19 IU/mL — ABNORMAL HIGH (ref <14)

## 2017-07-22 LAB — ANTI-NUCLEAR AB-TITER (ANA TITER): ANA Titer 1: 1:40 {titer} — ABNORMAL HIGH

## 2017-07-22 LAB — CYCLIC CITRUL PEPTIDE ANTIBODY, IGG: Cyclic Citrullin Peptide Ab: <16 UNITS

## 2017-07-22 LAB — SEDIMENTATION RATE: SED RATE: 11 mm/h (ref 0–30)

## 2017-07-28 DIAGNOSIS — L821 Other seborrheic keratosis: Secondary | ICD-10-CM | POA: Diagnosis not present

## 2017-07-28 DIAGNOSIS — B370 Candidal stomatitis: Secondary | ICD-10-CM | POA: Diagnosis not present

## 2017-07-28 DIAGNOSIS — L439 Lichen planus, unspecified: Secondary | ICD-10-CM | POA: Diagnosis not present

## 2017-07-28 DIAGNOSIS — L820 Inflamed seborrheic keratosis: Secondary | ICD-10-CM | POA: Diagnosis not present

## 2017-07-28 DIAGNOSIS — L570 Actinic keratosis: Secondary | ICD-10-CM | POA: Diagnosis not present

## 2017-07-28 DIAGNOSIS — L438 Other lichen planus: Secondary | ICD-10-CM | POA: Diagnosis not present

## 2017-07-31 ENCOUNTER — Ambulatory Visit (HOSPITAL_COMMUNITY): Payer: Medicare Other | Attending: Cardiology

## 2017-07-31 ENCOUNTER — Other Ambulatory Visit: Payer: Self-pay

## 2017-07-31 DIAGNOSIS — E119 Type 2 diabetes mellitus without complications: Secondary | ICD-10-CM | POA: Insufficient documentation

## 2017-07-31 DIAGNOSIS — E669 Obesity, unspecified: Secondary | ICD-10-CM | POA: Insufficient documentation

## 2017-07-31 DIAGNOSIS — E785 Hyperlipidemia, unspecified: Secondary | ICD-10-CM | POA: Insufficient documentation

## 2017-07-31 DIAGNOSIS — I2699 Other pulmonary embolism without acute cor pulmonale: Secondary | ICD-10-CM | POA: Diagnosis not present

## 2017-07-31 DIAGNOSIS — R0600 Dyspnea, unspecified: Secondary | ICD-10-CM | POA: Insufficient documentation

## 2017-07-31 DIAGNOSIS — I10 Essential (primary) hypertension: Secondary | ICD-10-CM | POA: Insufficient documentation

## 2017-07-31 DIAGNOSIS — I081 Rheumatic disorders of both mitral and tricuspid valves: Secondary | ICD-10-CM | POA: Insufficient documentation

## 2017-07-31 DIAGNOSIS — Z6841 Body Mass Index (BMI) 40.0 and over, adult: Secondary | ICD-10-CM | POA: Insufficient documentation

## 2017-08-03 ENCOUNTER — Ambulatory Visit (INDEPENDENT_AMBULATORY_CARE_PROVIDER_SITE_OTHER): Payer: Medicare Other | Admitting: Internal Medicine

## 2017-08-03 ENCOUNTER — Encounter: Payer: Self-pay | Admitting: Internal Medicine

## 2017-08-03 VITALS — BP 134/90 | HR 59 | Temp 98.8°F | Wt 294.0 lb

## 2017-08-03 DIAGNOSIS — Z7901 Long term (current) use of anticoagulants: Secondary | ICD-10-CM

## 2017-08-03 DIAGNOSIS — I2699 Other pulmonary embolism without acute cor pulmonale: Secondary | ICD-10-CM | POA: Diagnosis not present

## 2017-08-03 DIAGNOSIS — I10 Essential (primary) hypertension: Secondary | ICD-10-CM | POA: Diagnosis not present

## 2017-08-03 MED ORDER — LOSARTAN POTASSIUM 100 MG PO TABS: 100.0000 mg | ORAL_TABLET | Freq: Every day | ORAL | 3 refills | Status: DC

## 2017-08-03 NOTE — Patient Instructions (Addendum)
Handicap parking permit signed. Increase losartan 100 mg daily and return in 3 weeks for nurse visit, blood pressure check and basic metabolic panel on increased dose of losartan. Call for appointment to follow-up with Dr. Phillip Heal for to know regarding anticoagulation in October.

## 2017-08-03 NOTE — Progress Notes (Signed)
   Subjective:    Patient ID: [name], female    DOB: Jul 28, 1951, 66 y.o.   MRN: [id number]  HPI 66 year old Female for follow up on elevated BP. Was 140/84 at last visit.  Should see Dr. [name] in October to determine when to stop Xarelto.Told patient she would need to be on it a minimum of 6 months. He will decide when to stop it.  She needs handicap Parking permit signed.  She saw dermatologist recently and was told she had some precancerous lesions on her right face that will be watched.  She has appointment to see Rheumatologist in a few weeks.    Review of Systems     Objective:   Physical Exam Blood pressure is 1:30 for overnight knee today left arm large cuff. Repeated was 140/90.       Assessment & Plan:  Elevated blood pressure  Essential hypertension  Plan: Losartan to 100 mg daily and follow-up in 3 weeks with  blood pressure check and basic metabolic panel. Handicap parking permit signed. Follow-up with Dr. [name] Patient is to call for an appointment for October.

## 2017-08-25 ENCOUNTER — Ambulatory Visit: Payer: Medicare Other | Admitting: Internal Medicine

## 2017-08-29 ENCOUNTER — Ambulatory Visit (INDEPENDENT_AMBULATORY_CARE_PROVIDER_SITE_OTHER): Payer: Medicare Other | Admitting: Internal Medicine

## 2017-08-29 VITALS — BP 148/100 | HR 71 | Temp 98.5°F | Wt 294.0 lb

## 2017-08-29 DIAGNOSIS — I10 Essential (primary) hypertension: Secondary | ICD-10-CM | POA: Diagnosis not present

## 2017-08-29 MED ORDER — OLMESARTAN MEDOXOMIL 40 MG PO TABS: 40.0000 mg | ORAL_TABLET | Freq: Every day | ORAL | 0 refills | Status: DC

## 2017-08-29 NOTE — Progress Notes (Signed)
   Subjective:    Patient ID: [name], female    DOB: 03/01/1951, 66 y.o.   MRN: [id number]  HPI Here today to follow-up on essential hypertension.  Blood pressure remains elevated at 148/100.  At last visit on September 20, blood pressure was 134/90.  Prior to that it was 140/84 on September 6.  On September 20 increase losartan to 100 mg daily.    Review of Systems     Objective:   Physical Exam Chest clear.  Cardiac exam regular rate and rhythm.  No JVD.       Assessment & Plan:  Essential hypertension not well controlled  Plan: Discontinue losartan and change to Benicar 40 mg daily.  She has follow-up appointment with Dr. Pernell Dupre, her cardiology in the near future.  She will remain on metoprolol 25 mg daily.  She may need a diuretic.  Addendum: Patient saw Dr. Pernell Dupre October 25 and blood pressure was 134/88.  She continues to be morbidly obese at 306 pounds.  Cannot move around very well due to knee arthritis.  She had a 2D echocardiogram same day she saw Dr. [name] showing some mild mitral regurgitation.  Right ventricle was normal.  She had mild to moderately elevated pulmonary artery pressure 40 down from 69 when she was diagnosed with acute pulmonary embolism.  Recommends continuing anticoagulation for at least another 6 months and possibly indefinitely.  He agrees she is at increased risk for recurrent DVT.  He feels that blood pressure control is adequate on the current regimen.  He recommends six-month follow-up with cardiology.  He wants her to increase her mobility and lose weight.  I  agree with all of this.  She had a physical exam in September and will be due for 32-month recheck here in March

## 2017-09-01 DIAGNOSIS — Z23 Encounter for immunization: Secondary | ICD-10-CM | POA: Diagnosis not present

## 2017-09-07 ENCOUNTER — Encounter: Payer: Self-pay | Admitting: Interventional Cardiology

## 2017-09-07 ENCOUNTER — Ambulatory Visit (INDEPENDENT_AMBULATORY_CARE_PROVIDER_SITE_OTHER): Payer: Medicare Other | Admitting: Interventional Cardiology

## 2017-09-07 VITALS — BP 134/88 | HR 64 | Ht 66.0 in | Wt 306.8 lb

## 2017-09-07 DIAGNOSIS — E1100 Type 2 diabetes mellitus with hyperosmolarity without nonketotic hyperglycemic-hyperosmolar coma (NKHHC): Secondary | ICD-10-CM | POA: Diagnosis not present

## 2017-09-07 DIAGNOSIS — I2609 Other pulmonary embolism with acute cor pulmonale: Secondary | ICD-10-CM

## 2017-09-07 DIAGNOSIS — I10 Essential (primary) hypertension: Secondary | ICD-10-CM

## 2017-09-07 NOTE — Progress Notes (Signed)
Cardiology Office Note    Date:  09/07/2017   ID:  ORIAN AMBERG, DOB May 13, 1951, MRN [id number]  PCP:  Elby Showers, MD  Cardiologist: Sinclair Grooms, MD   Chief Complaint  Patient presents with  . Congestive Heart Failure    right heart failure secondary to pulmonary emboli    History of Present Illness:  [name] is a 66 y.o. female with a history of GE reflux, depression, morbid obesity, ulcerative colitis, hypokalemia, DMT2 (diet controlled), HTN, asthma and recently diagnosed PE who presents to clinic for post hospital follow up.  [name] was diagnosed with pulmonary emboli in March 2018. She has significant shortness of breath. No orthopnea or PND. No significant lower extremity swelling. No bleeding complications on anticoagulation therapy. She denies chest pain. PA systolic pressures were estimated to be around 75 mmHg during the acute disease process and the most recent echo demonstrates PA systolic pressure estimated at 40 mmHg. PE emanated from lower extremity DVT. She has significant right lower extremity knee arthritis, prior Baker's cyst, and is relatively immobile due to weight and orthopedic issues.   Past Medical History:  Diagnosis Date  . Anemia    none recently  . Anxiety   . Arthritis   . Asthma   . Complete tear of right rotator cuff 12/05/2014  . Depression   . Diabetes mellitus    not on meds.denies a1c 5.8  . Diverticulitis   . Diverticulosis   . Family history of adverse reaction to anesthesia    mom  nausea and vomiting  . GERD (gastroesophageal reflux disease)   . Hypertension   . Hypothyroidism   . Migraine   . Personal history of kidney stones   . Pneumonia    hx  . Pulmonary embolism ([place])    a. diagnosed in 01/2017 w/ imaging showing multiple right lung PE with right heart strain. Started on Xarelto.   . Shortness of breath dyspnea    exersion  . Thyroid disease    hypo  . Wears glasses     Past Surgical History:   Procedure Laterality Date  . ABDOMINAL HYSTERECTOMY    . CATARACT EXTRACTION, BILATERAL    . CESAREAN SECTION    . COLONOSCOPY     multiple   . ESOPHAGOGASTRODUODENOSCOPY ENDOSCOPY     multiple  . KNEE CARTILAGE SURGERY     Left  . LAPAROSCOPIC PARTIAL COLECTOMY N/A 04/22/2013   Procedure: LAPAROSCOPIC PARTIAL COLECTOMY;  Surgeon: Odis Hollingshead, MD;  Location: [facility];  Service: General;  Laterality: N/A;  . right foot surgery  little toe and next toe   x 3  . SHOULDER ARTHROSCOPY WITH ROTATOR CUFF REPAIR AND SUBACROMIAL DECOMPRESSION Right 12/05/2014   Procedure: RIGHT SHOULDER ARTHROSCOPY,ACROMOPLASTY, ROTATOR CUFF REPAIR;  Surgeon: [name], MD;  Location: [facility];  Service: Orthopedics;  Laterality: Right;    Current Medications: Outpatient Medications Prior to Visit  Medication Sig Dispense Refill  . ALPRAZolam (XANAX) 0.5 MG tablet Take 1 tablet (0.5 mg total) by mouth 2 (two) times daily as needed. 180 tablet 1  . cetirizine (ZYRTEC) 10 MG tablet Take 10 mg by mouth at bedtime.     . Cholecalciferol (VITAMIN D3) 5000 units CAPS Take 5,000 Units by mouth daily.    . clobetasol ointment (TEMOVATE) 8.29 % Apply 1 application topically 2 (two) times daily as needed (skin irritation).    . clotrimazole (MYCELEX) 10 MG troche TAKE 3  TIMES DAILY UNTIL SYMPTOMS IMPROVE. THEN TAKE 1 TABLET DAILY.  5  . EPINEPHRINE 0.3 mg/0.3 mL IJ SOAJ injection INJECT 0.3 MLS INTO THE MUSCLE ONCE. 2 Device PRN  . escitalopram (LEXAPRO) 10 MG tablet Take 1 tablet (10 mg total) by mouth daily. 90 tablet 3  . esomeprazole (NEXIUM) 40 MG capsule TAKE 1 CAPSULE BY MOUTH TWICE DAILY 180 capsule 3  . famotidine (PEPCID) 10 MG tablet Take 10 mg by mouth at bedtime.    . furosemide (LASIX) 40 MG tablet Take 40 mg by mouth daily.    Marland Kitchen levothyroxine (SYNTHROID, LEVOTHROID) 200 MCG tablet TAKE 1 TABLET BY MOUTH DAILY BEFORE BREAKFAST. 90 tablet 3  . metoprolol tartrate (LOPRESSOR) 25 MG  tablet Take 1 tablet (25 mg total) by mouth every morning. 90 tablet 3  . montelukast (SINGULAIR) 10 MG tablet Take 1 tablet (10 mg total) by mouth at bedtime. 90 tablet 1  . olmesartan (BENICAR) 40 MG tablet Take 1 tablet (40 mg total) by mouth daily. 30 tablet 0  . OSCIMIN SR 0.375 MG 12 hr tablet Take 0.357 mg by mouth daily.  3  . rivaroxaban (XARELTO) 20 MG TABS tablet Take 1 tablet (20 mg total) by mouth daily with supper. 90 tablet 3  . traZODone (DESYREL) 150 MG tablet Take 1 tablet (150 mg total) by mouth at bedtime. 90 tablet 3  . VENTOLIN HFA 108 (90 Base) MCG/ACT inhaler INHALE 2 PUFFS INTO THE LUNGS EVERY 6 HOURS AS NEEDED FOR WHEEZING. 18 g 11  . VITAMIN D, ERGOCALCIFEROL, PO Take 5,000 Units by mouth daily.    Marland Kitchen zolpidem (AMBIEN) 10 MG tablet TAKE 1 TABLET BY MOUTH ONCE DAILY AT BEDTIME AS NEEDED 90 tablet 1  . fluconazole (DIFLUCAN) 150 MG tablet TAKE 1 TABLET DAILY FOR 3 DAYS. THEN TAKE 1 TABLET WEEKLY FOR 3 WEEKS.  0   No facility-administered medications prior to visit.      Allergies:   Lisinopril; Bee venom; and Morphine and related   Social History   Social History  . Marital status: Single    Spouse name: N/A  . Number of children: N/A  . Years of education: N/A   Social History Main Topics  . Smoking status: Former Smoker    Packs/day: 0.50    Years: 6.00    Types: Cigarettes    Quit date: 11/14/1982  . Smokeless tobacco: Never Used     Comment: quit smoking 30 years ago  . Alcohol use No  . Drug use: No  . Sexual activity: Not Asked   Other Topics Concern  . None   Social History Narrative  . None     Family History:  The patient's family history includes Breast cancer in her paternal aunt; Diabetes in her father; Heart disease in her mother; Kidney failure in her father.   ROS:   Please see the history of present illness.    Right knee arthritis, decreased mobility, obesity  All other systems reviewed and are negative.   PHYSICAL EXAM:   VS:   BP 134/88 (BP Location: Left Arm)   Pulse 64   Ht 5\' 6"  (1.676 m)   Wt (!) 306 lb 12.8 oz (139.2 kg)   BMI 49.52 kg/m    GEN: Well nourished, well developed, in no acute distress . Morbidly obese. HEENT: normal  Neck: no JVD, carotid bruits, or masses Cardiac: RRR; no murmurs, rubs, or gallops,no edema  Respiratory:  clear to auscultation bilaterally, normal work  of breathing GI: soft, nontender, nondistended, + BS MS: no deformity or atrophy  Skin: warm and dry, no rash Neuro:  Alert and Oriented x 3, Strength and sensation are intact Psych: euthymic mood, full affect  Wt Readings from Last 3 Encounters:  09/07/17 (!) 306 lb 12.8 oz (139.2 kg)  08/29/17 294 lb (133.4 kg)  08/03/17 294 lb (133.4 kg)      Studies/Labs Reviewed:   EKG:  EKG  Not repeated  Recent Labs: 01/19/2017: B Natriuretic Peptide 16.3; Magnesium 1.5 07/20/2017: ALT 11; BUN 18; Creat 0.99; Hemoglobin 12.6; Platelets 275; Potassium 4.3; Sodium 140; TSH 2.37   Lipid Panel    Component Value Date/Time   CHOL 178 07/20/2017 1054   TRIG 167 (H) 07/20/2017 1054   HDL 43 (L) 07/20/2017 1054   CHOLHDL 4.1 07/20/2017 1054   VLDL 39 (H) 05/25/2016 1213   [place] 110 05/25/2016 1213    Additional studies/ records that were reviewed today include:  2-D Doppler echocardiogram 07/2017: ------------------------------------------------------------------- Study Conclusions  - Left ventricle: The cavity size was normal. There was mild   concentric hypertrophy. Systolic function was normal. The   estimated ejection fraction was in the range of 60% to 65%. Wall   motion was normal; there were no regional wall motion   abnormalities. Doppler parameters are consistent with abnormal   left ventricular relaxation (grade 1 diastolic dysfunction). - Aortic valve: Trileaflet; normal thickness leaflets. There was no   regurgitation. Valve area (VTI): 2.25 cm^2. Valve area (Vmax):   1.83 cm^2. Valve area (Vmean): 1.93  cm^2. - Aortic root: The aortic root was normal in size. - Mitral valve: There was mild regurgitation. - Left atrium: The atrium was mildly dilated. - Right ventricle: The cavity size was normal. Wall thickness was   normal. Systolic function was normal. - Right atrium: The atrium was normal in size. - Tricuspid valve: There was mild regurgitation. - Pulmonary arteries: Systolic pressure was mildly to moderately   increased. PA peak pressure: 40 mm Hg (S)., down from 69 mmHg during the acute PE face. - Inferior vena cava: The vessel was normal in size. The   respirophasic diameter changes were in the normal range (= 50%),   consistent with normal central venous pressure. - Pericardium, extracardiac: There was no pericardial effusion.   ASSESSMENT:    1. Other acute pulmonary embolism with acute cor pulmonale ([place])   2. Essential hypertension   3. Type 2 diabetes mellitus with hyperosmolarity without coma, without long-term current use of insulin (HCC)      PLAN:  In order of problems listed above:  1. Continue Xarelto therapy for least another 6 months and possibly indefinitely. She is at increased risk for recurrent DVT due to obesity, immobility, and mechanical knee problems that were at fault for the initial episode that caused saddle embolus. 2. Blood pressure control is adequate today on the current medical regimen.  Overall encourage weight loss, increased mobility, continue Xarelto, six-month follow-up.    Medication Adjustments/Labs and Tests Ordered: Current medicines are reviewed at length with the patient today.  Concerns regarding medicines are outlined above.  Medication changes, Labs and Tests ordered today are listed in the Patient Instructions below. Patient Instructions  Medication Instructions:  Your physician recommends that you continue on your current medications as directed. Please refer to the Current Medication list given to you  today.  Labwork: None  Testing/Procedures: None  Follow-Up: Your physician wants you to follow-up in: 6 months with  Dr. [name].  You will receive a reminder letter in the mail two months in advance. If you don't receive a letter, please call our office to schedule the follow-up appointment.   Any Other Special Instructions Will Be Listed Below (If Applicable).     If you need a refill on your cardiac medications before your next appointment, please call your pharmacy.      Signed, Sinclair Grooms, MD  09/07/2017 3:14 PM    [facility] [address], [place], [place]  [address] Phone: [phone]; Fax: [phone]

## 2017-09-07 NOTE — Patient Instructions (Signed)

## 2017-09-13 NOTE — Patient Instructions (Signed)
Change losartan to Benicar 40 mg daily follow-up with Dr. [name] soon.

## 2017-09-15 ENCOUNTER — Encounter: Payer: Self-pay | Admitting: Internal Medicine

## 2017-09-25 ENCOUNTER — Telehealth: Payer: Self-pay

## 2017-09-25 MED ORDER — OLMESARTAN MEDOXOMIL 40 MG PO TABS: 40.0000 mg | ORAL_TABLET | Freq: Every day | ORAL | 1 refills | Status: DC

## 2017-09-25 NOTE — Telephone Encounter (Signed)
Received fax from [facility] in regards to a refill on Olmesartan 40 mg for patient. Medication was refilled per Dr. Verlene Mayer request. Sent 6 months

## 2017-09-27 DIAGNOSIS — M2550 Pain in unspecified joint: Secondary | ICD-10-CM | POA: Diagnosis not present

## 2017-09-27 DIAGNOSIS — L439 Lichen planus, unspecified: Secondary | ICD-10-CM | POA: Diagnosis not present

## 2017-09-27 DIAGNOSIS — Z6841 Body Mass Index (BMI) 40.0 and over, adult: Secondary | ICD-10-CM | POA: Diagnosis not present

## 2017-09-27 DIAGNOSIS — R5383 Other fatigue: Secondary | ICD-10-CM | POA: Diagnosis not present

## 2017-09-27 DIAGNOSIS — M7989 Other specified soft tissue disorders: Secondary | ICD-10-CM | POA: Diagnosis not present

## 2017-09-27 DIAGNOSIS — Z840 Family history of diseases of the skin and subcutaneous tissue: Secondary | ICD-10-CM | POA: Diagnosis not present

## 2017-10-02 ENCOUNTER — Telehealth: Payer: Self-pay

## 2017-10-02 MED ORDER — ESCITALOPRAM OXALATE 10 MG PO TABS: 10.0000 mg | ORAL_TABLET | Freq: Every day | ORAL | 3 refills | Status: DC

## 2017-10-02 NOTE — Telephone Encounter (Signed)
Received fax from [facility] in regards to a refill on Lexapro 10 mg for patient. Medication was refilled per Dr. Verlene Mayer request. Sent 1 yr

## 2017-10-03 DIAGNOSIS — I781 Nevus, non-neoplastic: Secondary | ICD-10-CM | POA: Diagnosis not present

## 2017-10-03 DIAGNOSIS — D1801 Hemangioma of skin and subcutaneous tissue: Secondary | ICD-10-CM | POA: Diagnosis not present

## 2017-10-03 DIAGNOSIS — L820 Inflamed seborrheic keratosis: Secondary | ICD-10-CM | POA: Diagnosis not present

## 2017-10-03 DIAGNOSIS — L439 Lichen planus, unspecified: Secondary | ICD-10-CM | POA: Diagnosis not present

## 2017-10-03 DIAGNOSIS — L570 Actinic keratosis: Secondary | ICD-10-CM | POA: Diagnosis not present

## 2017-10-03 DIAGNOSIS — L438 Other lichen planus: Secondary | ICD-10-CM | POA: Diagnosis not present

## 2017-10-03 DIAGNOSIS — L821 Other seborrheic keratosis: Secondary | ICD-10-CM | POA: Diagnosis not present

## 2017-10-09 ENCOUNTER — Other Ambulatory Visit: Payer: Self-pay | Admitting: *Deleted

## 2017-10-09 MED ORDER — RIVAROXABAN 20 MG PO TABS: 20.0000 mg | ORAL_TABLET | Freq: Every day | ORAL | 1 refills | Status: DC

## 2017-11-03 DIAGNOSIS — L820 Inflamed seborrheic keratosis: Secondary | ICD-10-CM | POA: Diagnosis not present

## 2017-11-03 DIAGNOSIS — L570 Actinic keratosis: Secondary | ICD-10-CM | POA: Diagnosis not present

## 2017-11-03 DIAGNOSIS — L438 Other lichen planus: Secondary | ICD-10-CM | POA: Diagnosis not present

## 2017-11-18 ENCOUNTER — Other Ambulatory Visit: Payer: Self-pay | Admitting: Internal Medicine

## 2017-11-19 NOTE — Telephone Encounter (Signed)
Refill each x 6 months

## 2018-01-18 ENCOUNTER — Encounter: Payer: Self-pay | Admitting: Internal Medicine

## 2018-01-19 MED ORDER — MONTELUKAST SODIUM 10 MG PO TABS: 10.0000 mg | ORAL_TABLET | Freq: Every day | ORAL | 3 refills | Status: DC

## 2018-01-19 NOTE — Telephone Encounter (Signed)
Refill Singulair x one year

## 2018-03-16 ENCOUNTER — Other Ambulatory Visit: Payer: Self-pay | Admitting: Internal Medicine

## 2018-03-16 DIAGNOSIS — L820 Inflamed seborrheic keratosis: Secondary | ICD-10-CM | POA: Diagnosis not present

## 2018-03-16 DIAGNOSIS — Z79899 Other long term (current) drug therapy: Secondary | ICD-10-CM | POA: Diagnosis not present

## 2018-03-16 DIAGNOSIS — L438 Other lichen planus: Secondary | ICD-10-CM | POA: Diagnosis not present

## 2018-04-11 ENCOUNTER — Other Ambulatory Visit: Payer: Self-pay | Admitting: Interventional Cardiology

## 2018-04-11 ENCOUNTER — Other Ambulatory Visit: Payer: Self-pay | Admitting: Internal Medicine

## 2018-04-11 NOTE — Telephone Encounter (Signed)
Xarelto 20mg  refill request received; pt is 67 yrs old, wt-139.2kg, Crea-0.99 on 07/30/17, last seen by Dr. [name] on 09/07/17, CrCl-124.31ml/min; will send in refill to requested pharmacy.

## 2018-04-12 NOTE — Telephone Encounter (Addendum)
Xarelto 20mg  refill request received; pt is 67 yrs old, wt-139.2kg, Crea-0.99 on 07/20/17, last seen by Dr. [name] on 09/07/17. CrCl-124.87ml/min; refill was sent on yesterday-04/12/18. Will call the pharmacy to inquire. [facility] spoke with Ludger Nutting and she stated they have the prescription that was sent on yesterday. Will deny this refill as they have confirmed they have it.

## 2018-07-04 ENCOUNTER — Other Ambulatory Visit: Payer: Self-pay | Admitting: Internal Medicine

## 2018-07-04 NOTE — Telephone Encounter (Signed)
CPE due September. No appt in Epic. Please call pt

## 2018-07-05 NOTE — Telephone Encounter (Signed)
UNABLE TO LEAVE MESSAGE

## 2018-08-16 DIAGNOSIS — Z23 Encounter for immunization: Secondary | ICD-10-CM | POA: Diagnosis not present

## 2018-09-13 ENCOUNTER — Ambulatory Visit (INDEPENDENT_AMBULATORY_CARE_PROVIDER_SITE_OTHER): Payer: Medicare Other | Admitting: Internal Medicine

## 2018-09-13 ENCOUNTER — Encounter: Payer: Self-pay | Admitting: Internal Medicine

## 2018-09-13 VITALS — BP 120/80 | HR 69 | Ht 66.0 in | Wt 297.0 lb

## 2018-09-13 DIAGNOSIS — E039 Hypothyroidism, unspecified: Secondary | ICD-10-CM

## 2018-09-13 DIAGNOSIS — E785 Hyperlipidemia, unspecified: Secondary | ICD-10-CM | POA: Diagnosis not present

## 2018-09-13 DIAGNOSIS — M25561 Pain in right knee: Secondary | ICD-10-CM

## 2018-09-13 DIAGNOSIS — Z9049 Acquired absence of other specified parts of digestive tract: Secondary | ICD-10-CM

## 2018-09-13 DIAGNOSIS — L439 Lichen planus, unspecified: Secondary | ICD-10-CM | POA: Diagnosis not present

## 2018-09-13 DIAGNOSIS — E876 Hypokalemia: Secondary | ICD-10-CM | POA: Diagnosis not present

## 2018-09-13 DIAGNOSIS — F3289 Other specified depressive episodes: Secondary | ICD-10-CM

## 2018-09-13 DIAGNOSIS — M25562 Pain in left knee: Secondary | ICD-10-CM

## 2018-09-13 DIAGNOSIS — Z7901 Long term (current) use of anticoagulants: Secondary | ICD-10-CM

## 2018-09-13 DIAGNOSIS — E1100 Type 2 diabetes mellitus with hyperosmolarity without nonketotic hyperglycemic-hyperosmolar coma (NKHHC): Secondary | ICD-10-CM | POA: Diagnosis not present

## 2018-09-13 DIAGNOSIS — Z8719 Personal history of other diseases of the digestive system: Secondary | ICD-10-CM

## 2018-09-13 DIAGNOSIS — R5383 Other fatigue: Secondary | ICD-10-CM | POA: Diagnosis not present

## 2018-09-13 DIAGNOSIS — G8929 Other chronic pain: Secondary | ICD-10-CM | POA: Diagnosis not present

## 2018-09-13 DIAGNOSIS — E2839 Other primary ovarian failure: Secondary | ICD-10-CM | POA: Diagnosis not present

## 2018-09-13 DIAGNOSIS — I10 Essential (primary) hypertension: Secondary | ICD-10-CM

## 2018-09-13 DIAGNOSIS — E538 Deficiency of other specified B group vitamins: Secondary | ICD-10-CM | POA: Diagnosis not present

## 2018-09-13 DIAGNOSIS — J309 Allergic rhinitis, unspecified: Secondary | ICD-10-CM

## 2018-09-13 DIAGNOSIS — M25551 Pain in right hip: Secondary | ICD-10-CM | POA: Diagnosis not present

## 2018-09-13 DIAGNOSIS — Z0000 Encounter for general adult medical examination without abnormal findings: Secondary | ICD-10-CM | POA: Diagnosis not present

## 2018-09-13 DIAGNOSIS — K219 Gastro-esophageal reflux disease without esophagitis: Secondary | ICD-10-CM

## 2018-09-13 DIAGNOSIS — M2550 Pain in unspecified joint: Secondary | ICD-10-CM | POA: Diagnosis not present

## 2018-09-13 DIAGNOSIS — H9201 Otalgia, right ear: Secondary | ICD-10-CM

## 2018-09-13 DIAGNOSIS — Z86711 Personal history of pulmonary embolism: Secondary | ICD-10-CM

## 2018-09-13 DIAGNOSIS — M25552 Pain in left hip: Secondary | ICD-10-CM

## 2018-09-13 LAB — POCT URINALYSIS DIPSTICK
Appearance: NORMAL
BILIRUBIN UA: NEGATIVE
Blood, UA: NEGATIVE
Glucose, UA: NEGATIVE
KETONES UA: NEGATIVE
Leukocytes, UA: NEGATIVE
NITRITE UA: NEGATIVE
ODOR: NORMAL
PROTEIN UA: POSITIVE — AB
Spec Grav, UA: 1.01 (ref 1.010–1.025)
Urobilinogen, UA: 0.2 E.U./dL
pH, UA: 7.5 (ref 5.0–8.0)

## 2018-09-13 NOTE — Progress Notes (Signed)
Subjective:    Patient ID: [name], female    DOB: [birth year], 67 y.o.   MRN: [id number]  HPI 67 year old Female for Medicare wellness, health maintenance exam and evaluation of medical issues.  Has not seen cardiologist recently.  Has not seen Dr. [name] for follow-up on chronic anticoagulation and pulmonary emboli recently.  We will make appointment for her to see Dr. [name].  History of osteoarthritis both knees and needs right knee arthroplasty but does not want to pursue that right now.  Ambulates with a cane when necessary.  Recently noticed of pain in her right ear.  She used a Q-tip and some bloody mucus material was retrieved.  Still is uncomfortable.  She will be referred to Dr. Erik Obey for evaluation.  She is seeing him previously for sinus issues.  History of lichen planus and mouth which has intermittent exacerbations.  Remains on chronic Xarelto therapy after having an acute saddle pulmonary embolism with acute cor pulmonale March 2018.  History of angioedema  History of diverticulitis in the proximal descending colon status post laparoscopic assisted left colectomy June 2014  History of right shoulder arthroscopic surgery, acromioplasty and rotator cuff repair January 2016.  History of GE reflux, depression, insomnia, ulcerative colitis, hypokalemia, migraine headaches, asthma, history of B12 deficiency.  In 2016 level was 33.  History of hypothyroidism.  Additional past medical history: Fractured left radial head 1995, epicondylitis right elbow in the past, strep throat January 2005, right upper lobe pneumonia March 2011, C-section 1977, abdominal hysterectomy with right salpingectomy January 1998, history of bilateral cataract extractions.  Repair of left knee medial meniscus June 2008.  Admitted December 2007 with syncope.  Had herpes zoster July 2007.  Hospitalized with probable viral gastroenteritis April 2011.  Surgery for chronic sinusitis  November 2010.  History of gallstones and hepatic steatosis.  History of iron deficiency anemia that resolved after hysterectomy.  History of noncardiac and nonpulmonary chest pain 2011.  At that time had negative cardiac enzymes, negative EKG, negative CT angiography although her d-dimer was elevated.  Last colonoscopy was in 2014 with 10-year follow-up recommended.  She only had diverticulosis during that study Colonoscopy 2009 showed severe colitis in the left colon.  She previously had panendoscopy and findings were seen in the terminal ileum that were thought to possibly be inflammatory bowel disease or from aspirin or anti-inflammatory medications.  Pathology Donald biopsy June 2009 revealed ulcerative colonic mucosa with granulomas.  History of positive allergy skin test to dust mites and mold in 2009.  FEV1 was shown to improve with inhaled albuterol.  Sometimes she wheezes and needs an inhaler.  Family history: Father deceased with history of stroke, hypertension diabetes and COPD.  Mother deceased with history of congestive heart failure chronic kidney disease.  One sister and one son in good health.  Patient is been seen in this office in September 1996.  Social history: She is divorced.  Does not smoke.  Occasional alcohol consumption.  Long-standing history of morbid obesity.  Weight in 1996 was 307 pounds and is now 297 pounds with a BMI of 47.94      Review of Systems  Constitutional: Positive for fatigue.  HENT: Negative.   Respiratory: Positive for shortness of breath and wheezing.        Wheezing and chest tightness responds to rescue inhaler  Cardiovascular: Negative for chest pain.  Genitourinary: Positive for urgency.  Musculoskeletal:       Bilateral knee pain  Neurological: Negative.   Psychiatric/Behavioral: Negative.        Objective:   Physical Exam  Constitutional: She is oriented to person, place, and time. She appears well-developed and well-nourished.    HENT:  Head: Normocephalic and atraumatic.  Mouth/Throat: Oropharynx is clear and moist.  Right external ear canal is abnormal.  There appears to be a separation of the proximal external ear canal versus a clot in that area.  The TM itself is intact and looks okay.  It is painful.  Left TM is okay.  Eyes: Conjunctivae are normal. Right eye exhibits no discharge. Left eye exhibits no discharge. No scleral icterus.  Neck: No JVD present. No thyromegaly present.  Cardiovascular: Normal rate, regular rhythm and normal heart sounds.  No murmur heard. Breasts normal female  Pulmonary/Chest: Effort normal and breath sounds normal. No stridor. No respiratory distress. She has no wheezes.  Abdominal: Soft. Bowel sounds are normal. She exhibits no distension and no mass. There is no tenderness. There is no guarding.  Musculoskeletal: She exhibits no edema.  Lymphadenopathy:    She has no cervical adenopathy.  Neurological: She is alert and oriented to person, place, and time. She displays normal reflexes. No cranial nerve deficit. Coordination normal.  Skin: Skin is warm and dry.  Psychiatric: She has a normal mood and affect. Her behavior is normal. Judgment and thought content normal.  Vitals reviewed.         Assessment & Plan:  Abnormal right external ear canal-referred to Dr. Erik Obey for evaluation.  Morbid obesity-continue to work on diet.  Not really able to ambulate very well.  Essential hypertension-stable  History of bilateral pulmonary emboli currently on Xarelto.  Needs to see Dr. [name] for advice on chronic follow-up  End-stage osteoarthritis right knee needing arthroplasty but patient has declined  Diabetes mellitus-stable  Hyperlipidemia-stable  Hypothyroidism-stable on thyroid replacement  History of asthma-has inhaler  History of lichen planus  History of non-granulomatous colitis but last colonoscopy only showed diverticulosis  History of  angioedema  GE reflux treated with PPI  Allergic rhinitis  History of vitamin D deficiency  Recurrent low back pain  Dependent edema  Plan: ENT and hematology consultations.  Continue same medications.  Labs drawn and pending with further instructions to follow.  Follow-up in 6 months.  Subjective:   Patient presents for Medicare Annual/Subsequent preventive examination.  Review Past Medical/Family/Social:   Risk Factors  Current exercise habits:  Dietary issues discussed:   Cardiac risk factors:  Depression Screen  (Note: if answer to either of the following is "Yes", a more complete depression screening is indicated)   Over the past two weeks, have you felt down, depressed or hopeless? No  Over the past two weeks, have you felt little interest or pleasure in doing things? No Have you lost interest or pleasure in daily life? No Do you often feel hopeless? No Do you cry easily over simple problems? No   Activities of Daily Living  In your present state of health, do you have any difficulty performing the following activities?:   Driving? No  Managing money? No  Feeding yourself? No  Getting from bed to chair? No  Climbing a flight of stairs? No  Preparing food and eating?: No  Bathing or showering? No  Getting dressed: No  Getting to the toilet? No  Using the toilet:No  Moving around from place to place: No  In the past year have you fallen or had a near fall?:No  Are you sexually active? No  Do you have more than one partner? No   Hearing Difficulties: No  Do you often ask people to speak up or repeat themselves? No  Do you experience ringing or noises in your ears? No  Do you have difficulty understanding soft or whispered voices? No  Do you feel that you have a problem with memory? No Do you often misplace items? No    Home Safety:  Do you have a smoke alarm at your residence? Yes Do you have grab bars in the bathroom? Do you have throw rugs in your  house?   Cognitive Testing  Alert? Yes Normal Appearance?Yes  Oriented to person? Yes Place? Yes  Time? Yes  Recall of three objects? Yes  Can perform simple calculations? Yes  Displays appropriate judgment?Yes  Can read the correct time from a watch face?Yes   List the Names of Other Physician/Practitioners you currently use:  See referral list for the physicians patient is currently seeing.     Review of Systems:   Objective:     General appearance: Appears stated age and mildly obese  Head: Normocephalic, without obvious abnormality, atraumatic  Eyes: conj clear, EOMi PEERLA  Ears: normal TM's and external ear canals both ears  Nose: Nares normal. Septum midline. Mucosa normal. No drainage or sinus tenderness.  Throat: lips, mucosa, and tongue normal; teeth and gums normal  Neck: no adenopathy, no carotid bruit, no JVD, supple, symmetrical, trachea midline and thyroid not enlarged, symmetric, no tenderness/mass/nodules  No CVA tenderness.  Lungs: clear to auscultation bilaterally  Breasts: normal appearance, no masses or tenderness, top of the pacemaker on left upper chest. Incision well-healed. It is tender.  Heart: regular rate and rhythm, S1, S2 normal, no murmur, click, rub or gallop  Abdomen: soft, non-tender; bowel sounds normal; no masses, no organomegaly  Musculoskeletal: ROM normal in all joints, no crepitus, no deformity, Normal muscle strengthen. Back  is symmetric, no curvature. Skin: Skin color, texture, turgor normal. No rashes or lesions  Lymph nodes: Cervical, supraclavicular, and axillary nodes normal.  Neurologic: CN 2 -12 Normal, Normal symmetric reflexes. Normal coordination and gait  Psych: Alert & Oriented x 3, Mood appear stable.    Assessment:    Annual wellness medicare exam   Plan:    During the course of the visit the patient was educated and counseled about appropriate screening and preventive services including:        Patient  Instructions (the written plan) was given to the patient.  Medicare Attestation  I have personally reviewed:  The patient's medical and social history  Their use of alcohol, tobacco or illicit drugs  Their current medications and supplements  The patient's functional ability including ADLs,fall risks, home safety risks, cognitive, and hearing and visual impairment  Diet and physical activities  Evidence for depression or mood disorders  The patient's weight, height, BMI, and visual acuity have been recorded in the chart. I have made referrals, counseling, and provided education to the patient based on review of the above and I have provided the patient with a written personalized care plan for preventive services.

## 2018-09-14 ENCOUNTER — Other Ambulatory Visit: Payer: Self-pay | Admitting: Internal Medicine

## 2018-09-14 LAB — MICROALBUMIN / CREATININE URINE RATIO
Creatinine, Urine: 230 mg/dL (ref 20–275)
MICROALB UR: 1.3 mg/dL
Microalb Creat Ratio: 6 mcg/mg creat (ref <30)

## 2018-09-14 LAB — CBC WITH DIFFERENTIAL/PLATELET
BASOS ABS: 100 {cells}/uL (ref 0–200)
BASOS PCT: 1 %
EOS PCT: 4.4 %
Eosinophils Absolute: 440 cells/uL (ref 15–500)
HEMATOCRIT: 40.3 % (ref 35.0–45.0)
HEMOGLOBIN: 13 g/dL (ref 11.7–15.5)
LYMPHS ABS: 3270 {cells}/uL (ref 850–3900)
MCH: 27.2 pg (ref 27.0–33.0)
MCHC: 32.3 g/dL (ref 32.0–36.0)
MCV: 84.3 fL (ref 80.0–100.0)
MONOS PCT: 7.3 %
MPV: 11.6 fL (ref 7.5–12.5)
NEUTROS ABS: 5460 {cells}/uL (ref 1500–7800)
Neutrophils Relative %: 54.6 %
Platelets: 293 10*3/uL (ref 140–400)
RBC: 4.78 10*6/uL (ref 3.80–5.10)
RDW: 12.8 % (ref 11.0–15.0)
Total Lymphocyte: 32.7 %
WBC mixed population: 730 cells/uL (ref 200–950)
WBC: 10 10*3/uL (ref 3.8–10.8)

## 2018-09-14 LAB — LIPID PANEL
Cholesterol: 159 mg/dL (ref <200)
HDL: 42 mg/dL — AB (ref >50)
LDL Cholesterol (Calc): 97 mg/dL (calc)
NON-HDL CHOLESTEROL (CALC): 117 mg/dL (ref <130)
TRIGLYCERIDES: 108 mg/dL (ref <150)
Total CHOL/HDL Ratio: 3.8 (calc) (ref <5.0)

## 2018-09-14 LAB — COMPLETE METABOLIC PANEL WITH GFR
AG Ratio: 1.2 (calc) (ref 1.0–2.5)
ALT: 10 U/L (ref 6–29)
AST: 14 U/L (ref 10–35)
Albumin: 3.8 g/dL (ref 3.6–5.1)
Alkaline phosphatase (APISO): 67 U/L (ref 33–130)
BILIRUBIN TOTAL: 0.3 mg/dL (ref 0.2–1.2)
BUN/Creatinine Ratio: 14 (calc) (ref 6–22)
BUN: 15 mg/dL (ref 7–25)
CALCIUM: 9.3 mg/dL (ref 8.6–10.4)
CHLORIDE: 102 mmol/L (ref 98–110)
CO2: 30 mmol/L (ref 20–32)
Creat: 1.04 mg/dL — ABNORMAL HIGH (ref 0.50–0.99)
GFR, EST AFRICAN AMERICAN: 65 mL/min/{1.73_m2} (ref >=60)
GFR, Est Non African American: 56 mL/min/{1.73_m2} — ABNORMAL LOW (ref >=60)
Globulin: 3.3 g/dL (calc) (ref 1.9–3.7)
Glucose, Bld: 119 mg/dL — ABNORMAL HIGH (ref 65–99)
POTASSIUM: 4.2 mmol/L (ref 3.5–5.3)
Sodium: 141 mmol/L (ref 135–146)
TOTAL PROTEIN: 7.1 g/dL (ref 6.1–8.1)

## 2018-09-14 LAB — TSH: TSH: 1.18 mIU/L (ref 0.40–4.50)

## 2018-09-14 LAB — HEMOGLOBIN A1C
EAG (MMOL/L): 7.3 (calc)
Hgb A1c MFr Bld: 6.2 % of total Hgb — ABNORMAL HIGH (ref <5.7)
Mean Plasma Glucose: 131 (calc)

## 2018-09-14 LAB — VITAMIN B12: Vitamin B-12: 415 pg/mL (ref 200–1100)

## 2018-09-14 NOTE — Telephone Encounter (Signed)
Refill x one year °

## 2018-09-18 DIAGNOSIS — L438 Other lichen planus: Secondary | ICD-10-CM | POA: Diagnosis not present

## 2018-09-18 DIAGNOSIS — L821 Other seborrheic keratosis: Secondary | ICD-10-CM | POA: Diagnosis not present

## 2018-09-18 DIAGNOSIS — L820 Inflamed seborrheic keratosis: Secondary | ICD-10-CM | POA: Diagnosis not present

## 2018-09-18 DIAGNOSIS — Z79899 Other long term (current) drug therapy: Secondary | ICD-10-CM | POA: Diagnosis not present

## 2018-10-01 ENCOUNTER — Other Ambulatory Visit: Payer: Self-pay | Admitting: Internal Medicine

## 2018-10-04 ENCOUNTER — Other Ambulatory Visit: Payer: Self-pay | Admitting: Internal Medicine

## 2018-10-15 ENCOUNTER — Ambulatory Visit (INDEPENDENT_AMBULATORY_CARE_PROVIDER_SITE_OTHER): Payer: Medicare Other | Admitting: Oncology

## 2018-10-15 ENCOUNTER — Other Ambulatory Visit: Payer: Self-pay

## 2018-10-15 ENCOUNTER — Encounter: Payer: Self-pay | Admitting: Oncology

## 2018-10-15 VITALS — BP 155/78 | HR 62 | Temp 97.8°F | Wt 310.8 lb

## 2018-10-15 DIAGNOSIS — Z7901 Long term (current) use of anticoagulants: Secondary | ICD-10-CM | POA: Diagnosis not present

## 2018-10-15 DIAGNOSIS — I2602 Saddle embolus of pulmonary artery with acute cor pulmonale: Secondary | ICD-10-CM

## 2018-10-15 DIAGNOSIS — Z86711 Personal history of pulmonary embolism: Secondary | ICD-10-CM | POA: Diagnosis not present

## 2018-10-15 DIAGNOSIS — Z87891 Personal history of nicotine dependence: Secondary | ICD-10-CM

## 2018-10-15 DIAGNOSIS — Z885 Allergy status to narcotic agent status: Secondary | ICD-10-CM

## 2018-10-15 DIAGNOSIS — Z91030 Bee allergy status: Secondary | ICD-10-CM | POA: Diagnosis not present

## 2018-10-15 DIAGNOSIS — I272 Pulmonary hypertension, unspecified: Secondary | ICD-10-CM | POA: Diagnosis not present

## 2018-10-15 DIAGNOSIS — M170 Bilateral primary osteoarthritis of knee: Secondary | ICD-10-CM

## 2018-10-15 DIAGNOSIS — Z888 Allergy status to other drugs, medicaments and biological substances status: Secondary | ICD-10-CM | POA: Diagnosis not present

## 2018-10-15 NOTE — Patient Instructions (Signed)
Continue Xarelto 20 mg daily Return visit with Hematology as needed - can be arranged by Dr Renold Genta

## 2018-10-15 NOTE — Progress Notes (Addendum)
Hematology and Oncology Follow Up Visit  [name] [id number] [birth year] 67 y.o. 10/15/2018 1:21 PM   Principle Diagnosis: Encounter Diagnosis  Name Primary?  . Acute saddle pulmonary embolism with acute cor pulmonale (HCC) Yes  Clinical summary: Obese 67 year old woman I initially evaluated in April 2018.  Echocardiogram done to further evaluate a 61-month history of progressive dyspnea showed evidence of pulmonary hypertension and mild dilation of the pulmonary artery pressure.  A Myoview stress test January 19, 2017 showed normal left heart function but suggested abnormal right heart strain.  CT angiogram the same date showed multiple pulmonary emboli in the right lung.  Right heart strain with RV/LV ratio of 1.  She was admitted to the hospital and anticoagulated.  Lower extremity Dopplers did not show any clot in the leg veins but her history suggested that she may have had a clot in the right calf.  She noticed pain and swelling in the right popliteal fossa which antedated her respiratory symptoms. She had no other obvious risk factors for thrombosis.  No family history of clotting.  Please see my office consultation for additional details. I felt that at least 6 to 12 months of full dose anticoagulation was indicated.  I did mention in my summary note that I would also consider excluding obesity sleep apnea as a potential risk factor.   Interim History:    Due to a secretarial error, she missed a 74-month interval follow-up visit with me.  Overall she is doing well.  She remains on full dose Xarelto anticoagulation.  She gets an occasional minor self-limited nosebleed.  She states that she blows her nose a lot because she has sinus problems.  She denied any hematuria, hematochezia, or vaginal bleeding. She denies any dyspnea, chest pain, chest pressure, or palpitations. She continues have problems with arthritis in her knees.  She has not had any swelling or pain of her  calves.  Medications: reviewed  Allergies:  Allergies  Allergen Reactions  . Lisinopril Anaphylaxis and Other (See Comments)    Possibly Lisinopril or Levaquin, pt was taking both at the time of reaction  . Bee Venom Swelling  . Morphine And Related Itching and Other (See Comments)    Needs benadryl prior to     Review of Systems: See interim history Remaining ROS negative:   Physical Exam: Blood pressure (!) 155/78, pulse 62, temperature 97.8 F (36.6 C), temperature source Oral, weight (!) 310 lb 12.8 oz (141 kg), SpO2 94 %. Wt Readings from Last 3 Encounters:  10/15/18 (!) 310 lb 12.8 oz (141 kg)  09/13/18 297 lb (134.7 kg)  09/07/17 (!) 306 lb 12.8 oz (139.2 kg)     General appearance: Obese Caucasian woman HENNT: Pharynx no erythema, exudate, mass, or ulcer. No thyromegaly or thyroid nodules Lymph nodes: No cervical, supraclavicular, or axillary lymphadenopathy Breasts:  Lungs: Clear to auscultation, resonant to percussion throughout Heart: Regular rhythm, no murmur, no gallop, no rub, no click, no edema Abdomen: Soft, nontender, normal bowel sounds, no mass, no organomegaly Extremities: No edema, no calf tenderness Calf measurements: Right 57 cm, left 55 cm Ankle measurements: Right 28.5 cm, left 28.5 cm Musculoskeletal: no joint deformities GU:  Vascular: Carotid pulses 2+, no bruits, Neurologic: Alert, oriented, PERRLA, , cranial nerves grossly normal, motor strength 5 over 5, reflexes 1+ symmetric at the biceps, absent symmetric at the knees., upper body coordination normal, gait normal, Skin: No rash or ecchymosis  Lab Results: CBC W/Diff    Component  Value Date/Time   WBC 10.0 09/13/2018 1201   RBC 4.78 09/13/2018 1201   HGB 13.0 09/13/2018 1201   HGB 13.6 02/27/2017 1445   HGB 12.9 09/05/2006 1321   HCT 40.3 09/13/2018 1201   HCT 41.2 02/27/2017 1445   HCT 38.2 09/05/2006 1321   PLT 293 09/13/2018 1201   PLT 341 02/27/2017 1445   MCV 84.3  09/13/2018 1201   MCV 81 02/27/2017 1445   MCV 85.1 09/05/2006 1321   MCH 27.2 09/13/2018 1201   MCHC 32.3 09/13/2018 1201   RDW 12.8 09/13/2018 1201   RDW 15.0 02/27/2017 1445   RDW 13.6 09/05/2006 1321   LYMPHSABS 3,270 09/13/2018 1201   LYMPHSABS 3.7 (H) 02/27/2017 1445   LYMPHSABS 3.1 09/05/2006 1321   MONOABS 0.9 01/19/2017 1948   MONOABS 0.7 09/05/2006 1321   EOSABS 440 09/13/2018 1201   EOSABS 0.5 (H) 02/27/2017 1445   BASOSABS 100 09/13/2018 1201   BASOSABS 0.1 02/27/2017 1445   BASOSABS 0.0 09/05/2006 1321     Chemistry      Component Value Date/Time   NA 141 09/13/2018 1201   K 4.2 09/13/2018 1201   CL 102 09/13/2018 1201   CO2 30 09/13/2018 1201   BUN 15 09/13/2018 1201   CREATININE 1.04 (H) 09/13/2018 1201      Component Value Date/Time   CALCIUM 9.3 09/13/2018 1201   ALKPHOS 68 01/19/2017 1948   AST 14 09/13/2018 1201   ALT 10 09/13/2018 1201   BILITOT 0.3 09/13/2018 1201       Radiological Studies: No results found.  Impression: Status post large-volume right-sided pulmonary emboli in March 2018.  We had a long discussion today about the implications of provoked versus unprovoked emboli.  Although there is a suspicion that her pulmonary emboli may have originated in a calf vein, it was still an unprovoked event.  We know for sure that unprovoked thrombotic events are likely to recur at a high frequency when anticoagulation is discontinued.  In addition, she has fixed risk factors which are not going to change over time with respect to her weight, age, and sedentary lifestyle. The clinical trials that looked at dose reduction in people at low to moderate risk for recurrence excluded people at high risk.  In addition, none of the trials looking at the new oral anticoagulants had a significant percentage of patients at extremes of weight either low or high.  She weighs almost 311 pounds.  I would be reluctant to decrease her Xarelto dose.  In my opinion, she  should remain on long-term full dose anticoagulation. She was okay with this.  She has a sister who is on Eliquis for atrial fibrillation.  We discussed that there was really no advantage to her changing to Eliquis in view of her normal renal function.   CC: Patient Care Team: Elby Showers, MD as PCP - General (Internal Medicine) Jackolyn Confer, MD as Consulting Physician (General Surgery) Gatha Mayer, MD as Consulting Physician (Gastroenterology)   Murriel Hopper, MD, [facility]  Hematology-Oncology/Internal Medicine     12/2/20191:21 PM

## 2018-10-22 ENCOUNTER — Encounter: Payer: Self-pay | Admitting: Pulmonary Disease

## 2018-10-22 ENCOUNTER — Ambulatory Visit (INDEPENDENT_AMBULATORY_CARE_PROVIDER_SITE_OTHER): Payer: Medicare Other | Admitting: Pulmonary Disease

## 2018-10-22 VITALS — BP 118/82 | HR 61 | Ht 65.0 in | Wt 309.8 lb

## 2018-10-22 DIAGNOSIS — R0683 Snoring: Secondary | ICD-10-CM

## 2018-10-22 NOTE — Progress Notes (Signed)
[facility]  Chief Complaint  Patient presents with  . Consult    States she has a HX of PE. She was recently told that sleep apnea can cause PE.    Constitutional:  BP 118/82   Pulse 61   Ht 5\' 5"  (1.651 m)   Wt (!) 309 lb 12.8 oz (140.5 kg)   SpO2 93%   BMI 51.55 kg/m   Past Medical History:  Anemia, Anxiety, Asthma, Depression, DM, Diverticulosis, GERD, HTN, Hypothyroidism, Migraine HA, Nephrolithiasis, PNA, PE March 2018  Brief Summary:  [name] is a 67 y.o. female with snoring.  She had a pulmonary embolism and DVT in March of 2018.  She has trouble with her sleep and snores.  She also has hypertension and is on several medications for this.  There was concern she could have sleep apnea.  She has trouble sleeping on her back.  Her mouth gets dry at times while asleep.  She does take naps during the day occasionally.  She goes to sleep at 1 am.  She falls asleep within 30 minutes.  She wakes up 2 to 3 times to use the bathroom.  She gets out of bed at 8 am.  She feels tired in the morning.  She occasionally gets morning headache.  She does not use anything to help her stay awake.  She has been using ambien and trazodone at night to help her sleep.  She takes these about 1 hour before going to bed.  She has been using these for a couple of years.  She denies sleep walking, sleep talking, bruxism, or nightmares.  There is no history of restless legs.  She denies sleep hallucinations, sleep paralysis, or cataplexy.  The Epworth score is 4 out of 24.   Physical Exam:   Appearance - well kempt   ENMT - clear nasal mucosa, midline nasal  septum, no oral exudates, no LAN, trachea midline, MP 4, enlarged tongue, high arched palate  Respiratory - normal chest wall, normal respiratory effort, no accessory muscle use, no wheeze/rales  CV - s1s2 regular rate and rhythm, no murmurs, no peripheral edema, radial pulses symmetric  GI  - soft, non tender, no masses  Lymph - no adenopathy noted in neck and axillary areas  MSK - normal gait  Ext - no cyanosis, clubbing, or joint inflammation noted  Skin - no rashes, lesions, or ulcers  Neuro - normal strength, oriented x 3  Psych - normal mood and affect  Discussion:  She has snoring, sleep disruption, and daytime time sleepiness. She has history of depression, hypertension, insomnia, and pulmonary embolism.  I am concerned she could have sleep apnea.  Assessment/Plan:   Snoring with excessive daytime sleepiness. - will need to arrange for a home sleep study  Obesity. - discussed how weight can impact sleep and risk for sleep disordered breathing - discussed options to assist with weight loss: combination of diet modification, cardiovascular and strength training exercises  Cardiovascular risk. - had an extensive discussion regarding the adverse health consequences related to untreated sleep disordered breathing - specifically discussed the risks for hypertension, coronary artery disease, cardiac dysrhythmias, cerebrovascular disease, and diabetes - lifestyle modification discussed  Safe driving practices. - discussed how sleep disruption can increase risk of accidents, particularly when driving - safe driving practices were discussed  Therapies for obstructive sleep apnea. - if the sleep study shows significant sleep apnea, then various therapies for treatment were reviewed:  CPAP, oral appliance, and surgical interventions    Patient Instructions  Will arrange for home sleep study Will call to arrange for follow up after sleep study reviewed     Chesley Mires, MD [place] Pager: [phone] 10/22/2018, 11:39 AM  Flow Sheet     Pulmonary tests:  CT angio chest 01/19/17 >> multiple PE with RV:LV 1  Sleep tests:    Cardiac tests:  Echo 07/31/17 >> EF 60 to 65%, grade 1 DD, mild MR, PAS 40 mmHg   Review of Systems:    Cough, palpitations, cough, sneezing, joint pain.  Otherwise negative  Medications:   Allergies as of 10/22/2018      Reactions   Lisinopril Anaphylaxis, Other (See Comments)   Possibly Lisinopril or Levaquin, pt was taking both at the time of reaction   Bee Venom Swelling   Morphine And Related Itching, Other (See Comments)   Needs benadryl prior to       Medication List        Accurate as of 10/22/18 11:39 AM. Always use your most recent med list.          ALPRAZolam 0.5 MG tablet Commonly known as:  XANAX TAKE 1 TABLET BY MOUTH TWICE A DAY AS NEEDED   cetirizine 10 MG tablet Commonly known as:  ZYRTEC Take 10 mg by mouth at bedtime.   clobetasol ointment 0.05 % Commonly known as:  TEMOVATE Apply 1 application topically 2 (two) times daily as needed (skin irritation).   clotrimazole 10 MG troche Commonly known as:  MYCELEX TAKE 3 TIMES DAILY UNTIL SYMPTOMS IMPROVE. THEN TAKE 1 TABLET DAILY.   EPINEPHrine 0.3 mg/0.3 mL Soaj injection Commonly known as:  EPI-PEN INJECT 0.3 MLS INTO THE MUSCLE ONCE.   escitalopram 10 MG tablet Commonly known as:  LEXAPRO TAKE 1 TABLET (10 MG TOTAL) DAILY BY MOUTH.   esomeprazole 40 MG capsule Commonly known as:  NEXIUM TAKE 1 CAPSULE BY MOUTH TWICE DAILY   famotidine 10 MG tablet Commonly known as:  PEPCID Take 10 mg by mouth at bedtime.   furosemide 20 MG tablet Commonly known as:  LASIX TAKE 1 TABLET BY MOUTH EVERY DAY   levothyroxine 200 MCG tablet Commonly known as:  SYNTHROID, LEVOTHROID TAKE 1 TABLET BY MOUTH EVERY DAY ON EMPTY STOMACH BEFORE BREAKFAST   metoprolol tartrate 25 MG tablet Commonly known as:  LOPRESSOR TAKE 1 TABLET BY MOUTH EVERY MORNING   montelukast 10 MG tablet Commonly known as:  SINGULAIR Take 1 tablet (10 mg total) by mouth at bedtime.   olmesartan 40 MG tablet Commonly known as:  BENICAR TAKE 1 TABLET BY MOUTH DAILY   OSCIMIN SR 0.375 MG 12 hr tablet Generic drug:  hyoscyamine Take  0.357 mg by mouth daily.   traZODone 150 MG tablet Commonly known as:  DESYREL TAKE 1 TABLET BY MOUTH AT BEDTIME   VENTOLIN HFA 108 (90 Base) MCG/ACT inhaler Generic drug:  albuterol USE 2 PUFFS EVERY 6 HOURS AS NEEDED FOR WHEEZING   VITAMIN D (ERGOCALCIFEROL) PO Take 5,000 Units by mouth daily.   Vitamin D3 125 MCG (5000 UT) Caps Take 5,000 Units by mouth daily.   XARELTO 20 MG Tabs tablet Generic drug:  rivaroxaban TAKE 1 TABLET (20 MG TOTAL) BY MOUTH DAILY WITH SUPPER.   zolpidem 10 MG tablet Commonly known as:  AMBIEN TAKE 1 TABLET BY MOUTH AT BEDTIME AS NEEDED       Past Surgical History:  She  has a  past surgical history that includes Cesarean section; Abdominal hysterectomy; Knee cartilage surgery; Cataract extraction, bilateral; Colonoscopy; Esophagogastroduodenoscopy endoscopy; right foot surgery (little toe and next toe); Laparoscopic partial colectomy (N/A, 04/22/2013); and Shoulder arthroscopy with rotator cuff repair and subacromial decompression (Right, 12/05/2014).  Family History:  Her family history includes Breast cancer in her paternal aunt; Diabetes in her father; Heart disease in her mother; Kidney failure in her father.  Social History:  She  reports that she quit smoking about 35 years ago. Her smoking use included cigarettes. She has a 3.00 pack-year smoking history. She has never used smokeless tobacco. She reports that she does not drink alcohol or use drugs.

## 2018-10-22 NOTE — Patient Instructions (Signed)
Will arrange for home sleep study Will call to arrange for follow up after sleep study reviewed  

## 2018-10-23 DIAGNOSIS — R7303 Prediabetes: Secondary | ICD-10-CM | POA: Diagnosis not present

## 2018-10-23 DIAGNOSIS — H04123 Dry eye syndrome of bilateral lacrimal glands: Secondary | ICD-10-CM | POA: Diagnosis not present

## 2018-10-23 DIAGNOSIS — H43813 Vitreous degeneration, bilateral: Secondary | ICD-10-CM | POA: Diagnosis not present

## 2018-10-23 DIAGNOSIS — Z961 Presence of intraocular lens: Secondary | ICD-10-CM | POA: Diagnosis not present

## 2018-10-23 DIAGNOSIS — H1851 Endothelial corneal dystrophy: Secondary | ICD-10-CM | POA: Diagnosis not present

## 2018-10-23 LAB — HM DIABETES EYE EXAM

## 2018-11-01 ENCOUNTER — Encounter: Payer: Self-pay | Admitting: Internal Medicine

## 2018-11-13 ENCOUNTER — Other Ambulatory Visit: Payer: Self-pay | Admitting: Oncology

## 2018-11-19 DIAGNOSIS — G4733 Obstructive sleep apnea (adult) (pediatric): Secondary | ICD-10-CM | POA: Diagnosis not present

## 2018-11-20 ENCOUNTER — Telehealth: Payer: Self-pay | Admitting: Internal Medicine

## 2018-11-20 ENCOUNTER — Other Ambulatory Visit: Payer: Self-pay | Admitting: *Deleted

## 2018-11-20 DIAGNOSIS — G4733 Obstructive sleep apnea (adult) (pediatric): Secondary | ICD-10-CM | POA: Diagnosis not present

## 2018-11-20 DIAGNOSIS — R0683 Snoring: Secondary | ICD-10-CM

## 2018-11-20 NOTE — Telephone Encounter (Signed)
Patient called stating that she received a bill from [facility] for $758.62 for the labs drawn on 09/13/2018. She asked if we would be able to look into this because based on her EOB nothing was paid for by her insurance company. Patient can be reached at [phone].

## 2018-11-21 ENCOUNTER — Telehealth: Payer: Self-pay | Admitting: Pulmonary Disease

## 2018-11-21 NOTE — Telephone Encounter (Signed)
HST 11/19/18 >> AHI 14.6, SpO2 low 73%.  Spent 270.4 min with SpO2 < 89%.   Please let her know that the sleep study showed mild to moderate obstructive sleep apnea and low oxygen levels at night.  Please arrange for ROV with a nurse practitioner to review treatment options.

## 2018-11-22 NOTE — Telephone Encounter (Signed)
Attempted to call patient today regarding results. I did not receive an answer at time of call. I have left a voicemail message for pt to return call. X1  

## 2018-11-23 NOTE — Telephone Encounter (Signed)
Called and spoke with patient regarding results.  Informed the patient of results and recommendations today. Scheduled appt with EW 11/27/2018 at 3pm Pt verbalized understanding and denied any questions or concerns at this time.  Nothing further needed.

## 2018-11-26 NOTE — Progress Notes (Signed)
@Patient  ID: [name], female    DOB: August 04, 1951, 68 y.o.   MRN: [id number]  Chief Complaint  Patient presents with  . Follow-up    HST    Referring provider: Elby Showers, MD  HPI: 68 year old female, former smoker quit 1984 (3 pack year hx). PMH significant for asthma, acute saddle pulmonary embolism and DVT in March 2018, hypertension. Patient of Dr. Halford Chessman, seen for initial consult on 10/22/18 for snoring. [facility] 4/24. HST 11/19/17 showed an AHI 14.6, SpO2 low 73%.   11/27/2018 Patient presents today to review home sleep test. HST showed mild-moderate obstructive sleep apnea and low oxygen levels at night. Reports sleep disruption, snoring and daytime sleepiness. Takes naps during the day occasionally. Long discussion about OSA treatment options including weight loss, oral appliance, CPAP and surgery. She is somewhat claustrophobic and does not like things touching her face. Showed patient samples of different types of mask, feels she may be able to try [employer] dreamwear full facemask.   Allergies  Allergen Reactions  . Lisinopril Anaphylaxis and Other (See Comments)    Possibly Lisinopril or Levaquin, pt was taking both at the time of reaction  . Bee Venom Swelling  . Morphine And Related Itching and Other (See Comments)    Needs benadryl prior to     Immunization History  Administered Date(s) Administered  . Influenza Split 08/01/2013  . Influenza, High Dose Seasonal PF 09/01/2017, 08/16/2018  . Influenza-Unspecified 08/09/2012, 09/30/2015, 07/02/2016  . Pneumococcal Conjugate-13 10/19/2015, 07/20/2017  . Pneumococcal Polysaccharide-23 05/06/1999  . Zoster 08/09/2012    Past Medical History:  Diagnosis Date  . Anemia    none recently  . Anxiety   . Arthritis   . Asthma   . Complete tear of right rotator cuff 12/05/2014  . Depression   . Diabetes mellitus    not on meds.denies a1c 5.8  . Diverticulitis   . Diverticulosis   . Family history of adverse  reaction to anesthesia    mom  nausea and vomiting  . GERD (gastroesophageal reflux disease)   . Hypertension   . Hypothyroidism   . Migraine   . Personal history of kidney stones   . Pneumonia    hx  . Pulmonary embolism ([place])    a. diagnosed in 01/2017 w/ imaging showing multiple right lung PE with right heart strain. Started on Xarelto.   . Shortness of breath dyspnea    exersion  . Thyroid disease    hypo  . Wears glasses     Tobacco History: Social History   Tobacco Use  Smoking Status Former Smoker  . Packs/day: 0.50  . Years: 6.00  . Pack years: 3.00  . Types: Cigarettes  . Last attempt to quit: 11/14/1982  . Years since quitting: 36.0  Smokeless Tobacco Never Used  Tobacco Comment   quit smoking 30 years ago   Counseling given: Not Answered Comment: quit smoking 30 years ago   Outpatient Medications Prior to Visit  Medication Sig Dispense Refill  . ALPRAZolam (XANAX) 0.5 MG tablet TAKE 1 TABLET BY MOUTH TWICE A DAY AS NEEDED 180 tablet 0  . cetirizine (ZYRTEC) 10 MG tablet Take 10 mg by mouth at bedtime.     . Cholecalciferol (VITAMIN D3) 5000 units CAPS Take 5,000 Units by mouth daily.    . clobetasol ointment (TEMOVATE) 7.16 % Apply 1 application topically 2 (two) times daily as needed (skin irritation).    . clotrimazole (MYCELEX) 10 MG troche  TAKE 3 TIMES DAILY UNTIL SYMPTOMS IMPROVE. THEN TAKE 1 TABLET DAILY.  5  . EPINEPHRINE 0.3 mg/0.3 mL IJ SOAJ injection INJECT 0.3 MLS INTO THE MUSCLE ONCE. 2 Device PRN  . escitalopram (LEXAPRO) 10 MG tablet TAKE 1 TABLET (10 MG TOTAL) DAILY BY MOUTH. 90 tablet 3  . esomeprazole (NEXIUM) 40 MG capsule TAKE 1 CAPSULE BY MOUTH TWICE DAILY 180 capsule 3  . famotidine (PEPCID) 10 MG tablet Take 10 mg by mouth at bedtime.    . furosemide (LASIX) 20 MG tablet TAKE 1 TABLET BY MOUTH EVERY DAY 90 tablet 1  . levothyroxine (SYNTHROID, LEVOTHROID) 200 MCG tablet TAKE 1 TABLET BY MOUTH EVERY DAY ON EMPTY STOMACH BEFORE BREAKFAST  90 tablet 1  . metoprolol tartrate (LOPRESSOR) 25 MG tablet TAKE 1 TABLET BY MOUTH EVERY MORNING 90 tablet 1  . montelukast (SINGULAIR) 10 MG tablet Take 1 tablet (10 mg total) by mouth at bedtime. 90 tablet 3  . olmesartan (BENICAR) 40 MG tablet TAKE 1 TABLET BY MOUTH DAILY 90 tablet 3  . OSCIMIN SR 0.375 MG 12 hr tablet Take 0.357 mg by mouth daily.  3  . traZODone (DESYREL) 150 MG tablet TAKE 1 TABLET BY MOUTH AT BEDTIME 90 tablet 1  . VENTOLIN HFA 108 (90 Base) MCG/ACT inhaler USE 2 PUFFS EVERY 6 HOURS AS NEEDED FOR WHEEZING 18 Inhaler 10  . VITAMIN D, ERGOCALCIFEROL, PO Take 5,000 Units by mouth daily.    Alveda Reasons 20 MG TABS tablet TAKE 1 TABLET (20 MG TOTAL) BY MOUTH DAILY WITH SUPPER. 90 tablet 2  . zolpidem (AMBIEN) 10 MG tablet TAKE 1 TABLET BY MOUTH AT BEDTIME AS NEEDED 90 tablet 0   No facility-administered medications prior to visit.     Review of Systems  Review of Systems  Constitutional: Positive for fatigue.  HENT: Positive for congestion.   Respiratory: Negative for shortness of breath.   Cardiovascular: Negative.   Psychiatric/Behavioral: Positive for sleep disturbance.    Physical Exam  BP 104/72 (BP Location: Left Wrist, Cuff Size: Large)   Pulse 60   Ht 5\' 5"  (1.651 m)   Wt (!) 309 lb 9.6 oz (140.4 kg)   SpO2 95%   BMI 51.52 kg/m  Physical Exam Constitutional:      Appearance: Normal appearance. She is obese.  HENT:     Head: Normocephalic and atraumatic.     Right Ear: Tympanic membrane normal.     Left Ear: Tympanic membrane normal.     Nose: Nose normal.     Mouth/Throat:     Mouth: Mucous membranes are moist.     Pharynx: Oropharynx is clear.     Comments: Mallampati class II Neck:     Musculoskeletal: Normal range of motion and neck supple.  Cardiovascular:     Rate and Rhythm: Normal rate and regular rhythm.  Pulmonary:     Effort: Pulmonary effort is normal. No respiratory distress.     Breath sounds: Normal breath sounds. No wheezing,  rhonchi or rales.  Musculoskeletal: Normal range of motion.     Comments: Amb with cane  Skin:    General: Skin is warm and dry.  Neurological:     General: No focal deficit present.     Mental Status: She is alert and oriented to person, place, and time. Mental status is at baseline.  Psychiatric:        Mood and Affect: Mood normal.        Behavior: Behavior normal.  Thought Content: Thought content normal.        Judgment: Judgment normal.      Lab Results:  CBC    Component Value Date/Time   WBC 10.0 09/13/2018 1201   RBC 4.78 09/13/2018 1201   HGB 13.0 09/13/2018 1201   HGB 13.6 02/27/2017 1445   HGB 12.9 09/05/2006 1321   HCT 40.3 09/13/2018 1201   HCT 41.2 02/27/2017 1445   HCT 38.2 09/05/2006 1321   PLT 293 09/13/2018 1201   PLT 341 02/27/2017 1445   MCV 84.3 09/13/2018 1201   MCV 81 02/27/2017 1445   MCV 85.1 09/05/2006 1321   MCH 27.2 09/13/2018 1201   MCHC 32.3 09/13/2018 1201   RDW 12.8 09/13/2018 1201   RDW 15.0 02/27/2017 1445   RDW 13.6 09/05/2006 1321   LYMPHSABS 3,270 09/13/2018 1201   LYMPHSABS 3.7 (H) 02/27/2017 1445   LYMPHSABS 3.1 09/05/2006 1321   MONOABS 0.9 01/19/2017 1948   MONOABS 0.7 09/05/2006 1321   EOSABS 440 09/13/2018 1201   EOSABS 0.5 (H) 02/27/2017 1445   BASOSABS 100 09/13/2018 1201   BASOSABS 0.1 02/27/2017 1445   BASOSABS 0.0 09/05/2006 1321    BMET    Component Value Date/Time   NA 141 09/13/2018 1201   K 4.2 09/13/2018 1201   CL 102 09/13/2018 1201   CO2 30 09/13/2018 1201   GLUCOSE 119 (H) 09/13/2018 1201   BUN 15 09/13/2018 1201   CREATININE 1.04 (H) 09/13/2018 1201   CALCIUM 9.3 09/13/2018 1201   GFRNONAA 56 (L) 09/13/2018 1201   GFRAA 65 09/13/2018 1201    BNP    Component Value Date/Time   BNP 16.3 01/19/2017 1948   BNP 167.6 (H) 12/27/2016 1241    ProBNP    Component Value Date/Time   PROBNP 67.0 04/12/2008 0500    Imaging: No results found.   Assessment & Plan:   OSA (obstructive  sleep apnea) - HST 11/19/17 showed AHI 14.6, SpO2 low 73% - Discussed options including weight loss, oral appliance, CPAP or surgery - Referral to DME company for new CPAP start, auto titrate 5-15cm H20 with mask of choice  - Advised not to drive if experiencing excessive daytime fatigue or somnolence - Follow up in 31-90 days with Dr. Halford Chessman or NP  - Needs ONO after CPAP initiated     Martyn Ehrich, NP 11/27/2018

## 2018-11-27 ENCOUNTER — Ambulatory Visit (INDEPENDENT_AMBULATORY_CARE_PROVIDER_SITE_OTHER): Payer: Medicare Other | Admitting: Primary Care

## 2018-11-27 ENCOUNTER — Encounter: Payer: Self-pay | Admitting: Primary Care

## 2018-11-27 VITALS — BP 104/72 | HR 60 | Ht 65.0 in | Wt 309.6 lb

## 2018-11-27 DIAGNOSIS — G4733 Obstructive sleep apnea (adult) (pediatric): Secondary | ICD-10-CM | POA: Diagnosis not present

## 2018-11-27 NOTE — Assessment & Plan Note (Signed)
-   HST 11/19/17 showed AHI 14.6, SpO2 low 73% - Discussed options including weight loss, oral appliance, CPAP or surgery - Referral to DME company for new CPAP start, auto titrate 5-15cm H20 with mask of choice  - Advised not to drive if experiencing excessive daytime fatigue or somnolence - Follow up in 31-90 days with Dr. Halford Chessman or NP  - Needs ONO after CPAP initiated

## 2018-11-27 NOTE — Patient Instructions (Signed)
Ambulatory referral for new CPAP start Auto titrate 5-15cm H20, mask of choice, humidification and supplies  Goal wear CPAP device every night for at least 4 hours  Do not drive if experiencing excessive daytime fatigue or somnolence Avoid sedation medication or excessive amounts of alcohol prior to bedtime as these can worsen your OSA  FU in 31-90 days with Dr. Halford Chessman or NP    Sleep Apnea Sleep apnea is a condition in which breathing pauses or becomes shallow during sleep. Episodes of sleep apnea usually last 10 seconds or longer, and they may occur as many as 20 times an hour. Sleep apnea disrupts your sleep and keeps your body from getting the rest that it needs. This condition can increase your risk of certain health problems, including:  Heart attack.  Stroke.  Obesity.  Diabetes.  Heart failure.  Irregular heartbeat. There are three kinds of sleep apnea:  Obstructive sleep apnea. This kind is caused by a blocked or collapsed airway.  Central sleep apnea. This kind happens when the part of the brain that controls breathing does not send the correct signals to the muscles that control breathing.  Mixed sleep apnea. This is a combination of obstructive and central sleep apnea. What are the causes? The most common cause of this condition is a collapsed or blocked airway. An airway can collapse or become blocked if:  Your throat muscles are abnormally relaxed.  Your tongue and tonsils are larger than normal.  You are overweight.  Your airway is smaller than normal. What increases the risk? This condition is more likely to develop in people who:  Are overweight.  Smoke.  Have a smaller than normal airway.  Are elderly.  Are female.  Drink alcohol.  Take sedatives or tranquilizers.  Have a family history of sleep apnea. What are the signs or symptoms? Symptoms of this condition include:  Trouble staying asleep.  Daytime sleepiness and  tiredness.  Irritability.  Loud snoring.  Morning headaches.  Trouble concentrating.  Forgetfulness.  Decreased interest in sex.  Unexplained sleepiness.  Mood swings.  Personality changes.  Feelings of depression.  Waking up often during the night to urinate.  Dry mouth.  Sore throat. How is this diagnosed? This condition may be diagnosed with:  A medical history.  A physical exam.  A series of tests that are done while you are sleeping (sleep study). These tests are usually done in a sleep lab, but they may also be done at home. How is this treated? Treatment for this condition aims to restore normal breathing and to ease symptoms during sleep. It may involve managing health issues that can affect breathing, such as high blood pressure or obesity. Treatment may include:  Sleeping on your side.  Using a decongestant if you have nasal congestion.  Avoiding the use of depressants, including alcohol, sedatives, and narcotics.  Losing weight if you are overweight.  Making changes to your diet.  Quitting smoking.  Using a device to open your airway while you sleep, such as: ? An oral appliance. This is a custom-made mouthpiece that shifts your lower jaw forward. ? A continuous positive airway pressure (CPAP) device. This device delivers oxygen to your airway through a mask. ? A nasal expiratory positive airway pressure (EPAP) device. This device has valves that you put into each nostril. ? A bi-level positive airway pressure (BPAP) device. This device delivers oxygen to your airway through a mask.  Surgery if other treatments do not work. During surgery,  excess tissue is removed to create a wider airway. It is important to get treatment for sleep apnea. Without treatment, this condition can lead to:  High blood pressure.  Coronary artery disease.  (Men) An inability to achieve or maintain an erection (impotence).  Reduced thinking abilities. Follow these  instructions at home:  Make any lifestyle changes that your health care provider recommends.  Eat a healthy, well-balanced diet.  Take over-the-counter and prescription medicines only as told by your health care provider.  Avoid using depressants, including alcohol, sedatives, and narcotics.  Take steps to lose weight if you are overweight.  If you were given a device to open your airway while you sleep, use it only as told by your health care provider.  Do not use any tobacco products, such as cigarettes, chewing tobacco, and e-cigarettes. If you need help quitting, ask your health care provider.  Keep all follow-up visits as told by your health care provider. This is important. Contact a health care provider if:  The device that you received to open your airway during sleep is uncomfortable or does not seem to be working.  Your symptoms do not improve.  Your symptoms get worse. Get help right away if:  You develop chest pain.  You develop shortness of breath.  You develop discomfort in your back, arms, or stomach.  You have trouble speaking.  You have weakness on one side of your body.  You have drooping in your face. These symptoms may represent a serious problem that is an emergency. Do not wait to see if the symptoms will go away. Get medical help right away. Call your local emergency services (911 in the [place] Do not drive yourself to the hospital. This information is not intended to replace advice given to you by your health care provider. Make sure you discuss any questions you have with your health care provider. Document Released: 10/21/2002 Document Revised: 05/29/2017 Document Reviewed: 08/10/2015 Elsevier Interactive Patient Education  2019 [employer].     CPAP and BPAP Information CPAP and BPAP are methods of helping a person breathe with the use of air pressure. CPAP stands for "continuous positive airway pressure." BPAP stands for "bi-level positive  airway pressure." In both methods, air is blown through your nose or mouth and into your air passages to help you breathe well. CPAP and BPAP use different amounts of pressure to blow air. With CPAP, the amount of pressure stays the same while you breathe in and out. With BPAP, the amount of pressure is increased when you breathe in (inhale) so that you can take larger breaths. Your health care provider will recommend whether CPAP or BPAP would be more helpful for you. Why are CPAP and BPAP treatments used? CPAP or BPAP can be helpful if you have:  Sleep apnea.  Chronic obstructive pulmonary disease (COPD).  Heart failure.  Medical conditions that weaken the muscles of the chest including muscular dystrophy, or neurological diseases such as amyotrophic lateral sclerosis (ALS).  Other problems that cause breathing to be weak, abnormal, or difficult. CPAP is most commonly used for obstructive sleep apnea (OSA) to keep the airways from collapsing when the muscles relax during sleep. How is CPAP or BPAP administered? Both CPAP and BPAP are provided by a small machine with a flexible plastic tube that attaches to a plastic mask. You wear the mask. Air is blown through the mask into your nose or mouth. The amount of pressure that is used to blow the  air can be adjusted on the machine. Your health care provider will determine the pressure setting that should be used based on your individual needs. When should CPAP or BPAP be used? In most cases, the mask only needs to be worn during sleep. Generally, the mask needs to be worn throughout the night and during any daytime naps. People with certain medical conditions may also need to wear the mask at other times when they are awake. Follow instructions from your health care provider about when to use the machine. What are some tips for using the mask?   Because the mask needs to be snug, some people feel trapped or closed-in (claustrophobic) when first  using the mask. If you feel this way, you may need to get used to the mask. One way to do this is by holding the mask loosely over your nose or mouth and then gradually applying the mask more snugly. You can also gradually increase the amount of time that you use the mask.  Masks are available in various types and sizes. Some fit over your mouth and nose while others fit over just your nose. If your mask does not fit well, talk with your health care provider about getting a different one.  If you are using a mask that fits over your nose and you tend to breathe through your mouth, a chin strap may be applied to help keep your mouth closed.  The CPAP and BPAP machines have alarms that may sound if the mask comes off or develops a leak.  If you have trouble with the mask, it is very important that you talk with your health care provider about finding a way to make the mask easier to tolerate. Do not stop using the mask. Stopping the use of the mask could have a negative impact on your health. What are some tips for using the machine?  Place your CPAP or BPAP machine on a secure table or stand near an electrical outlet.  Know where the on/off switch is located on the machine.  Follow instructions from your health care provider about how to set the pressure on your machine and when you should use it.  Do not eat or drink while the CPAP or BPAP machine is on. Food or fluids could get pushed into your lungs by the pressure of the CPAP or BPAP.  Do not smoke. Tobacco smoke residue can damage the machine.  For home use, CPAP and BPAP machines can be rented or purchased through home health care companies. Many different brands of machines are available. Renting a machine before purchasing may help you find out which particular machine works well for you.  Keep the CPAP or BPAP machine and attachments clean. Ask your health care provider for specific instructions. Get help right away if:  You have  redness or open areas around your nose or mouth where the mask fits.  You have trouble using the CPAP or BPAP machine.  You cannot tolerate wearing the CPAP or BPAP mask.  You have pain, discomfort, and bloating in your abdomen. Summary  CPAP and BPAP are methods of helping a person breathe with the use of air pressure.  Both CPAP and BPAP are provided by a small machine with a flexible plastic tube that attaches to a plastic mask.  If you have trouble with the mask, it is very important that you talk with your health care provider about finding a way to make the mask easier to  tolerate. This information is not intended to replace advice given to you by your health care provider. Make sure you discuss any questions you have with your health care provider. Document Released: 07/29/2004 Document Revised: 07/03/2018 Document Reviewed: 09/19/2016 Elsevier Interactive Patient Education  2019 [employer].

## 2018-11-29 NOTE — Progress Notes (Signed)
Reviewed and agree with assessment/plan.   Mandy Peeks, MD [facility] 11/09/2016, 12:24 PM Pager:  [phone]  

## 2018-12-18 NOTE — Telephone Encounter (Signed)
This has been sent to [facility] at [employer].  They were to rebill her insurance.  I followed up with Viki again today and she indicated she has not heard back from the billing department.  However, she will inquire and get back to me.

## 2018-12-25 ENCOUNTER — Other Ambulatory Visit: Payer: Self-pay | Admitting: Internal Medicine

## 2019-01-03 ENCOUNTER — Other Ambulatory Visit: Payer: Self-pay

## 2019-01-03 MED ORDER — METOPROLOL TARTRATE 25 MG PO TABS: 25.0000 mg | ORAL_TABLET | Freq: Every morning | ORAL | 1 refills | Status: DC

## 2019-01-03 MED ORDER — TRAZODONE HCL 150 MG PO TABS: 150.0000 mg | ORAL_TABLET | Freq: Every day | ORAL | 1 refills | Status: DC

## 2019-01-13 ENCOUNTER — Other Ambulatory Visit: Payer: Self-pay

## 2019-01-13 ENCOUNTER — Emergency Department (HOSPITAL_COMMUNITY): Payer: Medicare Other

## 2019-01-13 ENCOUNTER — Encounter (HOSPITAL_COMMUNITY): Payer: Self-pay | Admitting: Emergency Medicine

## 2019-01-13 ENCOUNTER — Observation Stay (HOSPITAL_COMMUNITY)
Admission: EM | Admit: 2019-01-13 | Discharge: 2019-01-15 | Disposition: A | Discharge status: Home / Self Care | Payer: Medicare Other | Attending: Internal Medicine | Admitting: Internal Medicine

## 2019-01-13 DIAGNOSIS — Z886 Allergy status to analgesic agent status: Secondary | ICD-10-CM | POA: Insufficient documentation

## 2019-01-13 DIAGNOSIS — F329 Major depressive disorder, single episode, unspecified: Secondary | ICD-10-CM | POA: Diagnosis not present

## 2019-01-13 DIAGNOSIS — J45909 Unspecified asthma, uncomplicated: Secondary | ICD-10-CM | POA: Insufficient documentation

## 2019-01-13 DIAGNOSIS — I440 Atrioventricular block, first degree: Secondary | ICD-10-CM | POA: Insufficient documentation

## 2019-01-13 DIAGNOSIS — Z7901 Long term (current) use of anticoagulants: Secondary | ICD-10-CM | POA: Diagnosis not present

## 2019-01-13 DIAGNOSIS — E785 Hyperlipidemia, unspecified: Secondary | ICD-10-CM | POA: Diagnosis not present

## 2019-01-13 DIAGNOSIS — R0602 Shortness of breath: Secondary | ICD-10-CM | POA: Diagnosis not present

## 2019-01-13 DIAGNOSIS — Z794 Long term (current) use of insulin: Secondary | ICD-10-CM | POA: Insufficient documentation

## 2019-01-13 DIAGNOSIS — G4733 Obstructive sleep apnea (adult) (pediatric): Secondary | ICD-10-CM | POA: Insufficient documentation

## 2019-01-13 DIAGNOSIS — E119 Type 2 diabetes mellitus without complications: Secondary | ICD-10-CM | POA: Diagnosis not present

## 2019-01-13 DIAGNOSIS — F32A Depression, unspecified: Secondary | ICD-10-CM | POA: Diagnosis present

## 2019-01-13 DIAGNOSIS — Z885 Allergy status to narcotic agent status: Secondary | ICD-10-CM | POA: Diagnosis not present

## 2019-01-13 DIAGNOSIS — Z86711 Personal history of pulmonary embolism: Secondary | ICD-10-CM | POA: Diagnosis not present

## 2019-01-13 DIAGNOSIS — D72829 Elevated white blood cell count, unspecified: Secondary | ICD-10-CM

## 2019-01-13 DIAGNOSIS — I10 Essential (primary) hypertension: Secondary | ICD-10-CM | POA: Diagnosis not present

## 2019-01-13 DIAGNOSIS — K219 Gastro-esophageal reflux disease without esophagitis: Secondary | ICD-10-CM | POA: Diagnosis not present

## 2019-01-13 DIAGNOSIS — F419 Anxiety disorder, unspecified: Secondary | ICD-10-CM | POA: Insufficient documentation

## 2019-01-13 DIAGNOSIS — I25110 Atherosclerotic heart disease of native coronary artery with unstable angina pectoris: Secondary | ICD-10-CM | POA: Diagnosis not present

## 2019-01-13 DIAGNOSIS — Z888 Allergy status to other drugs, medicaments and biological substances status: Secondary | ICD-10-CM | POA: Insufficient documentation

## 2019-01-13 DIAGNOSIS — R1110 Vomiting, unspecified: Secondary | ICD-10-CM

## 2019-01-13 DIAGNOSIS — G43909 Migraine, unspecified, not intractable, without status migrainosus: Secondary | ICD-10-CM | POA: Insufficient documentation

## 2019-01-13 DIAGNOSIS — G4700 Insomnia, unspecified: Secondary | ICD-10-CM | POA: Insufficient documentation

## 2019-01-13 DIAGNOSIS — Z79899 Other long term (current) drug therapy: Secondary | ICD-10-CM | POA: Diagnosis not present

## 2019-01-13 DIAGNOSIS — R9431 Abnormal electrocardiogram [ECG] [EKG]: Secondary | ICD-10-CM | POA: Diagnosis present

## 2019-01-13 DIAGNOSIS — Z9981 Dependence on supplemental oxygen: Secondary | ICD-10-CM | POA: Insufficient documentation

## 2019-01-13 DIAGNOSIS — Z6841 Body Mass Index (BMI) 40.0 and over, adult: Secondary | ICD-10-CM | POA: Diagnosis not present

## 2019-01-13 DIAGNOSIS — E7849 Other hyperlipidemia: Secondary | ICD-10-CM | POA: Diagnosis present

## 2019-01-13 DIAGNOSIS — R079 Chest pain, unspecified: Secondary | ICD-10-CM

## 2019-01-13 DIAGNOSIS — E039 Hypothyroidism, unspecified: Secondary | ICD-10-CM | POA: Insufficient documentation

## 2019-01-13 DIAGNOSIS — I200 Unstable angina: Secondary | ICD-10-CM | POA: Clinically undetermined

## 2019-01-13 LAB — HEPATIC FUNCTION PANEL
ALK PHOS: 70 U/L (ref 38–126)
ALT: 28 U/L (ref 0–44)
AST: 47 U/L — AB (ref 15–41)
Albumin: 3.5 g/dL (ref 3.5–5.0)
BILIRUBIN DIRECT: 0.9 mg/dL — AB (ref 0.0–0.2)
Indirect Bilirubin: 0.5 mg/dL (ref 0.3–0.9)
Total Bilirubin: 1.4 mg/dL — ABNORMAL HIGH (ref 0.3–1.2)
Total Protein: 7.2 g/dL (ref 6.5–8.1)

## 2019-01-13 LAB — I-STAT TROPONIN, ED: Troponin i, poc: 0.01 ng/mL (ref 0.00–0.08)

## 2019-01-13 LAB — CBC
HCT: 43.7 % (ref 36.0–46.0)
Hemoglobin: 13.5 g/dL (ref 12.0–15.0)
MCH: 26.6 pg (ref 26.0–34.0)
MCHC: 30.9 g/dL (ref 30.0–36.0)
MCV: 86.2 fL (ref 80.0–100.0)
Platelets: 256 10*3/uL (ref 150–400)
RBC: 5.07 MIL/uL (ref 3.87–5.11)
RDW: 14.5 % (ref 11.5–15.5)
WBC: 15.2 10*3/uL — ABNORMAL HIGH (ref 4.0–10.5)
nRBC: 0 % (ref 0.0–0.2)

## 2019-01-13 LAB — BASIC METABOLIC PANEL
Anion gap: 11 (ref 5–15)
BUN: 14 mg/dL (ref 8–23)
CO2: 22 mmol/L (ref 22–32)
CREATININE: 0.85 mg/dL (ref 0.44–1.00)
Calcium: 8.7 mg/dL — ABNORMAL LOW (ref 8.9–10.3)
Chloride: 98 mmol/L (ref 98–111)
GFR calc Af Amer: >60 mL/min (ref >60)
GFR calc non Af Amer: >60 mL/min (ref >60)
Glucose, Bld: 129 mg/dL — ABNORMAL HIGH (ref 70–99)
Potassium: 6.2 mmol/L — ABNORMAL HIGH (ref 3.5–5.1)
Sodium: 131 mmol/L — ABNORMAL LOW (ref 135–145)

## 2019-01-13 LAB — LIPASE, BLOOD: Lipase: 21 U/L (ref 11–51)

## 2019-01-13 MED ORDER — SODIUM CHLORIDE 0.9% FLUSH: 3.0000 mL | Freq: Once | INTRAVENOUS | Status: DC

## 2019-01-13 MED ORDER — NITROGLYCERIN 0.4 MG SL SUBL
0.4000 mg | SUBLINGUAL_TABLET | SUBLINGUAL | Status: AC | PRN
  Administered 2019-01-13 (×3): 0.4 mg via SUBLINGUAL
  Filled 2019-01-13: qty 1

## 2019-01-13 MED ORDER — FENTANYL CITRATE (PF) 100 MCG/2ML IJ SOLN
50.0000 ug | Freq: Once | INTRAMUSCULAR | Status: AC
  Administered 2019-01-13: 50 ug via INTRAVENOUS
  Filled 2019-01-13: qty 2

## 2019-01-13 NOTE — ED Triage Notes (Signed)
Pt reports left sided CP that radiates to her back, began at 0900.  Reports multiple episodes of "dry heaves" around 1000 with intermittent nausea. Denies any SOB. Has not taken anything, takes Xarelto.

## 2019-01-13 NOTE — ED Provider Notes (Signed)
[facility] EMERGENCY DEPARTMENT Provider Note   CSN: [id number] Arrival date & time: 01/13/19  2154    History   Chief Complaint Chief Complaint  Patient presents with  . Chest Pain    HPI [name] is a 68 y.o. female.     HPI  68 yo female with onset of sscp this am about 0900 while at church.  Patient reports pressure type pain.  NO interventions made it better or worse.  She did have nausea and several episodes of vomiting.  Pain radiates to back. She has not had similar pain in past.  No associated dyspnea, cough,   Past Medical History:  Diagnosis Date  . Anemia    none recently  . Anxiety   . Arthritis   . Asthma   . Complete tear of right rotator cuff 12/05/2014  . Depression   . Diabetes mellitus    not on meds.denies a1c 5.8  . Diverticulitis   . Diverticulosis   . Family history of adverse reaction to anesthesia    mom  nausea and vomiting  . GERD (gastroesophageal reflux disease)   . Hypertension   . Hypothyroidism   . Migraine   . Personal history of kidney stones   . Pneumonia    hx  . Pulmonary embolism ([place])    a. diagnosed in 01/2017 w/ imaging showing multiple right lung PE with right heart strain. Started on Xarelto.   . Shortness of breath dyspnea    exersion  . Thyroid disease    hypo  . Wears glasses     Patient Active Problem List   Diagnosis Date Noted  . OSA (obstructive sleep apnea) 11/27/2018  . Pulmonary embolism with acute cor pulmonale ([place]) 01/19/2017  . Acute saddle pulmonary embolism with acute cor pulmonale ([facility]) 01/19/2017  . Preoperative cardiovascular examination 08/04/2016  . Abnormal EKG 08/04/2016  . Complete tear of right rotator cuff 12/05/2014  . Wound healing, delayed 05/06/2013  . Candidal skin infection 05/06/2013  . H/O diverticulitis of SIGMOID and left colon s/p laparoscopic assisted left colectomy 04/22/13 02/15/2013  . Diverticulitis of proximal descending colon 02/07/2013  .  Hypokalemia 01/26/2013  . Type 2 diabetes mellitus ([place]) 08/13/2012  . Morbid obesity ([place]) 08/13/2012  . Insomnia 08/13/2012  . History of ulcerative colitis 08/13/2012  . Musculoskeletal pain 08/13/2012  . Constipation 02/07/2012  . Hyperlipidemia 02/07/2012  . History of migraine headaches 02/07/2012  . Lichen planus 05/39/7673  . Allergic rhinitis 02/07/2012  . Angioedema 02/07/2012  . GERD 02/26/2010  . VITAMIN B12 DEFICIENCY 05/05/2008  . Hypothyroidism 04/29/2008  . Depression 04/29/2008  . Essential hypertension 04/29/2008  . ASTHMA 04/29/2008  . DIVERTICULOSIS, COLON 12/11/2006  . H/O: hysterectomy 02/15/1998    Past Surgical History:  Procedure Laterality Date  . ABDOMINAL HYSTERECTOMY    . CATARACT EXTRACTION, BILATERAL    . CESAREAN SECTION    . COLONOSCOPY     multiple   . ESOPHAGOGASTRODUODENOSCOPY ENDOSCOPY     multiple  . KNEE CARTILAGE SURGERY     Left  . LAPAROSCOPIC PARTIAL COLECTOMY N/A 04/22/2013   Procedure: LAPAROSCOPIC PARTIAL COLECTOMY;  Surgeon: Odis Hollingshead, MD;  Location: [facility];  Service: General;  Laterality: N/A;  . right foot surgery  little toe and next toe   x 3  . SHOULDER ARTHROSCOPY WITH ROTATOR CUFF REPAIR AND SUBACROMIAL DECOMPRESSION Right 12/05/2014   Procedure: RIGHT SHOULDER ARTHROSCOPY,ACROMOPLASTY, ROTATOR CUFF REPAIR;  Surgeon: [name], MD;  Location: [facility];  Service: Orthopedics;  Laterality: Right;     OB History   No obstetric history on file.      Home Medications    Prior to Admission medications   Medication Sig Start Date End Date Taking? Authorizing Provider  ALPRAZolam Duanne Moron) 0.5 MG tablet TAKE 1 TABLET BY MOUTH TWICE A DAY AS NEEDED 10/04/18   Elby Showers, MD  cetirizine (ZYRTEC) 10 MG tablet Take 10 mg by mouth at bedtime.     [provider]  Cholecalciferol (VITAMIN D3) 5000 units CAPS Take 5,000 Units by mouth daily.    [provider]  clobetasol  ointment (TEMOVATE) 3.38 % Apply 1 application topically 2 (two) times daily as needed (skin irritation).    [provider]  clotrimazole (MYCELEX) 10 MG troche TAKE 3 TIMES DAILY UNTIL SYMPTOMS IMPROVE. THEN TAKE 1 TABLET DAILY. 07/28/17   [provider]  EPINEPHRINE 0.3 mg/0.3 mL IJ SOAJ injection INJECT 0.3 MLS INTO THE MUSCLE ONCE. 05/19/15   Elby Showers, MD  escitalopram (LEXAPRO) 10 MG tablet TAKE 1 TABLET (10 MG TOTAL) DAILY BY MOUTH. 12/25/18   Elby Showers, MD  esomeprazole (NEXIUM) 40 MG capsule TAKE 1 CAPSULE BY MOUTH TWICE DAILY 11/20/15   Elby Showers, MD  famotidine (PEPCID) 10 MG tablet Take 10 mg by mouth at bedtime.    [provider]  furosemide (LASIX) 20 MG tablet TAKE 1 TABLET BY MOUTH EVERY DAY 03/16/18   Elby Showers, MD  levothyroxine (SYNTHROID, LEVOTHROID) 200 MCG tablet TAKE 1 TABLET BY MOUTH EVERY DAY ON EMPTY STOMACH BEFORE BREAKFAST 03/16/18   Elby Showers, MD  metoprolol tartrate (LOPRESSOR) 25 MG tablet Take 1 tablet (25 mg total) by mouth every morning. 01/03/19   Elby Showers, MD  montelukast (SINGULAIR) 10 MG tablet Take 1 tablet (10 mg total) by mouth at bedtime. 01/19/18   Elby Showers, MD  olmesartan (BENICAR) 40 MG tablet TAKE 1 TABLET BY MOUTH DAILY 09/14/18   Elby Showers, MD  OSCIMIN SR 0.375 MG 12 hr tablet Take 0.357 mg by mouth daily. 05/24/16   [provider]  traZODone (DESYREL) 150 MG tablet Take 1 tablet (150 mg total) by mouth at bedtime. 01/03/19   Elby Showers, MD  VENTOLIN HFA [phone] Base) MCG/ACT inhaler USE 2 PUFFS EVERY 6 HOURS AS NEEDED FOR WHEEZING 07/05/18   Elby Showers, MD  VITAMIN D, ERGOCALCIFEROL, PO Take 5,000 Units by mouth daily.    [provider]  XARELTO 20 MG TABS tablet TAKE 1 TABLET (20 MG TOTAL) BY MOUTH DAILY WITH SUPPER. 04/11/18   Belva Crome, MD  zolpidem (AMBIEN) 10 MG tablet TAKE 1 TABLET BY MOUTH AT BEDTIME AS NEEDED 10/04/18   Elby Showers, MD    Family  History Family History  Problem Relation Age of Onset  . Heart disease Mother   . Kidney failure Father   . Diabetes Father   . Breast cancer Paternal Aunt   . Colon cancer Neg Hx     Social History Social History   Tobacco Use  . Smoking status: Former Smoker    Packs/day: 0.50    Years: 6.00    Pack years: 3.00    Types: Cigarettes    Last attempt to quit: 11/14/1982    Years since quitting: 36.1  . Smokeless tobacco: Never Used  . Tobacco comment: quit smoking 30 years ago  Substance Use Topics  . Alcohol use: No  . Drug use: No     Allergies   Lisinopril; Bee venom; and Morphine and related   Review of Systems Review of Systems   Physical Exam Updated Vital Signs BP (!) 213/95   Pulse 89   Temp 98.4 F (36.9 C) (Oral)   Resp 14   SpO2 96%  Vitals:   01/13/19 2315 01/13/19 2345  BP: (!) 163/88 (!) 145/63  Pulse:  84  Resp: (!) 22 16  Temp:    SpO2:  92%    Physical Exam Vitals signs and nursing note reviewed.  Constitutional:      Appearance: She is obese.  HENT:     Head: Normocephalic and atraumatic.  Eyes:     Pupils: Pupils are equal, round, and reactive to light.  Neck:     Musculoskeletal: Normal range of motion.  Cardiovascular:     Rate and Rhythm: Normal rate and regular rhythm.     Heart sounds: Normal heart sounds.  Pulmonary:     Effort: Pulmonary effort is normal.     Breath sounds: Normal breath sounds.  Abdominal:     General: Bowel sounds are normal.     Palpations: Abdomen is soft.  Musculoskeletal: Normal range of motion.  Skin:    General: Skin is warm and dry.  Neurological:     General: No focal deficit present.     Mental Status: She is alert. She is disoriented.      ED Treatments / Results  Labs (all labs ordered are listed, but only abnormal results are displayed) Labs Reviewed  BASIC METABOLIC PANEL  CBC  I-STAT TROPONIN, ED    EKG EKG Interpretation  Date/Time:  Sunday January 13 2019 22:00:24  EST Ventricular Rate:  89 PR Interval:  256 QRS Duration: 86 QT Interval:  360 QTC Calculation: 438 R Axis:   64 Text Interpretation:  Sinus rhythm with 1st degree A-V block Otherwise normal ECG Non-specific ST-t changes avr and v1 Confirmed by Pattricia Boss [id number]) on 01/14/2019 12:30:21 AM   Radiology Dg Chest Port 1 View  Result Date: 01/13/2019 CLINICAL DATA:  Left-sided chest pain.  Shortness of breath. EXAM: PORTABLE CHEST 1 VIEW COMPARISON:  December 27, 2016 FINDINGS: The heart size is borderline but stable. The hila and mediastinum are unremarkable. No pulmonary nodules or masses. No focal infiltrates. No overt edema. IMPRESSION: No active disease. Electronically Signed   By: Dorise Bullion III M.D   On: 01/13/2019 22:52    Procedures Procedures (including critical care time)  Medications Ordered in ED Medications  sodium chloride flush (NS) 0.9 % injection 3 mL (has no administration in time range)     Initial Impression / Assessment and Plan / ED Course  I have reviewed the triage vital signs and the nursing notes.  Pertinent labs & imaging results that were available during my care of the patient were reviewed by me and considered in my medical decision making (see chart for details).        Initial ekg reviewed and discussed with DR. [place]- he does not think meets stemi criteria-  Patient initial slightly elevated bp- however, reviewing bps it is noted that she had some signficantly elevated bp .  Now sbp 130s after nitro Plan admission for ongoing sscp with concern for acs Hyperkalemia noted but suspect factitious- recheck pending Discussed with DR. Rathore and will see for admission Final Clinical Impressions(s) / ED Diagnoses  Final diagnoses:  Chest pain    ED Discharge Orders    None       Pattricia Boss, MD 01/14/19 [id number]

## 2019-01-14 ENCOUNTER — Other Ambulatory Visit: Payer: Self-pay

## 2019-01-14 ENCOUNTER — Encounter (HOSPITAL_COMMUNITY)
Admission: EM | Disposition: A | Discharge status: Home / Self Care | Payer: Self-pay | Source: Home / Self Care | Attending: Emergency Medicine

## 2019-01-14 ENCOUNTER — Observation Stay (HOSPITAL_COMMUNITY): Payer: Medicare Other

## 2019-01-14 ENCOUNTER — Observation Stay (HOSPITAL_BASED_OUTPATIENT_CLINIC_OR_DEPARTMENT_OTHER): Payer: Medicare Other

## 2019-01-14 DIAGNOSIS — I340 Nonrheumatic mitral (valve) insufficiency: Secondary | ICD-10-CM

## 2019-01-14 DIAGNOSIS — E785 Hyperlipidemia, unspecified: Secondary | ICD-10-CM | POA: Diagnosis not present

## 2019-01-14 DIAGNOSIS — J45909 Unspecified asthma, uncomplicated: Secondary | ICD-10-CM | POA: Diagnosis not present

## 2019-01-14 DIAGNOSIS — I361 Nonrheumatic tricuspid (valve) insufficiency: Secondary | ICD-10-CM

## 2019-01-14 DIAGNOSIS — E1100 Type 2 diabetes mellitus with hyperosmolarity without nonketotic hyperglycemic-hyperosmolar coma (NKHHC): Secondary | ICD-10-CM | POA: Diagnosis not present

## 2019-01-14 DIAGNOSIS — E119 Type 2 diabetes mellitus without complications: Secondary | ICD-10-CM | POA: Diagnosis not present

## 2019-01-14 DIAGNOSIS — I200 Unstable angina: Secondary | ICD-10-CM | POA: Clinically undetermined

## 2019-01-14 DIAGNOSIS — Z86711 Personal history of pulmonary embolism: Secondary | ICD-10-CM | POA: Diagnosis not present

## 2019-01-14 DIAGNOSIS — E039 Hypothyroidism, unspecified: Secondary | ICD-10-CM | POA: Diagnosis not present

## 2019-01-14 DIAGNOSIS — Z6841 Body Mass Index (BMI) 40.0 and over, adult: Secondary | ICD-10-CM | POA: Diagnosis not present

## 2019-01-14 DIAGNOSIS — F329 Major depressive disorder, single episode, unspecified: Secondary | ICD-10-CM

## 2019-01-14 DIAGNOSIS — Z7901 Long term (current) use of anticoagulants: Secondary | ICD-10-CM | POA: Diagnosis not present

## 2019-01-14 DIAGNOSIS — R112 Nausea with vomiting, unspecified: Secondary | ICD-10-CM

## 2019-01-14 DIAGNOSIS — K8020 Calculus of gallbladder without cholecystitis without obstruction: Secondary | ICD-10-CM | POA: Diagnosis not present

## 2019-01-14 DIAGNOSIS — I2510 Atherosclerotic heart disease of native coronary artery without angina pectoris: Secondary | ICD-10-CM | POA: Diagnosis not present

## 2019-01-14 DIAGNOSIS — D72829 Elevated white blood cell count, unspecified: Secondary | ICD-10-CM

## 2019-01-14 DIAGNOSIS — I25110 Atherosclerotic heart disease of native coronary artery with unstable angina pectoris: Secondary | ICD-10-CM | POA: Diagnosis not present

## 2019-01-14 DIAGNOSIS — K219 Gastro-esophageal reflux disease without esophagitis: Secondary | ICD-10-CM | POA: Diagnosis not present

## 2019-01-14 DIAGNOSIS — G4733 Obstructive sleep apnea (adult) (pediatric): Secondary | ICD-10-CM | POA: Diagnosis not present

## 2019-01-14 DIAGNOSIS — R079 Chest pain, unspecified: Secondary | ICD-10-CM | POA: Diagnosis not present

## 2019-01-14 DIAGNOSIS — F419 Anxiety disorder, unspecified: Secondary | ICD-10-CM | POA: Diagnosis not present

## 2019-01-14 DIAGNOSIS — I10 Essential (primary) hypertension: Secondary | ICD-10-CM | POA: Diagnosis not present

## 2019-01-14 DIAGNOSIS — G43909 Migraine, unspecified, not intractable, without status migrainosus: Secondary | ICD-10-CM | POA: Diagnosis not present

## 2019-01-14 DIAGNOSIS — E7849 Other hyperlipidemia: Secondary | ICD-10-CM

## 2019-01-14 DIAGNOSIS — I440 Atrioventricular block, first degree: Secondary | ICD-10-CM | POA: Diagnosis not present

## 2019-01-14 DIAGNOSIS — R1110 Vomiting, unspecified: Secondary | ICD-10-CM

## 2019-01-14 HISTORY — PX: CORONARY PRESSURE/FFR STUDY: CATH118243

## 2019-01-14 HISTORY — PX: LEFT HEART CATH AND CORONARY ANGIOGRAPHY: CATH118249

## 2019-01-14 LAB — BASIC METABOLIC PANEL
Anion gap: 12 (ref 5–15)
BUN: 15 mg/dL (ref 8–23)
CO2: 22 mmol/L (ref 22–32)
Calcium: 8.9 mg/dL (ref 8.9–10.3)
Chloride: 103 mmol/L (ref 98–111)
Creatinine, Ser: 0.99 mg/dL (ref 0.44–1.00)
GFR calc Af Amer: >60 mL/min (ref >60)
GFR calc non Af Amer: 59 mL/min — ABNORMAL LOW (ref >60)
Glucose, Bld: 119 mg/dL — ABNORMAL HIGH (ref 70–99)
Potassium: 4.2 mmol/L (ref 3.5–5.1)
Sodium: 137 mmol/L (ref 135–145)

## 2019-01-14 LAB — HIV ANTIBODY (ROUTINE TESTING W REFLEX): HIV Screen 4th Generation wRfx: NONREACTIVE

## 2019-01-14 LAB — HEPATIC FUNCTION PANEL
ALT: 15 U/L (ref 0–44)
AST: 26 U/L (ref 15–41)
Albumin: 3.6 g/dL (ref 3.5–5.0)
Alkaline Phosphatase: 71 U/L (ref 38–126)
BILIRUBIN TOTAL: 1.2 mg/dL (ref 0.3–1.2)
Bilirubin, Direct: 0.3 mg/dL — ABNORMAL HIGH (ref 0.0–0.2)
Indirect Bilirubin: 0.9 mg/dL (ref 0.3–0.9)
Total Protein: 7.2 g/dL (ref 6.5–8.1)

## 2019-01-14 LAB — TSH: TSH: 1.778 u[IU]/mL (ref 0.350–4.500)

## 2019-01-14 LAB — TROPONIN I
Troponin I: <0.03 ng/mL (ref <0.03)
Troponin I: <0.03 ng/mL (ref <0.03)

## 2019-01-14 LAB — CBC
HCT: 41.3 % (ref 36.0–46.0)
Hemoglobin: 12.7 g/dL (ref 12.0–15.0)
MCH: 26.7 pg (ref 26.0–34.0)
MCHC: 30.8 g/dL (ref 30.0–36.0)
MCV: 86.8 fL (ref 80.0–100.0)
PLATELETS: 233 10*3/uL (ref 150–400)
RBC: 4.76 MIL/uL (ref 3.87–5.11)
RDW: 14.3 % (ref 11.5–15.5)
WBC: 16.8 10*3/uL — ABNORMAL HIGH (ref 4.0–10.5)
nRBC: 0 % (ref 0.0–0.2)

## 2019-01-14 LAB — POCT ACTIVATED CLOTTING TIME
ACTIVATED CLOTTING TIME: 274 s
Activated Clotting Time: 202 seconds

## 2019-01-14 LAB — HEMOGLOBIN A1C
Hgb A1c MFr Bld: 6.1 % — ABNORMAL HIGH (ref 4.8–5.6)
Mean Plasma Glucose: 128.37 mg/dL

## 2019-01-14 LAB — ECHOCARDIOGRAM COMPLETE
Height: 65 in
WEIGHTICAEL: 4896 [oz_av]

## 2019-01-14 LAB — GLUCOSE, CAPILLARY
GLUCOSE-CAPILLARY: 122 mg/dL — AB (ref 70–99)
Glucose-Capillary: 134 mg/dL — ABNORMAL HIGH (ref 70–99)
Glucose-Capillary: 123 mg/dL — ABNORMAL HIGH (ref 70–99)
Glucose-Capillary: 134 mg/dL — ABNORMAL HIGH (ref 70–99)

## 2019-01-14 LAB — D-DIMER, QUANTITATIVE: D-Dimer, Quant: 0.69 ug/mL-FEU — ABNORMAL HIGH (ref 0.00–0.50)

## 2019-01-14 LAB — POTASSIUM: Potassium: 4.1 mmol/L (ref 3.5–5.1)

## 2019-01-14 SURGERY — LEFT HEART CATH AND CORONARY ANGIOGRAPHY
Anesthesia: LOCAL

## 2019-01-14 MED ORDER — SODIUM CHLORIDE 0.9% FLUSH: 3.0000 mL | INTRAVENOUS | Status: DC | PRN

## 2019-01-14 MED ORDER — SODIUM CHLORIDE 0.9 % IV SOLN: 250.0000 mL | INTRAVENOUS | Status: DC | PRN

## 2019-01-14 MED ORDER — SODIUM CHLORIDE 0.9 % WEIGHT BASED INFUSION: 1.0000 mL/kg/h | INTRAVENOUS | Status: DC

## 2019-01-14 MED ORDER — SODIUM CHLORIDE 0.9 % WEIGHT BASED INFUSION: 3.0000 mL/kg/h | INTRAVENOUS | Status: DC

## 2019-01-14 MED ORDER — ADENOSINE (DIAGNOSTIC) 140MCG/KG/MIN
INTRAVENOUS | Status: DC | PRN
  Administered 2019-01-14: 140 ug/kg/min via INTRAVENOUS

## 2019-01-14 MED ORDER — ESCITALOPRAM OXALATE 10 MG PO TABS
10.0000 mg | ORAL_TABLET | Freq: Every day | ORAL | Status: DC
  Administered 2019-01-14 – 2019-01-15 (×2): 10 mg via ORAL
  Filled 2019-01-14 (×2): qty 1

## 2019-01-14 MED ORDER — SODIUM CHLORIDE 0.9 % IV SOLN
INTRAVENOUS | Status: AC
  Administered 2019-01-14: 18:00:00 via INTRAVENOUS

## 2019-01-14 MED ORDER — HEPARIN SODIUM (PORCINE) 1000 UNIT/ML IJ SOLN
INTRAMUSCULAR | Status: AC
  Filled 2019-01-14: qty 1

## 2019-01-14 MED ORDER — METOPROLOL TARTRATE 25 MG PO TABS
25.0000 mg | ORAL_TABLET | Freq: Every morning | ORAL | Status: DC
  Administered 2019-01-14 – 2019-01-15 (×2): 25 mg via ORAL
  Filled 2019-01-14 (×2): qty 1

## 2019-01-14 MED ORDER — ONDANSETRON HCL 4 MG/2ML IJ SOLN
4.0000 mg | Freq: Four times a day (QID) | INTRAMUSCULAR | Status: DC | PRN
  Filled 2019-01-14: qty 2

## 2019-01-14 MED ORDER — MIDAZOLAM HCL 2 MG/2ML IJ SOLN
INTRAMUSCULAR | Status: AC
  Filled 2019-01-14: qty 2

## 2019-01-14 MED ORDER — FENTANYL CITRATE (PF) 100 MCG/2ML IJ SOLN
INTRAMUSCULAR | Status: AC
  Filled 2019-01-14: qty 2

## 2019-01-14 MED ORDER — INSULIN ASPART 100 UNIT/ML ~~LOC~~ SOLN: 0.0000 [IU] | Freq: Every day | SUBCUTANEOUS | Status: DC

## 2019-01-14 MED ORDER — INSULIN ASPART 100 UNIT/ML ~~LOC~~ SOLN
0.0000 [IU] | Freq: Three times a day (TID) | SUBCUTANEOUS | Status: DC
  Administered 2019-01-14: 1 [IU] via SUBCUTANEOUS

## 2019-01-14 MED ORDER — ACETAMINOPHEN 325 MG PO TABS
650.0000 mg | ORAL_TABLET | ORAL | Status: DC | PRN
  Administered 2019-01-14 – 2019-01-15 (×5): 650 mg via ORAL
  Filled 2019-01-14 (×5): qty 2

## 2019-01-14 MED ORDER — ALPRAZOLAM 0.5 MG PO TABS
0.5000 mg | ORAL_TABLET | Freq: Two times a day (BID) | ORAL | Status: DC | PRN
  Administered 2019-01-14: 0.5 mg via ORAL
  Filled 2019-01-14: qty 2

## 2019-01-14 MED ORDER — HEPARIN SODIUM (PORCINE) 5000 UNIT/ML IJ SOLN
5000.0000 [IU] | Freq: Three times a day (TID) | INTRAMUSCULAR | Status: AC
  Administered 2019-01-14 – 2019-01-15 (×2): 5000 [IU] via SUBCUTANEOUS
  Filled 2019-01-14 (×2): qty 1

## 2019-01-14 MED ORDER — HEPARIN (PORCINE) IN NACL 1000-0.9 UT/500ML-% IV SOLN
INTRAVENOUS | Status: DC | PRN
  Administered 2019-01-14 (×2): 500 mL

## 2019-01-14 MED ORDER — ACETAMINOPHEN 325 MG PO TABS: 650.0000 mg | ORAL_TABLET | ORAL | Status: DC | PRN

## 2019-01-14 MED ORDER — VERAPAMIL HCL 2.5 MG/ML IV SOLN
INTRAVENOUS | Status: AC
  Filled 2019-01-14: qty 2

## 2019-01-14 MED ORDER — HEPARIN SODIUM (PORCINE) 1000 UNIT/ML IJ SOLN
INTRAMUSCULAR | Status: DC | PRN
  Administered 2019-01-14: 6000 [IU] via INTRAVENOUS
  Administered 2019-01-14: 4000 [IU] via INTRAVENOUS
  Administered 2019-01-14: 3500 [IU] via INTRAVENOUS

## 2019-01-14 MED ORDER — ATORVASTATIN CALCIUM 80 MG PO TABS
80.0000 mg | ORAL_TABLET | Freq: Every day | ORAL | Status: DC
  Administered 2019-01-14: 80 mg via ORAL
  Filled 2019-01-14: qty 1

## 2019-01-14 MED ORDER — PANTOPRAZOLE SODIUM 40 MG PO TBEC
40.0000 mg | DELAYED_RELEASE_TABLET | Freq: Every day | ORAL | Status: DC
  Administered 2019-01-14 – 2019-01-15 (×2): 40 mg via ORAL
  Filled 2019-01-14 (×2): qty 1

## 2019-01-14 MED ORDER — FAMOTIDINE 20 MG PO TABS
10.0000 mg | ORAL_TABLET | Freq: Every day | ORAL | Status: DC
  Administered 2019-01-14: 10 mg via ORAL
  Filled 2019-01-14: qty 1

## 2019-01-14 MED ORDER — SODIUM CHLORIDE 0.9% FLUSH: 3.0000 mL | Freq: Two times a day (BID) | INTRAVENOUS | Status: DC

## 2019-01-14 MED ORDER — LIDOCAINE HCL (PF) 1 % IJ SOLN
INTRAMUSCULAR | Status: AC
  Filled 2019-01-14: qty 30

## 2019-01-14 MED ORDER — RIVAROXABAN 20 MG PO TABS
20.0000 mg | ORAL_TABLET | Freq: Every day | ORAL | Status: DC
  Administered 2019-01-15: 20 mg via ORAL
  Filled 2019-01-14: qty 1

## 2019-01-14 MED ORDER — FENTANYL CITRATE (PF) 100 MCG/2ML IJ SOLN
INTRAMUSCULAR | Status: DC | PRN
  Administered 2019-01-14 (×3): 25 ug via INTRAVENOUS

## 2019-01-14 MED ORDER — HEPARIN (PORCINE) IN NACL 1000-0.9 UT/500ML-% IV SOLN
INTRAVENOUS | Status: AC
  Filled 2019-01-14: qty 1000

## 2019-01-14 MED ORDER — NITROGLYCERIN IN D5W 200-5 MCG/ML-% IV SOLN
0.0000 ug/min | INTRAVENOUS | Status: DC
  Administered 2019-01-14: 5 ug/min via INTRAVENOUS
  Administered 2019-01-14: 20 ug/min via INTRAVENOUS
  Filled 2019-01-14: qty 250

## 2019-01-14 MED ORDER — LEVOTHYROXINE SODIUM 100 MCG PO TABS
200.0000 ug | ORAL_TABLET | Freq: Every day | ORAL | Status: DC
  Administered 2019-01-14 – 2019-01-15 (×2): 200 ug via ORAL
  Filled 2019-01-14: qty 4
  Filled 2019-01-14: qty 2

## 2019-01-14 MED ORDER — MIDAZOLAM HCL 2 MG/2ML IJ SOLN
INTRAMUSCULAR | Status: DC | PRN
  Administered 2019-01-14: 0.5 mg via INTRAVENOUS

## 2019-01-14 MED ORDER — ASPIRIN 81 MG PO CHEW
324.0000 mg | CHEWABLE_TABLET | Freq: Once | ORAL | Status: AC
  Administered 2019-01-14: 324 mg via ORAL
  Filled 2019-01-14: qty 4

## 2019-01-14 MED ORDER — RIVAROXABAN 20 MG PO TABS: 20.0000 mg | ORAL_TABLET | Freq: Every day | ORAL | Status: DC

## 2019-01-14 MED ORDER — ADENOSINE 12 MG/4ML IV SOLN
INTRAVENOUS | Status: AC
  Filled 2019-01-14: qty 16

## 2019-01-14 MED ORDER — ONDANSETRON HCL 4 MG/2ML IJ SOLN: 4.0000 mg | Freq: Four times a day (QID) | INTRAMUSCULAR | Status: DC | PRN

## 2019-01-14 MED ORDER — VERAPAMIL HCL 2.5 MG/ML IV SOLN
INTRAVENOUS | Status: DC | PRN
  Administered 2019-01-14: 10 mL via INTRA_ARTERIAL

## 2019-01-14 MED ORDER — MONTELUKAST SODIUM 10 MG PO TABS
10.0000 mg | ORAL_TABLET | Freq: Every day | ORAL | Status: DC
  Filled 2019-01-14: qty 1

## 2019-01-14 MED ORDER — IOHEXOL 350 MG/ML SOLN
INTRAVENOUS | Status: DC | PRN
  Administered 2019-01-14: 155 mL via INTRA_ARTERIAL

## 2019-01-14 MED ORDER — PERFLUTREN LIPID MICROSPHERE
1.0000 mL | INTRAVENOUS | Status: AC | PRN
  Administered 2019-01-14: 2 mL via INTRAVENOUS
  Filled 2019-01-14: qty 10

## 2019-01-14 MED ORDER — LORATADINE 10 MG PO TABS: 10.0000 mg | ORAL_TABLET | Freq: Every day | ORAL | Status: DC | PRN

## 2019-01-14 MED ORDER — SODIUM CHLORIDE 0.9% FLUSH
3.0000 mL | Freq: Two times a day (BID) | INTRAVENOUS | Status: DC
  Administered 2019-01-15: 3 mL via INTRAVENOUS

## 2019-01-14 MED ORDER — ZOLPIDEM TARTRATE 5 MG PO TABS: 5.0000 mg | ORAL_TABLET | Freq: Every evening | ORAL | Status: DC | PRN

## 2019-01-14 MED ORDER — LIDOCAINE HCL (PF) 1 % IJ SOLN
INTRAMUSCULAR | Status: DC | PRN
  Administered 2019-01-14: 2 mL

## 2019-01-14 MED ORDER — ZOLPIDEM TARTRATE 10 MG PO TABS: 10.0000 mg | ORAL_TABLET | Freq: Every evening | ORAL | 0 refills | Status: DC | PRN

## 2019-01-14 MED ORDER — HYOSCYAMINE SULFATE ER 0.375 MG PO TB12
0.3570 mg | ORAL_TABLET | Freq: Every day | ORAL | Status: DC
  Administered 2019-01-14 – 2019-01-15 (×2): 0.375 mg via ORAL
  Filled 2019-01-14 (×2): qty 1

## 2019-01-14 MED ORDER — ACETAMINOPHEN 500 MG PO TABS: 1000.0000 mg | ORAL_TABLET | Freq: Four times a day (QID) | ORAL | Status: DC | PRN

## 2019-01-14 MED ORDER — TRAZODONE HCL 50 MG PO TABS
150.0000 mg | ORAL_TABLET | Freq: Every day | ORAL | Status: DC
  Administered 2019-01-14: 150 mg via ORAL
  Filled 2019-01-14: qty 1

## 2019-01-14 MED ORDER — ALBUTEROL SULFATE (2.5 MG/3ML) 0.083% IN NEBU
2.5000 mg | INHALATION_SOLUTION | Freq: Four times a day (QID) | RESPIRATORY_TRACT | Status: DC | PRN
  Administered 2019-01-14: 2.5 mg via RESPIRATORY_TRACT
  Filled 2019-01-14: qty 3

## 2019-01-14 MED ORDER — HEPARIN SODIUM (PORCINE) 5000 UNIT/ML IJ SOLN: 5000.0000 [IU] | Freq: Three times a day (TID) | INTRAMUSCULAR | Status: DC

## 2019-01-14 SURGICAL SUPPLY — 14 items
CATH 5FR JL3.5 JR4 ANG PIG MP (CATHETERS) ×1 IMPLANT
CATH VISTA GUIDE 6FR XB3 (CATHETERS) ×1 IMPLANT
DEVICE RAD COMP TR BAND LRG (VASCULAR PRODUCTS) ×1 IMPLANT
GLIDESHEATH SLEND A-KIT 6F 22G (SHEATH) ×1 IMPLANT
GUIDEWIRE INQWIRE 1.5J.035X260 (WIRE) IMPLANT
GUIDEWIRE PRESSURE COMET II (WIRE) ×1 IMPLANT
HOVERMATT SINGLE USE (MISCELLANEOUS) ×1 IMPLANT
INQWIRE 1.5J .035X260CM (WIRE) ×2
KIT ENCORE 26 ADVANTAGE (KITS) ×1 IMPLANT
KIT HEART LEFT (KITS) ×2 IMPLANT
PACK CARDIAC CATHETERIZATION (CUSTOM PROCEDURE TRAY) ×2 IMPLANT
SHEATH PROBE COVER 6X72 (BAG) ×1 IMPLANT
TRANSDUCER W/STOPCOCK (MISCELLANEOUS) ×2 IMPLANT
TUBING CIL FLEX 10 FLL-RA (TUBING) ×2 IMPLANT

## 2019-01-14 NOTE — Consult Note (Addendum)
Cardiology Consultation:   Patient ID: ASENCION GUISINGER MRN: [id number]; DOB: 02-Jan-1951  Admit date: 01/13/2019 Date of Consult: 01/14/2019  Primary Care Provider: Elby Showers, MD Primary Cardiologist: No primary care provider on file.  Primary Electrophysiologist:  None    Patient Profile:   NAKEETA SEBASTIANI is a 68 y.o. female with a hx of anemia, PE on Xarelto, GERD, HTN, borderline diabetic who is being seen today for the evaluation of chest pain at the request of Dr. Alfredia Ferguson.  History of Present Illness:   Ms. Scinto is a 68 yo female with PMH noted above. She was seen by cardiology and Dr. [name] back in 2018 when she presented with dyspnea. Found to have multiple PEs in the right lung and placed on Xarelto. She has been followed by her PCP since that time. She is a retired Psychologist, counselling. Has been in her USOH until yesterday morning. States she was at the early service at church when she developed a sudden onset of chest pain at 9am. Felt short of breath as well. Symptoms continued through the morning into the afternoon. When symptoms did not resolve she presented to the ED.   In the ED her labs showed stable K+6.2 with recheck of 4.1, troponin negx3, Hgb 12.7, Ddimer 0.69. EKG was reviewed by STEMI MD but felt not to meet criteria. Given 3 SL nitro with improvement in symptoms, and was placed on nitro gtt. She was admitted to IM for further work up. Echo done showed normal EF with nor WMA.   Past Medical History:  Diagnosis Date  . Anemia    none recently  . Anxiety   . Arthritis   . Asthma   . Complete tear of right rotator cuff 12/05/2014  . Depression   . Diabetes mellitus    not on meds.denies a1c 5.8  . Diverticulitis   . Diverticulosis   . Family history of adverse reaction to anesthesia    mom  nausea and vomiting  . GERD (gastroesophageal reflux disease)   . Hypertension   . Hypothyroidism   . Migraine   . Personal history of kidney stones   . Pneumonia    hx  . Pulmonary embolism ([place])    a. diagnosed in 01/2017 w/ imaging showing multiple right lung PE with right heart strain. Started on Xarelto.   . Shortness of breath dyspnea    exersion  . Thyroid disease    hypo  . Wears glasses     Past Surgical History:  Procedure Laterality Date  . ABDOMINAL HYSTERECTOMY    . CATARACT EXTRACTION, BILATERAL    . CESAREAN SECTION    . COLONOSCOPY     multiple   . ESOPHAGOGASTRODUODENOSCOPY ENDOSCOPY     multiple  . KNEE CARTILAGE SURGERY     Left  . LAPAROSCOPIC PARTIAL COLECTOMY N/A 04/22/2013   Procedure: LAPAROSCOPIC PARTIAL COLECTOMY;  Surgeon: Odis Hollingshead, MD;  Location: [facility];  Service: General;  Laterality: N/A;  . right foot surgery  little toe and next toe   x 3  . SHOULDER ARTHROSCOPY WITH ROTATOR CUFF REPAIR AND SUBACROMIAL DECOMPRESSION Right 12/05/2014   Procedure: RIGHT SHOULDER ARTHROSCOPY,ACROMOPLASTY, ROTATOR CUFF REPAIR;  Surgeon: [name], MD;  Location: [facility];  Service: Orthopedics;  Laterality: Right;     Home Medications:  Prior to Admission medications   Medication Sig Start Date End Date Taking? Authorizing Provider  ALPRAZolam (XANAX) 0.5 MG tablet TAKE 1 TABLET BY MOUTH  TWICE A DAY AS NEEDED Patient taking differently: Take 0.5 mg by mouth 2 (two) times daily as needed for anxiety.  10/04/18  Yes Baxley, Cresenciano Lick, MD  cetirizine (ZYRTEC) 10 MG tablet Take 10 mg by mouth at bedtime.    Yes [provider]  Cholecalciferol (VITAMIN D3) 5000 units CAPS Take 5,000 Units by mouth daily.   Yes [provider]  clobetasol ointment (TEMOVATE) 7.67 % Apply 1 application topically 2 (two) times daily as needed (skin irritation).   Yes [provider]  clotrimazole (MYCELEX) 10 MG troche Take 10 mg by mouth 3 (three) times daily as needed (candidiasis).  07/28/17  Yes [provider]  EPINEPHRINE 0.3 mg/0.3 mL IJ SOAJ injection INJECT 0.3 MLS INTO THE MUSCLE  ONCE. Patient taking differently: Inject 0.3 mg into the muscle daily as needed for anaphylaxis.  05/19/15  Yes Baxley, Cresenciano Lick, MD  escitalopram (LEXAPRO) 10 MG tablet TAKE 1 TABLET (10 MG TOTAL) DAILY BY MOUTH. 12/25/18  Yes Baxley, Cresenciano Lick, MD  esomeprazole (NEXIUM) 40 MG capsule TAKE 1 CAPSULE BY MOUTH TWICE DAILY Patient taking differently: Take 40 mg by mouth daily.  11/20/15  Yes Baxley, Cresenciano Lick, MD  famotidine (PEPCID) 10 MG tablet Take 10 mg by mouth at bedtime.   Yes [provider]  furosemide (LASIX) 20 MG tablet TAKE 1 TABLET BY MOUTH EVERY DAY Patient taking differently: Take 20 mg by mouth daily.  03/16/18  Yes Baxley, Cresenciano Lick, MD  levothyroxine (SYNTHROID, LEVOTHROID) 200 MCG tablet TAKE 1 TABLET BY MOUTH EVERY DAY ON EMPTY STOMACH BEFORE BREAKFAST Patient taking differently: Take 200 mcg by mouth daily before breakfast.  03/16/18  Yes Baxley, Cresenciano Lick, MD  metoprolol tartrate (LOPRESSOR) 25 MG tablet Take 1 tablet (25 mg total) by mouth every morning. 01/03/19  Yes Baxley, Cresenciano Lick, MD  montelukast (SINGULAIR) 10 MG tablet Take 1 tablet (10 mg total) by mouth at bedtime. 01/19/18  Yes Baxley, Cresenciano Lick, MD  olmesartan (BENICAR) 40 MG tablet TAKE 1 TABLET BY MOUTH DAILY Patient taking differently: Take 40 mg by mouth daily.  09/14/18  Yes Baxley, Cresenciano Lick, MD  OSCIMIN SR 0.375 MG 12 hr tablet Take 0.357 mg by mouth daily. 05/24/16  Yes [provider]  traZODone (DESYREL) 150 MG tablet Take 1 tablet (150 mg total) by mouth at bedtime. 01/03/19  Yes Baxley, Cresenciano Lick, MD  VENTOLIN HFA 108 (90 Base) MCG/ACT inhaler USE 2 PUFFS EVERY 6 HOURS AS NEEDED FOR WHEEZING Patient taking differently: Inhale 1-2 puffs into the lungs every 6 (six) hours as needed for wheezing.  07/05/18  Yes Baxley, Cresenciano Lick, MD  XARELTO 20 MG TABS tablet TAKE 1 TABLET (20 MG TOTAL) BY MOUTH DAILY WITH SUPPER. Patient taking differently: Take 20 mg by mouth daily.  04/11/18  Yes Belva Crome, MD  zolpidem (AMBIEN) 10 MG  tablet Take 1 tablet (10 mg total) by mouth at bedtime as needed. 01/14/19   Elby Showers, MD    Inpatient Medications: Scheduled Meds: . escitalopram  10 mg Oral Daily  . famotidine  10 mg Oral QHS  . hyoscyamine  0.375 mg Oral Daily  . insulin aspart  0-5 Units Subcutaneous QHS  . insulin aspart  0-9 Units Subcutaneous TID WC  . levothyroxine  200 mcg Oral QAC breakfast  . metoprolol tartrate  25 mg Oral q morning - 10a  . montelukast  10 mg Oral QHS  . pantoprazole  40 mg  Oral Daily  . rivaroxaban  20 mg Oral Q supper  . sodium chloride flush  3 mL Intravenous Once  . traZODone  150 mg Oral QHS   Continuous Infusions: . nitroGLYCERIN 20 mcg/min (01/14/19 0422)   PRN Meds: acetaminophen, albuterol, ALPRAZolam, loratadine, ondansetron (ZOFRAN) IV, zolpidem  Allergies:    Allergies  Allergen Reactions  . Aspirin Shortness Of Breath  . Lisinopril Anaphylaxis and Other (See Comments)    Possibly Lisinopril or Levaquin, pt was taking both at the time of reaction  . Bee Venom Swelling  . Morphine And Related Itching and Other (See Comments)    Needs benadryl prior to     Social History:   Social History   Socioeconomic History  . Marital status: Single    Spouse name: Not on file  . Number of children: Not on file  . Years of education: Not on file  . Highest education level: Not on file  Occupational History  . Not on file  Social Needs  . Financial resource strain: Not on file  . Food insecurity:    Worry: Not on file    Inability: Not on file  . Transportation needs:    Medical: Not on file    Non-medical: Not on file  Tobacco Use  . Smoking status: Former Smoker    Packs/day: 0.50    Years: 6.00    Pack years: 3.00    Types: Cigarettes    Last attempt to quit: 11/14/1982    Years since quitting: 36.1  . Smokeless tobacco: Never Used  . Tobacco comment: quit smoking 30 years ago  Substance and Sexual Activity  . Alcohol use: No  . Drug use: No  . Sexual  activity: Not on file  Lifestyle  . Physical activity:    Days per week: Not on file    Minutes per session: Not on file  . Stress: Not on file  Relationships  . Social connections:    Talks on phone: Not on file    Gets together: Not on file    Attends religious service: Not on file    Active member of club or organization: Not on file    Attends meetings of clubs or organizations: Not on file    Relationship status: Not on file  . Intimate partner violence:    Fear of current or ex partner: Not on file    Emotionally abused: Not on file    Physically abused: Not on file    Forced sexual activity: Not on file  Other Topics Concern  . Not on file  Social History Narrative  . Not on file    Family History:    Family History  Problem Relation Age of Onset  . Heart disease Mother   . Kidney failure Father   . Diabetes Father   . Breast cancer Paternal Aunt   . Colon cancer Neg Hx      ROS:  Please see the history of present illness.   All other ROS reviewed and negative.     Physical Exam/Data:   Vitals:   01/14/19 0215 01/14/19 0300 01/14/19 0723 01/14/19 0824  BP: (!) 166/60  (!) 120/58 (!) 111/58  Pulse: 85  (!) 102 95  Resp: 15 17 (!) 24 20  Temp:  98.7 F (37.1 C)    TempSrc:  Oral    SpO2: 94% 96% 90% 92%  Weight:  (!) 138.8 kg    Height:  5\' 5"  (1.651  m)      Intake/Output Summary (Last 24 hours) at 01/14/2019 1241 Last data filed at 01/14/2019 0600 Gross per 24 hour  Intake 15.1 ml  Output -  Net 15.1 ml   Last 3 Weights 01/14/2019 11/27/2018 10/22/2018  Weight (lbs) 306 lb 309 lb 9.6 oz 309 lb 12.8 oz  Weight (kg) 138.801 kg 140.434 kg 140.524 kg     Body mass index is 50.92 kg/m.  General:  Well nourished, obese WF, in no acute distress HEENT: normal Lymph: no adenopathy Neck: no JVD Endocrine:  No thryomegaly Vascular: No carotid bruits; FA pulses 2+ bilaterally without bruits  Cardiac:  normal S1, S2; RRR; no murmur  Lungs:  clear to  auscultation bilaterally, no wheezing, rhonchi or rales  Abd: soft, nontender, no hepatomegaly  Ext: no edema Musculoskeletal:  No deformities, BUE and BLE strength normal and equal Skin: warm and dry  Neuro:  CNs 2-12 intact, no focal abnormalities noted Psych:  Normal affect   EKG:  The EKG was personally reviewed and demonstrates:  SR with nonspecific ST changes   Relevant CV Studies:  TTE:01/14/2019  IMPRESSIONS    1. The left ventricle has normal systolic function with an ejection fraction of 60-65%. The cavity size was normal. Left ventricular diastolic parameters were normal.  2. The right ventricle has mildly reduced systolic function. The cavity was moderately enlarged. There is no increase in right ventricular wall thickness. Right ventricular systolic pressure is severely elevated with an estimated pressure of 71.7 mmHg.  3. The mitral valve is normal in structure. There is mild mitral annular calcification present.  4. The tricuspid valve is normal in structure.  5. The aortic valve is tricuspid.  6. The pulmonic valve was normal in structure.  7. The inferior vena cava was dilated in size with >50% respiratory variability.  8. The interatrial septum appears to be lipomatous.  Laboratory Data:  Chemistry Recent Labs  Lab 01/13/19 2254 01/13/19 2358 01/14/19 0420  NA 131*  --  137  K 6.2* 4.1 4.2  CL 98  --  103  CO2 22  --  22  GLUCOSE 129*  --  119*  BUN 14  --  15  CREATININE 0.85  --  0.99  CALCIUM 8.7*  --  8.9  GFRNONAA >60  --  59*  GFRAA >60  --  >60  ANIONGAP 11  --  12    Recent Labs  Lab 01/13/19 2254 01/14/19 0420  PROT 7.2 7.2  ALBUMIN 3.5 3.6  AST 47* 26  ALT 28 15  ALKPHOS 70 71  BILITOT 1.4* 1.2   Hematology Recent Labs  Lab 01/13/19 2254 01/14/19 0420  WBC 15.2* 16.8*  RBC 5.07 4.76  HGB 13.5 12.7  HCT 43.7 41.3  MCV 86.2 86.8  MCH 26.6 26.7  MCHC 30.9 30.8  RDW 14.5 14.3  PLT 256 233   Cardiac Enzymes Recent Labs    Lab 01/14/19 0420 01/14/19 0748  TROPONINI <0.03 <0.03    Recent Labs  Lab 01/13/19 2302  TROPIPOC 0.01    BNPNo results for input(s): BNP, PROBNP in the last 168 hours.  DDimer  Recent Labs  Lab 01/14/19 0748  DDIMER 0.69*    Radiology/Studies:  Dg Chest Port 1 View  Result Date: 01/13/2019 CLINICAL DATA:  Left-sided chest pain.  Shortness of breath. EXAM: PORTABLE CHEST 1 VIEW COMPARISON:  December 27, 2016 FINDINGS: The heart size is borderline but stable. The hila and mediastinum are  unremarkable. No pulmonary nodules or masses. No focal infiltrates. No overt edema. IMPRESSION: No active disease. Electronically Signed   By: Dorise Bullion III M.D   On: 01/13/2019 22:52   US Abdomen Limited Ruq  Result Date: 01/14/2019 CLINICAL DATA:  Abdominal pain.  Elevated bilirubin. EXAM: ULTRASOUND ABDOMEN LIMITED RIGHT UPPER QUADRANT COMPARISON:  CT 01/07/2013 FINDINGS: Gallbladder: Physiologically distended with multiple shadowing gallstones. No gallbladder wall thickening or pericholecystic fluid. No sonographic Murphy sign noted by sonographer. Common bile duct: Diameter: 3 mm. Liver: Liver parenchyma is difficult to penetrate due to habitus. Heterogeneous parenchyma. No discrete focal lesion, however evaluation is limited. Portal vein is patent on color Doppler imaging with normal direction of blood flow towards the liver. IMPRESSION: 1. Gallstones without sonographic findings of acute cholecystitis or biliary dilatation. 2. Liver parenchyma is difficult to penetrate due to habitus. Electronically Signed   By: Keith Rake M.D.   On: 01/14/2019 03:20    Assessment and Plan:   ROYCE SCIARA is a 68 y.o. female with a hx of anemia, PE on Xarelto, GERd, HTN, borderline diabetic who is being seen today for the evaluation of chest pain at the request of Dr. Alfredia Ferguson.  1. Unstable Angina: symptoms started at 9am and persisted until she received nitro in the ED. Was placed on nitro gtt  and symptoms have not returned. Trop neg x3. EKG with nonspecific ST changes. RFs of HTN, and borderline DM. Lexiscan would not be beneficial given body habitus. Given pain was responsive to nitro, ACS is possible -- plan for cardiac cath -- The patient understands that risks included but are not limited to stroke (1 in 1000), death (1 in 1000), kidney failure [usually temporary] (1 in 500), bleeding (1 in 200), allergic reaction [possibly serious] (1 in 200).  -- continue IV nitro ** Of note she was given 324 ASA and shortly afterwards developed shortness of breath and dropped sats into the 80s. She felt this was related to the ASA and now listed as an allergy.   2. PE on Xarelto: last dose was 2/29 pm. Will hold dose today.  3. HTN: hypertensive on arrival, now improved.   4. DM: Hgb A1c 6.1 this admission. PCP has been following, no home medications at this time.  Transient hyperkalemia now resolved.  We will start statin pending cath results   Signed, Reino Bellis, NP  01/14/2019 12:41 PM   \\ATTENDING ATTESTATION  I have seen, examined and evaluated the patient this afternoon along with Reino Bellis, NP-C.  After reviewing all the available data and chart, we discussed the patients laboratory, study & physical findings as well as symptoms in detail. I agree with her findings, examination as well as impression recommendations as per our discussion.    Attending adjustments noted in italics.   68 year old morbidly obese woman with hypertension and diabetes with history of PE (on Xarelto, but has not taken for over 48 hrs.) who presented with prolonged episodes of left-sided substernal chest pain lasting most of the day yesterday off and on.  Finally relieved with 3 sublingual nitroglycerin and then nitroglycerin infusion.  Currently chest pain-free, but has a significant headache from nitroglycerin.  Symptoms are very concerning for progressive angina given her risk factors and  high likelihood of a false abnormal nuclear stress test, I failed.  The best way to evaluate her is with invasive cardiac catheterization to avoid extra contrast load of CT angiogram first.  She does have a history of an abnormal  reaction supposedly to aspirin.  If she does need PCI, would need to be treated with Brilinta alone.  Plan cardiac cath today, likely diagnosis unstable angina.  Further recommendations post-cath  Glenetta Hew, M.D., M.S. Interventional Cardiologist   Pager # [phone] Phone # [phone] [address] [place], [place] [address]

## 2019-01-14 NOTE — Progress Notes (Signed)
The patient was admitted early this morning after midnight and H&P has been reviewed and I am current agreement with assessment and plan done by Dr. Shela Leff.  No changes the plan of care been made accordingly.  The patient is a super morbidly obese 68 year old Caucasian female with past medical history significant for asthma, depression anxiety, diabetes mellitus, history of diverticulitis and diverticulosis, GERD, hypertension, hypothyroidism, migraine, history of PE, and other comorbidities who presented to the emergency room with a chief complaint of chest pain.    Patient states the chest pain started around 9 AM when she was at church and started experiencing sharp left-sided chest pain even at rest.  She described as an aching and radiating to her back underneath her breasts on both sides.  Diaphoresis with dyspnea.  She came home when she vomited the coffee after she felt a burning sensation in her throat and chest pain persisted.  She received 3 sublingual nitroglycerin doses in the ED and improved her chest pain but the chest pain returned and was a 6 out of 10 in intensity.  She was placed on a nitroglycerin drip which decreased the intensity to a 1 out of 10 in severity.    She was admitted for atypical chest pain was placed on a nitroglycerin drip.  She does have significant risk factors for CAD including hypertension, hyperlipidemia, type 2 diabetes mellitus, morbid obesity, and family history.  EKG done with nonspecific ST changes in the ED provider discussed with Dr. [place] who did not feel this was a STEMI.  She was given aspirin 324 mg but then subsequently became hypoxic and was then given albuterol treatment.  Aspirin was listed in allergy for her.  Patient refused IV morphine.  We will obtain an Echocardiogram as well as a Cardiology evaluation for further evaluation recommendations.  First 2 troponins are negative and d-dimer was slightly elevated at 0.69 but patient is  anticoagulated. On admission she had a leukocytosis of 15.2 which subsequently worsened to 16.8.  Chest x-ray did not show any evidence of pneumonia we will also obtain a urinalysis and UDS.  Patient's clinical response to intervention and follow-up on Cardiology recommendations after they have evaluated. We will repeat Blood work in AM.

## 2019-01-14 NOTE — Progress Notes (Signed)
Pt reported feeling short of breath.  Lungs to auscultation diminished bilaterally.  Sat 84% on room air, 97 after 2L [place] applied.  Pt states she has had this happened after taking aspirin in the past and improved with her albuterol inhaler.  Pt given albuterol nebulizer per orders with improvement. EKG obtained with no acute changes noted.  ASA allergy documented in chart.  Pt also on xarelto for hx PE and states she has been compliant with doses.  MD notified with orders to check d dimer received.  Pt currently resting with eyes closed.  Will continue to monitor pt closely.

## 2019-01-14 NOTE — ED Notes (Signed)
Patient not in room

## 2019-01-14 NOTE — H&P (Signed)
History and Physical    [name] YIR:[id number] DOB: 15-Jan-1951 DOA: 01/13/2019  PCP: Elby Showers, MD Patient coming from: Home  Chief Complaint: Chest pain  HPI: [name] is a 68 y.o. female with medical history significant of hypertension, hypothyroidism, type 2 diabetes, asthma, depression, anxiety, PE 01/2017 on Xarelto presenting to the hospital for evaluation of chest pain. Patient states around 9 AM yesterday morning she was at church and started experiencing left-sided chest pain at rest.  She describes it as aching and radiating to her back and underneath her breasts on both sides.  No associated nausea, diaphoresis, or dyspnea.  After returning home from church, she vomited the coffee she had earlier and felt burning sensation in her throat.  Her chest pain has persisted since morning.  States her chest pain improved significantly after receiving 3 doses of sublingual nitroglycerin in the ED but has now returned and is currently 6 out of 10 in intensity.  Denies having any abdominal pain.  Review of Systems: As per HPI otherwise 10 point review of systems negative.  Past Medical History:  Diagnosis Date  . Anemia    none recently  . Anxiety   . Arthritis   . Asthma   . Complete tear of right rotator cuff 12/05/2014  . Depression   . Diabetes mellitus    not on meds.denies a1c 5.8  . Diverticulitis   . Diverticulosis   . Family history of adverse reaction to anesthesia    mom  nausea and vomiting  . GERD (gastroesophageal reflux disease)   . Hypertension   . Hypothyroidism   . Migraine   . Personal history of kidney stones   . Pneumonia    hx  . Pulmonary embolism ([place])    a. diagnosed in 01/2017 w/ imaging showing multiple right lung PE with right heart strain. Started on Xarelto.   . Shortness of breath dyspnea    exersion  . Thyroid disease    hypo  . Wears glasses     Past Surgical History:  Procedure Laterality Date  . ABDOMINAL  HYSTERECTOMY    . CATARACT EXTRACTION, BILATERAL    . CESAREAN SECTION    . COLONOSCOPY     multiple   . ESOPHAGOGASTRODUODENOSCOPY ENDOSCOPY     multiple  . KNEE CARTILAGE SURGERY     Left  . LAPAROSCOPIC PARTIAL COLECTOMY N/A 04/22/2013   Procedure: LAPAROSCOPIC PARTIAL COLECTOMY;  Surgeon: Odis Hollingshead, MD;  Location: [facility];  Service: General;  Laterality: N/A;  . right foot surgery  little toe and next toe   x 3  . SHOULDER ARTHROSCOPY WITH ROTATOR CUFF REPAIR AND SUBACROMIAL DECOMPRESSION Right 12/05/2014   Procedure: RIGHT SHOULDER ARTHROSCOPY,ACROMOPLASTY, ROTATOR CUFF REPAIR;  Surgeon: [name], MD;  Location: [facility];  Service: Orthopedics;  Laterality: Right;     reports that she quit smoking about 36 years ago. Her smoking use included cigarettes. She has a 3.00 pack-year smoking history. She has never used smokeless tobacco. She reports that she does not drink alcohol or use drugs.  Allergies  Allergen Reactions  . Lisinopril Anaphylaxis and Other (See Comments)    Possibly Lisinopril or Levaquin, pt was taking both at the time of reaction  . Bee Venom Swelling  . Morphine And Related Itching and Other (See Comments)    Needs benadryl prior to     Family History  Problem Relation Age of Onset  . Heart disease Mother   .  Kidney failure Father   . Diabetes Father   . Breast cancer Paternal Aunt   . Colon cancer Neg Hx     Prior to Admission medications   Medication Sig Start Date End Date Taking? Authorizing Provider  ALPRAZolam (XANAX) 0.5 MG tablet TAKE 1 TABLET BY MOUTH TWICE A DAY AS NEEDED Patient taking differently: Take 0.5 mg by mouth 2 (two) times daily as needed for anxiety.  10/04/18  Yes Baxley, Cresenciano Lick, MD  cetirizine (ZYRTEC) 10 MG tablet Take 10 mg by mouth at bedtime.    Yes [provider]  Cholecalciferol (VITAMIN D3) 5000 units CAPS Take 5,000 Units by mouth daily.   Yes [provider]  clobetasol  ointment (TEMOVATE) 5.42 % Apply 1 application topically 2 (two) times daily as needed (skin irritation).   Yes [provider]  clotrimazole (MYCELEX) 10 MG troche Take 10 mg by mouth 3 (three) times daily as needed (candidiasis).  07/28/17  Yes [provider]  EPINEPHRINE 0.3 mg/0.3 mL IJ SOAJ injection INJECT 0.3 MLS INTO THE MUSCLE ONCE. Patient taking differently: Inject 0.3 mg into the muscle daily as needed for anaphylaxis.  05/19/15  Yes Baxley, Cresenciano Lick, MD  escitalopram (LEXAPRO) 10 MG tablet TAKE 1 TABLET (10 MG TOTAL) DAILY BY MOUTH. 12/25/18  Yes Baxley, Cresenciano Lick, MD  esomeprazole (NEXIUM) 40 MG capsule TAKE 1 CAPSULE BY MOUTH TWICE DAILY Patient taking differently: Take 40 mg by mouth daily.  11/20/15  Yes Baxley, Cresenciano Lick, MD  famotidine (PEPCID) 10 MG tablet Take 10 mg by mouth at bedtime.   Yes [provider]  furosemide (LASIX) 20 MG tablet TAKE 1 TABLET BY MOUTH EVERY DAY Patient taking differently: Take 20 mg by mouth daily.  03/16/18  Yes Baxley, Cresenciano Lick, MD  levothyroxine (SYNTHROID, LEVOTHROID) 200 MCG tablet TAKE 1 TABLET BY MOUTH EVERY DAY ON EMPTY STOMACH BEFORE BREAKFAST Patient taking differently: Take 200 mcg by mouth daily before breakfast.  03/16/18  Yes Baxley, Cresenciano Lick, MD  metoprolol tartrate (LOPRESSOR) 25 MG tablet Take 1 tablet (25 mg total) by mouth every morning. 01/03/19  Yes Baxley, Cresenciano Lick, MD  montelukast (SINGULAIR) 10 MG tablet Take 1 tablet (10 mg total) by mouth at bedtime. 01/19/18  Yes Baxley, Cresenciano Lick, MD  olmesartan (BENICAR) 40 MG tablet TAKE 1 TABLET BY MOUTH DAILY Patient taking differently: Take 40 mg by mouth daily.  09/14/18  Yes Baxley, Cresenciano Lick, MD  OSCIMIN SR 0.375 MG 12 hr tablet Take 0.357 mg by mouth daily. 05/24/16  Yes [provider]  traZODone (DESYREL) 150 MG tablet Take 1 tablet (150 mg total) by mouth at bedtime. 01/03/19  Yes Baxley, Cresenciano Lick, MD  VENTOLIN HFA 108 (90 Base) MCG/ACT inhaler USE 2 PUFFS EVERY 6 HOURS AS  NEEDED FOR WHEEZING Patient taking differently: Inhale 1-2 puffs into the lungs every 6 (six) hours as needed for wheezing.  07/05/18  Yes Baxley, Cresenciano Lick, MD  XARELTO 20 MG TABS tablet TAKE 1 TABLET (20 MG TOTAL) BY MOUTH DAILY WITH SUPPER. Patient taking differently: Take 20 mg by mouth daily.  04/11/18  Yes Belva Crome, MD  zolpidem (AMBIEN) 10 MG tablet TAKE 1 TABLET BY MOUTH AT BEDTIME AS NEEDED Patient taking differently: Take 10 mg by mouth at bedtime as needed for sleep.  10/04/18  Yes Elby Showers, MD    Physical Exam: Vitals:   01/14/19 0145 01/14/19 0200 01/14/19 0215 01/14/19 0300  BP: (!) 142/59 Marland Kitchen)  154/72 (!) 166/60   Pulse: 81 83 85   Resp: 17 19 15 17   Temp:    98.7 F (37.1 C)  TempSrc:    Oral  SpO2: 95% 98% 94% 96%  Weight:    (!) 138.8 kg  Height:    5\' 5"  (1.651 m)    Physical Exam  Constitutional: She is oriented to person, place, and time. She appears well-developed and well-nourished. No distress.  HENT:  Head: Normocephalic.  Mouth/Throat: Oropharynx is clear and moist.  Eyes: Right eye exhibits no discharge. Left eye exhibits no discharge.  Neck: Neck supple.  Cardiovascular: Normal rate, regular rhythm and intact distal pulses.  Pulmonary/Chest: Effort normal and breath sounds normal. No respiratory distress. She has no wheezes. She has no rales.  Abdominal: Soft. Bowel sounds are normal. She exhibits no distension. There is no abdominal tenderness. There is no guarding.  Musculoskeletal:        General: No edema.  Neurological: She is alert and oriented to person, place, and time.  Skin: Skin is warm and dry. She is not diaphoretic.   Labs on Admission: I have personally reviewed following labs and imaging studies  CBC: Recent Labs  Lab 01/13/19 2254  WBC 15.2*  HGB 13.5  HCT 43.7  MCV 86.2  PLT 992   Basic Metabolic Panel: Recent Labs  Lab 01/13/19 2254 01/13/19 2358  NA 131*  --   K 6.2* 4.1  CL 98  --   CO2 22  --   GLUCOSE  129*  --   BUN 14  --   CREATININE 0.85  --   CALCIUM 8.7*  --    GFR: Estimated Creatinine Clearance: 92.2 mL/min (by C-G formula based on SCr of 0.85 mg/dL). Liver Function Tests: Recent Labs  Lab 01/13/19 2254  AST 47*  ALT 28  ALKPHOS 70  BILITOT 1.4*  PROT 7.2  ALBUMIN 3.5   Recent Labs  Lab 01/13/19 2254  LIPASE 21   No results for input(s): AMMONIA in the last 168 hours. Coagulation Profile: No results for input(s): INR, PROTIME in the last 168 hours. Cardiac Enzymes: No results for input(s): CKTOTAL, CKMB, CKMBINDEX, TROPONINI in the last 168 hours. BNP (last 3 results) No results for input(s): PROBNP in the last 8760 hours. HbA1C: No results for input(s): HGBA1C in the last 72 hours. CBG: No results for input(s): GLUCAP in the last 168 hours. Lipid Profile: No results for input(s): CHOL, HDL, LDLCALC, TRIG, CHOLHDL, LDLDIRECT in the last 72 hours. Thyroid Function Tests: No results for input(s): TSH, T4TOTAL, FREET4, T3FREE, THYROIDAB in the last 72 hours. Anemia Panel: No results for input(s): VITAMINB12, FOLATE, FERRITIN, TIBC, IRON, RETICCTPCT in the last 72 hours. Urine analysis:    Component Value Date/Time   COLORURINE YELLOW 02/14/2013 2027   APPEARANCEUR CLEAR 02/14/2013 2027   LABSPEC 1.028 02/14/2013 2027   PHURINE 5.5 02/14/2013 2027   GLUCOSEU NEGATIVE 02/14/2013 2027   HGBUR NEGATIVE 02/14/2013 2027   BILIRUBINUR NEG 09/13/2018 1134   KETONESUR negative 05/05/2015 [place] 02/14/2013 2027   PROTEINUR Positive (A) 09/13/2018 1134   PROTEINUR NEGATIVE 02/14/2013 2027   UROBILINOGEN 0.2 09/13/2018 1134   UROBILINOGEN 0.2 02/14/2013 2027   NITRITE NEG 09/13/2018 1134   NITRITE NEGATIVE 02/14/2013 2027   LEUKOCYTESUR Negative 09/13/2018 1134    Radiological Exams on Admission: Dg Chest Port 1 View  Result Date: 01/13/2019 CLINICAL DATA:  Left-sided chest pain.  Shortness of breath. EXAM: PORTABLE  CHEST 1 VIEW COMPARISON:   December 27, 2016 FINDINGS: The heart size is borderline but stable. The hila and mediastinum are unremarkable. No pulmonary nodules or masses. No focal infiltrates. No overt edema. IMPRESSION: No active disease. Electronically Signed   By: Dorise Bullion III M.D   On: 01/13/2019 22:52   US Abdomen Limited Ruq  Result Date: 01/14/2019 CLINICAL DATA:  Abdominal pain.  Elevated bilirubin. EXAM: ULTRASOUND ABDOMEN LIMITED RIGHT UPPER QUADRANT COMPARISON:  CT 01/07/2013 FINDINGS: Gallbladder: Physiologically distended with multiple shadowing gallstones. No gallbladder wall thickening or pericholecystic fluid. No sonographic Murphy sign noted by sonographer. Common bile duct: Diameter: 3 mm. Liver: Liver parenchyma is difficult to penetrate due to habitus. Heterogeneous parenchyma. No discrete focal lesion, however evaluation is limited. Portal vein is patent on color Doppler imaging with normal direction of blood flow towards the liver. IMPRESSION: 1. Gallstones without sonographic findings of acute cholecystitis or biliary dilatation. 2. Liver parenchyma is difficult to penetrate due to habitus. Electronically Signed   By: Keith Rake M.D.   On: 01/14/2019 03:20    EKG: Independently reviewed.  Sinus rhythm, first-degree AV block, nonspecific ST changes.  Assessment/Plan Principal Problem:   Chest pain Active Problems:   Depression   Type 2 diabetes mellitus (HCC)   Leukocytosis   Vomiting   Atypical chest pain -Risk factors for CAD include hypertension, type 2 diabetes, morbid obesity, and family history. -Point-of-care troponin negative.  EKG with nonspecific ST changes.  ED provider discussed with cardiology, not thought to be STEMI. -PE less likely as chest pain is not pleuritic.  Patient is currently on Xarelto due to history of prior PE.  Not tachycardic or hypoxic. -Patient reports significant improvement in her chest pain after receiving 3 doses of sublingual nitroglycerin in the ED.  However, the effect lasted only briefly. -Cardiac monitoring -Aspirin 324 mg -Continue to trend troponin -Nitro drip -Patient refuses morphine. -Echocardiogram -Patient does report GERD symptoms. Will give PPI an H2 blocker.  Leukocytosis -White count 15.2.  Patient is afebrile and nontoxic-appearing.  Chest x-ray without evidence of pneumonia. -Check UA -Continue to monitor CBC  Vomiting -Patient reports one episode of vomiting yesterday morning after drinking coffee.  Reported experiencing acid reflux at that time.  Lipase normal.  T bili 1.4, remainder of LFTs grossly within normal range (except AST borderline elevated at 47).  Abdominal exam benign.  No further episodes of nausea or vomiting. -Continue to monitor LFTs.  If no improvement, consider right upper quadrant ultrasound. -Zofran PRN  -PPI and H2 blocker  First-degree AV block on EKG Patient has a history of hypothyroidism, currently on Synthroid. -Check TSH  Type 2 diabetes -Check A1c.  Sliding scale insulin and CBG checks.  Depression, anxiety  -Continue home Lexapro, Xanax, trazodone  Insomnia -Continue home Ambien as needed  Hypertension Blood pressure elevated.  A few readings as high as 208/91 recorded.  Currently 166/60. -Continue home Lopressor in the morning -Nitro drip for chest pain  History of PE -Continue Xarelto  DVT prophylaxis: Xarelto Code Status: Full code Family Communication: Sister at bedside. Disposition Plan: Anticipate discharge in 1 to 2 days. Consults called: None Admission status: Observation   Shela Leff MD [facility] Pager [phone]  If 7PM-7AM, please contact night-coverage [email or web address] Password [email or web address]  01/14/2019, 4:12 AM

## 2019-01-14 NOTE — ED Notes (Signed)
Taken to [place].  Report given to the floor.  To take up when she gets back to the unit.

## 2019-01-14 NOTE — Progress Notes (Signed)
ANTICOAGULATION CONSULT NOTE - Initial Consult  Pharmacy Consult for Xarelto Indication: hx of PE  Allergies  Allergen Reactions  . Aspirin Shortness Of Breath  . Lisinopril Anaphylaxis and Other (See Comments)    Possibly Lisinopril or Levaquin, pt was taking both at the time of reaction  . Bee Venom Swelling  . Morphine And Related Itching and Other (See Comments)    Needs benadryl prior to     Patient Measurements: Height: 5\' 5"  (165.1 cm) Weight: (!) 306 lb (138.8 kg) IBW/kg (Calculated) : 57  Vital Signs: Temp: 98.7 F (37.1 C) (03/02 0824) Temp Source: Oral (03/02 0824) BP: 128/68 (03/02 1552) Pulse Rate: 72 (03/02 1552)  Labs: Recent Labs    01/13/19 2254 01/14/19 0420 01/14/19 0748 01/14/19 1334  HGB 13.5 12.7  --   --   HCT 43.7 41.3  --   --   PLT 256 233  --   --   CREATININE 0.85 0.99  --   --   TROPONINI  --  <0.03 <0.03 <0.03    Estimated Creatinine Clearance: 79.2 mL/min (by C-G formula based on SCr of 0.99 mg/dL).   Medical History: Past Medical History:  Diagnosis Date  . Anemia    none recently  . Anxiety   . Arthritis   . Asthma   . Complete tear of right rotator cuff 12/05/2014  . Depression   . Diabetes mellitus    not on meds.denies a1c 5.8  . Diverticulitis   . Diverticulosis   . Family history of adverse reaction to anesthesia    mom  nausea and vomiting  . GERD (gastroesophageal reflux disease)   . Hypertension   . Hypothyroidism   . Migraine   . Personal history of kidney stones   . Pneumonia    hx  . Pulmonary embolism ([place])    a. diagnosed in 01/2017 w/ imaging showing multiple right lung PE with right heart strain. Started on Xarelto.   . Shortness of breath dyspnea    exersion  . Thyroid disease    hypo  . Wears glasses     Medications:  Scheduled:  . atorvastatin  80 mg Oral q1800  . escitalopram  10 mg Oral Daily  . famotidine  10 mg Oral QHS  . heparin  5,000 Units Subcutaneous Q8H  . hyoscyamine  0.375  mg Oral Daily  . insulin aspart  0-5 Units Subcutaneous QHS  . insulin aspart  0-9 Units Subcutaneous TID WC  . levothyroxine  200 mcg Oral QAC breakfast  . metoprolol tartrate  25 mg Oral q morning - 10a  . montelukast  10 mg Oral QHS  . pantoprazole  40 mg Oral Daily  . sodium chloride flush  3 mL Intravenous Once  . sodium chloride flush  3 mL Intravenous Q12H  . traZODone  150 mg Oral QHS    Assessment: 29 yof with hx of PE (01/2017) presenting with chest. On Xarelto PTA - last dose on 2/29. Underwent cardiac cath today showing 60-70% mid LAD. Pharmacy consulted to resume Xarelto at least 12 hours after sheath pull (documented on 01/14/19 at 1550).   Hgb 12.7, plt 233. Scr 0.99. No s/sx of bleeding. Takes appropriate dose of 20 mg PTA.  Goal of Therapy:  Monitor platelets by anticoagulation protocol: Yes   Plan:  Restart Xarelto 20 mg tomorrow at 1100 with lunch time then resume at night with supper  Monitor CBC and for s/sx of bleeding  Antonietta Jewel,  PharmD, [facility] Clinical Pharmacist  Pager: [phone] Phone: [phone] 01/14/2019,4:35 PM

## 2019-01-14 NOTE — Interval H&P Note (Signed)
Cath Lab Visit (complete for each Cath Lab visit)  Clinical Evaluation Leading to the Procedure:   ACS: Yes.    Non-ACS:    Anginal Classification: CCS IV  Anti-ischemic medical therapy: No Therapy  Non-Invasive Test Results: No non-invasive testing performed  Prior CABG: No previous CABG      History and Physical Interval Note:  01/14/2019 2:32 PM  [name]  has presented today for surgery, with the diagnosis of unstable angina  The various methods of treatment have been discussed with the patient and family. After consideration of risks, benefits and other options for treatment, the patient has consented to  Procedure(s): LEFT HEART CATH AND CORONARY ANGIOGRAPHY (N/A) as a surgical intervention .  The patient's history has been reviewed, patient examined, no change in status, stable for surgery.  I have reviewed the patient's chart and labs.  Questions were answered to the patient's satisfaction.     [name]

## 2019-01-14 NOTE — CV Procedure (Signed)
   Coronary angiography via right radial with access accomplished using ultrasound guidance.  FFR and DFR both non-diagnostic (.84 and 0.91 respectively).  LAD with mid eccentric 40 to 60% mid vessel stenosis beyond the origin of the large first septal perforator.  This lesion was evaluated by FFR as noted  Coronaries otherwise normal.  LV function is normal.  EF 65%.  IV nitroglycerin discontinued.  Rest of secondary risk factor modification.  Will add high intensity statin therapy.  Resume Xarelto 12 hours post sheath removal or in a.m.

## 2019-01-14 NOTE — H&P (View-Only) (Signed)
Cardiology Consultation:   Patient ID: LILLY GASSER MRN: [id number]; DOB: Jan 16, 1951  Admit date: 01/13/2019 Date of Consult: 01/14/2019  Primary Care Provider: Elby Showers, MD Primary Cardiologist: No primary care provider on file.  Primary Electrophysiologist:  None    Patient Profile:   ELINDA BUNTEN is a 68 y.o. female with a hx of anemia, PE on Xarelto, GERD, HTN, borderline diabetic who is being seen today for the evaluation of chest pain at the request of Dr. Alfredia Ferguson.  History of Present Illness:   Ms. Peterkin is a 68 yo female with PMH noted above. She was seen by cardiology and Dr. [name] back in 2018 when she presented with dyspnea. Found to have multiple PEs in the right lung and placed on Xarelto. She has been followed by her PCP since that time. She is a retired Psychologist, counselling. Has been in her USOH until yesterday morning. States she was at the early service at church when she developed a sudden onset of chest pain at 9am. Felt short of breath as well. Symptoms continued through the morning into the afternoon. When symptoms did not resolve she presented to the ED.   In the ED her labs showed stable K+6.2 with recheck of 4.1, troponin negx3, Hgb 12.7, Ddimer 0.69. EKG was reviewed by STEMI MD but felt not to meet criteria. Given 3 SL nitro with improvement in symptoms, and was placed on nitro gtt. She was admitted to IM for further work up. Echo done showed normal EF with nor WMA.   Past Medical History:  Diagnosis Date  . Anemia    none recently  . Anxiety   . Arthritis   . Asthma   . Complete tear of right rotator cuff 12/05/2014  . Depression   . Diabetes mellitus    not on meds.denies a1c 5.8  . Diverticulitis   . Diverticulosis   . Family history of adverse reaction to anesthesia    mom  nausea and vomiting  . GERD (gastroesophageal reflux disease)   . Hypertension   . Hypothyroidism   . Migraine   . Personal history of kidney stones   . Pneumonia    hx  . Pulmonary embolism ([place])    a. diagnosed in 01/2017 w/ imaging showing multiple right lung PE with right heart strain. Started on Xarelto.   . Shortness of breath dyspnea    exersion  . Thyroid disease    hypo  . Wears glasses     Past Surgical History:  Procedure Laterality Date  . ABDOMINAL HYSTERECTOMY    . CATARACT EXTRACTION, BILATERAL    . CESAREAN SECTION    . COLONOSCOPY     multiple   . ESOPHAGOGASTRODUODENOSCOPY ENDOSCOPY     multiple  . KNEE CARTILAGE SURGERY     Left  . LAPAROSCOPIC PARTIAL COLECTOMY N/A 04/22/2013   Procedure: LAPAROSCOPIC PARTIAL COLECTOMY;  Surgeon: Odis Hollingshead, MD;  Location: [facility];  Service: General;  Laterality: N/A;  . right foot surgery  little toe and next toe   x 3  . SHOULDER ARTHROSCOPY WITH ROTATOR CUFF REPAIR AND SUBACROMIAL DECOMPRESSION Right 12/05/2014   Procedure: RIGHT SHOULDER ARTHROSCOPY,ACROMOPLASTY, ROTATOR CUFF REPAIR;  Surgeon: [name], MD;  Location: [facility];  Service: Orthopedics;  Laterality: Right;     Home Medications:  Prior to Admission medications   Medication Sig Start Date End Date Taking? Authorizing Provider  ALPRAZolam (XANAX) 0.5 MG tablet TAKE 1 TABLET BY MOUTH  TWICE A DAY AS NEEDED Patient taking differently: Take 0.5 mg by mouth 2 (two) times daily as needed for anxiety.  10/04/18  Yes Baxley, Cresenciano Lick, MD  cetirizine (ZYRTEC) 10 MG tablet Take 10 mg by mouth at bedtime.    Yes [provider]  Cholecalciferol (VITAMIN D3) 5000 units CAPS Take 5,000 Units by mouth daily.   Yes [provider]  clobetasol ointment (TEMOVATE) 8.31 % Apply 1 application topically 2 (two) times daily as needed (skin irritation).   Yes [provider]  clotrimazole (MYCELEX) 10 MG troche Take 10 mg by mouth 3 (three) times daily as needed (candidiasis).  07/28/17  Yes [provider]  EPINEPHRINE 0.3 mg/0.3 mL IJ SOAJ injection INJECT 0.3 MLS INTO THE MUSCLE  ONCE. Patient taking differently: Inject 0.3 mg into the muscle daily as needed for anaphylaxis.  05/19/15  Yes Baxley, Cresenciano Lick, MD  escitalopram (LEXAPRO) 10 MG tablet TAKE 1 TABLET (10 MG TOTAL) DAILY BY MOUTH. 12/25/18  Yes Baxley, Cresenciano Lick, MD  esomeprazole (NEXIUM) 40 MG capsule TAKE 1 CAPSULE BY MOUTH TWICE DAILY Patient taking differently: Take 40 mg by mouth daily.  11/20/15  Yes Baxley, Cresenciano Lick, MD  famotidine (PEPCID) 10 MG tablet Take 10 mg by mouth at bedtime.   Yes [provider]  furosemide (LASIX) 20 MG tablet TAKE 1 TABLET BY MOUTH EVERY DAY Patient taking differently: Take 20 mg by mouth daily.  03/16/18  Yes Baxley, Cresenciano Lick, MD  levothyroxine (SYNTHROID, LEVOTHROID) 200 MCG tablet TAKE 1 TABLET BY MOUTH EVERY DAY ON EMPTY STOMACH BEFORE BREAKFAST Patient taking differently: Take 200 mcg by mouth daily before breakfast.  03/16/18  Yes Baxley, Cresenciano Lick, MD  metoprolol tartrate (LOPRESSOR) 25 MG tablet Take 1 tablet (25 mg total) by mouth every morning. 01/03/19  Yes Baxley, Cresenciano Lick, MD  montelukast (SINGULAIR) 10 MG tablet Take 1 tablet (10 mg total) by mouth at bedtime. 01/19/18  Yes Baxley, Cresenciano Lick, MD  olmesartan (BENICAR) 40 MG tablet TAKE 1 TABLET BY MOUTH DAILY Patient taking differently: Take 40 mg by mouth daily.  09/14/18  Yes Baxley, Cresenciano Lick, MD  OSCIMIN SR 0.375 MG 12 hr tablet Take 0.357 mg by mouth daily. 05/24/16  Yes [provider]  traZODone (DESYREL) 150 MG tablet Take 1 tablet (150 mg total) by mouth at bedtime. 01/03/19  Yes Baxley, Cresenciano Lick, MD  VENTOLIN HFA 108 (90 Base) MCG/ACT inhaler USE 2 PUFFS EVERY 6 HOURS AS NEEDED FOR WHEEZING Patient taking differently: Inhale 1-2 puffs into the lungs every 6 (six) hours as needed for wheezing.  07/05/18  Yes Baxley, Cresenciano Lick, MD  XARELTO 20 MG TABS tablet TAKE 1 TABLET (20 MG TOTAL) BY MOUTH DAILY WITH SUPPER. Patient taking differently: Take 20 mg by mouth daily.  04/11/18  Yes Belva Crome, MD  zolpidem (AMBIEN) 10 MG  tablet Take 1 tablet (10 mg total) by mouth at bedtime as needed. 01/14/19   Elby Showers, MD    Inpatient Medications: Scheduled Meds: . escitalopram  10 mg Oral Daily  . famotidine  10 mg Oral QHS  . hyoscyamine  0.375 mg Oral Daily  . insulin aspart  0-5 Units Subcutaneous QHS  . insulin aspart  0-9 Units Subcutaneous TID WC  . levothyroxine  200 mcg Oral QAC breakfast  . metoprolol tartrate  25 mg Oral q morning - 10a  . montelukast  10 mg Oral QHS  . pantoprazole  40 mg  Oral Daily  . rivaroxaban  20 mg Oral Q supper  . sodium chloride flush  3 mL Intravenous Once  . traZODone  150 mg Oral QHS   Continuous Infusions: . nitroGLYCERIN 20 mcg/min (01/14/19 0422)   PRN Meds: acetaminophen, albuterol, ALPRAZolam, loratadine, ondansetron (ZOFRAN) IV, zolpidem  Allergies:    Allergies  Allergen Reactions  . Aspirin Shortness Of Breath  . Lisinopril Anaphylaxis and Other (See Comments)    Possibly Lisinopril or Levaquin, pt was taking both at the time of reaction  . Bee Venom Swelling  . Morphine And Related Itching and Other (See Comments)    Needs benadryl prior to     Social History:   Social History   Socioeconomic History  . Marital status: Single    Spouse name: Not on file  . Number of children: Not on file  . Years of education: Not on file  . Highest education level: Not on file  Occupational History  . Not on file  Social Needs  . Financial resource strain: Not on file  . Food insecurity:    Worry: Not on file    Inability: Not on file  . Transportation needs:    Medical: Not on file    Non-medical: Not on file  Tobacco Use  . Smoking status: Former Smoker    Packs/day: 0.50    Years: 6.00    Pack years: 3.00    Types: Cigarettes    Last attempt to quit: 11/14/1982    Years since quitting: 36.1  . Smokeless tobacco: Never Used  . Tobacco comment: quit smoking 30 years ago  Substance and Sexual Activity  . Alcohol use: No  . Drug use: No  . Sexual  activity: Not on file  Lifestyle  . Physical activity:    Days per week: Not on file    Minutes per session: Not on file  . Stress: Not on file  Relationships  . Social connections:    Talks on phone: Not on file    Gets together: Not on file    Attends religious service: Not on file    Active member of club or organization: Not on file    Attends meetings of clubs or organizations: Not on file    Relationship status: Not on file  . Intimate partner violence:    Fear of current or ex partner: Not on file    Emotionally abused: Not on file    Physically abused: Not on file    Forced sexual activity: Not on file  Other Topics Concern  . Not on file  Social History Narrative  . Not on file    Family History:    Family History  Problem Relation Age of Onset  . Heart disease Mother   . Kidney failure Father   . Diabetes Father   . Breast cancer Paternal Aunt   . Colon cancer Neg Hx      ROS:  Please see the history of present illness.   All other ROS reviewed and negative.     Physical Exam/Data:   Vitals:   01/14/19 0215 01/14/19 0300 01/14/19 0723 01/14/19 0824  BP: (!) 166/60  (!) 120/58 (!) 111/58  Pulse: 85  (!) 102 95  Resp: 15 17 (!) 24 20  Temp:  98.7 F (37.1 C)    TempSrc:  Oral    SpO2: 94% 96% 90% 92%  Weight:  (!) 138.8 kg    Height:  5\' 5"  (1.651  m)      Intake/Output Summary (Last 24 hours) at 01/14/2019 1241 Last data filed at 01/14/2019 0600 Gross per 24 hour  Intake 15.1 ml  Output -  Net 15.1 ml   Last 3 Weights 01/14/2019 11/27/2018 10/22/2018  Weight (lbs) 306 lb 309 lb 9.6 oz 309 lb 12.8 oz  Weight (kg) 138.801 kg 140.434 kg 140.524 kg     Body mass index is 50.92 kg/m.  General:  Well nourished, obese WF, in no acute distress HEENT: normal Lymph: no adenopathy Neck: no JVD Endocrine:  No thryomegaly Vascular: No carotid bruits; FA pulses 2+ bilaterally without bruits  Cardiac:  normal S1, S2; RRR; no murmur  Lungs:  clear to  auscultation bilaterally, no wheezing, rhonchi or rales  Abd: soft, nontender, no hepatomegaly  Ext: no edema Musculoskeletal:  No deformities, BUE and BLE strength normal and equal Skin: warm and dry  Neuro:  CNs 2-12 intact, no focal abnormalities noted Psych:  Normal affect   EKG:  The EKG was personally reviewed and demonstrates:  SR with nonspecific ST changes   Relevant CV Studies:  TTE:01/14/2019  IMPRESSIONS    1. The left ventricle has normal systolic function with an ejection fraction of 60-65%. The cavity size was normal. Left ventricular diastolic parameters were normal.  2. The right ventricle has mildly reduced systolic function. The cavity was moderately enlarged. There is no increase in right ventricular wall thickness. Right ventricular systolic pressure is severely elevated with an estimated pressure of 71.7 mmHg.  3. The mitral valve is normal in structure. There is mild mitral annular calcification present.  4. The tricuspid valve is normal in structure.  5. The aortic valve is tricuspid.  6. The pulmonic valve was normal in structure.  7. The inferior vena cava was dilated in size with >50% respiratory variability.  8. The interatrial septum appears to be lipomatous.  Laboratory Data:  Chemistry Recent Labs  Lab 01/13/19 2254 01/13/19 2358 01/14/19 0420  NA 131*  --  137  K 6.2* 4.1 4.2  CL 98  --  103  CO2 22  --  22  GLUCOSE 129*  --  119*  BUN 14  --  15  CREATININE 0.85  --  0.99  CALCIUM 8.7*  --  8.9  GFRNONAA >60  --  59*  GFRAA >60  --  >60  ANIONGAP 11  --  12    Recent Labs  Lab 01/13/19 2254 01/14/19 0420  PROT 7.2 7.2  ALBUMIN 3.5 3.6  AST 47* 26  ALT 28 15  ALKPHOS 70 71  BILITOT 1.4* 1.2   Hematology Recent Labs  Lab 01/13/19 2254 01/14/19 0420  WBC 15.2* 16.8*  RBC 5.07 4.76  HGB 13.5 12.7  HCT 43.7 41.3  MCV 86.2 86.8  MCH 26.6 26.7  MCHC 30.9 30.8  RDW 14.5 14.3  PLT 256 233   Cardiac Enzymes Recent Labs    Lab 01/14/19 0420 01/14/19 0748  TROPONINI <0.03 <0.03    Recent Labs  Lab 01/13/19 2302  TROPIPOC 0.01    BNPNo results for input(s): BNP, PROBNP in the last 168 hours.  DDimer  Recent Labs  Lab 01/14/19 0748  DDIMER 0.69*    Radiology/Studies:  Dg Chest Port 1 View  Result Date: 01/13/2019 CLINICAL DATA:  Left-sided chest pain.  Shortness of breath. EXAM: PORTABLE CHEST 1 VIEW COMPARISON:  December 27, 2016 FINDINGS: The heart size is borderline but stable. The hila and mediastinum are  unremarkable. No pulmonary nodules or masses. No focal infiltrates. No overt edema. IMPRESSION: No active disease. Electronically Signed   By: Dorise Bullion III M.D   On: 01/13/2019 22:52   US Abdomen Limited Ruq  Result Date: 01/14/2019 CLINICAL DATA:  Abdominal pain.  Elevated bilirubin. EXAM: ULTRASOUND ABDOMEN LIMITED RIGHT UPPER QUADRANT COMPARISON:  CT 01/07/2013 FINDINGS: Gallbladder: Physiologically distended with multiple shadowing gallstones. No gallbladder wall thickening or pericholecystic fluid. No sonographic Murphy sign noted by sonographer. Common bile duct: Diameter: 3 mm. Liver: Liver parenchyma is difficult to penetrate due to habitus. Heterogeneous parenchyma. No discrete focal lesion, however evaluation is limited. Portal vein is patent on color Doppler imaging with normal direction of blood flow towards the liver. IMPRESSION: 1. Gallstones without sonographic findings of acute cholecystitis or biliary dilatation. 2. Liver parenchyma is difficult to penetrate due to habitus. Electronically Signed   By: Keith Rake M.D.   On: 01/14/2019 03:20    Assessment and Plan:   NATASSIA GUTHRIDGE is a 68 y.o. female with a hx of anemia, PE on Xarelto, GERd, HTN, borderline diabetic who is being seen today for the evaluation of chest pain at the request of Dr. Alfredia Ferguson.  1. Unstable Angina: symptoms started at 9am and persisted until she received nitro in the ED. Was placed on nitro gtt  and symptoms have not returned. Trop neg x3. EKG with nonspecific ST changes. RFs of HTN, and borderline DM. Lexiscan would not be beneficial given body habitus. Given pain was responsive to nitro, ACS is possible -- plan for cardiac cath -- The patient understands that risks included but are not limited to stroke (1 in 1000), death (1 in 1000), kidney failure [usually temporary] (1 in 500), bleeding (1 in 200), allergic reaction [possibly serious] (1 in 200).  -- continue IV nitro ** Of note she was given 324 ASA and shortly afterwards developed shortness of breath and dropped sats into the 80s. She felt this was related to the ASA and now listed as an allergy.   2. PE on Xarelto: last dose was 2/29 pm. Will hold dose today.  3. HTN: hypertensive on arrival, now improved.   4. DM: Hgb A1c 6.1 this admission. PCP has been following, no home medications at this time.  Transient hyperkalemia now resolved.  We will start statin pending cath results   Signed, Reino Bellis, NP  01/14/2019 12:41 PM   \\ATTENDING ATTESTATION  I have seen, examined and evaluated the patient this afternoon along with Reino Bellis, NP-C.  After reviewing all the available data and chart, we discussed the patients laboratory, study & physical findings as well as symptoms in detail. I agree with her findings, examination as well as impression recommendations as per our discussion.    Attending adjustments noted in italics.   68 year old morbidly obese woman with hypertension and diabetes with history of PE (on Xarelto, but has not taken for over 48 hrs.) who presented with prolonged episodes of left-sided substernal chest pain lasting most of the day yesterday off and on.  Finally relieved with 3 sublingual nitroglycerin and then nitroglycerin infusion.  Currently chest pain-free, but has a significant headache from nitroglycerin.  Symptoms are very concerning for progressive angina given her risk factors and  high likelihood of a false abnormal nuclear stress test, I failed.  The best way to evaluate her is with invasive cardiac catheterization to avoid extra contrast load of CT angiogram first.  She does have a history of an abnormal  reaction supposedly to aspirin.  If she does need PCI, would need to be treated with Brilinta alone.  Plan cardiac cath today, likely diagnosis unstable angina.  Further recommendations post-cath  Glenetta Hew, M.D., M.S. Interventional Cardiologist   Pager # [phone] Phone # [phone] [address] [place], [place] [address]

## 2019-01-15 ENCOUNTER — Encounter (HOSPITAL_COMMUNITY): Payer: Self-pay | Admitting: Interventional Cardiology

## 2019-01-15 DIAGNOSIS — E1100 Type 2 diabetes mellitus with hyperosmolarity without nonketotic hyperglycemic-hyperosmolar coma (NKHHC): Secondary | ICD-10-CM | POA: Diagnosis not present

## 2019-01-15 DIAGNOSIS — I200 Unstable angina: Secondary | ICD-10-CM | POA: Diagnosis not present

## 2019-01-15 DIAGNOSIS — F329 Major depressive disorder, single episode, unspecified: Secondary | ICD-10-CM | POA: Diagnosis not present

## 2019-01-15 DIAGNOSIS — I25110 Atherosclerotic heart disease of native coronary artery with unstable angina pectoris: Secondary | ICD-10-CM | POA: Diagnosis not present

## 2019-01-15 DIAGNOSIS — I10 Essential (primary) hypertension: Secondary | ICD-10-CM

## 2019-01-15 DIAGNOSIS — R112 Nausea with vomiting, unspecified: Secondary | ICD-10-CM | POA: Diagnosis not present

## 2019-01-15 DIAGNOSIS — R079 Chest pain, unspecified: Secondary | ICD-10-CM | POA: Diagnosis not present

## 2019-01-15 DIAGNOSIS — I25118 Atherosclerotic heart disease of native coronary artery with other forms of angina pectoris: Secondary | ICD-10-CM | POA: Diagnosis not present

## 2019-01-15 DIAGNOSIS — E7849 Other hyperlipidemia: Secondary | ICD-10-CM | POA: Diagnosis not present

## 2019-01-15 DIAGNOSIS — D72829 Elevated white blood cell count, unspecified: Secondary | ICD-10-CM | POA: Diagnosis not present

## 2019-01-15 LAB — CBC WITH DIFFERENTIAL/PLATELET
Abs Immature Granulocytes: 0.04 10*3/uL (ref 0.00–0.07)
BASOS ABS: 0.1 10*3/uL (ref 0.0–0.1)
Basophils Relative: 0 %
Eosinophils Absolute: 0.3 10*3/uL (ref 0.0–0.5)
Eosinophils Relative: 2 %
HCT: 37.9 % (ref 36.0–46.0)
Hemoglobin: 11.4 g/dL — ABNORMAL LOW (ref 12.0–15.0)
IMMATURE GRANULOCYTES: 0 %
Lymphocytes Relative: 16 %
Lymphs Abs: 2.5 10*3/uL (ref 0.7–4.0)
MCH: 26.6 pg (ref 26.0–34.0)
MCHC: 30.1 g/dL (ref 30.0–36.0)
MCV: 88.3 fL (ref 80.0–100.0)
Monocytes Absolute: 2.3 10*3/uL — ABNORMAL HIGH (ref 0.1–1.0)
Monocytes Relative: 14 %
NEUTROS ABS: 10.9 10*3/uL — AB (ref 1.7–7.7)
NEUTROS PCT: 68 %
Platelets: 203 10*3/uL (ref 150–400)
RBC: 4.29 MIL/uL (ref 3.87–5.11)
RDW: 14.6 % (ref 11.5–15.5)
WBC: 16.1 10*3/uL — ABNORMAL HIGH (ref 4.0–10.5)
nRBC: 0 % (ref 0.0–0.2)

## 2019-01-15 LAB — COMPREHENSIVE METABOLIC PANEL
ALT: 13 U/L (ref 0–44)
AST: 14 U/L — AB (ref 15–41)
Albumin: 2.9 g/dL — ABNORMAL LOW (ref 3.5–5.0)
Alkaline Phosphatase: 58 U/L (ref 38–126)
Anion gap: 10 (ref 5–15)
BUN: 12 mg/dL (ref 8–23)
CALCIUM: 8.9 mg/dL (ref 8.9–10.3)
CO2: 26 mmol/L (ref 22–32)
Chloride: 101 mmol/L (ref 98–111)
Creatinine, Ser: 0.82 mg/dL (ref 0.44–1.00)
GFR calc Af Amer: >60 mL/min (ref >60)
GFR calc non Af Amer: >60 mL/min (ref >60)
Glucose, Bld: 134 mg/dL — ABNORMAL HIGH (ref 70–99)
Potassium: 3.7 mmol/L (ref 3.5–5.1)
Sodium: 137 mmol/L (ref 135–145)
Total Bilirubin: 0.7 mg/dL (ref 0.3–1.2)
Total Protein: 6.2 g/dL — ABNORMAL LOW (ref 6.5–8.1)

## 2019-01-15 LAB — GLUCOSE, CAPILLARY
GLUCOSE-CAPILLARY: 118 mg/dL — AB (ref 70–99)
Glucose-Capillary: 132 mg/dL — ABNORMAL HIGH (ref 70–99)

## 2019-01-15 LAB — PHOSPHORUS: Phosphorus: 2.7 mg/dL (ref 2.5–4.6)

## 2019-01-15 LAB — MAGNESIUM: Magnesium: 1.6 mg/dL — ABNORMAL LOW (ref 1.7–2.4)

## 2019-01-15 MED ORDER — NITROGLYCERIN 0.4 MG SL SUBL: 0.4000 mg | SUBLINGUAL_TABLET | SUBLINGUAL | 3 refills | Status: DC | PRN

## 2019-01-15 MED ORDER — ISOSORBIDE MONONITRATE ER 30 MG PO TB24: 30.0000 mg | ORAL_TABLET | Freq: Every day | ORAL | 11 refills | Status: DC

## 2019-01-15 MED ORDER — ATORVASTATIN CALCIUM 80 MG PO TABS: 80.0000 mg | ORAL_TABLET | Freq: Every day | ORAL | 0 refills | Status: DC

## 2019-01-15 NOTE — Progress Notes (Addendum)
Progress Note  Patient Name: [name] Date of Encounter: 01/15/2019  Primary Cardiologist: No primary care provider on file.   Subjective   No chest pain this morning.  Headache improving.  Inpatient Medications    Scheduled Meds: . atorvastatin  80 mg Oral q1800  . escitalopram  10 mg Oral Daily  . famotidine  10 mg Oral QHS  . hyoscyamine  0.375 mg Oral Daily  . insulin aspart  0-5 Units Subcutaneous QHS  . insulin aspart  0-9 Units Subcutaneous TID WC  . levothyroxine  200 mcg Oral QAC breakfast  . metoprolol tartrate  25 mg Oral q morning - 10a  . montelukast  10 mg Oral QHS  . pantoprazole  40 mg Oral Daily  . rivaroxaban  20 mg Oral Q supper  . sodium chloride flush  3 mL Intravenous Once  . sodium chloride flush  3 mL Intravenous Q12H  . traZODone  150 mg Oral QHS   Continuous Infusions: . sodium chloride    . nitroGLYCERIN Stopped (01/14/19 1900)   PRN Meds: sodium chloride, acetaminophen, albuterol, ALPRAZolam, loratadine, ondansetron (ZOFRAN) IV, sodium chloride flush, zolpidem   Vital Signs    Vitals:   01/14/19 1951 01/14/19 2358 01/15/19 0426 01/15/19 0810  BP: (!) 135/56 (!) 111/50 (!) 127/58 (!) 143/67  Pulse: 77 76 80 78  Resp:    18  Temp: 98.2 F (36.8 C) 99 F (37.2 C) 98.3 F (36.8 C) 98.6 F (37 C)  TempSrc: Oral Oral Oral Oral  SpO2: 96% 98% 94% 96%  Weight:   (!) 138.9 kg   Height:        Intake/Output Summary (Last 24 hours) at 01/15/2019 0915 Last data filed at 01/14/2019 2230 Gross per 24 hour  Intake 348.66 ml  Output -  Net 348.66 ml   Last 3 Weights 01/15/2019 01/14/2019 11/27/2018  Weight (lbs) 306 lb 3.2 oz 306 lb 309 lb 9.6 oz  Weight (kg) 138.891 kg 138.801 kg 140.434 kg      Telemetry    SR - Personally Reviewed  ECG    SR with nonspecific ST changes - Personally Reviewed  Physical Exam   GEN: No acute distress.   Neck: No JVD Cardiac: RRR, no murmurs, rubs, or gallops.  Respiratory: Clear to  auscultation bilaterally. GI: Soft, nontender, non-distended  MS: No edema; No deformity. Right radial site stable, mild bruising.  Neuro:  Nonfocal  Psych: Normal affect   Labs    Chemistry Recent Labs  Lab 01/13/19 2254 01/13/19 2358 01/14/19 0420 01/15/19 0355  NA 131*  --  137 137  K 6.2* 4.1 4.2 3.7  CL 98  --  103 101  CO2 22  --  22 26  GLUCOSE 129*  --  119* 134*  BUN 14  --  15 12  CREATININE 0.85  --  0.99 0.82  CALCIUM 8.7*  --  8.9 8.9  PROT 7.2  --  7.2 6.2*  ALBUMIN 3.5  --  3.6 2.9*  AST 47*  --  26 14*  ALT 28  --  15 13  ALKPHOS 70  --  71 58  BILITOT 1.4*  --  1.2 0.7  GFRNONAA >60  --  59* >60  GFRAA >60  --  >60 >60  ANIONGAP 11  --  12 10     Hematology Recent Labs  Lab 01/13/19 2254 01/14/19 0420 01/15/19 0355  WBC 15.2* 16.8* 16.1*  RBC 5.07 4.76  4.29  HGB 13.5 12.7 11.4*  HCT 43.7 41.3 37.9  MCV 86.2 86.8 88.3  MCH 26.6 26.7 26.6  MCHC 30.9 30.8 30.1  RDW 14.5 14.3 14.6  PLT 256 233 203    Cardiac Enzymes Recent Labs  Lab 01/14/19 0420 01/14/19 0748 01/14/19 1334  TROPONINI <0.03 <0.03 <0.03    Recent Labs  Lab 01/13/19 2302  TROPIPOC 0.01     BNPNo results for input(s): BNP, PROBNP in the last 168 hours.   DDimer  Recent Labs  Lab 01/14/19 0748  DDIMER 0.69*     Radiology    Dg Chest Port 1 View  Result Date: 01/13/2019 CLINICAL DATA:  Left-sided chest pain.  Shortness of breath. EXAM: PORTABLE CHEST 1 VIEW COMPARISON:  December 27, 2016 FINDINGS: The heart size is borderline but stable. The hila and mediastinum are unremarkable. No pulmonary nodules or masses. No focal infiltrates. No overt edema. IMPRESSION: No active disease. Electronically Signed   By: Dorise Bullion III M.D   On: 01/13/2019 22:52   US Abdomen Limited Ruq  Result Date: 01/14/2019 CLINICAL DATA:  Abdominal pain.  Elevated bilirubin. EXAM: ULTRASOUND ABDOMEN LIMITED RIGHT UPPER QUADRANT COMPARISON:  CT 01/07/2013 FINDINGS: Gallbladder:  Physiologically distended with multiple shadowing gallstones. No gallbladder wall thickening or pericholecystic fluid. No sonographic Murphy sign noted by sonographer. Common bile duct: Diameter: 3 mm. Liver: Liver parenchyma is difficult to penetrate due to habitus. Heterogeneous parenchyma. No discrete focal lesion, however evaluation is limited. Portal vein is patent on color Doppler imaging with normal direction of blood flow towards the liver. IMPRESSION: 1. Gallstones without sonographic findings of acute cholecystitis or biliary dilatation. 2. Liver parenchyma is difficult to penetrate due to habitus. Electronically Signed   By: Keith Rake M.D.   On: 01/14/2019 03:20    Cardiac Studies   Cath: 01/14/2019   60 to 70% mid LAD with both DFR and FFR (0.91 and 0.84 respectively) documenting absence of hemodynamic significance.  Normal left main  Normal circumflex  Normal RCA  Normal LV function and LVEDP.  EF 65%  RECOMMENDATIONS:   Possible allergy to aspirin  Aggressive risk factor modification.  Started high intensity statin therapy.  Other aggressive secondary risk modification measures will also be stressed.  Resume Xarelto greater than 12 hours after sheath pull from radial artery  Needs clinical follow-up and if has convincing symptoms of angina in the future, the LAD would be reasonably easy to treat with percutaneous therapy.  TTE: 01/14/2019  IMPRESSIONS    1. The left ventricle has normal systolic function with an ejection fraction of 60-65%. The cavity size was normal. Left ventricular diastolic parameters were normal.  2. The right ventricle has mildly reduced systolic function. The cavity was moderately enlarged. There is no increase in right ventricular wall thickness. Right ventricular systolic pressure is severely elevated with an estimated pressure of 71.7 mmHg.  3. The mitral valve is normal in structure. There is mild mitral annular calcification  present.  4. The tricuspid valve is normal in structure.  5. The aortic valve is tricuspid.  6. The pulmonic valve was normal in structure.  7. The inferior vena cava was dilated in size with >50% respiratory variability.  8. The interatrial septum appears to be lipomatous.  Patient Profile     68 y.o. female with a hx of anemia, PE on Xarelto, GERD, HTN, borderline diabetic who was for the evaluation of chest pain at the request of Dr. Alfredia Ferguson.  Assessment &  Plan    1. Unstable Angina: Underwent cardiac cath yesterday noted above with 60% mLAD lesion that was not significant via DFR. Will aggressive risk factor modification. No further chest pain. Placed on lipitor 80mg  daily post cath. Allergy to ASA. Therefore will not add.  2. PE on Xarelto: last dose was 2/29 pm.Plan to resume this evening.  3. HTN: stable   4. DM: Hgb A1c 6.1 this admission. PCP has been following, no home medications at this time.  5. HL: LDL 97 about 4 months ago. Given CAD on cath, statin started. Needs FLP/LFTs in 6 weeks.  [facility] will sign off.   Medication Recommendations:  Noted above Other recommendations (labs, testing, etc): Would discharge on Imdur 30 mg nightly Follow up as an outpatient:  3 weeks -with Dr. [name] or APP  For questions or updates, please contact [place] Please consult [email or web address] for contact info under     Signed, Reino Bellis, NP  01/15/2019, 9:15 AM     ATTENDING ATTESTATION  I have seen, examined and evaluated the patient this PM along with Reino Bellis, NP-C.  After reviewing all the available data and chart, we discussed the patients laboratory, study & physical findings as well as symptoms in detail. I agree with her findings, examination as well as impression recommendations as per our discussion.    I personally reviewed the cath films with Dr. [name] yesterday.  Moderate to severe lesion in the LAD but not physiologically significant by  DFR/FFR.  As such, we will treat medically. She is on ARB at home which needs to be restarted on discharge.  She is also on low-dose beta-blocker.  She needs to discuss with her PCP, would recommend converting metoprolol tartrate to metoprolol succinate which is a once daily medication.  Would also discharge on Imdur 30 mg nightly.  Standard radial cath restrictions.  Otherwise okay for discharge today from a cardiology standpoint.   Glenetta Hew, M.D., M.S. Interventional Cardiologist   Pager # [phone] Phone # [phone] [address] [place], [place] [address]

## 2019-01-15 NOTE — Progress Notes (Addendum)
Removed TR band from pt's right radial.Site is a level 0. Applied gauze and Tegaderm to site. Educated pt to leave in place for 24 hours. Pt verbalized understanding. Will continue to monitor.

## 2019-01-15 NOTE — Discharge Summary (Addendum)
Physician Discharge Summary  [name]:[id number] DOB: [birth year] DOA: 01/13/2019  PCP: Tammy Crome, MD  Admit date: 01/13/2019 Discharge date: 01/16/2019  Admitted From: Home Disposition: Home  Recommendations for Outpatient Follow-up:  1. Follow up with PCP in 1-2 weeks 2. Discuss with PCP about changing Beta-Blocker from Metoprolol Tartrate to Metoprolol Succinate 3. Follow up with Cardiolog 4. Please obtain CMP/CBC, Mag, Phos in one week 5. Please follow up on the following pending results:  Home Health: No Equipment/Devices: Home O2 2 Liters but patient Declined   Discharge Condition: Stable CODE STATUS: FULL CODE Diet recommendation: Heart Health Carb Modified   Brief/Interim Summary: The patient is a super morbidly obese 68 year old Caucasian female with past medical history significant for asthma, depression anxiety, diabetes mellitus, history of diverticulitis and diverticulosis, GERD, hypertension, hypothyroidism, migraine, history of PE, and other comorbidities who presented to the emergency room with a chief complaint of chest pain.    Patient states the chest pain started around 9 AM when she was at church and started experiencing sharp left-sided chest pain even at rest.  She described as an aching and radiating to her back underneath her breasts on both sides.  Diaphoresis with dyspnea.  She came home when she vomited the coffee after she felt a burning sensation in her throat and chest pain persisted.  She received 3 sublingual nitroglycerin doses in the ED and improved her chest pain but the chest pain returned and was a 6 out of 10 in intensity.  She was placed on a nitroglycerin drip which decreased the intensity to a 1 out of 10 in severity.    She was admitted for atypical chest pain was placed on a nitroglycerin drip.  She does have significant risk factors for CAD including hypertension, hyperlipidemia, type 2 diabetes mellitus, morbid obesity, and family  history.  EKG done with nonspecific ST changes in the ED provider discussed with Dr. Martinique who did not feel this was a STEMI.  She was given aspirin 324 mg but then subsequently became hypoxic and was then given albuterol treatment.  Aspirin was listed in allergy for her.  Patient refused IV morphine.  Obtained an Echocardiogram as well as a Cardiology evaluation for further evaluation recommendations.  First 2 troponins are negative and d-dimer was slightly elevated at 0.69 but patient is anticoagulated. On admission she had a leukocytosis of 15.2 which subsequently worsened to 16.8.  Chest x-ray did not show any evidence of pneumonia we will also obtain a urinalysis and UDS. Cardiology evaluated and recommended Cath and CP resolved. Cath showed 60% mid LAD lesion that was not significant via DFR and Cardiology recommended aggressive Risk factor modification and added a Statin and Imdur. She was cleared from a Cardiac Perspective.  Discharge however we obtained a home O2 screen and patient desaturated but that she refused home O2 and she is of sound mind making decision.  She is deemed medically stable and I advised her to follow-up with her PCP and cardiology within 1 week.  PCP is to follow-up and evaluate patient's leukocytosis likely secondary to chest pain.  Discharge Diagnoses:  Principal Problem:   Unstable angina (HCC) Active Problems:   Depression   Essential hypertension   Hyperlipidemia due to dietary fat intake   Type 2 diabetes mellitus (HCC)   Morbid obesity (HCC)   Abnormal EKG   Chest pain   Leukocytosis   Vomiting  Atypical chest pain/Unstable Angina -Risk factors for CAD include hypertension, type  2 diabetes, morbid obesity, and family history. -Point-of-care troponin negative.  EKG with nonspecific ST changes.  ED provider discussed with cardiology, not thought to be STEMI. -PE less likely as chest pain is not pleuritic.  Patient is currently on Xarelto due to history of prior  PE.  Not tachycardic or hypoxic. -Patient reports significant improvement in her chest pain after receiving 3 doses of sublingual nitroglycerin in the ED. However, the effect lasted only briefly. -Cardiac monitoring -Aspirin 324 mg patient is allergic to aspirin so this is listed as an allergy -Continue to trend troponin was less than 0.033 -Nitro drip discontinued -Patient refuses morphine. -Echocardiogram as below -Patient does report GERD symptoms. Will give PPI an H2 blocker. -Cardiac catheterization occurred and she had 60% mid LAD lesion with moderate severe but not physiologically significant by DFR/FFR -Cardiology agreed to treat medically and resume her arm as well as starting Imdur 30 mg daily and high-dose intensity statin atorvastatin 80 mg p.o.  Cardiology would also recommend sublingual nitroglycerin as needed -She is to follow-up with cardiology as cardiology cleared her from their perspective -She is to follow-up with PCP and discuss converting metoprolol tartrate to metoprolol succinate  Leukocytosis -White count 15.2.  Patient is afebrile and nontoxic-appearing.  Chest x-ray without evidence of pneumonia. -Check UA for done and patient not complaining of any burning or discomfort -Likely in the setting of chest pain leukocytosis elevated to 16.8 now 16.1 -Continue to monitor CBC and recommending follow-up with PCP for repeat blood work on Friday  Vomiting -Patient reports one episode of vomiting yesterday morning after drinking coffee.  Reported experiencing acid reflux at that time.  Lipase normal.  T bili 1.4, remainder of LFTs grossly within normal range (except AST borderline elevated at 47).  Abdominal exam benign.  No further episodes of nausea or vomiting. -Continue to monitor LFTs.  If no improvement, consider right upper quadrant ultrasound to improve -Zofran PRN  -PPI and H2 blocker  First-degree AV block on EKG  History of hypothyroidism, currently on  Synthroid. -Checked TSH was 1.778  Type 2 diabetes -Checked A1c and was 6.1.  Sliding scale insulin and CBG checks. -CBGs ranging from 1 22-1 32  Depression, anxiety  -Continue home Lexapro, Xanax, trazodone  Insomnia -Continue home Ambien as needed  Hypertension -Blood pressure was elevated on admission -Continue home Lopressor in the morning -Nitro drip for chest pain discontinued and patient was placed on Imdur.  Her RF was restarted  History of PE -Continue Xarelto -Need a home O2 screen on the patient and she desaturated but is refusing oxygen  Obesity hypoventilation syndrome/obstructive sleep apnea -Wears a CPAP at night -Desaturated on home O2 screen and required oxygen but patient refused -Follow-up with PCP for further evaluation  Hypomagnesemia -Patient magnesium levels 1.6-replete prior to discharge -Continue monitor replete as necessary -Follow-up magnesium level in outpatient setting  Super Morbid obesity -Estimated body mass index is 50.95 kg/m as calculated from the following:   Height as of this encounter: 5\' 5"  (1.651 m).   Weight as of this encounter: 138.9 kg. -Weight Loss and Dietary Counseling given  Discharge Instructions  Discharge Instructions    (HEART FAILURE PATIENTS) Call MD:  Anytime you have any of the following symptoms: 1) 3 pound weight gain in 24 hours or 5 pounds in 1 week 2) shortness of breath, with or without a dry hacking cough 3) swelling in the hands, feet or stomach 4) if you have to sleep on extra pillows  at night in order to breathe.   Complete by:  As directed    Call MD for:  difficulty breathing, headache or visual disturbances   Complete by:  As directed    Call MD for:  extreme fatigue   Complete by:  As directed    Call MD for:  hives   Complete by:  As directed    Call MD for:  persistant dizziness or light-headedness   Complete by:  As directed    Call MD for:  persistant nausea and vomiting   Complete by:   As directed    Call MD for:  redness, tenderness, or signs of infection (pain, swelling, redness, odor or green/yellow discharge around incision site)   Complete by:  As directed    Call MD for:  severe uncontrolled pain   Complete by:  As directed    Call MD for:  temperature >100.4   Complete by:  As directed    Diet - low sodium heart healthy   Complete by:  As directed    Discharge instructions   Complete by:  As directed    You were cared for by a hospitalist during your hospital stay. If you have any questions about your discharge medications or the care you received while you were in the hospital after you are discharged, you can call the unit and ask to speak with the hospitalist on call if the hospitalist that took care of you is not available. Once you are discharged, your primary care physician will handle any further medical issues. Please note that NO REFILLS for any discharge medications will be authorized once you are discharged, as it is imperative that you return to your primary care physician (or establish a relationship with a primary care physician if you do not have one) for your aftercare needs so that they can reassess your need for medications and monitor your lab values.  Follow up with PCP, Cardiology, and Pulmonary. Take all medications as prescribed. If symptoms change or worsen please return to the ED for evaluation   Increase activity slowly   Complete by:  As directed      Allergies as of 01/15/2019      Reactions   Aspirin Shortness Of Breath   Lisinopril Anaphylaxis, Other (See Comments)   Possibly Lisinopril or Levaquin, pt was taking both at the time of reaction   Bee Venom Swelling   Morphine And Related Itching, Other (See Comments)   Needs benadryl prior to       Medication List    TAKE these medications   ALPRAZolam 0.5 MG tablet Commonly known as:  XANAX TAKE 1 TABLET BY MOUTH TWICE A DAY AS NEEDED What changed:  reasons to take this    atorvastatin 80 MG tablet Commonly known as:  LIPITOR Take 1 tablet (80 mg total) by mouth daily at 6 PM.   cetirizine 10 MG tablet Commonly known as:  ZYRTEC Take 10 mg by mouth at bedtime.   clobetasol ointment 0.05 % Commonly known as:  TEMOVATE Apply 1 application topically 2 (two) times daily as needed (skin irritation).   clotrimazole 10 MG troche Commonly known as:  MYCELEX Take 10 mg by mouth 3 (three) times daily as needed (candidiasis).   EPINEPHrine 0.3 mg/0.3 mL Soaj injection Commonly known as:  EPI-PEN INJECT 0.3 MLS INTO THE MUSCLE ONCE. What changed:  See the new instructions.   escitalopram 10 MG tablet Commonly known as:  LEXAPRO TAKE 1  TABLET (10 MG TOTAL) DAILY BY MOUTH.   esomeprazole 40 MG capsule Commonly known as:  NEXIUM TAKE 1 CAPSULE BY MOUTH TWICE DAILY What changed:  when to take this   famotidine 10 MG tablet Commonly known as:  PEPCID Take 10 mg by mouth at bedtime.   furosemide 20 MG tablet Commonly known as:  LASIX TAKE 1 TABLET BY MOUTH EVERY DAY   isosorbide mononitrate 30 MG 24 hr tablet Commonly known as:  IMDUR Take 1 tablet (30 mg total) by mouth daily.   levothyroxine 200 MCG tablet Commonly known as:  SYNTHROID, LEVOTHROID TAKE 1 TABLET BY MOUTH EVERY DAY ON EMPTY STOMACH BEFORE BREAKFAST What changed:    how much to take  how to take this  when to take this  additional instructions   metoprolol tartrate 25 MG tablet Commonly known as:  LOPRESSOR Take 1 tablet (25 mg total) by mouth every morning.   montelukast 10 MG tablet Commonly known as:  SINGULAIR Take 1 tablet (10 mg total) by mouth at bedtime.   nitroGLYCERIN 0.4 MG SL tablet Commonly known as:  NITROSTAT Place 1 tablet (0.4 mg total) under the tongue every 5 (five) minutes as needed for chest pain.   olmesartan 40 MG tablet Commonly known as:  BENICAR TAKE 1 TABLET BY MOUTH DAILY   OSCIMIN SR 0.375 MG 12 hr tablet Generic drug:   hyoscyamine Take 0.357 mg by mouth daily.   traZODone 150 MG tablet Commonly known as:  DESYREL Take 1 tablet (150 mg total) by mouth at bedtime.   VENTOLIN HFA 108 (90 Base) MCG/ACT inhaler Generic drug:  albuterol USE 2 PUFFS EVERY 6 HOURS AS NEEDED FOR WHEEZING What changed:  See the new instructions.   Vitamin D3 125 MCG (5000 UT) Caps Take 5,000 Units by mouth daily.   XARELTO 20 MG Tabs tablet Generic drug:  rivaroxaban TAKE 1 TABLET (20 MG TOTAL) BY MOUTH DAILY WITH SUPPER. What changed:  See the new instructions.   zolpidem 10 MG tablet Commonly known as:  AMBIEN Take 1 tablet (10 mg total) by mouth at bedtime as needed. What changed:  reasons to take this      Follow-up Information    Tammy Crome, MD. Call.   Specialty:  Cardiology Why:  Follow up within 1 week  Contact information: [address] [place]  [address] [phone]          Allergies  Allergen Reactions  . Aspirin Shortness Of Breath  . Lisinopril Anaphylaxis and Other (See Comments)    Possibly Lisinopril or Levaquin, pt was taking both at the time of reaction  . Bee Venom Swelling  . Morphine And Related Itching and Other (See Comments)    Needs benadryl prior to    Consultations:  Cardiology  Procedures/Studies: Dg Chest Port 1 View  Result Date: 01/13/2019 CLINICAL DATA:  Left-sided chest pain.  Shortness of breath. EXAM: PORTABLE CHEST 1 VIEW COMPARISON:  December 27, 2016 FINDINGS: The heart size is borderline but stable. The hila and mediastinum are unremarkable. No pulmonary nodules or masses. No focal infiltrates. No overt edema. IMPRESSION: No active disease. Electronically Signed   By: Dorise Bullion III M.D   On: 01/13/2019 22:52   US Abdomen Limited Ruq  Result Date: 01/14/2019 CLINICAL DATA:  Abdominal pain.  Elevated bilirubin. EXAM: ULTRASOUND ABDOMEN LIMITED RIGHT UPPER QUADRANT COMPARISON:  CT 01/07/2013 FINDINGS: Gallbladder: Physiologically  distended with multiple shadowing gallstones. No gallbladder wall thickening  or pericholecystic fluid. No sonographic Murphy sign noted by sonographer. Common bile duct: Diameter: 3 mm. Liver: Liver parenchyma is difficult to penetrate due to habitus. Heterogeneous parenchyma. No discrete focal lesion, however evaluation is limited. Portal vein is patent on color Doppler imaging with normal direction of blood flow towards the liver. IMPRESSION: 1. Gallstones without sonographic findings of acute cholecystitis or biliary dilatation. 2. Liver parenchyma is difficult to penetrate due to habitus. Electronically Signed   By: Keith Rake M.D.   On: 01/14/2019 03:20   Cath: 01/14/2019   60 to 70% mid LAD with both DFR and FFR (0.91 and 0.84 respectively) documenting absence of hemodynamic significance.  Normal left main  Normal circumflex  Normal RCA  Normal LV function and LVEDP. EF 65%  RECOMMENDATIONS:   Possible allergy to aspirin  Aggressive risk factor modification. Started high intensity statin therapy. Other aggressive secondary risk modification measures will also be stressed.  Resume Xarelto greater than 12 hours after sheath pull from radial artery  Needs clinical follow-up and if has convincing symptoms of angina in the future, the LAD would be reasonably easy to treat with percutaneous therapy.  TTE: 01/14/2019  IMPRESSIONS   1. The left ventricle has normal systolic function with an ejection fraction of 60-65%. The cavity size was normal. Left ventricular diastolic parameters were normal. 2. The right ventricle has mildly reduced systolic function. The cavity was moderately enlarged. There is no increase in right ventricular wall thickness. Right ventricular systolic pressure is severely elevated with an estimated pressure of 71.7 mmHg. 3. The mitral valve is normal in structure. There is mild mitral annular calcification present. 4. The tricuspid valve is  normal in structure. 5. The aortic valve is tricuspid. 6. The pulmonic valve was normal in structure. 7. The inferior vena cava was dilated in size with >50% respiratory variability. 8. The interatrial septum appears to be lipomatous.   Subjective: Seen And examined chest pain had resolved.  No nausea or vomiting.  Ambulated and desaturated but refused oxygen.  Denies any other concerns or complaints at this time and follow-up with PCP and cardiology within the coming weeks.  Discharge Exam: Vitals:   01/15/19 0810 01/15/19 1217  BP: (!) 143/67 131/73  Pulse: 78 67  Resp: 18 15  Temp: 98.6 F (37 C) 98.6 F (37 C)  SpO2: 96% 92%   Vitals:   01/14/19 2358 01/15/19 0426 01/15/19 0810 01/15/19 1217  BP: (!) 111/50 (!) 127/58 (!) 143/67 131/73  Pulse: 76 80 78 67  Resp:   18 15  Temp: 99 F (37.2 C) 98.3 F (36.8 C) 98.6 F (37 C) 98.6 F (37 C)  TempSrc: Oral Oral Oral Oral  SpO2: 98% 94% 96% 92%  Weight:  (!) 138.9 kg    Height:       General: Pt is alert, awake, not in acute distress Cardiovascular: RRR, S1/S2 +, no rubs, no gallops Respiratory: Diminished bilaterally, no wheezing, no rhonchi Abdominal: Soft, NT, Distended , bowel sounds + Extremities: no edema, no cyanosis  The results of significant diagnostics from this hospitalization (including imaging, microbiology, ancillary and laboratory) are listed below for reference.    Microbiology: No results found for this or any previous visit (from the past 240 hour(s)).   Labs: BNP (last 3 results) No results for input(s): BNP in the last 8760 hours. Basic Metabolic Panel: Recent Labs  Lab 01/13/19 2254 01/13/19 2358 01/14/19 0420 01/15/19 0355  NA 131*  --  137 137  K 6.2* 4.1 4.2 3.7  CL 98  --  103 101  CO2 22  --  22 26  GLUCOSE 129*  --  119* 134*  BUN 14  --  15 12  CREATININE 0.85  --  0.99 0.82  CALCIUM 8.7*  --  8.9 8.9  MG  --   --   --  1.6*  PHOS  --   --   --  2.7   Liver Function  Tests: Recent Labs  Lab 01/13/19 2254 01/14/19 0420 01/15/19 0355  AST 47* 26 14*  ALT 28 15 13   ALKPHOS 70 71 58  BILITOT 1.4* 1.2 0.7  PROT 7.2 7.2 6.2*  ALBUMIN 3.5 3.6 2.9*   Recent Labs  Lab 01/13/19 2254  LIPASE 21   No results for input(s): AMMONIA in the last 168 hours. CBC: Recent Labs  Lab 01/13/19 2254 01/14/19 0420 01/15/19 0355  WBC 15.2* 16.8* 16.1*  NEUTROABS  --   --  10.9*  HGB 13.5 12.7 11.4*  HCT 43.7 41.3 37.9  MCV 86.2 86.8 88.3  PLT 256 233 203   Cardiac Enzymes: Recent Labs  Lab 01/14/19 0420 01/14/19 0748 01/14/19 1334  TROPONINI <0.03 <0.03 <0.03   BNP: Invalid input(s): POCBNP CBG: Recent Labs  Lab 01/14/19 1203 01/14/19 1712 01/14/19 2138 01/15/19 0806 01/15/19 1213  GLUCAP 122* 123* 134* 118* 132*   D-Dimer Recent Labs    01/14/19 0748  DDIMER 0.69*   Hgb A1c Recent Labs    01/14/19 0420  HGBA1C 6.1*   Lipid Profile No results for input(s): CHOL, HDL, LDLCALC, TRIG, CHOLHDL, LDLDIRECT in the last 72 hours. Thyroid function studies Recent Labs    01/14/19 0420  TSH 1.778   Anemia work up No results for input(s): VITAMINB12, FOLATE, FERRITIN, TIBC, IRON, RETICCTPCT in the last 72 hours. Urinalysis    Component Value Date/Time   COLORURINE YELLOW 02/14/2013 2027   APPEARANCEUR CLEAR 02/14/2013 2027   LABSPEC 1.028 02/14/2013 2027   PHURINE 5.5 02/14/2013 2027   GLUCOSEU NEGATIVE 02/14/2013 2027   HGBUR NEGATIVE 02/14/2013 2027   BILIRUBINUR NEG 09/13/2018 1134   KETONESUR negative 05/05/2015 0837   KETONESUR NEGATIVE 02/14/2013 2027   PROTEINUR Positive (A) 09/13/2018 1134   PROTEINUR NEGATIVE 02/14/2013 2027   UROBILINOGEN 0.2 09/13/2018 1134   UROBILINOGEN 0.2 02/14/2013 2027   NITRITE NEG 09/13/2018 1134   NITRITE NEGATIVE 02/14/2013 2027   LEUKOCYTESUR Negative 09/13/2018 1134   Sepsis Labs Invalid input(s): PROCALCITONIN,  WBC,  LACTICIDVEN Microbiology No results found for this or any  previous visit (from the past 240 hour(s)).  Time coordinating discharge: 35 minutes  SIGNED:  Kerney Elbe, DO [facility] 01/16/2019, 12:50 PM Pager is on [place]  If 7PM-7AM, please contact night-coverage [email or web address] Password [email or web address]

## 2019-01-15 NOTE — Discharge Instructions (Signed)
Radial Site Care ° °This sheet gives you information about how to care for yourself after your procedure. Your health care provider may also give you more specific instructions. If you have problems or questions, contact your health care provider. °What can I expect after the procedure? °After the procedure, it is common to have: °· Bruising and tenderness at the catheter insertion area. °Follow these instructions at home: °Medicines °· Take over-the-counter and prescription medicines only as told by your health care provider. °Insertion site care °· Follow instructions from your health care provider about how to take care of your insertion site. Make sure you: °? Wash your hands with soap and water before you change your bandage (dressing). If soap and water are not available, use hand sanitizer. °? Change your dressing as told by your health care provider. °? Leave stitches (sutures), skin glue, or adhesive strips in place. These skin closures may need to stay in place for 2 weeks or longer. If adhesive strip edges start to loosen and curl up, you may trim the loose edges. Do not remove adhesive strips completely unless your health care provider tells you to do that. °· Check your insertion site every day for signs of infection. Check for: °? Redness, swelling, or pain. °? Fluid or blood. °? Pus or a bad smell. °? Warmth. °· Do not take baths, swim, or use a hot tub until your health care provider approves. °· You may shower 24-48 hours after the procedure, or as directed by your health care provider. °? Remove the dressing and gently wash the site with plain soap and water. °? Pat the area dry with a clean towel. °? Do not rub the site. That could cause bleeding. °· Do not apply powder or lotion to the site. °Activity ° °· For 24 hours after the procedure, or as directed by your health care provider: °? Do not flex or bend the affected arm. °? Do not push or pull heavy objects with the affected arm. °? Do not  drive yourself home from the hospital or clinic. You may drive 24 hours after the procedure unless your health care provider tells you not to. °? Do not operate machinery or power tools. °· Do not lift anything that is heavier than 10 lb (4.5 kg), or the limit that you are told, until your health care provider says that it is safe. °· Ask your health care provider when it is okay to: °? Return to work or school. °? Resume usual physical activities or sports. °? Resume sexual activity. °General instructions °· If the catheter site starts to bleed, raise your arm and put firm pressure on the site. If the bleeding does not stop, get help right away. This is a medical emergency. °· If you went home on the same day as your procedure, a responsible adult should be with you for the first 24 hours after you arrive home. °· Keep all follow-up visits as told by your health care provider. This is important. °Contact a health care provider if: °· You have a fever. °· You have redness, swelling, or yellow drainage around your insertion site. °Get help right away if: °· You have unusual pain at the radial site. °· The catheter insertion area swells very fast. °· The insertion area is bleeding, and the bleeding does not stop when you hold steady pressure on the area. °· Your arm or hand becomes pale, cool, tingly, or numb. °These symptoms may represent a serious problem   that is an emergency. Do not wait to see if the symptoms will go away. Get medical help right away. Call your local emergency services (911 in the [place] Do not drive yourself to the hospital. °Summary °· After the procedure, it is common to have bruising and tenderness at the site. °· Follow instructions from your health care provider about how to take care of your radial site wound. Check the wound every day for signs of infection. °· Do not lift anything that is heavier than 10 lb (4.5 kg), or the limit that you are told, until your health care provider says  that it is safe. °This information is not intended to replace advice given to you by your health care provider. Make sure you discuss any questions you have with your health care provider. °Document Released: 12/03/2010 Document Revised: 12/06/2017 Document Reviewed: 12/06/2017 °[employer] Interactive Patient Education © 2019 [employer] ° °

## 2019-01-15 NOTE — Care Management Note (Addendum)
Case Management Note  Patient Details  Name: [name] MRN: [id number] Date of Birth: [birth year]  Subjective/Objective:  Pt presented for Chest Pain. PTA independent from home. Patient states she will stay with her sister post hospitalization. Pt has PCP and is abel to get medications without any problems.                   Action/Plan: No home needs identified at this time.   Expected Discharge Date:  01/16/19               Expected Discharge Plan:  Home/Self Care  In-House Referral:  NA  Discharge planning Services  CM Consult  Post Acute Care Choice:  NA Choice offered to:  NA  DME Arranged:  N/A DME Agency:  NA  HH Arranged:  NA HH Agency:  NA  Status of Service:  Completed, signed off  If discussed at [place] of Stay Meetings, dates discussed:    Additional Comments: [id number] 01-15-19 Jacqlyn Krauss, RN,BSN Case Manager [phone] Charge RN ambulated patient and oxygen saturations in the 80's with ambulation. Pt has CPAP in the home and is refusing 02 at this time. CM did discuss with patient the need for 02- declined at this time. Pt states she will go to her PCP if she feels she needs in the future. Plan to see MD Friday to check white count. No further needs from CM at this time.   Bethena Roys, RN 01/15/2019, 10:43 AM

## 2019-01-15 NOTE — Care Management Obs Status (Signed)
[place] NOTIFICATION   Patient Details  Name: LONETTA BLASSINGAME MRN: [id number] Date of Birth: [birth year]   Medicare Observation Status Notification Given:  Yes    Bethena Roys, RN 01/15/2019, 10:24 AM

## 2019-01-15 NOTE — Progress Notes (Signed)
SATURATION QUALIFICATIONS: (This note is used to comply with regulatory documentation for home oxygen)  Patient Saturations on Room Air at Rest = 95%  Patient Saturations on Room Air while Ambulating = 82%  Patient Saturations on 2 Liters of oxygen while Ambulating = 97%  Please briefly explain why patient needs home oxygen: pt desats on RA when ambulating however patient refusing oxygen at discharge - already has CPAP for night time

## 2019-01-23 ENCOUNTER — Other Ambulatory Visit: Payer: Self-pay

## 2019-01-23 ENCOUNTER — Encounter: Payer: Self-pay | Admitting: Nurse Practitioner

## 2019-01-23 ENCOUNTER — Ambulatory Visit (INDEPENDENT_AMBULATORY_CARE_PROVIDER_SITE_OTHER): Payer: Medicare Other | Admitting: Nurse Practitioner

## 2019-01-23 VITALS — BP 122/76 | HR 73 | Ht 65.0 in | Wt 307.8 lb

## 2019-01-23 DIAGNOSIS — Z9889 Other specified postprocedural states: Secondary | ICD-10-CM

## 2019-01-23 DIAGNOSIS — D72829 Elevated white blood cell count, unspecified: Secondary | ICD-10-CM | POA: Diagnosis not present

## 2019-01-23 DIAGNOSIS — I259 Chronic ischemic heart disease, unspecified: Secondary | ICD-10-CM

## 2019-01-23 LAB — CBC WITH DIFFERENTIAL/PLATELET
Basophils Absolute: 0.1 10*3/uL (ref 0.0–0.2)
Basos: 1 %
EOS (ABSOLUTE): 0.6 10*3/uL — ABNORMAL HIGH (ref 0.0–0.4)
Eos: 5 %
Hematocrit: 37.7 % (ref 34.0–46.6)
Hemoglobin: 11.9 g/dL (ref 11.1–15.9)
Immature Grans (Abs): 0.1 10*3/uL (ref 0.0–0.1)
Immature Granulocytes: 1 %
Lymphocytes Absolute: 2.7 10*3/uL (ref 0.7–3.1)
Lymphs: 25 %
MCH: 27.5 pg (ref 26.6–33.0)
MCHC: 31.6 g/dL (ref 31.5–35.7)
MCV: 87 fL (ref 79–97)
Monocytes Absolute: 1 10*3/uL — ABNORMAL HIGH (ref 0.1–0.9)
Monocytes: 9 %
Neutrophils Absolute: 6.2 10*3/uL (ref 1.4–7.0)
Neutrophils: 59 %
Platelets: 348 10*3/uL (ref 150–450)
RBC: 4.33 x10E6/uL (ref 3.77–5.28)
RDW: 13.4 % (ref 11.7–15.4)
WBC: 10.5 10*3/uL (ref 3.4–10.8)

## 2019-01-23 LAB — BASIC METABOLIC PANEL
BUN/Creatinine Ratio: 11 — ABNORMAL LOW (ref 12–28)
BUN: 11 mg/dL (ref 8–27)
CO2: 24 mmol/L (ref 20–29)
Calcium: 8.8 mg/dL (ref 8.7–10.3)
Chloride: 103 mmol/L (ref 96–106)
Creatinine, Ser: 0.98 mg/dL (ref 0.57–1.00)
GFR calc Af Amer: 70 mL/min/{1.73_m2} (ref >59)
GFR calc non Af Amer: 60 mL/min/{1.73_m2} (ref >59)
Glucose: 138 mg/dL — ABNORMAL HIGH (ref 65–99)
Potassium: 3.7 mmol/L (ref 3.5–5.2)
Sodium: 143 mmol/L (ref 134–144)

## 2019-01-23 LAB — MAGNESIUM: Magnesium: 1.4 mg/dL — ABNORMAL LOW (ref 1.6–2.3)

## 2019-01-23 NOTE — Progress Notes (Signed)
CARDIOLOGY OFFICE NOTE  Date:  01/23/2019    [name] Date of Birth: May 02, 1951 Medical Record #[id number]  PCP:  Elby Showers, MD  Cardiologist:  Jennings Books  Chief Complaint  Patient presents with  . Follow-up    Post hospital visit - seen for Dr. [name]    History of Present Illness: [name] is a 68 y.o. female who presents today for a post hospital visit. Seen for Dr. [name].   She is a super morbidly obese 68 year old Caucasian female with past medical history significant for asthma, depression anxiety, diabetes mellitus, history of diverticulitis and diverticulosis, GERD, hypertension, hypothyroidism, migraine, history of PE, and other comorbidities.   She presented earlier this month to the hospital with chest pain. In light of multiple CV risk factors, she was admitted. Allergic to aspirin. Noted leukocytosis. She did under go cardiac cath which showed 60% mid LAD lesion that was not significant via FFR and aggressive risk factor modification was encouraged. She was hypoxic - but refused oxygen. She was to see her PCP.  Comes in today. Here with her sister. They both use walkers. [name] is doing ok - has had some headache with the Imdur - but taking. No further chest pain. She was hypoxic again coming here today - does not wish to be on oxygen. She had just started using CPAP 3 days prior to her admission - notes that she did ok with this. She is asking about her echo results showing right side strain. She is pretty sedentary. Has not seen PCP yet. No follow up labs yet.   Past Medical History:  Diagnosis Date  . Anemia    none recently  . Anxiety   . Arthritis   . Asthma   . Complete tear of right rotator cuff 12/05/2014  . Depression   . Diabetes mellitus    not on meds.denies a1c 5.8  . Diverticulitis   . Diverticulosis   . Family history of adverse reaction to anesthesia    mom  nausea and vomiting  . GERD (gastroesophageal  reflux disease)   . Hypertension   . Hypothyroidism   . Migraine   . Personal history of kidney stones   . Pneumonia    hx  . Pulmonary embolism ([place])    a. diagnosed in 01/2017 w/ imaging showing multiple right lung PE with right heart strain. Started on Xarelto.   . Shortness of breath dyspnea    exersion  . Thyroid disease    hypo  . Wears glasses     Past Surgical History:  Procedure Laterality Date  . ABDOMINAL HYSTERECTOMY    . CATARACT EXTRACTION, BILATERAL    . CESAREAN SECTION    . COLONOSCOPY     multiple   . ESOPHAGOGASTRODUODENOSCOPY ENDOSCOPY     multiple  . INTRAVASCULAR PRESSURE WIRE/FFR STUDY N/A 01/14/2019   Procedure: INTRAVASCULAR PRESSURE WIRE/FFR STUDY;  Surgeon: Belva Crome, MD;  Location: [facility];  Service: Cardiovascular;  Laterality: N/A;  . KNEE CARTILAGE SURGERY     Left  . LAPAROSCOPIC PARTIAL COLECTOMY N/A 04/22/2013   Procedure: LAPAROSCOPIC PARTIAL COLECTOMY;  Surgeon: Odis Hollingshead, MD;  Location: [facility];  Service: General;  Laterality: N/A;  . LEFT HEART CATH AND CORONARY ANGIOGRAPHY N/A 01/14/2019   Procedure: LEFT HEART CATH AND CORONARY ANGIOGRAPHY;  Surgeon: Belva Crome, MD;  Location: [facility];  Service: Cardiovascular;  Laterality: N/A;  .  right foot surgery  little toe and next toe   x 3  . SHOULDER ARTHROSCOPY WITH ROTATOR CUFF REPAIR AND SUBACROMIAL DECOMPRESSION Right 12/05/2014   Procedure: RIGHT SHOULDER ARTHROSCOPY,ACROMOPLASTY, ROTATOR CUFF REPAIR;  Surgeon: [name], MD;  Location: [facility];  Service: Orthopedics;  Laterality: Right;     Medications: Current Meds  Medication Sig  . ALPRAZolam (XANAX) 0.5 MG tablet TAKE 1 TABLET BY MOUTH TWICE A DAY AS NEEDED (Patient taking differently: Take 0.5 mg by mouth 2 (two) times daily as needed for anxiety. )  . atorvastatin (LIPITOR) 80 MG tablet Take 1 tablet (80 mg total) by mouth daily at 6 PM.  . cetirizine (ZYRTEC) 10 MG tablet  Take 10 mg by mouth at bedtime.   . Cholecalciferol (VITAMIN D3) 5000 units CAPS Take 5,000 Units by mouth daily.  . clobetasol ointment (TEMOVATE) 5.63 % Apply 1 application topically 2 (two) times daily as needed (skin irritation).  . clotrimazole (MYCELEX) 10 MG troche Take 10 mg by mouth 3 (three) times daily as needed (candidiasis).   Marland Kitchen EPINEPHRINE 0.3 mg/0.3 mL IJ SOAJ injection INJECT 0.3 MLS INTO THE MUSCLE ONCE. (Patient taking differently: Inject 0.3 mg into the muscle daily as needed for anaphylaxis. )  . escitalopram (LEXAPRO) 10 MG tablet TAKE 1 TABLET (10 MG TOTAL) DAILY BY MOUTH.  Marland Kitchen esomeprazole (NEXIUM) 40 MG capsule TAKE 1 CAPSULE BY MOUTH TWICE DAILY (Patient taking differently: Take 40 mg by mouth daily. )  . famotidine (PEPCID) 10 MG tablet Take 10 mg by mouth at bedtime.  . furosemide (LASIX) 20 MG tablet TAKE 1 TABLET BY MOUTH EVERY DAY (Patient taking differently: Take 20 mg by mouth daily. )  . isosorbide mononitrate (IMDUR) 30 MG 24 hr tablet Take 1 tablet (30 mg total) by mouth daily.  Marland Kitchen levothyroxine (SYNTHROID, LEVOTHROID) 200 MCG tablet TAKE 1 TABLET BY MOUTH EVERY DAY ON EMPTY STOMACH BEFORE BREAKFAST (Patient taking differently: Take 200 mcg by mouth daily before breakfast. )  . metoprolol tartrate (LOPRESSOR) 25 MG tablet Take 1 tablet (25 mg total) by mouth every morning.  . montelukast (SINGULAIR) 10 MG tablet Take 1 tablet (10 mg total) by mouth at bedtime.  . nitroGLYCERIN (NITROSTAT) 0.4 MG SL tablet Place 1 tablet (0.4 mg total) under the tongue every 5 (five) minutes as needed for chest pain.  Marland Kitchen olmesartan (BENICAR) 40 MG tablet TAKE 1 TABLET BY MOUTH DAILY (Patient taking differently: Take 40 mg by mouth daily. )  . OSCIMIN SR 0.375 MG 12 hr tablet Take 0.357 mg by mouth daily.  . traZODone (DESYREL) 150 MG tablet Take 1 tablet (150 mg total) by mouth at bedtime.  . VENTOLIN HFA 108 (90 Base) MCG/ACT inhaler USE 2 PUFFS EVERY 6 HOURS AS NEEDED FOR WHEEZING  (Patient taking differently: Inhale 1-2 puffs into the lungs every 6 (six) hours as needed for wheezing. )  . XARELTO 20 MG TABS tablet TAKE 1 TABLET (20 MG TOTAL) BY MOUTH DAILY WITH SUPPER. (Patient taking differently: Take 20 mg by mouth daily. )  . zolpidem (AMBIEN) 10 MG tablet Take 1 tablet (10 mg total) by mouth at bedtime as needed.     Allergies: Allergies  Allergen Reactions  . Aspirin Shortness Of Breath  . Lisinopril Anaphylaxis and Other (See Comments)    Possibly Lisinopril or Levaquin, pt was taking both at the time of reaction  . Bee Venom Swelling  . Morphine And Related Itching and Other (See Comments)  Needs benadryl prior to     Social History: The patient  reports that she quit smoking about 36 years ago. Her smoking use included cigarettes. She has a 3.00 pack-year smoking history. She has never used smokeless tobacco. She reports that she does not drink alcohol or use drugs.   Family History: The patient's family history includes Breast cancer in her paternal aunt; Diabetes in her father; Heart disease in her mother; Kidney failure in her father.   Review of Systems: Please see the history of present illness.   Otherwise, the review of systems is positive for none.   All other systems are reviewed and negative.   Physical Exam: VS:  BP 122/76 (BP Location: Left Wrist, Patient Position: Sitting, Cuff Size: Normal)   Pulse 73   Ht 5\' 5"  (1.651 m)   Wt (!) 307 lb 12.8 oz (139.6 kg)   SpO2 (!) 87% Comment: walkin in/94 at rest  BMI 51.22 kg/m  .  BMI Body mass index is 51.22 kg/m.  Wt Readings from Last 3 Encounters:  01/23/19 (!) 307 lb 12.8 oz (139.6 kg)  01/15/19 (!) 306 lb 3.2 oz (138.9 kg)  11/27/18 (!) 309 lb 9.6 oz (140.4 kg)    General: Pleasant. Morbidly obese. Alert and in no acute distress. Using a walker.   HEENT: Normal.  Neck: Supple, no JVD, carotid bruits, or masses noted.  Cardiac: Regular rate and rhythm. No murmurs, rubs, or  gallops. No significant edema.  Respiratory:  Lungs are clear to auscultation bilaterally with normal work of breathing.  GI: Soft and nontender.  MS: No deformity or atrophy. Gait and ROM intact. Using a walker.  Skin: Warm and dry. Color is normal.  Neuro:  Strength and sensation are intact and no gross focal deficits noted.  Psych: Alert, appropriate and with normal affect. Right wrist cath site with small knot and resolving bruising - good 2+ radial pulse noted.    LABORATORY DATA:  EKG:  EKG is not ordered today.  Lab Results  Component Value Date   WBC 16.1 (H) 01/15/2019   HGB 11.4 (L) 01/15/2019   HCT 37.9 01/15/2019   PLT 203 01/15/2019   GLUCOSE 134 (H) 01/15/2019   CHOL 159 09/13/2018   TRIG 108 09/13/2018   HDL 42 (L) 09/13/2018   LDLCALC 97 09/13/2018   ALT 13 01/15/2019   AST 14 (L) 01/15/2019   NA 137 01/15/2019   K 3.7 01/15/2019   CL 101 01/15/2019   CREATININE 0.82 01/15/2019   BUN 12 01/15/2019   CO2 26 01/15/2019   TSH 1.778 01/14/2019   INR 1.11 01/19/2017   HGBA1C 6.1 (H) 01/14/2019   MICROALBUR 1.3 09/13/2018     BNP (last 3 results) No results for input(s): BNP in the last 8760 hours.  ProBNP (last 3 results) No results for input(s): PROBNP in the last 8760 hours.   Other Studies Reviewed Today:  INTRAVASCULAR PRESSURE WIRE/FFR STUDY 01/2019  LEFT HEART CATH AND CORONARY ANGIOGRAPHY  Conclusion    60 to 70% mid LAD with both DFR and FFR (0.91 and 0.84 respectively) documenting absence of hemodynamic significance.  Normal left main  Normal circumflex  Normal RCA  Normal LV function and LVEDP.  EF 65%  RECOMMENDATIONS:   Possible allergy to aspirin  Aggressive risk factor modification.  Started high intensity statin therapy.  Other aggressive secondary risk modification measures will also be stressed.  Resume Xarelto greater than 12 hours after sheath pull from radial  artery  Needs clinical follow-up and if has convincing  symptoms of angina in the future, the LAD would be reasonably easy to treat with percutaneous therapy.   ECHO IMPRESSIONS 01/2019   1. The left ventricle has normal systolic function with an ejection fraction of 60-65%. The cavity size was normal. Left ventricular diastolic parameters were normal.  2. The right ventricle has mildly reduced systolic function. The cavity was moderately enlarged. There is no increase in right ventricular wall thickness. Right ventricular systolic pressure is severely elevated with an estimated pressure of 71.7 mmHg.  3. The mitral valve is normal in structure. There is mild mitral annular calcification present.  4. The tricuspid valve is normal in structure.  5. The aortic valve is tricuspid.  6. The pulmonic valve was normal in structure.  7. The inferior vena cava was dilated in size with >50% respiratory variability.  8. The interatrial septum appears to be lipomatous.   Assessment/Plan:  1. Recent admission for chest pain - stable cardiac cath findings - needs aggressive CV risk factor modification unfortunately, limited by morbid obesity/sedentary lifestyle/orthopedic issues. No recurrent chest pain. Some headache with the Imdur - I think this will subside - encouraged to take at night and ok to use Tylenol.   2. Prior history of PE - remains on chronic Xarelto  3. Morbid obesity - this is unfortunately, the crux of her issues.   4. HTN - BP looks ok today.   5. HLD - on statin therapy - will need follow up lipids on return.   6. DM - per PCP  7. Leukocytosis - to follow up with PCP - will recheck today - she remembers seeing Dr. Earlie Server many years ago (it was 2007) - unclear why but she believes it had to do with her blood.   8. First-degree AV block on EKG  9. History of hypothyroidism,currently on Synthroid.  10. Obesity hypoventilation syndrome/obstructive sleep apnea - on CPAP - refused oxygen. Remains hypoxic. Explained that this is  the consequence of her echo findings. Refuses oxygen. Encouraged her to keep using the CPAP.   11. Hypomagnesemia - replaced prior to discharge - recheck today.   Current medicines are reviewed with the patient today.  The patient does not have concerns regarding medicines other than what has been noted above.  The following changes have been made:  See above.  Labs/ tests ordered today include:    Orders Placed This Encounter  Procedures  . Basic metabolic panel  . Magnesium  . CBC with Differential/Platelet     Disposition:   FU with [place] in 3 months. Will see what her labs show.   Patient is agreeable to this plan and will call if any problems develop in the interim.   SignedTruitt Merle, NP  01/23/2019 8:57 AM  [facility] [address] [place], [place]  [address] Phone: [phone] Fax: [phone]

## 2019-01-23 NOTE — Patient Instructions (Addendum)
We will be checking the following labs today - BMET, CBC with diff, Mag level   Medication Instructions:    Continue with your current medicines.   Keep taking the Imdur - the headache should get better - ok to take at night   If you need a refill on your cardiac medications before your next appointment, please call your pharmacy.     Testing/Procedures To Be Arranged:  N/A  Follow-Up:   See Dr.Smith in about 3 months    At [facility], you and your health needs are our priority.  As part of our continuing mission to provide you with exceptional heart care, we have created designated Provider Care Teams.  These Care Teams include your primary Cardiologist (physician) and Advanced Practice Providers (APPs -  Physician Assistants and Nurse Practitioners) who all work together to provide you with the care you need, when you need it.  Special Instructions:  . None  Call the [place] office at [phone] if you have any questions, problems or concerns.

## 2019-01-24 ENCOUNTER — Other Ambulatory Visit: Payer: Self-pay | Admitting: *Deleted

## 2019-01-24 DIAGNOSIS — D72829 Elevated white blood cell count, unspecified: Secondary | ICD-10-CM

## 2019-01-24 DIAGNOSIS — I10 Essential (primary) hypertension: Secondary | ICD-10-CM

## 2019-01-24 DIAGNOSIS — I259 Chronic ischemic heart disease, unspecified: Secondary | ICD-10-CM

## 2019-01-24 MED ORDER — MAGNESIUM OXIDE 400 (241.3 MG) MG PO TABS: 400.0000 mg | ORAL_TABLET | Freq: Two times a day (BID) | ORAL | 6 refills | Status: DC

## 2019-02-04 ENCOUNTER — Encounter: Payer: Self-pay | Admitting: *Deleted

## 2019-02-07 ENCOUNTER — Other Ambulatory Visit: Payer: Medicare Other

## 2019-02-08 ENCOUNTER — Other Ambulatory Visit: Payer: Self-pay | Admitting: Nurse Practitioner

## 2019-02-14 ENCOUNTER — Other Ambulatory Visit: Payer: Self-pay

## 2019-02-14 MED ORDER — LEVOTHYROXINE SODIUM 200 MCG PO TABS: ORAL_TABLET | ORAL | 1 refills | Status: DC

## 2019-02-18 ENCOUNTER — Telehealth: Payer: Self-pay | Admitting: Nurse Practitioner

## 2019-02-18 NOTE — Telephone Encounter (Signed)
New Message   Patient is calling because she wants to cancel her lab appt on the 9th. But first she wants to know how far out can she go to reschedule the lab. Please call.

## 2019-02-18 NOTE — Telephone Encounter (Signed)
Ok to move lab out 4 weeks

## 2019-02-18 NOTE — Telephone Encounter (Signed)
Moved pt's lab appt out for four weeks.  Pt agreeable with plan.

## 2019-02-21 ENCOUNTER — Other Ambulatory Visit: Payer: Medicare Other

## 2019-03-05 ENCOUNTER — Other Ambulatory Visit: Payer: Self-pay | Admitting: Internal Medicine

## 2019-03-05 DIAGNOSIS — E1100 Type 2 diabetes mellitus with hyperosmolarity without nonketotic hyperglycemic-hyperosmolar coma (NKHHC): Secondary | ICD-10-CM

## 2019-03-05 DIAGNOSIS — E039 Hypothyroidism, unspecified: Secondary | ICD-10-CM

## 2019-03-19 ENCOUNTER — Other Ambulatory Visit: Payer: Medicare Other

## 2019-03-19 ENCOUNTER — Other Ambulatory Visit: Payer: Self-pay

## 2019-03-19 ENCOUNTER — Other Ambulatory Visit: Payer: Self-pay | Admitting: Interventional Cardiology

## 2019-03-19 DIAGNOSIS — I10 Essential (primary) hypertension: Secondary | ICD-10-CM

## 2019-03-19 DIAGNOSIS — I259 Chronic ischemic heart disease, unspecified: Secondary | ICD-10-CM

## 2019-03-19 DIAGNOSIS — D72829 Elevated white blood cell count, unspecified: Secondary | ICD-10-CM | POA: Diagnosis not present

## 2019-03-19 LAB — BASIC METABOLIC PANEL
BUN/Creatinine Ratio: 21 (ref 12–28)
BUN: 21 mg/dL (ref 8–27)
CO2: 26 mmol/L (ref 20–29)
Calcium: 9.5 mg/dL (ref 8.7–10.3)
Chloride: 100 mmol/L (ref 96–106)
Creatinine, Ser: 1.02 mg/dL — ABNORMAL HIGH (ref 0.57–1.00)
GFR calc Af Amer: 66 mL/min/{1.73_m2} (ref >59)
GFR calc non Af Amer: 57 mL/min/{1.73_m2} — ABNORMAL LOW (ref >59)
Glucose: 132 mg/dL — ABNORMAL HIGH (ref 65–99)
Potassium: 4.5 mmol/L (ref 3.5–5.2)
Sodium: 140 mmol/L (ref 134–144)

## 2019-03-19 LAB — MAGNESIUM: Magnesium: 1.6 mg/dL (ref 1.6–2.3)

## 2019-03-20 ENCOUNTER — Ambulatory Visit (INDEPENDENT_AMBULATORY_CARE_PROVIDER_SITE_OTHER): Payer: Medicare Other | Admitting: Nurse Practitioner

## 2019-03-20 ENCOUNTER — Encounter: Payer: Self-pay | Admitting: Nurse Practitioner

## 2019-03-20 DIAGNOSIS — G4733 Obstructive sleep apnea (adult) (pediatric): Secondary | ICD-10-CM | POA: Diagnosis not present

## 2019-03-20 MED ORDER — PREDNISONE 10 MG PO TABS: 20.0000 mg | ORAL_TABLET | Freq: Every day | ORAL | 0 refills | Status: DC

## 2019-03-20 NOTE — Patient Instructions (Signed)
OSA: Patient continues to benefit from CPAP with good compliance and control documented Continue CPAP at current settings Continue current medications Goal of 4 hours or more usage per night Maintain healthy weight Do not drive if drowsy  Asthma exacerbation: Will order prednisone until patient can be seen by PCP tomorrow with current complaints of wheezing and shortness of breath over the past 3 days.

## 2019-03-20 NOTE — Progress Notes (Signed)
Virtual Visit via Telephone Note  I connected with [name] on 03/20/19 at  3:00 PM EDT by telephone and verified that I am speaking with the correct person using two identifiers.  Location: Patient: home Provider: office   I discussed the limitations, risks, security and privacy concerns of performing an evaluation and management service by telephone and the availability of in person appointments. I also discussed with the patient that there may be a patient responsible charge related to this service. The patient expressed understanding and agreed to proceed.   History of Present Illness: 68 year old female former smoker (quit 1984) by Dr. Halford Chessman for OSA. Patient does have a history of asthma, PE and DVT in March 2018, and hypertension.  Patient has a tele-visit today for follow-up on new start CPAP.  She states that she has been doing well with her CPAP and is compliant.  Denies any current issues with mask.  She states that she is still trying to get adjusted to wearing the machine each night.  She does feel improvement throughout the day.  She complains today of some shortness of breath and wheezing for the past 3 days.  She has a visit scheduled with her PCP tomorrow for this issue.  Her PCP manages her asthma. Denies f/c/s, n/v/d, hemoptysis, PND, leg swelling.     Observations/Objective:  HST 11/19/17 showed an AHI 14.6, SpO2 low 73%  CPAP compliance report 02/18/19 - 03/19/19: Usage days 27/30 (90%), average usage 6 hours 2 minutes, CPAP AutoSet 5-15 cmH20, AHI: 2.6  Assessment and Plan: OSA: Patient continues to benefit from CPAP with good compliance and control documented Continue CPAP at current settings Continue current medications Goal of 4 hours or more usage per night Maintain healthy weight Do not drive if drowsy  Asthma exacerbation: Will order prednisone until patient can be seen by PCP tomorrow with current complaints of wheezing and shortness of breath  over the past 3 days.  Follow Up Instructions:  Follow up with Dr. Halford Chessman in 6 months or sooner if needed   I discussed the assessment and treatment plan with the patient. The patient was provided an opportunity to ask questions and all were answered. The patient agreed with the plan and demonstrated an understanding of the instructions.   The patient was advised to call back or seek an in-person evaluation if the symptoms worsen or if the condition fails to improve as anticipated.  I provided 22 minutes of non-face-to-face time during this encounter.   Fenton Foy, NP

## 2019-03-20 NOTE — Telephone Encounter (Signed)
Pt last saw Truitt Merle, NP on 01/23/19, last labs 03/19/19 Creat 1.02, age 68, weight 139.6kg, CrCl 119.56, based on CrCl pt is on appropriate dosage of Xarelto 20mg  QD.  Will refill rx.

## 2019-03-20 NOTE — Assessment & Plan Note (Signed)
OSA: Patient continues to benefit from CPAP with good compliance and control documented Continue CPAP at current settings Continue current medications Goal of 4 hours or more usage per night Maintain healthy weight Do not drive if drowsy  Asthma exacerbation: Will order prednisone until patient can be seen by PCP tomorrow with current complaints of wheezing and shortness of breath over the past 3 days.  Follow up with Dr. Halford Chessman in 6 months or sooner if needed

## 2019-03-21 ENCOUNTER — Encounter: Payer: Self-pay | Admitting: Internal Medicine

## 2019-03-21 ENCOUNTER — Other Ambulatory Visit: Payer: Self-pay

## 2019-03-21 ENCOUNTER — Ambulatory Visit (INDEPENDENT_AMBULATORY_CARE_PROVIDER_SITE_OTHER): Payer: Medicare Other | Admitting: Internal Medicine

## 2019-03-21 VITALS — Wt 300.0 lb

## 2019-03-21 DIAGNOSIS — E1100 Type 2 diabetes mellitus with hyperosmolarity without nonketotic hyperglycemic-hyperosmolar coma (NKHHC): Secondary | ICD-10-CM

## 2019-03-21 DIAGNOSIS — M25551 Pain in right hip: Secondary | ICD-10-CM | POA: Diagnosis not present

## 2019-03-21 DIAGNOSIS — M25562 Pain in left knee: Secondary | ICD-10-CM

## 2019-03-21 DIAGNOSIS — I10 Essential (primary) hypertension: Secondary | ICD-10-CM

## 2019-03-21 DIAGNOSIS — I259 Chronic ischemic heart disease, unspecified: Secondary | ICD-10-CM

## 2019-03-21 DIAGNOSIS — E785 Hyperlipidemia, unspecified: Secondary | ICD-10-CM

## 2019-03-21 DIAGNOSIS — Z7901 Long term (current) use of anticoagulants: Secondary | ICD-10-CM | POA: Diagnosis not present

## 2019-03-21 DIAGNOSIS — G8929 Other chronic pain: Secondary | ICD-10-CM

## 2019-03-21 DIAGNOSIS — R0902 Hypoxemia: Secondary | ICD-10-CM | POA: Diagnosis not present

## 2019-03-21 DIAGNOSIS — F419 Anxiety disorder, unspecified: Secondary | ICD-10-CM | POA: Diagnosis not present

## 2019-03-21 DIAGNOSIS — Z86711 Personal history of pulmonary embolism: Secondary | ICD-10-CM

## 2019-03-21 DIAGNOSIS — K219 Gastro-esophageal reflux disease without esophagitis: Secondary | ICD-10-CM

## 2019-03-21 DIAGNOSIS — F32A Depression, unspecified: Secondary | ICD-10-CM

## 2019-03-21 DIAGNOSIS — E039 Hypothyroidism, unspecified: Secondary | ICD-10-CM | POA: Diagnosis not present

## 2019-03-21 DIAGNOSIS — G4733 Obstructive sleep apnea (adult) (pediatric): Secondary | ICD-10-CM

## 2019-03-21 DIAGNOSIS — F329 Major depressive disorder, single episode, unspecified: Secondary | ICD-10-CM

## 2019-03-21 DIAGNOSIS — M25552 Pain in left hip: Secondary | ICD-10-CM

## 2019-03-21 DIAGNOSIS — M25561 Pain in right knee: Secondary | ICD-10-CM

## 2019-03-21 NOTE — Progress Notes (Signed)
   Subjective:    Patient ID: [name], female    DOB: 1951/02/13, 68 y.o.   MRN: [id number]  HPI 68 year old Female with multiple medical issues here for 6 month recheck.    Seen today due to coronavirus pandemic via interactive audio and video telecommunications.  Patient is identified as [name]. [name], a longstanding patient in this practice using 2 identifiers.  Patient is agreeable to visit in this format.  TSH was checked in March and was WNL.  Hgb AIC checked in March and was 6.1%  Had  Magnesium checked and was low at 1.6.  She was started on magnesium supplement with follow-up by cardiology in the near future.  Had CPAP visit for obstructive sleep apnea with NP yesterday. Doing OK with it.Has had it since the end of February.  Apparently home oxygen was recommended but patient declined.  Patient has history of asthma, anxiety depression, diabetes mellitus, history of diverticulitis and diverticulosis, GE reflux, hypertension, hypothyroidism, migraine headaches, history of PE.  She was admitted on March 1 with left-sided chest pain and placed on nitroglycerin drip.  Came hypoxic.  Was given an albuterol treatment.  Was treated with aspirin.  She had a leukocytosis.  Chest x-ray did not show pneumonia.  Had been on Xarelto prior history of and staff did not feel that she was having a PE,  Her TSH was checked and was stable.  Cardiac cath showed 60% mid LAD that was not significant.  Cardiology recommended aggressive risk factor modification and management statin as well as Imdur.  Placed on a PPI although she does not report reflux symptoms.  Was admitted March 2020 with chest pain.  Still on Xarelto.  Echo with normal LV function.  Patient says she was SOB 2 days ago. Used inhaler. Had runny nose. NP gave her Prednisone 20 mg daily x 5 days.  Has had pain in shoulder recently which prednisone has helped in the past  Recommend Pulse oximeter for home use.  Hx running low  O2 sats. Says she qualifies for home oxygen but does not want it now.         Review of Systems see above     Objective:   Physical Exam  Not examined but spent 25 minutes with her going over her various medical issues  She will be seeing nurse practitioner at [facility] in the near future.  .  Patient looks tired.  Denies running fever.    Assessment & Plan:  Admission in March for chest pain.  MI ruled out.  Remains on Xarelto for prior history of PE.  PE was diagnosed in March 2018  Hypoxia- refuses home oxygen  Morbid obesity  Sleep apnea-has CPAP device  Anxiety depression treated with Xanax and antidepressant  Patient started on Lipitor to lower cardiac risk factors  Impaired glucose tolerance-hemoglobin A1c 6.1% in hospital.  Elevated liver functions on March 1.  Total bilirubin was 1.4 with direct bilirubin 0.9 but improved to total bilirubin of 1.2 and direct bilirubin of 0.3 by the following day.  Alkaline phosphatase was normal.  SGOT was elevated at 47 on March 1.  Chest pain was left-sided and not right-sided.  Doubt gallbladder issues.  Plan: Follow-up in 6 months for health maintenance exam and fasting lab studies.  Continue close follow-up with cardiology.

## 2019-03-22 ENCOUNTER — Telehealth: Payer: Self-pay | Admitting: Internal Medicine

## 2019-03-22 MED ORDER — MONTELUKAST SODIUM 10 MG PO TABS: 10.0000 mg | ORAL_TABLET | Freq: Every day | ORAL | 3 refills | Status: DC

## 2019-03-22 NOTE — Telephone Encounter (Signed)
Received Fax RX request from  [facility]  Medication - montelukast (SINGULAIR) 10 MG tablet   Last Refill - 2.6.20  Last OV - 5.7.20  Last CPE - 09/13/18  Scheduled CPE 09/16/19

## 2019-04-13 ENCOUNTER — Encounter: Payer: Self-pay | Admitting: Internal Medicine

## 2019-04-13 MED ORDER — ZOLPIDEM TARTRATE 10 MG PO TABS: 10.0000 mg | ORAL_TABLET | Freq: Every evening | ORAL | 0 refills | Status: DC | PRN

## 2019-04-14 NOTE — Patient Instructions (Signed)
It was a pleasure to see you today by virtual visit.  Agree with recommendations per cardiology.  Watch diet.  Follow-up in 6 months here with annual Medicare wellness visit and routine health maintenance along with fasting labs.

## 2019-04-17 ENCOUNTER — Telehealth: Payer: Self-pay

## 2019-04-17 NOTE — Telephone Encounter (Signed)
YOUR CARDIOLOGY TEAM HAS ARRANGED FOR AN E-VISIT FOR YOUR APPOINTMENT - PLEASE REVIEW IMPORTANT INFORMATION BELOW SEVERAL DAYS PRIOR TO YOUR APPOINTMENT  Due to the recent COVID-19 pandemic, we are transitioning in-person office visits to tele-medicine visits in an effort to decrease unnecessary exposure to our patients, their families, and staff. These visits are billed to your insurance just like a normal visit is. We also encourage you to sign up for MyChart if you have not already done so. You will need a smartphone if possible. For patients that do not have this, we can still complete the visit using a regular telephone but do prefer a smartphone to enable video when possible. You may have a family member that lives with you that can help. If possible, we also ask that you have a blood pressure cuff and scale at home to measure your blood pressure, heart rate and weight prior to your scheduled appointment. Patients with clinical needs that need an in-person evaluation and testing will still be able to come to the office if absolutely necessary. If you have any questions, feel free to call our office.     YOUR PROVIDER WILL BE USING THE FOLLOWING PLATFORM TO COMPLETE YOUR VISIT: Doximity  . IF USING MYCHART - How to Download the MyChart App to Your SmartPhone   - If Apple, go to App Store and type in MyChart in the search bar and download the app. If Android, ask patient to go to Google Play Store and type in MyChart in the search bar and download the app. The app is free but as with any other app downloads, your phone may require you to verify saved payment information or Apple/Android password.  - You will need to then log into the app with your MyChart username and password, and select [facility] as your healthcare provider to link the account.  - When it is time for your visit, go to the MyChart app, find appointments, and click Begin Video Visit. Be sure to Select Allow for your device to  access the Microphone and Camera for your visit. You will then be connected, and your provider will be with you shortly.  **If you have any issues connecting or need assistance, please contact MyChart service desk [phone] ([phone])**  **If using a computer, in order to ensure the best quality for your visit, you will need to use either of the following Internet Browsers: Google Chrome or Microsoft Edge**  . IF USING DOXIMITY or [email or web address].ME - The staff will give you instructions on receiving your link to join the meeting the day of your visit.      2-3 DAYS BEFORE YOUR APPOINTMENT  You will receive a telephone call from one of our [facility] team members - your caller ID may say "Unknown caller." If this is a video visit, we will walk you through how to get the video launched on your phone. We will remind you check your blood pressure, heart rate and weight prior to your scheduled appointment. If you have an Apple Watch or Kardia, please upload any pertinent ECG strips the day before or morning of your appointment to MyChart. Our staff will also make sure you have reviewed the consent and agree to move forward with your scheduled tele-health visit.     THE DAY OF YOUR APPOINTMENT  Approximately 15 minutes prior to your scheduled appointment, you will receive a telephone call from one of HeartCare team - your caller ID may say "Unknown caller."    Our staff will confirm medications, vital signs for the day and any symptoms you may be experiencing. Please have this information available prior to the time of visit start. It may also be helpful for you to have a pad of paper and pen handy for any instructions given during your visit. They will also walk you through joining the smartphone meeting if this is a video visit.    CONSENT FOR TELE-HEALTH VISIT - PLEASE REVIEW  I hereby voluntarily request, consent and authorize [facility] and its employed or contracted physicians, physician  assistants, nurse practitioners or other licensed health care professionals (the Practitioner), to provide me with telemedicine health care services (the "Services") as deemed necessary by the treating Practitioner. I acknowledge and consent to receive the Services by the Practitioner via telemedicine. I understand that the telemedicine visit will involve communicating with the Practitioner through live audiovisual communication technology and the disclosure of certain medical information by electronic transmission. I acknowledge that I have been given the opportunity to request an in-person assessment or other available alternative prior to the telemedicine visit and am voluntarily participating in the telemedicine visit.  I understand that I have the right to withhold or withdraw my consent to the use of telemedicine in the course of my care at any time, without affecting my right to future care or treatment, and that the Practitioner or I may terminate the telemedicine visit at any time. I understand that I have the right to inspect all information obtained and/or recorded in the course of the telemedicine visit and may receive copies of available information for a reasonable fee.  I understand that some of the potential risks of receiving the Services via telemedicine include:  . Delay or interruption in medical evaluation due to technological equipment failure or disruption; . Information transmitted may not be sufficient (e.g. poor resolution of images) to allow for appropriate medical decision making by the Practitioner; and/or  . In rare instances, security protocols could fail, causing a breach of personal health information.  Furthermore, I acknowledge that it is my responsibility to provide information about my medical history, conditions and care that is complete and accurate to the best of my ability. I acknowledge that Practitioner's advice, recommendations, and/or decision may be based on  factors not within their control, such as incomplete or inaccurate data provided by me or distortions of diagnostic images or specimens that may result from electronic transmissions. I understand that the practice of medicine is not an exact science and that Practitioner makes no warranties or guarantees regarding treatment outcomes. I acknowledge that I will receive a copy of this consent concurrently upon execution via email to the email address I last provided but may also request a printed copy by calling the office of [facility].    I understand that my insurance will be billed for this visit.   I have read or had this consent read to me. . I understand the contents of this consent, which adequately explains the benefits and risks of the Services being provided via telemedicine.  . I have been provided ample opportunity to ask questions regarding this consent and the Services and have had my questions answered to my satisfaction. . I give my informed consent for the services to be provided through the use of telemedicine in my medical care  By participating in this telemedicine visit I agree to the above.  

## 2019-04-21 NOTE — Progress Notes (Signed)
Virtual Visit via Video Note   This visit type was conducted due to national recommendations for restrictions regarding the COVID-19 Pandemic (e.g. social distancing) in an effort to limit this patient's exposure and mitigate transmission in our community.  Due to her co-morbid illnesses, this patient is at least at moderate risk for complications without adequate follow up.  This format is felt to be most appropriate for this patient at this time.  All issues noted in this document were discussed and addressed.  A limited physical exam was performed with this format.  Please refer to the patient's chart for her consent to telehealth for [facility].   Date:  04/22/2019   ID:  [name], DOB 11-05-1951, MRN [id number]  Patient Location: Home Provider Location: Home  PCP:  Elby Showers, MD Cardiologist:  No primary care provider on file.  Electrophysiologist:  None   Evaluation Performed:  Follow-Up Visit  Chief Complaint:  CAD/Obesity  History of Present Illness:    [name] is a 68 y.o. female with obesity, hypertension, DM II, CAD (60-70% mid LAD), and thyroid disease.  She feels well.  She is not having any chest pain.  Isosorbide caused headache initially but that is resolved.  Dyspnea on exertion occurs.  She refuses oxygen.  She has moderately severe pulmonary hypertension.  She has not needed to use nitroglycerin.  Pulmonary hypertension likely secondary to obesity hypoventilation and pulmonary embolism with documented RVE on recent 2 D doppler echocardiogram/efuses to use oxygen. She is compliant with C-PAP.   The patient does not have symptoms concerning for COVID-19 infection (fever, chills, cough, or new shortness of breath).    Past Medical History:  Diagnosis Date  . Anemia    none recently  . Anxiety   . Arthritis   . Asthma   . Complete tear of right rotator cuff 12/05/2014  . Depression   . Diabetes mellitus    not on  meds.denies a1c 5.8  . Diverticulitis   . Diverticulosis   . Family history of adverse reaction to anesthesia    mom  nausea and vomiting  . GERD (gastroesophageal reflux disease)   . Hypertension   . Hypothyroidism   . Migraine   . Personal history of kidney stones   . Pneumonia    hx  . Pulmonary embolism ([place])    a. diagnosed in 01/2017 w/ imaging showing multiple right lung PE with right heart strain. Started on Xarelto.   . Shortness of breath dyspnea    exersion  . Thyroid disease    hypo  . Wears glasses    Past Surgical History:  Procedure Laterality Date  . ABDOMINAL HYSTERECTOMY    . CATARACT EXTRACTION, BILATERAL    . CESAREAN SECTION    . COLONOSCOPY     multiple   . ESOPHAGOGASTRODUODENOSCOPY ENDOSCOPY     multiple  . INTRAVASCULAR PRESSURE WIRE/FFR STUDY N/A 01/14/2019   Procedure: INTRAVASCULAR PRESSURE WIRE/FFR STUDY;  Surgeon: Belva Crome, MD;  Location: [facility];  Service: Cardiovascular;  Laterality: N/A;  . KNEE CARTILAGE SURGERY     Left  . LAPAROSCOPIC PARTIAL COLECTOMY N/A 04/22/2013   Procedure: LAPAROSCOPIC PARTIAL COLECTOMY;  Surgeon: Odis Hollingshead, MD;  Location: [facility];  Service: General;  Laterality: N/A;  . LEFT HEART CATH AND CORONARY ANGIOGRAPHY N/A 01/14/2019   Procedure: LEFT HEART CATH AND CORONARY ANGIOGRAPHY;  Surgeon: Belva Crome, MD;  Location: [facility];  Service: Cardiovascular;  Laterality: N/A;  . right foot surgery  little toe and next toe   x 3  . SHOULDER ARTHROSCOPY WITH ROTATOR CUFF REPAIR AND SUBACROMIAL DECOMPRESSION Right 12/05/2014   Procedure: RIGHT SHOULDER ARTHROSCOPY,ACROMOPLASTY, ROTATOR CUFF REPAIR;  Surgeon: [name], MD;  Location: [facility];  Service: Orthopedics;  Laterality: Right;     Current Meds  Medication Sig  . ALPRAZolam (XANAX) 0.5 MG tablet TAKE 1 TABLET BY MOUTH TWICE A DAY AS NEEDED  . atorvastatin (LIPITOR) 80 MG tablet TAKE 1 TABLET (80 MG TOTAL) BY  MOUTH DAILY AT 6 PM.  . cetirizine (ZYRTEC) 10 MG tablet Take 10 mg by mouth at bedtime.   . Cholecalciferol (VITAMIN D3) 5000 units CAPS Take 5,000 Units by mouth daily.  . clobetasol ointment (TEMOVATE) 9.70 % Apply 1 application topically 2 (two) times daily as needed (skin irritation).  . clotrimazole (MYCELEX) 10 MG troche Take 10 mg by mouth 3 (three) times daily as needed (candidiasis).   Marland Kitchen escitalopram (LEXAPRO) 10 MG tablet TAKE 1 TABLET (10 MG TOTAL) DAILY BY MOUTH.  Marland Kitchen esomeprazole (NEXIUM) 40 MG capsule Take 40 mg by mouth daily at 12 noon.  . famotidine (PEPCID) 10 MG tablet Take 10 mg by mouth at bedtime.  . furosemide (LASIX) 20 MG tablet TAKE 1 TABLET BY MOUTH EVERY DAY  . isosorbide mononitrate (IMDUR) 30 MG 24 hr tablet Take 1 tablet (30 mg total) by mouth daily.  Marland Kitchen levothyroxine (SYNTHROID, LEVOTHROID) 200 MCG tablet TAKE 1 TABLET BY MOUTH EVERY DAY ON EMPTY STOMACH BEFORE BREAKFAST  . magnesium oxide (MAG-OX) 400 (241.3 Mg) MG tablet Take 1 tablet (400 mg total) by mouth 2 (two) times daily.  . metoprolol tartrate (LOPRESSOR) 25 MG tablet Take 1 tablet (25 mg total) by mouth every morning.  . montelukast (SINGULAIR) 10 MG tablet Take 1 tablet (10 mg total) by mouth at bedtime.  . nitroGLYCERIN (NITROSTAT) 0.4 MG SL tablet Place 1 tablet (0.4 mg total) under the tongue every 5 (five) minutes as needed for chest pain.  Marland Kitchen olmesartan (BENICAR) 40 MG tablet TAKE 1 TABLET BY MOUTH DAILY  . OSCIMIN SR 0.375 MG 12 hr tablet Take 0.357 mg by mouth daily.  . traZODone (DESYREL) 150 MG tablet Take 1 tablet (150 mg total) by mouth at bedtime.  . VENTOLIN HFA 108 (90 Base) MCG/ACT inhaler USE 2 PUFFS EVERY 6 HOURS AS NEEDED FOR WHEEZING  . XARELTO 20 MG TABS tablet TAKE 1 TABLET (20 MG TOTAL) BY MOUTH DAILY WITH SUPPER.  Marland Kitchen zolpidem (AMBIEN) 10 MG tablet Take 1 tablet (10 mg total) by mouth at bedtime as needed.     Allergies:   Aspirin; Lisinopril; Bee venom; and Morphine and related    Social History   Tobacco Use  . Smoking status: Former Smoker    Packs/day: 0.50    Years: 6.00    Pack years: 3.00    Types: Cigarettes    Last attempt to quit: 11/14/1982    Years since quitting: 36.4  . Smokeless tobacco: Never Used  . Tobacco comment: quit smoking 30 years ago  Substance Use Topics  . Alcohol use: No  . Drug use: No     Family Hx: The patient's family history includes Breast cancer in her paternal aunt; Diabetes in her father; Heart disease in her mother; Kidney failure in her father. There is no history of Colon cancer.  ROS:   Please see the history of present  illness.    DOE and otherwise no complints. All other systems reviewed and are negative.   Prior CV studies:   The following studies were reviewed today:  No new data.  Labs/Other Tests and Data Reviewed:    EKG:  No ECG reviewed.  Recent Labs: 01/14/2019: TSH 1.778 01/15/2019: ALT 13 01/23/2019: Hemoglobin 11.9; Platelets 348 03/19/2019: BUN 21; Creatinine, Ser 1.02; Magnesium 1.6; Potassium 4.5; Sodium 140   Recent Lipid Panel Lab Results  Component Value Date/Time   CHOL 159 09/13/2018 12:01 PM   TRIG 108 09/13/2018 12:01 PM   HDL 42 (L) 09/13/2018 12:01 PM   CHOLHDL 3.8 09/13/2018 12:01 PM   LDLCALC 97 09/13/2018 12:01 PM    Wt Readings from Last 3 Encounters:  04/22/19 (!) 307 lb (139.3 kg)  03/21/19 300 lb (136.1 kg)  01/23/19 (!) 307 lb 12.8 oz (139.6 kg)     Objective:    Vital Signs:  BP 130/86   Pulse 73   Ht 5\' 5"  (1.651 m)   Wt (!) 307 lb (139.3 kg)   BMI 51.09 kg/m    VITAL SIGNS:  reviewed GEN:  Obese RESPIRATORY:  normal respiratory effort, symmetric expansion CARDIOVASCULAR:  no peripheral edema NEURO:  alert and oriented x 3, no obvious focal deficit  ASSESSMENT & PLAN:    1. Coronary artery disease of native artery of native heart with stable angina pectoris ([place])   2. Essential hypertension   3. Other secondary pulmonary hypertension ([place])   4. Other  acute pulmonary embolism with acute cor pulmonale ([place])   5. Type 2 diabetes mellitus with hyperosmolarity without coma, without long-term current use of insulin ([place])   6. Educated About Covid-19 Virus Infection    PLAN:  1. Stable with no anginal complaints. Secondary risk prevention re-discussed. 2. Target 130/80 mmHg 3. Multifactorial related to Obesity, OSA, and prior PE. Encouraged chronic O2 as has been recommended. She will think about it. 4. No new events. 5. A1C < 7.  Overall education and awareness concerning primary/secondary risk prevention was discussed in detail: LDL less than 70, hemoglobin A1c less than 7, blood pressure target less than 130/80 mmHg, >150 minutes of moderate aerobic activity per week, avoidance of smoking, weight control (via diet and exercise), and continued surveillance/management of/for obstructive sleep apnea.   COVID-19 Education: The signs and symptoms of COVID-19 were discussed with the patient and how to seek care for testing (follow up with PCP or arrange E-visit).  The importance of social distancing was discussed today.  Time:   Today, I have spent 15 minutes with the patient with telehealth technology discussing the above problems.     Medication Adjustments/Labs and Tests Ordered: Current medicines are reviewed at length with the patient today.  Concerns regarding medicines are outlined above.   Tests Ordered: No orders of the defined types were placed in this encounter.   Medication Changes: No orders of the defined types were placed in this encounter.   Disposition:  Follow up in 4 month(s)  Signed, Sinclair Grooms, MD  04/22/2019 3:46 PM    [facility]

## 2019-04-22 ENCOUNTER — Telehealth (INDEPENDENT_AMBULATORY_CARE_PROVIDER_SITE_OTHER): Payer: Medicare Other | Admitting: Interventional Cardiology

## 2019-04-22 ENCOUNTER — Encounter: Payer: Self-pay | Admitting: Interventional Cardiology

## 2019-04-22 VITALS — BP 130/86 | HR 73 | Ht 65.0 in | Wt 307.0 lb

## 2019-04-22 DIAGNOSIS — I25118 Atherosclerotic heart disease of native coronary artery with other forms of angina pectoris: Secondary | ICD-10-CM

## 2019-04-22 DIAGNOSIS — I2609 Other pulmonary embolism with acute cor pulmonale: Secondary | ICD-10-CM

## 2019-04-22 DIAGNOSIS — E1100 Type 2 diabetes mellitus with hyperosmolarity without nonketotic hyperglycemic-hyperosmolar coma (NKHHC): Secondary | ICD-10-CM

## 2019-04-22 DIAGNOSIS — Z7189 Other specified counseling: Secondary | ICD-10-CM

## 2019-04-22 DIAGNOSIS — I10 Essential (primary) hypertension: Secondary | ICD-10-CM

## 2019-04-22 DIAGNOSIS — I2729 Other secondary pulmonary hypertension: Secondary | ICD-10-CM | POA: Insufficient documentation

## 2019-04-22 NOTE — Patient Instructions (Signed)
Medication Instructions:  1) DISCONTINUE Isosorbide  If you need a refill on your cardiac medications before your next appointment, please call your pharmacy.   Lab work: Your physician recommends that you return for lab work in: June or July.  You will need to come fasting for these labs (nothing to eat or drink after midnight except water and black coffee).  If you have labs (blood work) drawn today and your tests are completely normal, you will receive your results only by: Marland Kitchen MyChart Message (if you have MyChart) OR . A paper copy in the mail If you have any lab test that is abnormal or we need to change your treatment, we will call you to review the results.  Testing/Procedures: None  Follow-Up: At [facility], you and your health needs are our priority.  As part of our continuing mission to provide you with exceptional heart care, we have created designated Provider Care Teams.  These Care Teams include your primary Cardiologist (physician) and Advanced Practice Providers (APPs -  Physician Assistants and Nurse Practitioners) who all work together to provide you with the care you need, when you need it. You will need a follow up appointment in 3-4 months.  Please call our office 2 months in advance to schedule this appointment.  You may see Dr. [name] or one of the following Advanced Practice Providers on your designated Care Team:   Truitt Merle, NP Cecilie Kicks, NP . Kathyrn Drown, NP  Any Other Special Instructions Will Be Listed Below (If Applicable).

## 2019-05-08 ENCOUNTER — Other Ambulatory Visit: Payer: Medicare Other | Admitting: *Deleted

## 2019-05-08 ENCOUNTER — Other Ambulatory Visit: Payer: Self-pay

## 2019-05-08 DIAGNOSIS — I25118 Atherosclerotic heart disease of native coronary artery with other forms of angina pectoris: Secondary | ICD-10-CM | POA: Diagnosis not present

## 2019-05-08 DIAGNOSIS — I2729 Other secondary pulmonary hypertension: Secondary | ICD-10-CM | POA: Diagnosis not present

## 2019-05-08 DIAGNOSIS — E1100 Type 2 diabetes mellitus with hyperosmolarity without nonketotic hyperglycemic-hyperosmolar coma (NKHHC): Secondary | ICD-10-CM | POA: Diagnosis not present

## 2019-05-08 DIAGNOSIS — I10 Essential (primary) hypertension: Secondary | ICD-10-CM | POA: Diagnosis not present

## 2019-05-09 LAB — LIPID PANEL
Chol/HDL Ratio: 2.4 ratio (ref 0.0–4.4)
Cholesterol, Total: 102 mg/dL (ref 100–199)
HDL: 43 mg/dL (ref >39)
LDL Calculated: 40 mg/dL (ref 0–99)
Triglycerides: 96 mg/dL (ref 0–149)
VLDL Cholesterol Cal: 19 mg/dL (ref 5–40)

## 2019-05-09 LAB — HEPATIC FUNCTION PANEL
ALT: 11 IU/L (ref 0–32)
AST: 13 IU/L (ref 0–40)
Albumin: 4 g/dL (ref 3.8–4.8)
Alkaline Phosphatase: 89 IU/L (ref 39–117)
Bilirubin Total: 0.4 mg/dL (ref 0.0–1.2)
Bilirubin, Direct: 0.15 mg/dL (ref 0.00–0.40)
Total Protein: 6.6 g/dL (ref 6.0–8.5)

## 2019-05-09 LAB — HEMOGLOBIN A1C
Est. average glucose Bld gHb Est-mCnc: 131 mg/dL
Hgb A1c MFr Bld: 6.2 % — ABNORMAL HIGH (ref 4.8–5.6)

## 2019-06-11 DIAGNOSIS — Z79899 Other long term (current) drug therapy: Secondary | ICD-10-CM | POA: Diagnosis not present

## 2019-06-11 DIAGNOSIS — L438 Other lichen planus: Secondary | ICD-10-CM | POA: Diagnosis not present

## 2019-06-20 ENCOUNTER — Other Ambulatory Visit: Payer: Self-pay | Admitting: Internal Medicine

## 2019-06-25 NOTE — Progress Notes (Signed)
Reviewed and agree with assessment/plan.   Megen Madewell, MD [facility] 11/09/2016, 12:24 PM Pager:  [phone]  

## 2019-07-03 ENCOUNTER — Other Ambulatory Visit: Payer: Self-pay | Admitting: Internal Medicine

## 2019-07-16 ENCOUNTER — Other Ambulatory Visit: Payer: Self-pay | Admitting: Internal Medicine

## 2019-07-16 DIAGNOSIS — Z1231 Encounter for screening mammogram for malignant neoplasm of breast: Secondary | ICD-10-CM

## 2019-07-23 ENCOUNTER — Other Ambulatory Visit: Payer: Self-pay | Admitting: Nurse Practitioner

## 2019-07-29 ENCOUNTER — Other Ambulatory Visit: Payer: Self-pay | Admitting: Internal Medicine

## 2019-08-16 DIAGNOSIS — Z23 Encounter for immunization: Secondary | ICD-10-CM | POA: Diagnosis not present

## 2019-08-21 ENCOUNTER — Other Ambulatory Visit: Payer: Self-pay | Admitting: Internal Medicine

## 2019-08-30 ENCOUNTER — Other Ambulatory Visit: Payer: Self-pay

## 2019-08-30 ENCOUNTER — Ambulatory Visit
Admission: RE | Admit: 2019-08-30 | Discharge: 2019-08-30 | Disposition: A | Discharge status: Home / Self Care | Payer: Medicare Other | Source: Ambulatory Visit | Attending: Internal Medicine | Admitting: Internal Medicine

## 2019-08-30 DIAGNOSIS — Z1231 Encounter for screening mammogram for malignant neoplasm of breast: Secondary | ICD-10-CM

## 2019-09-16 ENCOUNTER — Ambulatory Visit (INDEPENDENT_AMBULATORY_CARE_PROVIDER_SITE_OTHER): Payer: Medicare Other | Admitting: Internal Medicine

## 2019-09-16 ENCOUNTER — Other Ambulatory Visit: Payer: Self-pay

## 2019-09-16 ENCOUNTER — Encounter: Payer: Self-pay | Admitting: Internal Medicine

## 2019-09-16 VITALS — BP 120/88 | HR 72 | Temp 98.3°F | Ht 65.0 in | Wt 296.0 lb

## 2019-09-16 DIAGNOSIS — G8929 Other chronic pain: Secondary | ICD-10-CM

## 2019-09-16 DIAGNOSIS — Z23 Encounter for immunization: Secondary | ICD-10-CM | POA: Diagnosis not present

## 2019-09-16 DIAGNOSIS — F419 Anxiety disorder, unspecified: Secondary | ICD-10-CM | POA: Diagnosis not present

## 2019-09-16 DIAGNOSIS — Z86711 Personal history of pulmonary embolism: Secondary | ICD-10-CM

## 2019-09-16 DIAGNOSIS — M25562 Pain in left knee: Secondary | ICD-10-CM

## 2019-09-16 DIAGNOSIS — E1100 Type 2 diabetes mellitus with hyperosmolarity without nonketotic hyperglycemic-hyperosmolar coma (NKHHC): Secondary | ICD-10-CM | POA: Diagnosis not present

## 2019-09-16 DIAGNOSIS — L439 Lichen planus, unspecified: Secondary | ICD-10-CM

## 2019-09-16 DIAGNOSIS — I10 Essential (primary) hypertension: Secondary | ICD-10-CM | POA: Diagnosis not present

## 2019-09-16 DIAGNOSIS — M25552 Pain in left hip: Secondary | ICD-10-CM

## 2019-09-16 DIAGNOSIS — M25551 Pain in right hip: Secondary | ICD-10-CM | POA: Diagnosis not present

## 2019-09-16 DIAGNOSIS — F32A Depression, unspecified: Secondary | ICD-10-CM

## 2019-09-16 DIAGNOSIS — Z0000 Encounter for general adult medical examination without abnormal findings: Secondary | ICD-10-CM | POA: Diagnosis not present

## 2019-09-16 DIAGNOSIS — M25561 Pain in right knee: Secondary | ICD-10-CM | POA: Diagnosis not present

## 2019-09-16 DIAGNOSIS — I259 Chronic ischemic heart disease, unspecified: Secondary | ICD-10-CM | POA: Diagnosis not present

## 2019-09-16 DIAGNOSIS — E785 Hyperlipidemia, unspecified: Secondary | ICD-10-CM

## 2019-09-16 DIAGNOSIS — Z7901 Long term (current) use of anticoagulants: Secondary | ICD-10-CM | POA: Diagnosis not present

## 2019-09-16 DIAGNOSIS — F329 Major depressive disorder, single episode, unspecified: Secondary | ICD-10-CM | POA: Diagnosis not present

## 2019-09-16 DIAGNOSIS — G4733 Obstructive sleep apnea (adult) (pediatric): Secondary | ICD-10-CM

## 2019-09-16 LAB — POCT URINALYSIS DIPSTICK
Appearance: NEGATIVE
Bilirubin, UA: NEGATIVE
Blood, UA: NEGATIVE
Glucose, UA: NEGATIVE
Ketones, UA: NEGATIVE
Leukocytes, UA: NEGATIVE
Nitrite, UA: NEGATIVE
Odor: NEGATIVE
Protein, UA: POSITIVE — AB
Spec Grav, UA: 1.01 (ref 1.010–1.025)
Urobilinogen, UA: 0.2 E.U./dL
pH, UA: 6.5 (ref 5.0–8.0)

## 2019-09-16 NOTE — Progress Notes (Signed)
Subjective:    Patient ID: [name], female    DOB: [birth year], 68 y.o.   MRN: [id number]  HPI 68 year old Female for health maintenance exam and evaluation of medical issues.  Had cardiac cath in March by Dr. [name] see results below  Colonoscopy 2012 with repeat study recommended 2024  Review of her lab work shows creatinine elevated at 1.29 fasting in 6 months ago was 1.02.  She will hydrate well and we will have repeat be met in a few weeks.  She will have a renal ultrasound to rule out obstruction.  History of obstructive sleep apnea followed by pulmonary.  History of acute saddle pulmonary embolus March 2018 with acute cor pulmonale and is on chronic anticoagulation therapy.  Has osteoarthritis of both knees.  Says she needs right knee arthroplasty but does not want to pursue it.  Ambulates with a cane when necessary.  History of lichen planus of the mouth which is intermittent exacerbations.  History of angioedema  History of diverticulitis in the proximal descending colon status post laparoscopic-assisted left colectomy June 2014.  History of right shoulder arthroscopic surgery acromioplasty and rotator cuff repair January 2016  History of GE reflux, depression, insomnia, ulcerative colitis, hypokalemia, migraine headaches, asthma, B12 deficiency.  Her B12 level in 2016 was 33.  Also history of hypothyroidism.  Additional past medical history: Fractured left radial head 1995, epicondylitis right elbow in the past, strep throat January 2005, right upper lobe pneumonia March 2011, C-section 1977, abdominal hysterectomy with right salpingectomy January 1998, history of bilateral cataract extractions.  Repair of left knee medial meniscus tear in 2008.  Admitted check December 2007 with syncope.  Work-up was unremarkable.  Had herpes zoster July 2007.  Hospitalized with probable viral gastroenteritis April 2011.  Surgery for chronic sinusitis November 2010.  History  of gallstones and hepatic steatosis.  History of iron deficiency anemia that resolved after hysterectomy.  History of noncardiac and nonpulmonary chest pain 2011.  At that time had negative cardiac enzymes, negative EKG negative CT angiography although her D-dimer was elevated.  Last colonoscopy was in 2014 with 10-year follow-up recommended.  She only had diverticulosis during that study.  Colonoscopy in 2009 showed severe colitis in the left colon.  She previously had panendoscopy and findings were seen in the terminal ileum that were thought to be possibly due to inflammatory bowel disease or from aspirin or anti-inflammatory medications.  Pathology from biopsy June 2009 revealed ulcerative colonic mucosa with granulomas.  History of positive skin test to dust mites and mold in 2009.  FEV1 was shown to improve with inhaled albuterol.  Sometimes she wheezes and needs an inhaler.  Had coronary angiography by Dr. [name] in March 2020.  Had mild eccentric 40 to 60% mid vessel stenosis of the LAD.  Coronaries were otherwise normal.  Ejection fraction 65%.  Was placed on high-dose statin medication.  Family history: Father deceased with history of stroke, hypertension, diabetes and COPD.  Mother deceased with history of congestive heart failure, chronic kidney disease.  1 sister and 1 son in good health.  Patient has been a patient in this practice since September 1996.  Social history: She is divorced.  Does not smoke.  Occasional alcohol consumption.  Longstanding history of morbid obesity.  Weight in 1996 was 307 pounds.  Current weight is 296 pounds with BMI 49.26.  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  Regarding diabetes-hemoglobin A1c is good at  6.3%.  Fasting glucose 135.    Review of Systems difficulty ambulating due to osteoarthritis in her knees.  No issues with chronic anticoagulation.  Fatigues easily.     Objective:   Physical Exam Vital signs  reviewed.  She is having difficulty ambulating I think because of knee pain.  Skin warm and dry.  No cervical adenopathy.  Chest clear.  No carotid bruits.  Breast without masses.  Cardiac exam regular rate and rhythm normal S1 and S2.  Abdomen obese without hepatosplenomegaly masses or significant tenderness.  Trace lower extremity edema.  Neuro intact without gross focal deficits.  She is alert and oriented x3.       Assessment & Plan:   Morbid obesity-continue to encourage diet exercise and weight loss.  Not really able to exercise much due to knee pain  Health maintenance- needs pneumococcal 23 and was given this today  Left knee pain-likely has bilateral osteoarthritis of both knees but does not want to have surgery  Sleep apnea-has CPAP  Allergic rhinitis treated with Singulair and Zyrtec  Insomnia treated with Ambien  History of migraine headaches and depression treated with is a real.  Also takes Lexapro for depression.  Anxiety treated with Xanax  GE reflux treated with Nexium  History of colitis with last colonoscopy 2014 was negative except for severe diverticulosis  History of saddle pulmonary embolus and is on chronic anticoagulation.  Had cor pulmonale with that episode is followed by Dr. Pernell Dupre and he has had recent cardiac cath with results above.  Diabetes mellitus-stable  Hyperlipidemia-stable  Hypothyroidism-on thyroid replacement medication  History of asthma treated with inhalers  History of oral lichen planus  Plan: She will return in a couple of weeks well-hydrated for follow-up of elevated serum creatinine.  She will also have ultrasound of her kidneys to rule out obstruction.  She will have repeat basic metabolic panel when she returns.  Subjective:   Patient presents for Medicare Annual/Subsequent preventive examination.  Review Past Medical/Family/Social: See above   Risk Factors  Current exercise habits: Sedentary-difficult for her to  ambulate Dietary issues discussed: Low-fat low carbohydrate  Cardiac risk factors: Glucose intolerance, family history, history of nonobstructive coronary disease, hyperlipidemia  Depression Screen  (Note: if answer to either of the following is "Yes", a more complete depression screening is indicated)   Over the past two weeks, have you felt down, depressed or hopeless? No  Over the past two weeks, have you felt little interest or pleasure in doing things? No Have you lost interest or pleasure in daily life? No Do you often feel hopeless? No Do you cry easily over simple problems? No   Activities of Daily Living  In your present state of health, do you have any difficulty performing the following activities?:   Driving? No  Managing money? No  Feeding yourself? No  Getting from bed to chair? No  Climbing a flight of stairs?  Yes due to ambulation issues from osteoarthritis Preparing food and eating?: No  Bathing or showering? No  Getting dressed: No  Getting to the toilet? No  Using the toilet:No  Moving around from place to place: Yes sometimes due to osteoarthritis In the past year have you fallen or had a near fall?:No  Are you sexually active? No  Do you have more than one partner? No   Hearing Difficulties: No  Do you often ask people to speak up or repeat themselves? No  Do you experience ringing or noises  in your ears? No  Do you have difficulty understanding soft or whispered voices? No  Do you feel that you have a problem with memory? No Do you often misplace items? No    Home Safety:  Do you have a smoke alarm at your residence? Yes Do you have grab bars in the bathroom?  Yes  do you have throw rugs in your house?-No have area rugs  Cognitive Testing  Alert? Yes Normal Appearance?Yes  Oriented to person? Yes Place? Yes  Time? Yes  Recall of three objects? Yes  Can perform simple calculations? Yes  Displays appropriate judgment?Yes  Can read the correct  time from a watch face?Yes   List the Names of Other Physician/Practitioners you currently use:  See referral list for the physicians patient is currently seeing.  Dr. Pernell Dupre   Review of Systems: See above   Objective:     General appearance: Appears stated age and obese  Head: Normocephalic, without obvious abnormality, atraumatic  Eyes: conj clear, EOMi PEERLA  Ears: normal TM's and external ear canals both ears  Nose: Nares normal. Septum midline. Mucosa normal. No drainage or sinus tenderness.  Throat: lips, mucosa, and tongue normal; teeth and gums normal  Neck: no adenopathy, no carotid bruit, no JVD, supple, symmetrical, trachea midline and thyroid not enlarged, symmetric, no tenderness/mass/nodules  No CVA tenderness.  Lungs: clear to auscultation bilaterally  Breasts: normal appearance, no masses or tenderness Heart: regular rate and rhythm, S1, S2 normal, no murmur, click, rub or gallop  Abdomen: soft, non-tender; bowel sounds normal; no masses, no organomegaly  Musculoskeletal: ROM normal in all joints, no crepitus, no deformity, Normal muscle strengthen. Back  is symmetric, no curvature. Skin: Skin color, texture, turgor normal. No rashes or lesions  Lymph nodes: Cervical, supraclavicular, and axillary nodes normal.  Neurologic: CN 2 -12 Normal, Normal symmetric reflexes. Normal coordination and gait  Psych: Alert & Oriented x 3, Mood appear stable.    Assessment:    Annual wellness medicare exam   Plan:    During the course of the visit the patient was educated and counseled about appropriate screening and preventive services including:   Annual flu vaccine     Patient Instructions (the written plan) was given to the patient.  Medicare Attestation  I have personally reviewed:  The patient's medical and social history  Their use of alcohol, tobacco or illicit drugs  Their current medications and supplements  The patient's functional ability including  ADLs,fall risks, home safety risks, cognitive, and hearing and visual impairment  Diet and physical activities  Evidence for depression or mood disorders  The patient's weight, height, BMI, and visual acuity have been recorded in the chart. I have made referrals, counseling, and provided education to the patient based on review of the above and I have provided the patient with a written personalized care plan for preventive services.

## 2019-09-17 ENCOUNTER — Ambulatory Visit
Admission: RE | Admit: 2019-09-17 | Discharge: 2019-09-17 | Disposition: A | Discharge status: Home / Self Care | Payer: Medicare Other | Source: Ambulatory Visit | Attending: Internal Medicine | Admitting: Internal Medicine

## 2019-09-17 ENCOUNTER — Other Ambulatory Visit: Payer: Self-pay

## 2019-09-17 DIAGNOSIS — R7989 Other specified abnormal findings of blood chemistry: Secondary | ICD-10-CM

## 2019-09-17 DIAGNOSIS — N281 Cyst of kidney, acquired: Secondary | ICD-10-CM | POA: Diagnosis not present

## 2019-09-17 LAB — COMPLETE METABOLIC PANEL WITH GFR
AG Ratio: 1.2 (calc) (ref 1.0–2.5)
ALT: 11 U/L (ref 6–29)
AST: 12 U/L (ref 10–35)
Albumin: 3.7 g/dL (ref 3.6–5.1)
Alkaline phosphatase (APISO): 78 U/L (ref 37–153)
BUN/Creatinine Ratio: 19 (calc) (ref 6–22)
BUN: 24 mg/dL (ref 7–25)
CO2: 27 mmol/L (ref 20–32)
Calcium: 9.3 mg/dL (ref 8.6–10.4)
Chloride: 103 mmol/L (ref 98–110)
Creat: 1.29 mg/dL — ABNORMAL HIGH (ref 0.50–0.99)
GFR, Est African American: 50 mL/min/{1.73_m2} — ABNORMAL LOW (ref >=60)
GFR, Est Non African American: 43 mL/min/{1.73_m2} — ABNORMAL LOW (ref >=60)
Globulin: 3.1 g/dL (calc) (ref 1.9–3.7)
Glucose, Bld: 135 mg/dL — ABNORMAL HIGH (ref 65–99)
Potassium: 4.7 mmol/L (ref 3.5–5.3)
Sodium: 143 mmol/L (ref 135–146)
Total Bilirubin: 0.4 mg/dL (ref 0.2–1.2)
Total Protein: 6.8 g/dL (ref 6.1–8.1)

## 2019-09-17 LAB — CBC WITH DIFFERENTIAL/PLATELET
Absolute Monocytes: 752 cells/uL (ref 200–950)
Basophils Absolute: 103 cells/uL (ref 0–200)
Basophils Relative: 1.1 %
Eosinophils Absolute: 432 cells/uL (ref 15–500)
Eosinophils Relative: 4.6 %
HCT: 39.4 % (ref 35.0–45.0)
Hemoglobin: 12.7 g/dL (ref 11.7–15.5)
Lymphs Abs: 3149 cells/uL (ref 850–3900)
MCH: 27.5 pg (ref 27.0–33.0)
MCHC: 32.2 g/dL (ref 32.0–36.0)
MCV: 85.3 fL (ref 80.0–100.0)
MPV: 11.7 fL (ref 7.5–12.5)
Monocytes Relative: 8 %
Neutro Abs: 4963 cells/uL (ref 1500–7800)
Neutrophils Relative %: 52.8 %
Platelets: 278 10*3/uL (ref 140–400)
RBC: 4.62 10*6/uL (ref 3.80–5.10)
RDW: 13.2 % (ref 11.0–15.0)
Total Lymphocyte: 33.5 %
WBC: 9.4 10*3/uL (ref 3.8–10.8)

## 2019-09-17 LAB — HEMOGLOBIN A1C
Hgb A1c MFr Bld: 6.3 % of total Hgb — ABNORMAL HIGH (ref <5.7)
Mean Plasma Glucose: 134 (calc)
eAG (mmol/L): 7.4 (calc)

## 2019-09-17 LAB — MICROALBUMIN / CREATININE URINE RATIO
Creatinine, Urine: 237 mg/dL (ref 20–275)
Microalb Creat Ratio: 15 mcg/mg creat (ref <30)
Microalb, Ur: 3.6 mg/dL

## 2019-09-17 LAB — LIPID PANEL
Cholesterol: 114 mg/dL (ref <200)
HDL: 41 mg/dL — ABNORMAL LOW (ref >=50)
LDL Cholesterol (Calc): 54 mg/dL (calc)
Non-HDL Cholesterol (Calc): 73 mg/dL (calc) (ref <130)
Total CHOL/HDL Ratio: 2.8 (calc) (ref <5.0)
Triglycerides: 103 mg/dL (ref <150)

## 2019-09-17 LAB — TSH: TSH: 0.55 mIU/L (ref 0.40–4.50)

## 2019-09-22 ENCOUNTER — Other Ambulatory Visit: Payer: Self-pay | Admitting: Nurse Practitioner

## 2019-09-23 ENCOUNTER — Other Ambulatory Visit: Payer: Self-pay

## 2019-09-23 ENCOUNTER — Encounter: Payer: Self-pay | Admitting: Internal Medicine

## 2019-09-23 ENCOUNTER — Ambulatory Visit (INDEPENDENT_AMBULATORY_CARE_PROVIDER_SITE_OTHER): Payer: Medicare Other | Admitting: Internal Medicine

## 2019-09-23 VITALS — BP 110/80 | HR 71 | Temp 98.1°F | Ht 65.0 in | Wt 296.0 lb

## 2019-09-23 DIAGNOSIS — I259 Chronic ischemic heart disease, unspecified: Secondary | ICD-10-CM | POA: Diagnosis not present

## 2019-09-23 DIAGNOSIS — R7989 Other specified abnormal findings of blood chemistry: Secondary | ICD-10-CM | POA: Diagnosis not present

## 2019-09-23 DIAGNOSIS — I10 Essential (primary) hypertension: Secondary | ICD-10-CM

## 2019-09-23 LAB — BASIC METABOLIC PANEL
BUN/Creatinine Ratio: 15 (calc) (ref 6–22)
BUN: 16 mg/dL (ref 7–25)
CO2: 31 mmol/L (ref 20–32)
Calcium: 9.2 mg/dL (ref 8.6–10.4)
Chloride: 101 mmol/L (ref 98–110)
Creat: 1.09 mg/dL — ABNORMAL HIGH (ref 0.50–0.99)
Glucose, Bld: 124 mg/dL — ABNORMAL HIGH (ref 65–99)
Potassium: 4.5 mmol/L (ref 3.5–5.3)
Sodium: 140 mmol/L (ref 135–146)

## 2019-09-23 NOTE — Patient Instructions (Addendum)
Basic metabolic panel drawn to follow-up on recent elevated serum creatinine.  Level is much improved.  Follow-up in 6 months.  No obstruction on renal ultrasound.

## 2019-09-23 NOTE — Progress Notes (Signed)
   Subjective:    Patient ID: [name], female    DOB: [birth year], 68 y.o.   MRN: [id number]  HPI 68 year old Female has had kidney ultrasound recently due to elevated serum creatinine. Has doubled Lasix from 20mg  to 40 mg  in September due to SOB and she thought she was in CHF. Legs were swelling.  Is here today to discuss results of ultrasound.  There is no hydronephrosis.  There is a cyst in the lower pole of the right kidney measuring 3.5 x 3.1 x 3.8 cm.  History of nonobstructing kidney stone on right 2014.  When she was here November 2, her creatinine was elevated at 1.29.  Her creatinine is now much improved at 1.09 and stable from reading in May.  We will continue to monitor for development of possible chronic kidney disease with longstanding hypertension.    Review of Systems see above     Objective:   Physical Exam BP 110/80 pulse 71 pulse ox 92% BMI 49.26 Chest clear.  Cardiac exam regular rate and rhythm.  Trace lower extremity edema.      Assessment & Plan:  Elevated serum creatinine-improved over the past week from 1.29 to 1.09 with no evidence of obstruction on renal ultrasound.  Continue to monitor creatinine with no change in medications now.  She will follow-up in 6 months.

## 2019-10-02 ENCOUNTER — Telehealth: Payer: Self-pay | Admitting: Internal Medicine

## 2019-10-02 MED ORDER — OLMESARTAN MEDOXOMIL 40 MG PO TABS: 40.0000 mg | ORAL_TABLET | Freq: Every day | ORAL | 3 refills | Status: DC

## 2019-10-02 NOTE — Telephone Encounter (Signed)
Received Fax RX request from  Pharmacy -  [id number] - [place], [place] - [address] [phone] (Phone) [phone] (Fax)     Medication - olmesartan (BENICAR) 40 MG tablet    Last Refill -   Last OV - 09/23/19  Last CPE - 09/16/19

## 2019-10-04 ENCOUNTER — Encounter: Payer: Self-pay | Admitting: Pulmonary Disease

## 2019-10-04 ENCOUNTER — Ambulatory Visit (INDEPENDENT_AMBULATORY_CARE_PROVIDER_SITE_OTHER): Payer: Medicare Other | Admitting: Pulmonary Disease

## 2019-10-04 ENCOUNTER — Other Ambulatory Visit: Payer: Self-pay

## 2019-10-04 DIAGNOSIS — G4733 Obstructive sleep apnea (adult) (pediatric): Secondary | ICD-10-CM | POA: Diagnosis not present

## 2019-10-04 NOTE — Progress Notes (Signed)
[facility]  Chief Complaint  Patient presents with  . Follow-up    CPAP     Constitutional:  There were no vitals taken for this visit.  Deferred.  Past Medical History:  Anemia, Anxiety, Asthma, Depression, DM, Diverticulosis, GERD, HTN, Hypothyroidism, Migraine HA, Nephrolithiasis, PNA, PE March 2018  Brief Summary:  [name] is a 68 y.o. female with snoring.  Virtual Visit via Telephone Note  I connected with [name] on 10/04/19 at 11:45 AM EST by telephone and verified that I am speaking with the correct person using two identifiers.  Location: Patient: home Provider: medical office   I discussed the limitations, risks, security and privacy concerns of performing an evaluation and management service by telephone and the availability of in person appointments. I also discussed with the patient that there may be a patient responsible charge related to this service. The patient expressed understanding and agreed to proceed.  She is doing well with CPAP.  Has nasal pillows mask.  Pressure okay.  Not having sinus congestion, sore throat, dry mouth, or aerophagia.     Physical Exam:  Deferred  Assessment/Plan:   Obstructive sleep apnea. - she is compliant with CPAP and reports benefit from therapy - continue auto CPAP   Patient Instructions  Follow up in 1 year      I discussed the assessment and treatment plan with the patient. The patient was provided an opportunity to ask questions and all were answered. The patient agreed with the plan and demonstrated an understanding of the instructions.   The patient was advised to call back or seek an in-person evaluation if the symptoms worsen or if the condition fails to improve as anticipated.  I provided 12 minutes of non-face-to-face time during this encounter.   Chesley Mires, MD [facility] Pager: [phone] 10/04/2019,  12:20 PM  Flow Sheet     Pulmonary tests:  CT angio chest 01/19/17 >> multiple PE with RV:LV 1  Sleep tests:  HST 11/19/18 >> AHI 14.6, SpO2 low 73%.  Spent 270.4 min with SpO2 < 89%. Auto CPAP 09/04/19 to 10/03/19 >> used on 28 of 30 nights with average 5 hrs 43 min.  Average AHI 2 with median CPAP 7 and 95 th percentile CPAP 11 cm H2O  Cardiac tests:  Echo 07/31/17 >> EF 60 to 65%, grade 1 DD, mild MR, PAS 40 mmHg  Medications:   Allergies as of 10/04/2019      Reactions   Aspirin Shortness Of Breath   Lisinopril Anaphylaxis, Other (See Comments)   Possibly Lisinopril or Levaquin, pt was taking both at the time of reaction   Bee Venom Swelling   Morphine And Related Itching, Other (See Comments)   Needs benadryl prior to       Medication List       Accurate as of October 04, 2019 12:20 PM. If you have any questions, ask your nurse or doctor.        ALPRAZolam 0.5 MG tablet Commonly known as: XANAX TAKE 1 TABLET BY MOUTH TWICE A DAY AS NEEDED   atorvastatin 80 MG tablet Commonly known as: LIPITOR TAKE 1 TABLET BY MOUTH EVERY DAY AT 6:00 PM   cetirizine 10 MG tablet Commonly known as: ZYRTEC Take 10 mg by mouth at bedtime.   clobetasol ointment 0.05 % Commonly known as: TEMOVATE Apply 1 application topically 2 (two) times daily as needed (skin irritation).   clotrimazole 10  MG troche Commonly known as: MYCELEX Take 10 mg by mouth 3 (three) times daily as needed (candidiasis).   escitalopram 10 MG tablet Commonly known as: LEXAPRO TAKE 1 TABLET (10 MG TOTAL) DAILY BY MOUTH.   esomeprazole 40 MG capsule Commonly known as: NEXIUM Take 40 mg by mouth daily at 12 noon.   famotidine 10 MG tablet Commonly known as: PEPCID Take 10 mg by mouth at bedtime.   furosemide 20 MG tablet Commonly known as: LASIX TAKE 1 TABLET BY MOUTH EVERY DAY What changed:   how much to take  how to take this  when to take this   levothyroxine 200 MCG tablet Commonly  known as: SYNTHROID TAKE 1 TABLET BY MOUTH EVERY DAY ON EMPTY STOMACH BEFORE BREAKFAST   magnesium oxide 400 MG tablet Commonly known as: MAG-OX TAKE 1 TABLET BY MOUTH TWICE A DAY   metoprolol tartrate 25 MG tablet Commonly known as: LOPRESSOR TAKE 1 TABLET BY MOUTH EVERY DAY IN THE MORNING   montelukast 10 MG tablet Commonly known as: SINGULAIR Take 1 tablet (10 mg total) by mouth at bedtime.   nitroGLYCERIN 0.4 MG SL tablet Commonly known as: Nitrostat Place 1 tablet (0.4 mg total) under the tongue every 5 (five) minutes as needed for chest pain.   olmesartan 40 MG tablet Commonly known as: BENICAR Take 1 tablet (40 mg total) by mouth daily.   Oscimin SR 0.375 MG 12 hr tablet Generic drug: hyoscyamine Take 0.357 mg by mouth daily.   traZODone 150 MG tablet Commonly known as: DESYREL TAKE 1 TABLET (150 MG TOTAL) BY MOUTH AT BEDTIME.   Ventolin HFA 108 (90 Base) MCG/ACT inhaler Generic drug: albuterol USE 2 PUFFS EVERY 6 HOURS AS NEEDED FOR WHEEZING   Vitamin D3 125 MCG (5000 UT) Caps Take 5,000 Units by mouth daily.   Xarelto 20 MG Tabs tablet Generic drug: rivaroxaban TAKE 1 TABLET (20 MG TOTAL) BY MOUTH DAILY WITH SUPPER.   zolpidem 10 MG tablet Commonly known as: AMBIEN TAKE 1 TABLET (10 MG TOTAL) BY MOUTH AT BEDTIME AS NEEDED.       Past Surgical History:  She  has a past surgical history that includes Cesarean section; Abdominal hysterectomy; Knee cartilage surgery; Cataract extraction, bilateral; Colonoscopy; Esophagogastroduodenoscopy endoscopy; right foot surgery (little toe and next toe); Laparoscopic partial colectomy (N/A, 04/22/2013); Shoulder arthroscopy with rotator cuff repair and subacromial decompression (Right, 12/05/2014); LEFT HEART CATH AND CORONARY ANGIOGRAPHY (N/A, 01/14/2019); and INTRAVASCULAR PRESSURE WIRE/FFR STUDY (N/A, 01/14/2019).  Family History:  Her family history includes Breast cancer in her paternal aunt; Diabetes in her father; Heart  disease in her mother; Kidney failure in her father.  Social History:  She  reports that she quit smoking about 36 years ago. Her smoking use included cigarettes. She has a 3.00 pack-year smoking history. She has never used smokeless tobacco. She reports that she does not drink alcohol or use drugs.

## 2019-10-04 NOTE — Patient Instructions (Signed)
Follow up in 1 year.

## 2019-10-07 ENCOUNTER — Other Ambulatory Visit: Payer: Self-pay

## 2019-10-07 ENCOUNTER — Telehealth (INDEPENDENT_AMBULATORY_CARE_PROVIDER_SITE_OTHER): Payer: Medicare Other | Admitting: Cardiology

## 2019-10-07 ENCOUNTER — Encounter: Payer: Self-pay | Admitting: Cardiology

## 2019-10-07 VITALS — BP 131/80 | HR 63 | Ht 64.0 in | Wt 300.0 lb

## 2019-10-07 DIAGNOSIS — I10 Essential (primary) hypertension: Secondary | ICD-10-CM

## 2019-10-07 DIAGNOSIS — E1100 Type 2 diabetes mellitus with hyperosmolarity without nonketotic hyperglycemic-hyperosmolar coma (NKHHC): Secondary | ICD-10-CM

## 2019-10-07 DIAGNOSIS — I25118 Atherosclerotic heart disease of native coronary artery with other forms of angina pectoris: Secondary | ICD-10-CM

## 2019-10-07 DIAGNOSIS — G4733 Obstructive sleep apnea (adult) (pediatric): Secondary | ICD-10-CM

## 2019-10-07 DIAGNOSIS — I2729 Other secondary pulmonary hypertension: Secondary | ICD-10-CM

## 2019-10-07 NOTE — Patient Instructions (Signed)
Medication Instructions:  Your physician recommends that you continue on your current medications as directed. Please refer to the Current Medication list given to you today.  *If you need a refill on your cardiac medications before your next appointment, please call your pharmacy*  Lab Work: None   If you have labs (blood work) drawn today and your tests are completely normal, you will receive your results only by: Marland Kitchen MyChart Message (if you have MyChart) OR . A paper copy in the mail If you have any lab test that is abnormal or we need to change your treatment, we will call you to review the results.  Testing/Procedures: None  Follow-Up: At [facility], you and your health needs are our priority.  As part of our continuing mission to provide you with exceptional heart care, we have created designated Provider Care Teams.  These Care Teams include your primary Cardiologist (physician) and Advanced Practice Providers (APPs -  Physician Assistants and Nurse Practitioners) who all work together to provide you with the care you need, when you need it.  Your next appointment:   6 month(s)  The format for your next appointment:   In Person  Provider:   You may see Sinclair Grooms, MD  or one of the following Advanced Practice Providers on your designated Care Team:    Melina Copa, PA-C  Ermalinda Barrios, PA-C   Other Instructions

## 2019-10-07 NOTE — Progress Notes (Signed)
Virtual Visit via Telephone Note   This visit type was conducted due to national recommendations for restrictions regarding the COVID-19 Pandemic (e.g. social distancing) in an effort to limit this patient's exposure and mitigate transmission in our community.  Due to her co-morbid illnesses, this patient is at least at moderate risk for complications without adequate follow up.  This format is felt to be most appropriate for this patient at this time.  The patient did not have access to video technology/had technical difficulties with video requiring transitioning to audio format only (telephone).  All issues noted in this document were discussed and addressed.  No physical exam could be performed with this format.  Please refer to the patient's chart for her  consent to telehealth for [facility].   Date:  10/07/2019   ID:  [name], DOB 06-Jul-1951, MRN [id number]  Patient Location: Home Provider Location: Office  PCP:  Elby Showers, MD  Cardiologist:  Sinclair Grooms, MD  Electrophysiologist:  None  Sleep MD: Dr. Halford Chessman   Evaluation Performed:  Follow-Up Visit  Chief Complaint:  CAD  History of Present Illness:    [name] is a 68 y.o. female with a hx of obesity, hypertension, DM II, CAD (60-70% mid LAD), and thyroid disease.  She is retired from the [employer] lab.  Isosorbide caused headache initially but that is resolved.  Dyspnea on exertion occurs.  She refuses oxygen.  She has moderately severe pulmonary hypertension.  She has not needed to use nitroglycerin.  Pulmonary hypertension likely secondary to obesity hypoventilation and pulmonary embolism with documented RVE on recent 2 D doppler echocardiogram/efuses to  use oxygen. She is compliant with C-PAP. Target BP 130/80   Today she is feeling well.  She has DOE so somewhat limited in activity.  She again is not ready to have home oxygen and she tells me last sats with PCP were good though she did  not walk.  She has seen Dr. Halford Chessman for sleep apnea and wears every night.  No bleeding with xarelto.    Recent LDL is 54 and HDL 41    The patient does not have symptoms concerning for COVID-19 infection (fever, chills, cough, or new shortness of breath).    Past Medical History:  Diagnosis Date  . Anemia    none recently  . Anxiety   . Arthritis   . Asthma   . Complete tear of right rotator cuff 12/05/2014  . Depression   . Diabetes mellitus    not on meds.denies a1c 5.8  . Diverticulitis   . Diverticulosis   . Family history of adverse reaction to anesthesia    mom  nausea and vomiting  . GERD (gastroesophageal reflux disease)   . Hypertension   . Hypothyroidism   . Migraine   . Personal history of kidney stones   . Pneumonia    hx  . Pulmonary embolism ([place])    a. diagnosed in 01/2017 w/ imaging showing multiple right lung PE with right heart strain. Started on Xarelto.   . Shortness of breath dyspnea    exersion  . Thyroid disease    hypo  . Wears glasses    Past Surgical History:  Procedure Laterality Date  . ABDOMINAL HYSTERECTOMY    . CATARACT EXTRACTION, BILATERAL    . CESAREAN SECTION    . COLONOSCOPY     multiple   . ESOPHAGOGASTRODUODENOSCOPY ENDOSCOPY     multiple  . INTRAVASCULAR PRESSURE  WIRE/FFR STUDY N/A 01/14/2019   Procedure: INTRAVASCULAR PRESSURE WIRE/FFR STUDY;  Surgeon: Belva Crome, MD;  Location: [facility];  Service: Cardiovascular;  Laterality: N/A;  . KNEE CARTILAGE SURGERY     Left  . LAPAROSCOPIC PARTIAL COLECTOMY N/A 04/22/2013   Procedure: LAPAROSCOPIC PARTIAL COLECTOMY;  Surgeon: Odis Hollingshead, MD;  Location: [facility];  Service: General;  Laterality: N/A;  . LEFT HEART CATH AND CORONARY ANGIOGRAPHY N/A 01/14/2019   Procedure: LEFT HEART CATH AND CORONARY ANGIOGRAPHY;  Surgeon: Belva Crome, MD;  Location: [facility];  Service: Cardiovascular;  Laterality: N/A;  . right foot surgery  little toe and next toe   x 3  .  SHOULDER ARTHROSCOPY WITH ROTATOR CUFF REPAIR AND SUBACROMIAL DECOMPRESSION Right 12/05/2014   Procedure: RIGHT SHOULDER ARTHROSCOPY,ACROMOPLASTY, ROTATOR CUFF REPAIR;  Surgeon: [name], MD;  Location: [facility];  Service: Orthopedics;  Laterality: Right;     Current Meds  Medication Sig  . ALPRAZolam (XANAX) 0.5 MG tablet TAKE 1 TABLET BY MOUTH TWICE A DAY AS NEEDED  . atorvastatin (LIPITOR) 80 MG tablet TAKE 1 TABLET BY MOUTH EVERY DAY AT 6:00 PM  . cetirizine (ZYRTEC) 10 MG tablet Take 10 mg by mouth at bedtime.   . Cholecalciferol (VITAMIN D3) 5000 units CAPS Take 5,000 Units by mouth daily.  . clobetasol ointment (TEMOVATE) [id number] % Apply 1 application topically 2 (two) times daily as needed (skin irritation).  . clotrimazole (MYCELEX) 10 MG troche Take 10 mg by mouth 3 (three) times daily as needed (candidiasis).   Marland Kitchen escitalopram (LEXAPRO) 10 MG tablet TAKE 1 TABLET (10 MG TOTAL) DAILY BY MOUTH.  Marland Kitchen esomeprazole (NEXIUM) 40 MG capsule Take 40 mg by mouth daily at 12 noon.  . famotidine (PEPCID) 10 MG tablet Take 10 mg by mouth at bedtime.  . furosemide (LASIX) 20 MG tablet TAKE 1 TABLET BY MOUTH EVERY DAY (Patient taking differently: 40 mg. )  . levothyroxine (SYNTHROID) 200 MCG tablet TAKE 1 TABLET BY MOUTH EVERY DAY ON EMPTY STOMACH BEFORE BREAKFAST  . magnesium oxide (MAG-OX) 400 MG tablet TAKE 1 TABLET BY MOUTH TWICE A DAY  . metoprolol tartrate (LOPRESSOR) 25 MG tablet TAKE 1 TABLET BY MOUTH EVERY DAY IN THE MORNING  . montelukast (SINGULAIR) 10 MG tablet Take 1 tablet (10 mg total) by mouth at bedtime.  . nitroGLYCERIN (NITROSTAT) 0.4 MG SL tablet Place 1 tablet (0.4 mg total) under the tongue every 5 (five) minutes as needed for chest pain.  Marland Kitchen olmesartan (BENICAR) 40 MG tablet Take 1 tablet (40 mg total) by mouth daily.  Jiles Prows SR 0.375 MG 12 hr tablet Take 0.357 mg by mouth daily.  . traZODone (DESYREL) 150 MG tablet TAKE 1 TABLET (150 MG TOTAL) BY MOUTH AT  BEDTIME.  . VENTOLIN HFA 108 (90 Base) MCG/ACT inhaler USE 2 PUFFS EVERY 6 HOURS AS NEEDED FOR WHEEZING  . XARELTO 20 MG TABS tablet TAKE 1 TABLET (20 MG TOTAL) BY MOUTH DAILY WITH SUPPER.  Marland Kitchen zolpidem (AMBIEN) 10 MG tablet TAKE 1 TABLET (10 MG TOTAL) BY MOUTH AT BEDTIME AS NEEDED.     Allergies:   Aspirin, Lisinopril, Bee venom, and Morphine and related   Social History   Tobacco Use  . Smoking status: Former Smoker    Packs/day: 0.50    Years: 6.00    Pack years: 3.00    Types: Cigarettes    Quit date: 11/14/1982    Years since  quitting: 36.9  . Smokeless tobacco: Never Used  . Tobacco comment: quit smoking 30 years ago  Substance Use Topics  . Alcohol use: No  . Drug use: No     Family Hx: The patient's family history includes Breast cancer in her paternal aunt; Diabetes in her father; Heart disease in her mother; Kidney failure in her father. There is no history of Colon cancer.  ROS:   Please see the history of present illness.    General:no colds or fevers, no weight changes Skin:no rashes or ulcers HEENT:no blurred vision, no congestion CV:see HPI PUL:see HPI GI:no diarrhea constipation or melena, no indigestion GU:no hematuria, no dysuria MS:no joint pain, no claudication Neuro:no syncope, no lightheadedness Endo:+ diabetes, + thyroid disease  All other systems reviewed and are negative.   Prior CV studies:   The following studies were reviewed today:  Echo 01/14/19 IMPRESSIONS    1. The left ventricle has normal systolic function with an ejection fraction of 60-65%. The cavity size was normal. Left ventricular diastolic parameters were normal.  2. The right ventricle has mildly reduced systolic function. The cavity was moderately enlarged. There is no increase in right ventricular wall thickness. Right ventricular systolic pressure is severely elevated with an estimated pressure of 71.7 mmHg.  3. The mitral valve is normal in structure. There is mild mitral  annular calcification present.  4. The tricuspid valve is normal in structure.  5. The aortic valve is tricuspid.  6. The pulmonic valve was normal in structure.  7. The inferior vena cava was dilated in size with >50% respiratory variability.  8. The interatrial septum appears to be lipomatous.  FINDINGS  Left Ventricle: The left ventricle has normal systolic function, with an ejection fraction of 60-65%. The cavity size was normal. There is no increase in left ventricular wall thickness. Left ventricular diastolic parameters were normal Normal left  ventricular filling pressures Definity contrast agent was given IV to delineate the left ventricular endocardial borders. Right Ventricle: The right ventricle has mildly reduced systolic function. The cavity was moderately enlarged. There is no increase in right ventricular wall thickness. Right ventricular systolic pressure is severely elevated with an estimated pressure  of 71.7 mmHg. Left Atrium: left atrial size was normal in size Right Atrium: right atrial size was normal in size. Right atrial pressure is estimated at 8 mmHg. Interatrial Septum: No atrial level shunt detected by color flow Doppler. Increased thickness of the atrial septum sparing the fossa ovalis consistent with The interatrial septum appears to be lipomatous. Pericardium: There is no evidence of pericardial effusion. Mitral Valve: The mitral valve is normal in structure. There is mild mitral annular calcification present. Mitral valve regurgitation is mild by color flow Doppler. Tricuspid Valve: The tricuspid valve is normal in structure. Tricuspid valve regurgitation is mild by color flow Doppler. Aortic Valve: The aortic valve is tricuspid Aortic valve regurgitation was not visualized by color flow Doppler. Pulmonic Valve: The pulmonic valve was normal in structure. Pulmonic valve regurgitation is not visualized by color flow Doppler. Venous: The inferior vena cava measures  2.47 cm, is dilated in size with greater than 50% respiratory variability.   LEFT VENTRICLE PLAX 2D (Teich) LV EF:          60.9 %   Diastology LVIDd:          4.90 cm  LV e' lateral:   10.80 cm/s LVIDs:          3.30 cm  LV E/e'  lateral: 8.5 LV PW:          1.00 cm  LV e' medial:    6.96 cm/s LV IVS:         0.90 cm  LV E/e' medial:  13.2 LVOT diam:      1.80 cm LV SV:          69 ml LVOT Area:      2.54 cm  RIGHT VENTRICLE RV S prime:     11.20 cm/s TAPSE (M-mode): 2.6 cm RVSP:           71.7 mmHg  LEFT ATRIUM             Index       RIGHT ATRIUM           Index LA diam:        4.20 cm 1.77 cm/m  RA Pressure: 8 mmHg LA Vol (A2C):   39.2 ml 16.54 ml/m RA Area:     19.90 cm LA Vol (A4C):   52.4 ml 22.11 ml/m RA Volume:   58.40 ml  24.64 ml/m LA Biplane Vol: 50.2 ml 21.18 ml/m  AORTIC VALVE LVOT Vmax:   138.00 cm/s LVOT Vmean:  97.600 cm/s LVOT VTI:    0.308 m   AORTA Ao Root diam: 2.80 cm  MITRAL VALVE              TRICUSPID VALVE MV Area (PHT): 3.37 cm   TR Peak grad:   63.7 mmHg MV PHT:        65.25 msec TR Vmax:        399.00 cm/s MV Decel Time: 225 msec   RVSP:           71.7 mmHg MV E velocity: 91.90 cm/s MV A velocity: 74.90 cm/s MV E/A ratio:  1.23  IVC IVC diam: 2.47 cm   Cardiac cath 01/14/19  60 to 70% mid LAD with both DFR and FFR (0.91 and 0.84 respectively) documenting absence of hemodynamic significance.  Normal left main  Normal circumflex  Normal RCA  Normal LV function and LVEDP.  EF 65%  RECOMMENDATIONS:   Possible allergy to aspirin  Aggressive risk factor modification.  Started high intensity statin therapy.  Other aggressive secondary risk modification measures will also be stressed.  Resume Xarelto greater than 12 hours after sheath pull from radial artery  Needs clinical follow-up and if has convincing symptoms of angina in the future, the LAD would be reasonably easy to treat with percutaneous therapy.   Labs/Other  Tests and Data Reviewed:    EKG:  An ECG dated 01/16/19 was personally reviewed today and demonstrated:  SR 1st degree AVB PR 230 ms non specific ST abnromality but no acute changes   Recent Labs: 03/19/2019: Magnesium 1.6 09/16/2019: ALT 11; Hemoglobin 12.7; Platelets 278; TSH 0.55 09/23/2019: BUN 16; Creat 1.09; Potassium 4.5; Sodium 140   Recent Lipid Panel Lab Results  Component Value Date/Time   CHOL 114 09/16/2019 11:54 AM   CHOL 102 05/08/2019 10:13 AM   TRIG 103 09/16/2019 11:54 AM   HDL 41 (L) 09/16/2019 11:54 AM   HDL 43 05/08/2019 10:13 AM   CHOLHDL 2.8 09/16/2019 11:54 AM   LDLCALC 54 09/16/2019 11:54 AM    Wt Readings from Last 3 Encounters:  10/07/19 300 lb (136.1 kg)  09/23/19 296 lb (134.3 kg)  09/16/19 296 lb (134.3 kg)     Objective:    Vital Signs:  BP 131/80  Pulse 63   Ht 5\' 4"  (1.626 m)   Wt 300 lb (136.1 kg)   BMI 51.49 kg/m    VITAL SIGNS:  reviewed  General female in NAD Pulmonary can speak in complete sentences without SOB Neuro A&O X 3 follows commands and answers questions approp  ASSESSMENT & PLAN:    CAD and no ongoing angina continue current meds.  Last cath 01/2019 with 60-70% mLAD disease but FFR and DFR revealed no hemodynamic significance.    HTN BP at goal continue current meds  Secondary Pulmonary HTN - she is not ready for home O2, difficult to exercise due to SOB   OSA followed by Pulmonary and wears CPAP at night  DM-2 per PCP   COVID-19 Education: The signs and symptoms of COVID-19 were discussed with the patient and how to seek care for testing (follow up with PCP or arrange E-visit).  The importance of social distancing was discussed today.  Time:   Today, I have spent 10 minutes with the patient with telehealth technology discussing the above problems.     Medication Adjustments/Labs and Tests Ordered: Current medicines are reviewed at length with the patient today.  Concerns regarding medicines are outlined above.    Tests Ordered: No orders of the defined types were placed in this encounter.   Medication Changes: No orders of the defined types were placed in this encounter.   Follow Up:  In Person in 6 month(s)  Signed, Cecilie Kicks, NP  10/07/2019 3:02 PM    [facility]

## 2019-10-09 ENCOUNTER — Encounter: Payer: Self-pay | Admitting: Cardiology

## 2019-10-13 NOTE — Patient Instructions (Addendum)
Follow-up on elevated serum creatinine with repeat creatinine when well-hydrated and have ultrasound of kidneys to rule out obstruction.  Pneumococcal 23 vaccine given today.  Continue current medications.  Follow-up after ultrasound has been performed.  63-month recheck due in May

## 2019-12-06 ENCOUNTER — Other Ambulatory Visit: Payer: Self-pay

## 2019-12-06 MED ORDER — EPINEPHRINE 0.3 MG/0.3ML IJ SOAJ: 0.3000 mg | INTRAMUSCULAR | 11 refills | Status: AC | PRN

## 2019-12-19 ENCOUNTER — Other Ambulatory Visit: Payer: Self-pay | Admitting: Internal Medicine

## 2019-12-19 ENCOUNTER — Other Ambulatory Visit: Payer: Self-pay | Admitting: Interventional Cardiology

## 2019-12-19 NOTE — Telephone Encounter (Signed)
Prescription refill request for Xarelto received.   Last office visit: 10/07/2019 Weight: 136.1 kg Age: 69 y.o. Scr: 1.09, 09/23/2019 CrCl: 108 ml/min   Prescription refill sent.

## 2019-12-25 DIAGNOSIS — H538 Other visual disturbances: Secondary | ICD-10-CM | POA: Diagnosis not present

## 2019-12-25 DIAGNOSIS — Z961 Presence of intraocular lens: Secondary | ICD-10-CM | POA: Diagnosis not present

## 2019-12-25 DIAGNOSIS — H04123 Dry eye syndrome of bilateral lacrimal glands: Secondary | ICD-10-CM | POA: Diagnosis not present

## 2019-12-25 DIAGNOSIS — H43813 Vitreous degeneration, bilateral: Secondary | ICD-10-CM | POA: Diagnosis not present

## 2020-01-10 DIAGNOSIS — Z23 Encounter for immunization: Secondary | ICD-10-CM | POA: Diagnosis not present

## 2020-01-19 ENCOUNTER — Other Ambulatory Visit: Payer: Self-pay | Admitting: Internal Medicine

## 2020-01-22 DIAGNOSIS — Z961 Presence of intraocular lens: Secondary | ICD-10-CM | POA: Diagnosis not present

## 2020-01-22 DIAGNOSIS — H35373 Puckering of macula, bilateral: Secondary | ICD-10-CM | POA: Diagnosis not present

## 2020-01-22 DIAGNOSIS — H35363 Drusen (degenerative) of macula, bilateral: Secondary | ICD-10-CM | POA: Diagnosis not present

## 2020-02-07 DIAGNOSIS — Z23 Encounter for immunization: Secondary | ICD-10-CM | POA: Diagnosis not present

## 2020-02-08 ENCOUNTER — Other Ambulatory Visit: Payer: Self-pay | Admitting: Internal Medicine

## 2020-03-16 ENCOUNTER — Ambulatory Visit (INDEPENDENT_AMBULATORY_CARE_PROVIDER_SITE_OTHER): Payer: Medicare Other | Admitting: Internal Medicine

## 2020-03-16 ENCOUNTER — Encounter: Payer: Self-pay | Admitting: Internal Medicine

## 2020-03-16 ENCOUNTER — Other Ambulatory Visit: Payer: Self-pay

## 2020-03-16 VITALS — BP 110/80 | HR 64 | Temp 98.3°F | Ht 65.0 in | Wt 306.0 lb

## 2020-03-16 DIAGNOSIS — E119 Type 2 diabetes mellitus without complications: Secondary | ICD-10-CM | POA: Diagnosis not present

## 2020-03-16 DIAGNOSIS — G4730 Sleep apnea, unspecified: Secondary | ICD-10-CM | POA: Diagnosis not present

## 2020-03-16 DIAGNOSIS — R0602 Shortness of breath: Secondary | ICD-10-CM | POA: Diagnosis not present

## 2020-03-16 DIAGNOSIS — I25118 Atherosclerotic heart disease of native coronary artery with other forms of angina pectoris: Secondary | ICD-10-CM | POA: Diagnosis not present

## 2020-03-16 NOTE — Progress Notes (Signed)
   Subjective:    Patient ID: [name], female    DOB: [birth year], 69 y.o.   MRN: [id number]  HPI 69 year old Female for 6 month recheck. Main complaint is SOB with minimal exertion. Wonders if she needs home oxygen. Says 40 mg lasix causes cramps in legs. About 3 weeks ago had more LE edema. Says she takes 40 mg 4 out of 7 days.  Has had both Covid vaccines. Has chronic knee pain limited exercise capability. Hx HTN, hypothyroidism, anxiety and depression, sleep apnea with CPAP device, chronic insomnia.  At last visit in November, Hgb AIC was 6.3%, TSH was normal, and Lipid panel was normal.  Has not taken Lasix this am. Creatinine was 1.29 in November. Was fasting then. Had one glass of water this am. Drives from [place] to office here in [place]. Had ultrasound of kidneys in November for elevated Creatinine which showed no obstruction.    Review of Systems complains of increasing SOB. Has pulse ox at home running in the low 90's and max is 94 with deep breaths. Able to do minimal chores  about the home. Still using C-Pap device.     Objective:   Physical Exam BP 110/80 pulse 64 T 98.3 Pulse 94% Weight 306 pounds.  Skin warm and dry.  Nodes none.  Neck is supple without JVD thyromegaly or carotid bruits.  Chest clear to auscultation.  Cardiac exam regular rate and rhythm.  No pitting edema of the lower extremities.  Ambulates slowly.       Assessment & Plan:  SOB-chest x-ray ordered.  BNP is stable at 26.  Increase Lasix from 20 to 40 mg daily.  It seems that patient never went for chest x-ray that was ordered with this visit.  Morbid obesity-unable to ambulate and exercise very well  Obstructive sleep apnea followed by pulmonary-Dr. Halford Chessman  Chronic kidney disease stage III-creatinine is 1.24 but patient is likely volume depleted.  Impaired glucose tolerance-stable.  Hemoglobin A1c 6.1%.  History of hypokalemia-currently not on potassium supplement.  Has stage  III chronic kidney disease.  Potassium is stable currently.  History of GE reflux treated with PPI  Hypothyroidism stable on thyroid replacement medication-200 mcg daily  Longstanding history of insomnia treated with Ambien  History of anxiety treated with Xanax  History of depression treated with Lexapro and trazodone  History of allergic rhinitis  History of asthma-patient not wheezing currently  Former smoker but quit about 36 years ago.  Coronary artery disease status post cardiac catheterization by Dr. [name] March 2020.  Had mild eccentric 40 to 60% mid vessel stenosis of the LAD.  Ejection fraction was 65%.  Was placed on high-dose statin medication.  Had 2D echocardiogram March 2020 showing ejection fraction of 60 to 65% with normal valves.  Had elevated right ventricular systolic pressure.  Oxygen was suggested by Cardiology but patient declined.  We are going to refer her to Cardiology for evaluation of hypoxia.    History of PE March 2018 with acute cor pulmonale and is on chronic anticoagulation.  History of pulmonary hypertension based on echocardiogram March 2020.  May need home oxygen.  Plan: She is to have chest x-ray.  Cardiology referral placed.  Increase Lasix from 20 to 40 mg daily.

## 2020-03-16 NOTE — Patient Instructions (Signed)
We will make Cardiology referral for you due to hypoxia and shortness of breath. Please have CXR. Labs drawn and are pending. RTC here in 6 months for Medicare Wellness, health maintenance, and evaluation of medical issues.

## 2020-03-17 LAB — CBC WITH DIFFERENTIAL/PLATELET
Absolute Monocytes: 778 cells/uL (ref 200–950)
Basophils Absolute: 101 cells/uL (ref 0–200)
Basophils Relative: 1 %
Eosinophils Absolute: 323 cells/uL (ref 15–500)
Eosinophils Relative: 3.2 %
HCT: 42.3 % (ref 35.0–45.0)
Hemoglobin: 12.9 g/dL (ref 11.7–15.5)
Lymphs Abs: 2838 cells/uL (ref 850–3900)
MCH: 26 pg — ABNORMAL LOW (ref 27.0–33.0)
MCHC: 30.5 g/dL — ABNORMAL LOW (ref 32.0–36.0)
MCV: 85.1 fL (ref 80.0–100.0)
MPV: 11.4 fL (ref 7.5–12.5)
Monocytes Relative: 7.7 %
Neutro Abs: 6060 cells/uL (ref 1500–7800)
Neutrophils Relative %: 60 %
Platelets: 339 10*3/uL (ref 140–400)
RBC: 4.97 10*6/uL (ref 3.80–5.10)
RDW: 13.2 % (ref 11.0–15.0)
Total Lymphocyte: 28.1 %
WBC: 10.1 10*3/uL (ref 3.8–10.8)

## 2020-03-17 LAB — HEMOGLOBIN A1C
Hgb A1c MFr Bld: 6.1 % of total Hgb — ABNORMAL HIGH (ref <5.7)
Mean Plasma Glucose: 128 (calc)
eAG (mmol/L): 7.1 (calc)

## 2020-03-17 LAB — COMPLETE METABOLIC PANEL WITH GFR
AG Ratio: 1.2 (calc) (ref 1.0–2.5)
ALT: 14 U/L (ref 6–29)
AST: 20 U/L (ref 10–35)
Albumin: 3.7 g/dL (ref 3.6–5.1)
Alkaline phosphatase (APISO): 104 U/L (ref 37–153)
BUN/Creatinine Ratio: 18 (calc) (ref 6–22)
BUN: 22 mg/dL (ref 7–25)
CO2: 32 mmol/L (ref 20–32)
Calcium: 9.5 mg/dL (ref 8.6–10.4)
Chloride: 101 mmol/L (ref 98–110)
Creat: 1.24 mg/dL — ABNORMAL HIGH (ref 0.50–0.99)
GFR, Est African American: 52 mL/min/{1.73_m2} — ABNORMAL LOW (ref >=60)
GFR, Est Non African American: 45 mL/min/{1.73_m2} — ABNORMAL LOW (ref >=60)
Globulin: 3.2 g/dL (calc) (ref 1.9–3.7)
Glucose, Bld: 120 mg/dL — ABNORMAL HIGH (ref 65–99)
Potassium: 4.9 mmol/L (ref 3.5–5.3)
Sodium: 143 mmol/L (ref 135–146)
Total Bilirubin: 0.6 mg/dL (ref 0.2–1.2)
Total Protein: 6.9 g/dL (ref 6.1–8.1)

## 2020-03-17 LAB — BRAIN NATRIURETIC PEPTIDE: Brain Natriuretic Peptide: 26 pg/mL (ref <100)

## 2020-03-17 NOTE — Progress Notes (Signed)
Cardiology Office Note   Date:  03/18/2020   ID:  Tammy, [name] [birth year], MRN [id number]  PCP:  Elby Showers, MD  Cardiologist:  Dr. [name]    Chief Complaint  Patient presents with  . Shortness of Breath    May need 02.        History of Present Illness: [name] is a 69 y.o. female who presents for  + SOB takes 40 mg 4 out of 7 days. Recently saw PCP.   hx of obesity, hypertension, DM II, CAD (60-70% mid LAD), and thyroid disease.  She is retired from the [employer] lab.  Isosorbide caused headache initially but that is resolved. Dyspnea on exertion occurs. She refuses oxygen. She has moderately severe pulmonary hypertension. She has not needed to use nitroglycerin. Pulmonary hypertension likely secondary to obesity hypoventilation and pulmonary embolism with documented RVE on recent 2 D doppler echocardiogram/refuses to  use oxygen. She is compliant with C-PAP. Target BP 130/80   10/06/20 she is feeling well.  She has DOE so somewhat limited in activity.  She again is not ready to have home oxygen and she tells me last sats with PCP were good though she did not walk.  She has seen Dr. Halford Chessman for sleep apnea and wears every night.  No bleeding with xarelto.    Recent LDL is 54 and HDL 41   Today she presents with increased dyspnea.  She does house work but takes most of the day.  She can only do a little at a time and has to rest to cacth her breath. No chest pain at times she feels lightheaded.  When she saw Dr. Renold Genta her lasix increased from 20 prn to lasix 40 mg daily and she has improved some. At times her pulse ox drops to low 90s or high 80s.  She uses her CPAP at night. No bleeding with xarelto.  No chest pain.  Her Mg+ was low and she is on Mg+ 400 BID but causes freq stools.      Past Medical History:  Diagnosis Date  . Anemia    none recently  . Anxiety   . Arthritis   . Asthma   . Complete tear of right rotator cuff 12/05/2014  .  Depression   . Diabetes mellitus    not on meds.denies a1c 5.8  . Diverticulitis   . Diverticulosis   . Family history of adverse reaction to anesthesia    mom  nausea and vomiting  . GERD (gastroesophageal reflux disease)   . Hypertension   . Hypothyroidism   . Migraine   . Personal history of kidney stones   . Pneumonia    hx  . Pulmonary embolism ([place])    a. diagnosed in 01/2017 w/ imaging showing multiple right lung PE with right heart strain. Started on Xarelto.   . Shortness of breath dyspnea    exersion  . Thyroid disease    hypo  . Wears glasses     Past Surgical History:  Procedure Laterality Date  . ABDOMINAL HYSTERECTOMY    . CATARACT EXTRACTION, BILATERAL    . CESAREAN SECTION    . COLONOSCOPY     multiple   . ESOPHAGOGASTRODUODENOSCOPY ENDOSCOPY     multiple  . INTRAVASCULAR PRESSURE WIRE/FFR STUDY N/A 01/14/2019   Procedure: INTRAVASCULAR PRESSURE WIRE/FFR STUDY;  Surgeon: Belva Crome, MD;  Location: [facility];  Service: Cardiovascular;  Laterality: N/A;  . KNEE CARTILAGE  SURGERY     Left  . LAPAROSCOPIC PARTIAL COLECTOMY N/A 04/22/2013   Procedure: LAPAROSCOPIC PARTIAL COLECTOMY;  Surgeon: Odis Hollingshead, MD;  Location: [facility];  Service: General;  Laterality: N/A;  . LEFT HEART CATH AND CORONARY ANGIOGRAPHY N/A 01/14/2019   Procedure: LEFT HEART CATH AND CORONARY ANGIOGRAPHY;  Surgeon: Belva Crome, MD;  Location: [facility];  Service: Cardiovascular;  Laterality: N/A;  . right foot surgery  little toe and next toe   x 3  . SHOULDER ARTHROSCOPY WITH ROTATOR CUFF REPAIR AND SUBACROMIAL DECOMPRESSION Right 12/05/2014   Procedure: RIGHT SHOULDER ARTHROSCOPY,ACROMOPLASTY, ROTATOR CUFF REPAIR;  Surgeon: [name], MD;  Location: [facility];  Service: Orthopedics;  Laterality: Right;     Current Outpatient Medications  Medication Sig Dispense Refill  . ALPRAZolam (XANAX) 0.5 MG tablet TAKE 1 TABLET BY MOUTH TWICE A DAY  AS NEEDED 180 tablet 0  . atorvastatin (LIPITOR) 80 MG tablet TAKE 1 TABLET BY MOUTH EVERY DAY AT 6:00 PM 90 tablet 3  . cetirizine (ZYRTEC) 10 MG tablet Take 10 mg by mouth at bedtime.     . Cholecalciferol (VITAMIN D3) 5000 units CAPS Take 5,000 Units by mouth daily.    . clobetasol ointment (TEMOVATE) [id number] % Apply 1 application topically 2 (two) times daily as needed (skin irritation).    . clotrimazole (MYCELEX) 10 MG troche Take 10 mg by mouth 3 (three) times daily as needed (candidiasis).   5  . EPINEPHrine 0.3 mg/0.3 mL IJ SOAJ injection Inject 0.3 mLs (0.3 mg total) into the muscle as needed for anaphylaxis. 1 each 11  . escitalopram (LEXAPRO) 10 MG tablet TAKE 1 TABLET (10 MG TOTAL) DAILY BY MOUTH. 90 tablet 3  . esomeprazole (NEXIUM) 40 MG capsule Take 40 mg by mouth daily at 12 noon.    . famotidine (PEPCID) 10 MG tablet Take 10 mg by mouth at bedtime.    . furosemide (LASIX) 40 MG tablet Take 40 mg by mouth.    . levothyroxine (SYNTHROID) 200 MCG tablet TAKE 1 TABLET BY MOUTH EVERY DAY ON EMPTY STOMACH BEFORE BREAKFAST 90 tablet 1  . magnesium oxide (MAG-OX) 400 MG tablet TAKE 1 TABLET BY MOUTH TWICE A DAY 180 tablet 3  . metoprolol tartrate (LOPRESSOR) 25 MG tablet TAKE 1 TABLET BY MOUTH EVERY DAY IN THE MORNING 90 tablet 1  . montelukast (SINGULAIR) 10 MG tablet Take 1 tablet (10 mg total) by mouth at bedtime. 90 tablet 3  . nitroGLYCERIN (NITROSTAT) 0.4 MG SL tablet Place 1 tablet (0.4 mg total) under the tongue every 5 (five) minutes as needed for chest pain. 100 tablet 3  . olmesartan (BENICAR) 40 MG tablet Take 1 tablet (40 mg total) by mouth daily. 90 tablet 3  . traZODone (DESYREL) 150 MG tablet TAKE 1 TABLET (150 MG TOTAL) BY MOUTH AT BEDTIME. 90 tablet 1  . VENTOLIN HFA 108 (90 Base) MCG/ACT inhaler USE 2 PUFFS EVERY 6 HOURS AS NEEDED FOR WHEEZING 18 Inhaler 10  . XARELTO 20 MG TABS tablet TAKE 1 TABLET (20 MG TOTAL) BY MOUTH DAILY WITH SUPPER. 90 tablet 2  . zolpidem  (AMBIEN) 10 MG tablet TAKE 1 TABLET (10 MG TOTAL) BY MOUTH AT BEDTIME AS NEEDED. 90 tablet 1   No current facility-administered medications for this visit.    Allergies:   Aspirin, Lisinopril, Bee venom, and Morphine and related    Social History:  The patient  reports that she quit  smoking about 37 years ago. Her smoking use included cigarettes. She has a 3.00 pack-year smoking history. She has never used smokeless tobacco. She reports that she does not drink alcohol or use drugs.   Family History:  The patient's family history includes Breast cancer in her paternal aunt; Diabetes in her father; Heart disease in her mother; Kidney failure in her father.    ROS:  General:no colds or fevers, + weight increase Skin:no rashes or ulcers HEENT:no blurred vision, no congestion CV:see HPI PUL:see HPI GI:no diarrhea constipation or melena, no indigestion GU:no hematuria, no dysuria MS:no joint pain, no claudication Neuro:no syncope, occ lightheadedness with her SOB Endo:no diabetes, no thyroid disease  Wt Readings from Last 3 Encounters:  03/18/20 (!) 309 lb 8 oz (140.4 kg)  03/16/20 (!) 306 lb (138.8 kg)  10/07/19 300 lb (136.1 kg)     PHYSICAL EXAM: VS:  BP 122/80   Pulse 70   Ht 5\' 5"  (1.651 m)   Wt (!) 309 lb 8 oz (140.4 kg)   SpO2 93%   BMI 51.50 kg/m  , BMI Body mass index is 51.5 kg/m. General:Pleasant affect, NAD Skin:Warm and dry, brisk capillary refill HEENT:normocephalic, sclera clear, mucus membranes moist Neck:supple, no JVD, no bruits  Heart:S1S2 RRR without murmur, gallup, rub or click Lungs:clear without rales, rhonchi, or wheezes [id number], non tender, + BS, do not palpate liver spleen or masses Ext:+ lower ext edema, 2+ pedal pulses, 2+ radial pulses Neuro:alert and oriented X 3, MAE, follows commands, + facial symmetry    EKG:  EKG is ordered today. The ekg ordered today demonstrates SR with 1st degree AV block stable EKG. No changes.    Recent  Labs: 03/19/2019: Magnesium 1.6 09/16/2019: TSH 0.55 03/16/2020: ALT 14; Brain Natriuretic Peptide 26; BUN 22; Creat 1.24; Hemoglobin 12.9; Platelets 339; Potassium 4.9; Sodium 143    Lipid Panel    Component Value Date/Time   CHOL 114 09/16/2019 1154   CHOL 102 05/08/2019 1013   TRIG 103 09/16/2019 1154   HDL 41 (L) 09/16/2019 1154   HDL 43 05/08/2019 1013   CHOLHDL 2.8 09/16/2019 1154   VLDL 39 (H) 05/25/2016 1213   LDLCALC 54 09/16/2019 1154       Other studies Reviewed: Additional studies/ records that were reviewed today include: . Cardiac cath 01/14/19   60 to 70% mid LAD with both DFR and FFR (0.91 and 0.84 respectively) documenting absence of hemodynamic significance.  Normal left main  Normal circumflex  Normal RCA  Normal LV function and LVEDP.  EF 65%  RECOMMENDATIONS:   Possible allergy to aspirin  Aggressive risk factor modification.  Started high intensity statin therapy.  Other aggressive secondary risk modification measures will also be stressed.  Resume Xarelto greater than 12 hours after sheath pull from radial artery  Needs clinical follow-up and if has convincing symptoms of angina in the future, the LAD would be reasonably easy to treat with percutaneous therapy.   ECHO 01/14/19  IMPRESSIONS    1. The left ventricle has normal systolic function with an ejection  fraction of 60-65%. The cavity size was normal. Left ventricular diastolic  parameters were normal.  2. The right ventricle has mildly reduced systolic function. The cavity  was moderately enlarged. There is no increase in right ventricular wall  thickness. Right ventricular systolic pressure is severely elevated with  an estimated pressure of 71.7 mmHg.  3. The mitral valve is normal in structure. There is mild mitral annular  calcification present.  4. The tricuspid valve is normal in structure.  5. The aortic valve is tricuspid.  6. The pulmonic valve was normal in structure.   7. The inferior vena cava was dilated in size with >50% respiratory  variability.  8. The interatrial septum appears to be lipomatous.   FINDINGS  Left Ventricle: The left ventricle has normal systolic function, with an  ejection fraction of 60-65%. The cavity size was normal. There is no  increase in left ventricular wall thickness. Left ventricular diastolic  parameters were normal Normal left  ventricular filling pressures Definity contrast agent was given IV to  delineate the left ventricular endocardial borders.  Right Ventricle: The right ventricle has mildly reduced systolic function.  The cavity was moderately enlarged. There is no increase in right  ventricular wall thickness. Right ventricular systolic pressure is  severely elevated with an estimated pressure  of 71.7 mmHg.  Left Atrium: left atrial size was normal in size  Right Atrium: right atrial size was normal in size. Right atrial pressure  is estimated at 8 mmHg.  Interatrial Septum: No atrial level shunt detected by color flow Doppler.  Increased thickness of the atrial septum sparing the fossa ovalis  consistent with The interatrial septum appears to be lipomatous.  Pericardium: There is no evidence of pericardial effusion.  Mitral Valve: The mitral valve is normal in structure. There is mild  mitral annular calcification present. Mitral valve regurgitation is mild  by color flow Doppler.  Tricuspid Valve: The tricuspid valve is normal in structure. Tricuspid  valve regurgitation is mild by color flow Doppler.  Aortic Valve: The aortic valve is tricuspid Aortic valve regurgitation was  not visualized by color flow Doppler.  Pulmonic Valve: The pulmonic valve was normal in structure. Pulmonic valve  regurgitation is not visualized by color flow Doppler.  Venous: The inferior vena cava measures 2.47 cm, is dilated in size with  greater than 50% respiratory variability.     ASSESSMENT AND PLAN:  1.  DOE  BNP is normal + diastolic Heart failure, with walking her sp02 is the low at 91 to 92%  will increase lasix to 60 mg for 3 days then back to 40 mg daily.  Also have asked her to see Dr. Halford Chessman for follow-up -she may need RHC. Follow up with [place] in 2 months in person. Check BMP for kidney function reviewed labs from PCP and OV notes  2.  CAD with last cath but non obstructive and no angina.    3.  Hypo magnesium, will recheck -with freq stools would like to decrease Mg+  4.  HLD on statin, continue   5.  CKD 3 a  Monitor   6.  Sleep apnea, wears her CPAP  Per pulmonary  7.  Secondary pulmonary HTN, her DOE has increased.    8.  DM-2 per PCP  9.  Hx of PE on xarelto  No bleeding      Current medicines are reviewed with the patient today.  The patient Has no concerns regarding medicines.  The following changes have been made:  See above Labs/ tests ordered today include:see above  Disposition:   FU:  see above  Signed, Tammy Kicks, NP  03/18/2020 11:01 AM    [address], [place], [place], [place] Phone: [phone]; Fax: [phone]

## 2020-03-18 ENCOUNTER — Ambulatory Visit (INDEPENDENT_AMBULATORY_CARE_PROVIDER_SITE_OTHER): Payer: Medicare Other | Admitting: Cardiology

## 2020-03-18 ENCOUNTER — Encounter: Payer: Self-pay | Admitting: Cardiology

## 2020-03-18 ENCOUNTER — Other Ambulatory Visit: Payer: Self-pay

## 2020-03-18 VITALS — BP 122/80 | HR 70 | Ht 65.0 in | Wt 309.5 lb

## 2020-03-18 DIAGNOSIS — I25118 Atherosclerotic heart disease of native coronary artery with other forms of angina pectoris: Secondary | ICD-10-CM | POA: Diagnosis not present

## 2020-03-18 DIAGNOSIS — E1100 Type 2 diabetes mellitus with hyperosmolarity without nonketotic hyperglycemic-hyperosmolar coma (NKHHC): Secondary | ICD-10-CM

## 2020-03-18 DIAGNOSIS — R0600 Dyspnea, unspecified: Secondary | ICD-10-CM

## 2020-03-18 DIAGNOSIS — I2729 Other secondary pulmonary hypertension: Secondary | ICD-10-CM | POA: Diagnosis not present

## 2020-03-18 DIAGNOSIS — G4733 Obstructive sleep apnea (adult) (pediatric): Secondary | ICD-10-CM | POA: Diagnosis not present

## 2020-03-18 DIAGNOSIS — I10 Essential (primary) hypertension: Secondary | ICD-10-CM | POA: Diagnosis not present

## 2020-03-18 DIAGNOSIS — Z9989 Dependence on other enabling machines and devices: Secondary | ICD-10-CM | POA: Diagnosis not present

## 2020-03-18 DIAGNOSIS — R0609 Other forms of dyspnea: Secondary | ICD-10-CM

## 2020-03-18 MED ORDER — FUROSEMIDE 40 MG PO TABS: ORAL_TABLET | ORAL | 3 refills | Status: DC

## 2020-03-18 NOTE — Patient Instructions (Addendum)
Medication Instructions:  Your physician has recommended you make the following change in your medication:  1.  INCREASE the Lasix to 1 1/2 tablet for 3 days then go back to 1 tablet daily   *If you need a refill on your cardiac medications before your next appointment, please call your pharmacy*   Lab Work: TODAY:  BMET & MAG  If you have labs (blood work) drawn today and your tests are completely normal, you will receive your results only by: Marland Kitchen MyChart Message (if you have MyChart) OR . A paper copy in the mail If you have any lab test that is abnormal or we need to change your treatment, we will call you to review the results.   Testing/Procedures: None ordered  Call Dr. Halford Chessman and make an appointment to see him.  Follow-Up: At [facility], you and your health needs are our priority.  As part of our continuing mission to provide you with exceptional heart care, we have created designated Provider Care Teams.  These Care Teams include your primary Cardiologist (physician) and Advanced Practice Providers (APPs -  Physician Assistants and Nurse Practitioners) who all work together to provide you with the care you need, when you need it.  We recommend signing up for the patient portal called "MyChart".  Sign up information is provided on this After Visit Summary.  MyChart is used to connect with patients for Virtual Visits (Telemedicine).  Patients are able to view lab/test results, encounter notes, upcoming appointments, etc.  Non-urgent messages can be sent to your provider as well.   To learn more about what you can do with MyChart, go to [email or web address].    Your next appointment:   2 month(s)  05/27/2020 10:40 ARRIVE AT 10:25 FOR REGISTRATION  The format for your next appointment:   In Person  Provider:   You may see Sinclair Grooms, MD or one of the following Advanced Practice Providers on your designated Care Team:    Truitt Merle, NP  Cecilie Kicks, NP  Kathyrn Drown, NP    Other Instructions

## 2020-03-19 ENCOUNTER — Telehealth: Payer: Self-pay | Admitting: *Deleted

## 2020-03-19 LAB — BASIC METABOLIC PANEL
BUN/Creatinine Ratio: 22 (ref 12–28)
BUN: 25 mg/dL (ref 8–27)
CO2: 25 mmol/L (ref 20–29)
Calcium: 9.2 mg/dL (ref 8.7–10.3)
Chloride: 101 mmol/L (ref 96–106)
Creatinine, Ser: 1.16 mg/dL — ABNORMAL HIGH (ref 0.57–1.00)
GFR calc Af Amer: 56 mL/min/{1.73_m2} — ABNORMAL LOW (ref >59)
GFR calc non Af Amer: 49 mL/min/{1.73_m2} — ABNORMAL LOW (ref >59)
Glucose: 127 mg/dL — ABNORMAL HIGH (ref 65–99)
Potassium: 4.7 mmol/L (ref 3.5–5.2)
Sodium: 142 mmol/L (ref 134–144)

## 2020-03-19 LAB — MAGNESIUM: Magnesium: 1.6 mg/dL (ref 1.6–2.3)

## 2020-03-19 MED ORDER — MAGNESIUM OXIDE 400 MG PO TABS: ORAL_TABLET | ORAL | 11 refills | Status: AC

## 2020-03-19 NOTE — Telephone Encounter (Signed)
-----   Message from Isaiah Serge, NP sent at 03/19/2020  8:26 AM EDT ----- Kidney function improved.  More to baseline.  And K+ is upper limits of normal.  So no K+ supplement except bananas or an orange a day.  Mg+ is lower limit of normal.  Decrease Mg+ to once a day and on MWF take twice per day.  This may help prevent freq stools as well.

## 2020-03-27 ENCOUNTER — Other Ambulatory Visit: Payer: Self-pay | Admitting: Internal Medicine

## 2020-04-24 ENCOUNTER — Other Ambulatory Visit: Payer: Self-pay | Admitting: Internal Medicine

## 2020-05-13 ENCOUNTER — Ambulatory Visit (INDEPENDENT_AMBULATORY_CARE_PROVIDER_SITE_OTHER): Payer: Medicare Other | Admitting: Primary Care

## 2020-05-13 ENCOUNTER — Encounter: Payer: Self-pay | Admitting: Primary Care

## 2020-05-13 ENCOUNTER — Other Ambulatory Visit: Payer: Self-pay

## 2020-05-13 DIAGNOSIS — R0609 Other forms of dyspnea: Secondary | ICD-10-CM

## 2020-05-13 DIAGNOSIS — R0600 Dyspnea, unspecified: Secondary | ICD-10-CM

## 2020-05-13 DIAGNOSIS — G4733 Obstructive sleep apnea (adult) (pediatric): Secondary | ICD-10-CM

## 2020-05-13 DIAGNOSIS — I2729 Other secondary pulmonary hypertension: Secondary | ICD-10-CM

## 2020-05-13 NOTE — Patient Instructions (Signed)
Resume CPAP use, encourage you to wear every night for minimum 4-6 hours Continue to work on weight loss efforts Continue lasix 40mg  daily Follow up with Dr. Halford Chessman in office in 4-8 weeks

## 2020-05-13 NOTE — Progress Notes (Signed)
Virtual Visit via Telephone Note  I connected with [name] on 05/13/20 at 10:00 AM EDT by telephone and verified that I am speaking with the correct person using two identifiers.  Location: Patient: Home Provider: Office   I discussed the limitations, risks, security and privacy concerns of performing an evaluation and management service by telephone and the availability of in person appointments. I also discussed with the patient that there may be a patient responsible charge related to this service. The patient expressed understanding and agreed to proceed.   History of Present Illness:  69 year old female, former smoker quit 1984 (3 pack year hx) PMH significant for asthma, acute saddle pulmonary embolism and DVT in March 2018, pulmonary hypertension Patient of Dr. Halford Chessman, last seen on 10/04/19.   HST 11/19/17 showed an AHI 14.6, SpO2 low 73%  Previous [facility] pulmonary encounters: 11/27/2018 Patient presents today to review home sleep test. HST showed mild-moderate obstructive sleep apnea and low oxygen levels at night. Reports sleep disruption, snoring and daytime sleepiness. Takes naps during the day occasionally. Long discussion about OSA treatment options including weight loss, oral appliance, CPAP and surgery. She is somewhat claustrophobic and does not like things touching her face. Showed patient samples of different types of mask, feels she may be able to try [employer] dreamwear full facemask.  03/19/20- Pulmonary NP, Tonya Patient has a tele-visit today for follow-up on new start CPAP.  She states that she has been doing well with her CPAP and is compliant.  Denies any current issues with mask.  She states that she is still trying to get adjusted to wearing the machine each night.  She does feel improvement throughout the day.  She complains today of some shortness of breath and wheezing for the past 3 days.  She has a visit scheduled with her PCP tomorrow for this issue.  Her  PCP manages her asthma. Denies f/c/s, n/v/d, hemoptysis, PND, leg swelling.   10/03/20- Dr. Halford Chessman  She is doing well with CPAP.  Has nasal pillows mask.  Pressure okay.  Not having sinus congestion, sore throat, dry mouth, or aerophagia.  05/13/2020- interim hx Patient contacted today for virtual televisit for OSA. States that she hasn't been as diligent using her CPAP. She does have some difficulty falling asleep at times wearing her mask . She uses nasal pillows. DME company is [employer]   Reports decreased energy. She does get short of breath with activity. States that it is more she does not have the stamina to do anything. She had left heart cath in March but did not have right heart cath. She is taking 40mg  lasix daily. She has some ankle swelling. She does not feel like her breathing symptoms are related to asthma. She has had poor compliance with her cpap and encouraged her to resume this d/t hx of pulmonary HTN. Denies chest tightness or overt wheezing.   Sleep Study: HST 11/19/18 >>AHI 14.6, SpO2 low 73%. Spent 270.4 min with SpO2 <89%. Auto CPAP 09/04/19 to 10/03/19 >> used on 28 of 30 nights with average 5 hrs 43 min.  Average AHI 2 with median CPAP 7 and 95 th percentile CPAP 11 cm H2O  Cardiac tests: Echo 07/31/17 >> EF 60 to 65%, grade 1 DD, mild MR, PAS 40 mmHg Echo 01/14/19 >> EF 60-65%; Right ventricular systolic pressure is severely elevated with  an estimated pressure of 71.7 mmHg.   01/14/19 >> 60-70% mid LAD with both DFR and FFR  Pulmonary test: CT angio chest 01/19/17 >> multiple PE with RV:LV 1 NO PFTS on file   Observations/Objective:  - Able to speak in full sentences; no overt shortness of breath or wheezing  Assessment and Plan:  OSA: - Recent poor compliance with CPAP - She denies issues with mask fit or pressure setting - Encouraged patient to resume wearing CPAP (aim to wear every night for 4-6 hours or more) - Continue auto CPAP pressure 5-15cm h20 - No  changes today  Dyspnea on exertion: - Likely multifactorial d.t obesity, CAD, pulmonary HTN - Continue CPAP, diuretics and encourage weight loss - Symptoms do not sound consistent with asthma but may want to consider checking PFTs to rule out obstructive lung disease   Pulmonary HTN: - Secondary to OSA/OHS and PE - Echocardiogram in March 2021 showed right ventricular systolic pressure is severely elevated with  an estimated pressure of 71.7 mmHg - Target BP is 130/80 per cardiology   Follow Up Instructions:   - FU with Dr. Halford Chessman in 4-8 weeks   I discussed the assessment and treatment plan with the patient. The patient was provided an opportunity to ask questions and all were answered. The patient agreed with the plan and demonstrated an understanding of the instructions.   The patient was advised to call back or seek an in-person evaluation if the symptoms worsen or if the condition fails to improve as anticipated.  I provided 22 minutes of non-face-to-face time during this encounter.   Martyn Ehrich, NP

## 2020-05-13 NOTE — Progress Notes (Signed)
Reviewed and agree with assessment/plan.   Chesley Mires, MD [facility] Pulmonary/Critical Care 05/13/2020, 10:07 AM Pager:  [phone]

## 2020-05-25 NOTE — Progress Notes (Signed)
Cardiology Office Note:    Date:  05/27/2020   ID:  [name], DOB 02/25/1951, MRN [id number]  PCP:  Elby Showers, MD  Cardiologist:  Sinclair Grooms, MD   Referring MD: Elby Showers, MD   Chief Complaint  Patient presents with  . Shortness of Breath  . Coronary Artery Disease  . Advice Only    Prior PE 2018    History of Present Illness:    [name] is a 69 y.o. female with a hx of obesity, hypertension, DM II, CAD (60-70% mid LAD), and thyroid disease.  She has not had chest discomfort of any sort.  She does have subcritical LAD disease with FFR 0.84 at the time of heart catheterization in 2020.  Her only complaint is that exertional tolerance has markedly decreased over the past year.  She denies orthopnea and PND.  There is no chest discomfort with physical activity.  She simply states that she does not have the energy and endurance to do much walking.  She is limited by bilateral knee discomfort.  She walks with a cane.  She has gained weight during the pandemic.  She feels well when she is sitting.  Past Medical History:  Diagnosis Date  . Anemia    none recently  . Anxiety   . Arthritis   . Asthma   . Complete tear of right rotator cuff 12/05/2014  . Depression   . Diabetes mellitus    not on meds.denies a1c 5.8  . Diverticulitis   . Diverticulosis   . Family history of adverse reaction to anesthesia    mom  nausea and vomiting  . GERD (gastroesophageal reflux disease)   . Hypertension   . Hypothyroidism   . Migraine   . Personal history of kidney stones   . Pneumonia    hx  . Pulmonary embolism ([place])    a. diagnosed in 01/2017 w/ imaging showing multiple right lung PE with right heart strain. Started on Xarelto.   . Shortness of breath dyspnea    exersion  . Thyroid disease    hypo  . Wears glasses     Past Surgical History:  Procedure Laterality Date  . ABDOMINAL HYSTERECTOMY    . CATARACT EXTRACTION, BILATERAL    .  CESAREAN SECTION    . COLONOSCOPY     multiple   . ESOPHAGOGASTRODUODENOSCOPY ENDOSCOPY     multiple  . INTRAVASCULAR PRESSURE WIRE/FFR STUDY N/A 01/14/2019   Procedure: INTRAVASCULAR PRESSURE WIRE/FFR STUDY;  Surgeon: Belva Crome, MD;  Location: [facility];  Service: Cardiovascular;  Laterality: N/A;  . KNEE CARTILAGE SURGERY     Left  . LAPAROSCOPIC PARTIAL COLECTOMY N/A 04/22/2013   Procedure: LAPAROSCOPIC PARTIAL COLECTOMY;  Surgeon: Odis Hollingshead, MD;  Location: [facility];  Service: General;  Laterality: N/A;  . LEFT HEART CATH AND CORONARY ANGIOGRAPHY N/A 01/14/2019   Procedure: LEFT HEART CATH AND CORONARY ANGIOGRAPHY;  Surgeon: Belva Crome, MD;  Location: [facility];  Service: Cardiovascular;  Laterality: N/A;  . right foot surgery  little toe and next toe   x 3  . SHOULDER ARTHROSCOPY WITH ROTATOR CUFF REPAIR AND SUBACROMIAL DECOMPRESSION Right 12/05/2014   Procedure: RIGHT SHOULDER ARTHROSCOPY,ACROMOPLASTY, ROTATOR CUFF REPAIR;  Surgeon: [name], MD;  Location: [facility];  Service: Orthopedics;  Laterality: Right;    Current Medications: Current Meds  Medication Sig  . ALPRAZolam (XANAX) 0.5 MG tablet TAKE  1 TABLET BY MOUTH TWICE A DAY AS NEEDED  . atorvastatin (LIPITOR) 80 MG tablet TAKE 1 TABLET BY MOUTH EVERY DAY AT 6:00 PM  . cetirizine (ZYRTEC) 10 MG tablet Take 10 mg by mouth at bedtime.   . Cholecalciferol (VITAMIN D3) 5000 units CAPS Take 5,000 Units by mouth daily.  . clobetasol ointment (TEMOVATE) 3.84 % Apply 1 application topically 2 (two) times daily as needed (skin irritation).  . clotrimazole (MYCELEX) 10 MG troche Take 10 mg by mouth 3 (three) times daily as needed (candidiasis).   Marland Kitchen EPINEPHrine 0.3 mg/0.3 mL IJ SOAJ injection Inject 0.3 mLs (0.3 mg total) into the muscle as needed for anaphylaxis.  Marland Kitchen escitalopram (LEXAPRO) 10 MG tablet TAKE 1 TABLET (10 MG TOTAL) DAILY BY MOUTH.  Marland Kitchen esomeprazole (NEXIUM) 40 MG capsule  Take 40 mg by mouth daily at 12 noon.  . famotidine (PEPCID) 10 MG tablet Take 10 mg by mouth at bedtime.  . furosemide (LASIX) 40 MG tablet Take 40 mg by mouth daily. Takes an additional 40 mg if needed  . levothyroxine (SYNTHROID) 200 MCG tablet TAKE 1 TABLET BY MOUTH EVERY DAY ON EMPTY STOMACH BEFORE BREAKFAST  . magnesium oxide (MAG-OX) 400 MG tablet Take 1 tablet by mouth on Tuesdays, Thursdays, Saturdays, & Sundays.  Take 1 tablet by mouth twice a day on Mondays, Wednesdays, & Fridays  . metoprolol tartrate (LOPRESSOR) 25 MG tablet TAKE 1 TABLET BY MOUTH EVERY DAY IN THE MORNING  . montelukast (SINGULAIR) 10 MG tablet TAKE 1 TABLET BY MOUTH EVERYDAY AT BEDTIME  . olmesartan (BENICAR) 40 MG tablet Take 1 tablet (40 mg total) by mouth daily.  . traZODone (DESYREL) 150 MG tablet TAKE 1 TABLET (150 MG TOTAL) BY MOUTH AT BEDTIME.  . VENTOLIN HFA 108 (90 Base) MCG/ACT inhaler USE 2 PUFFS EVERY 6 HOURS AS NEEDED FOR WHEEZING  . XARELTO 20 MG TABS tablet TAKE 1 TABLET (20 MG TOTAL) BY MOUTH DAILY WITH SUPPER.  Marland Kitchen zolpidem (AMBIEN) 10 MG tablet TAKE 1 TABLET (10 MG TOTAL) BY MOUTH AT BEDTIME AS NEEDED.     Allergies:   Aspirin, Lisinopril, Bee venom, and Morphine and related   Social History   Socioeconomic History  . Marital status: Single    Spouse name: Not on file  . Number of children: Not on file  . Years of education: Not on file  . Highest education level: Not on file  Occupational History  . Not on file  Tobacco Use  . Smoking status: Former Smoker    Packs/day: 0.50    Years: 6.00    Pack years: 3.00    Types: Cigarettes    Quit date: 11/14/1982    Years since quitting: 37.5  . Smokeless tobacco: Never Used  . Tobacco comment: quit smoking 30 years ago  Substance and Sexual Activity  . Alcohol use: No  . Drug use: No  . Sexual activity: Not on file  Other Topics Concern  . Not on file  Social History Narrative  . Not on file   Social Determinants of Health    Financial Resource Strain:   . Difficulty of Paying Living Expenses:   Food Insecurity:   . Worried About Charity fundraiser in the Last Year:   . Arboriculturist in the Last Year:   Transportation Needs:   . Film/video editor (Medical):   Marland Kitchen Lack of Transportation (Non-Medical):   Physical Activity:   . Days of Exercise per  Week:   . Minutes of Exercise per Session:   Stress:   . Feeling of Stress :   Social Connections:   . Frequency of Communication with Friends and Family:   . Frequency of Social Gatherings with Friends and Family:   . Attends Religious Services:   . Active Member of Clubs or Organizations:   . Attends Archivist Meetings:   Marland Kitchen Marital Status:      Family History: The patient's family history includes Breast cancer in her paternal aunt; Diabetes in her father; Heart disease in her mother; Kidney failure in her father. There is no history of Colon cancer.  ROS:   Please see the history of present illness.    She wanted to get her knees fixed prior to the pandemic but put this off.  This greatly limits her ability to ambulate.  She had PE in 2018.  Exertional intolerance has developed progressively over the past 12 to 18 months during the pandemic.  No significant lower extremity swelling, orthopnea, or PND.  She is able to sleep lying on her sides.  She does have sleep apnea.  All other systems reviewed and are negative.  EKGs/Labs/Other Studies Reviewed:    The following studies were reviewed today: No new cardiac imaging  EKG:  EKG performed on 03/18/2020 demonstrates normal sinus rhythm with nonspecific ST abnormality.  Recent Labs: 09/16/2019: TSH 0.55 03/16/2020: ALT 14; Brain Natriuretic Peptide 26; Hemoglobin 12.9; Platelets 339 03/18/2020: BUN 25; Creatinine, Ser 1.16; Magnesium 1.6; Potassium 4.7; Sodium 142  Recent Lipid Panel    Component Value Date/Time   CHOL 114 09/16/2019 1154   CHOL 102 05/08/2019 1013   TRIG 103 09/16/2019  1154   HDL 41 (L) 09/16/2019 1154   HDL 43 05/08/2019 1013   CHOLHDL 2.8 09/16/2019 1154   VLDL 39 (H) 05/25/2016 1213   LDLCALC 54 09/16/2019 1154    Physical Exam:    VS:  BP 118/68   Pulse 70   Ht 5\' 5"  (1.651 m)   Wt (!) 310 lb 3.2 oz (140.7 kg)   SpO2 92%   BMI 51.62 kg/m     Wt Readings from Last 3 Encounters:  05/27/20 (!) 310 lb 3.2 oz (140.7 kg)  03/18/20 (!) 309 lb 8 oz (140.4 kg)  03/16/20 (!) 306 lb (138.8 kg)     GEN: Morbid obesity. No acute distress HEENT: Normal NECK: No JVD. LYMPHATICS: No lymphadenopathy CARDIAC:  RRR without murmur, gallop, or edema. VASCULAR:  Normal Pulses. No bruits. RESPIRATORY:  Clear to auscultation without rales, wheezing or rhonchi  ABDOMEN: Soft, non-tender, non-distended, No pulsatile mass, MUSCULOSKELETAL: No deformity  SKIN: Warm and dry NEUROLOGIC:  Alert and oriented x 3 PSYCHIATRIC:  Normal affect   ASSESSMENT:    1. Coronary artery disease of native artery of native heart with stable angina pectoris ([place])   2. Essential hypertension   3. DOE (dyspnea on exertion)   4. OSA on CPAP   5. Other secondary pulmonary hypertension ([place])   6. Type 2 diabetes mellitus with hyperosmolarity without coma, without long-term current use of insulin ([place])   7. Educated about COVID-19 virus infection    PLAN:    In order of problems listed above:  1. Secondary prevention discussed.  Any attempt at increasing physical activity will be helpful.  Continue efforts at secondary prevention.  Report angina. 2. Excellent blood pressure control.  Continue furosemide, Lopressor, and Benicar. 3. BNP wil not l be obtained to  determine if there is any clinical signal towards volume overload.  A recent BNP was 26 in May, which may not be very helpful given obesity and tendency for BNP to be an accurate. 4. See effect management issues other than encouraged to wear CPAP. 5. Secondary to PE and obstructive sleep apnea with obesity. 6. Consider  SGLT2 therapy 7. She has been vaccinated and practices social distancing.  Recommended cardiopulmonary rehab to provide additional clinical data with follow-up and accountability for activity.  She refused.   Medication Adjustments/Labs and Tests Ordered: Current medicines are reviewed at length with the patient today.  Concerns regarding medicines are outlined above.  No orders of the defined types were placed in this encounter.  No orders of the defined types were placed in this encounter.   Patient Instructions  Medication Instructions:  Your physician recommends that you continue on your current medications as directed. Please refer to the Current Medication list given to you today.  *If you need a refill on your cardiac medications before your next appointment, please call your pharmacy*   Lab Work: None If you have labs (blood work) drawn today and your tests are completely normal, you will receive your results only by: Marland Kitchen MyChart Message (if you have MyChart) OR . A paper copy in the mail If you have any lab test that is abnormal or we need to change your treatment, we will call you to review the results.   Testing/Procedures: None   Follow-Up: At [facility], you and your health needs are our priority.  As part of our continuing mission to provide you with exceptional heart care, we have created designated Provider Care Teams.  These Care Teams include your primary Cardiologist (physician) and Advanced Practice Providers (APPs -  Physician Assistants and Nurse Practitioners) who all work together to provide you with the care you need, when you need it.  We recommend signing up for the patient portal called "MyChart".  Sign up information is provided on this After Visit Summary.  MyChart is used to connect with patients for Virtual Visits (Telemedicine).  Patients are able to view lab/test results, encounter notes, upcoming appointments, etc.  Non-urgent messages can be  sent to your provider as well.   To learn more about what you can do with MyChart, go to [email or web address].    Your next appointment:   6 month(s)  The format for your next appointment:   In Person  Provider:   You may see Sinclair Grooms, MD or one of the following Advanced Practice Providers on your designated Care Team:    Truitt Merle, NP  Cecilie Kicks, NP  Kathyrn Drown, NP    Other Instructions      Signed, Sinclair Grooms, MD  05/27/2020 1:08 PM    [place]

## 2020-05-27 ENCOUNTER — Ambulatory Visit (INDEPENDENT_AMBULATORY_CARE_PROVIDER_SITE_OTHER): Payer: Medicare Other | Admitting: Interventional Cardiology

## 2020-05-27 ENCOUNTER — Other Ambulatory Visit: Payer: Self-pay

## 2020-05-27 ENCOUNTER — Encounter: Payer: Self-pay | Admitting: Interventional Cardiology

## 2020-05-27 VITALS — BP 118/68 | HR 70 | Ht 65.0 in | Wt 310.2 lb

## 2020-05-27 DIAGNOSIS — I25118 Atherosclerotic heart disease of native coronary artery with other forms of angina pectoris: Secondary | ICD-10-CM | POA: Diagnosis not present

## 2020-05-27 DIAGNOSIS — Z9989 Dependence on other enabling machines and devices: Secondary | ICD-10-CM

## 2020-05-27 DIAGNOSIS — I10 Essential (primary) hypertension: Secondary | ICD-10-CM | POA: Diagnosis not present

## 2020-05-27 DIAGNOSIS — R0600 Dyspnea, unspecified: Secondary | ICD-10-CM

## 2020-05-27 DIAGNOSIS — Z7189 Other specified counseling: Secondary | ICD-10-CM

## 2020-05-27 DIAGNOSIS — I2729 Other secondary pulmonary hypertension: Secondary | ICD-10-CM

## 2020-05-27 DIAGNOSIS — E1100 Type 2 diabetes mellitus with hyperosmolarity without nonketotic hyperglycemic-hyperosmolar coma (NKHHC): Secondary | ICD-10-CM

## 2020-05-27 DIAGNOSIS — G4733 Obstructive sleep apnea (adult) (pediatric): Secondary | ICD-10-CM | POA: Diagnosis not present

## 2020-05-27 DIAGNOSIS — R0609 Other forms of dyspnea: Secondary | ICD-10-CM

## 2020-05-27 NOTE — Patient Instructions (Signed)

## 2020-06-23 ENCOUNTER — Encounter: Payer: Self-pay | Admitting: Internal Medicine

## 2020-06-23 DIAGNOSIS — H43813 Vitreous degeneration, bilateral: Secondary | ICD-10-CM | POA: Diagnosis not present

## 2020-06-23 DIAGNOSIS — R7309 Other abnormal glucose: Secondary | ICD-10-CM | POA: Diagnosis not present

## 2020-06-23 DIAGNOSIS — Z961 Presence of intraocular lens: Secondary | ICD-10-CM | POA: Diagnosis not present

## 2020-06-23 DIAGNOSIS — H04123 Dry eye syndrome of bilateral lacrimal glands: Secondary | ICD-10-CM | POA: Diagnosis not present

## 2020-06-23 DIAGNOSIS — H353121 Nonexudative age-related macular degeneration, left eye, early dry stage: Secondary | ICD-10-CM | POA: Diagnosis not present

## 2020-06-23 DIAGNOSIS — H35372 Puckering of macula, left eye: Secondary | ICD-10-CM | POA: Diagnosis not present

## 2020-06-23 DIAGNOSIS — H18513 Endothelial corneal dystrophy, bilateral: Secondary | ICD-10-CM | POA: Diagnosis not present

## 2020-06-23 LAB — HM DIABETES EYE EXAM

## 2020-07-02 ENCOUNTER — Other Ambulatory Visit: Payer: Self-pay | Admitting: Internal Medicine

## 2020-07-03 DIAGNOSIS — L438 Other lichen planus: Secondary | ICD-10-CM | POA: Diagnosis not present

## 2020-07-03 DIAGNOSIS — Z79899 Other long term (current) drug therapy: Secondary | ICD-10-CM | POA: Diagnosis not present

## 2020-07-03 DIAGNOSIS — L570 Actinic keratosis: Secondary | ICD-10-CM | POA: Diagnosis not present

## 2020-07-17 ENCOUNTER — Other Ambulatory Visit: Payer: Self-pay | Admitting: Internal Medicine

## 2020-07-17 ENCOUNTER — Other Ambulatory Visit: Payer: Self-pay | Admitting: Nurse Practitioner

## 2020-08-07 DIAGNOSIS — Z23 Encounter for immunization: Secondary | ICD-10-CM | POA: Diagnosis not present

## 2020-09-03 ENCOUNTER — Other Ambulatory Visit: Payer: Self-pay | Admitting: Internal Medicine

## 2020-09-16 DIAGNOSIS — Z23 Encounter for immunization: Secondary | ICD-10-CM | POA: Diagnosis not present

## 2020-09-18 ENCOUNTER — Ambulatory Visit (INDEPENDENT_AMBULATORY_CARE_PROVIDER_SITE_OTHER): Payer: Medicare Other | Admitting: Internal Medicine

## 2020-09-18 ENCOUNTER — Encounter: Payer: Self-pay | Admitting: Internal Medicine

## 2020-09-18 ENCOUNTER — Other Ambulatory Visit: Payer: Self-pay

## 2020-09-18 VITALS — BP 120/80 | HR 88 | Ht 65.0 in | Wt 302.0 lb

## 2020-09-18 DIAGNOSIS — I10 Essential (primary) hypertension: Secondary | ICD-10-CM | POA: Diagnosis not present

## 2020-09-18 DIAGNOSIS — Z86711 Personal history of pulmonary embolism: Secondary | ICD-10-CM

## 2020-09-18 DIAGNOSIS — F32A Depression, unspecified: Secondary | ICD-10-CM | POA: Diagnosis not present

## 2020-09-18 DIAGNOSIS — E1100 Type 2 diabetes mellitus with hyperosmolarity without nonketotic hyperglycemic-hyperosmolar coma (NKHHC): Secondary | ICD-10-CM | POA: Diagnosis not present

## 2020-09-18 DIAGNOSIS — G4733 Obstructive sleep apnea (adult) (pediatric): Secondary | ICD-10-CM

## 2020-09-18 DIAGNOSIS — E785 Hyperlipidemia, unspecified: Secondary | ICD-10-CM

## 2020-09-18 DIAGNOSIS — R1011 Right upper quadrant pain: Secondary | ICD-10-CM | POA: Diagnosis not present

## 2020-09-18 DIAGNOSIS — M25562 Pain in left knee: Secondary | ICD-10-CM

## 2020-09-18 DIAGNOSIS — G8929 Other chronic pain: Secondary | ICD-10-CM

## 2020-09-18 DIAGNOSIS — E1169 Type 2 diabetes mellitus with other specified complication: Secondary | ICD-10-CM

## 2020-09-18 DIAGNOSIS — Z0000 Encounter for general adult medical examination without abnormal findings: Secondary | ICD-10-CM

## 2020-09-18 DIAGNOSIS — R109 Unspecified abdominal pain: Secondary | ICD-10-CM | POA: Diagnosis not present

## 2020-09-18 DIAGNOSIS — G4730 Sleep apnea, unspecified: Secondary | ICD-10-CM

## 2020-09-18 DIAGNOSIS — M25561 Pain in right knee: Secondary | ICD-10-CM | POA: Diagnosis not present

## 2020-09-18 DIAGNOSIS — M25551 Pain in right hip: Secondary | ICD-10-CM | POA: Diagnosis not present

## 2020-09-18 DIAGNOSIS — M25552 Pain in left hip: Secondary | ICD-10-CM | POA: Diagnosis not present

## 2020-09-18 DIAGNOSIS — F419 Anxiety disorder, unspecified: Secondary | ICD-10-CM

## 2020-09-18 DIAGNOSIS — I25118 Atherosclerotic heart disease of native coronary artery with other forms of angina pectoris: Secondary | ICD-10-CM

## 2020-09-18 DIAGNOSIS — E119 Type 2 diabetes mellitus without complications: Secondary | ICD-10-CM | POA: Diagnosis not present

## 2020-09-18 LAB — POCT URINALYSIS DIPSTICK
Appearance: NEGATIVE
Bilirubin, UA: NEGATIVE
Blood, UA: NEGATIVE
Glucose, UA: NEGATIVE
Ketones, UA: NEGATIVE
Leukocytes, UA: NEGATIVE
Nitrite, UA: NEGATIVE
Odor: NEGATIVE
Protein, UA: NEGATIVE
Spec Grav, UA: 1.015 (ref 1.010–1.025)
Urobilinogen, UA: 0.2 E.U./dL
pH, UA: 6 (ref 5.0–8.0)

## 2020-09-18 NOTE — Patient Instructions (Addendum)
Patient has had 2 attacks of RUQ pain after eating some fatty foods. Has known gallstones. To have hepatobiliary scan and fasting labs have been drawn today including amylase. To have hepatobiliary scan. RTC 6 months for evaluation of medical issues.  Refer to surgeon if hepatobiliary scan is positive.  TSH is elevated and needs to be rechecked in a few weeks.  Take thyroid replacement on empty stomach with no medications or food

## 2020-09-18 NOTE — Progress Notes (Signed)
Subjective:    Patient ID: [name], female    DOB: 01/21/1951, 69 y.o.   MRN: [id number]  HPI  69 year old Female for Medicare wellness, health maintenance exam and evaluation of medical issues.  She is concerned because she has had several hour episode of abdominal pain triggered by eating at [place].  Pain has been both in the abdomen and in the back, right-sided.  Intermittent discomfort for 3 weeks.  Also another episode triggered by eating tuna salad.  Says she was told she had gallstones on ultrasound in 2020.  Amylase was checked with this visit and was normal at 41.  She is going to have hepatobiliary scan in the near future.  She has a history of morbid obesity.  History of osteoarthritis of both knees.  History of lichen planus involving the mouth which has intermittent exacerbations.  Remains on chronic Xarelto therapy after having an acute saddle pulmonary embolism with acute cor pulmonale March 2018.  History of angioedema.  History of diverticulitis in the proximal descending colon status post laparoscopic-assisted left colectomy June 2014 by Dr. Zella Richer.  History of hypothyroidism.  History of right shoulder arthroscopic surgery, acromioplasty and rotator cuff repair in January 2016.  History of GE reflux, depression, insomnia, ulcerative colitis, hypokalemia, migraine headaches, asthma, history of B12 deficiency.  Additional past medical history: Fracture left radial head 1995, epicondylitis right elbow in the past, strep throat January 2005, right upper lobe pneumonia March 2011.  C-section 1977, abdominal hysterectomy with right salpingectomy January 1998.  History of bilateral cataract extractions.  Repair of left knee medial meniscus June 2008.  Admitted December 2007 with syncope.  Had herpes zoster July 2007.  Hospitalized with probable viral gastroenteritis April 2011.  Surgery for chronic sinusitis November 2010.  History of gallstones and  hepatic steatosis.  History of iron deficiency anemia that resolved after hysterectomy.  History of noncardiac and nonpulmonary chest pain in 2011.  At the time had negative cardiac enzymes, negative EKG, negative CT angio although her D-dimer was elevated.  Last colonoscopy was in 2014 with 10-year follow-up recommended.  She only had diverticulosis during that study.  Had colonoscopy in 2009 showing severe colitis of the left colon.  She previously had panendoscopy and findings were seen in the terminal ileum that were felt to possibly be inflammatory bowel disease or from aspiration or anti-inflammatory medications.  Biopsy in June 2009 revealed ulcerative colonic mucosa with granulomas.  History of positive allergy skin test to dust mites and mold in 2009.  FEV1 was shown to improve with inhaled albuterol.  Sometimes she wheezes and needs an inhaler.  She has been patient in this practice since September 1996.  Social history: She is divorced.  Does not smoke.  Occasional alcohol consumption.  Longstanding history of morbid obesity.  Weight in 1996 was 307 pounds.  She retired as Forensic psychologist at [employer].  Family history: Father deceased with history of stroke, hypertension, diabetes and COPD.  Mother deceased with history of congestive heart failure and chronic kidney disease.  1 sister and 1 son in good health.  Dr. Pernell Dupre is her cardiologist.  Had preoperative consultation in 2017 prior to knee replacement surgery.  Last saw Dr. Pernell Dupre and July 2021.  History of subcritical LAD disease with heart catheterization 2020.  History of 60 to 70% mid LAD stenosis.  History of diabetes mellitus and recent hemoglobin A1c 6.2% TSH drawn in association with this visit  elevated at 10.24.  Was taking thyroid replacement medication with other medications and not on an empty stomach.  This is being repeated in a few weeks.  Previously TSH has been under good  control.     Review of Systems see below-has bilateral knee pain and ambulates with a cane     Objective:   Physical Exam  Blood pressure 120/80 pulse 88 pulse oximetry 93% weight 302 pounds BMI 50.26 Skin warm and dry.  No thyromegaly.  No carotid bruits.  TMs clear.  Neck supple without JVD thyromegaly.  Chest clear to auscultation.  Cardiac exam regular rate and rhythm normal S1 and S2 without murmurs or gallops.  Breast without masses.  Abdomen is obese soft nondistended with mild right upper quadrant tenderness.  No rebound tenderness appreciated.  Trace edema of the lower extremities.  Neuro is intact without focal deficits.  She is pleasant.  Affect thought and judgment are normal.     Assessment & Plan:  Morbid obesity-continue to work on diet.  Exercise restricted by knee arthritis  Essential hypertension-stable  History of bilateral pulmonary emboli treated with Xarelto  End-stage osteoarthritis requiring arthroplasty but patient has declined  Diabetes mellitus-stable  Hyperlipidemia-stable  Hypothyroidism-TSH is elevated for some reason and previously has been within normal limits.  Take on empty stomach without foods or medication and follow up in late November.  Right upper quadrant abdominal pain?  Coley cystitis.  History of cholelithiasis.  Order hepatobiliary scan  History of asthma treated with an inhaler  History of lichen planus  History of nongranulomatous colitis-last colonoscopy showed only diverticulosis  History of angioedema  GE reflux treated with PPI  History of allergic rhinitis  Recurrent low back pain  History of vitamin D deficiency  Dependent edema  Plan: Hepatobiliary scan has been ordered.  Follow-up on elevated TSH at the end of November.  If hepatobiliary scan is positive refer to surgeon.  Subjective:   Patient presents for Medicare Annual/Subsequent preventive examination.  Review Past Medical/Family/Social: See  above   Risk Factors  Current exercise habits: Not a lot of exercise due to difficulty ambulating Dietary issues discussed: Low-fat low carbohydrate  Cardiac risk factors: Hyperlipidemia, history of coronary disease, morbid obesity  Depression Screen  (Note: if answer to either of the following is "Yes", a more complete depression screening is indicated)   Over the past two weeks, have you felt down, depressed or hopeless? No  Over the past two weeks, have you felt little interest or pleasure in doing things? No Have you lost interest or pleasure in daily life? No Do you often feel hopeless? No Do you cry easily over simple problems? No   Activities of Daily Living  In your present state of health, do you have any difficulty performing the following activities?:   Driving? No  Managing money? No  Feeding yourself? No  Getting from bed to chair? No  Climbing a flight of stairs?  Yes get short of breath a lot Preparing food and eating?: No  Bathing or showering? No  Getting dressed: No  Getting to the toilet? No  Using the toilet:No  Moving around from place to place: Yes due to knees and shortness of breath In the past year have you fallen or had a near fall?:No  Are you sexually active? No  Do you have more than one partner? No   Hearing Difficulties: No  Do you often ask people to speak up or repeat themselves? No  Do you experience ringing or noises in your ears? No  Do you have difficulty understanding soft or whispered voices? No  Do you feel that you have a problem with memory? No Do you often misplace items? No    Home Safety:  Do you have a smoke alarm at your residence? Yes Do you have grab bars in the bathroom?  Yes Do you have throw rugs in your house?  Yes   Cognitive Testing  Alert? Yes Normal Appearance?Yes  Oriented to person? Yes Place? Yes  Time? Yes  Recall of three objects? Yes  Can perform simple calculations? Yes  Displays appropriate  judgment?Yes  Can read the correct time from a watch face?Yes   List the Names of Other Physician/Practitioners you currently use:  See referral list for the physicians patient is currently seeing.  Dr. Pernell Dupre   Review of Systems: See above   Objective:     General appearance: Appears stated age and morbidly obese  Head: Normocephalic, without obvious abnormality, atraumatic  Eyes: conj clear, EOMi PEERLA  Ears: normal TM's and external ear canals both ears  Nose: Nares normal. Septum midline. Mucosa normal. No drainage or sinus tenderness.  Throat: lips, mucosa, and tongue normal; teeth and gums normal  Neck: no adenopathy, no carotid bruit, no JVD, supple, symmetrical, trachea midline and thyroid not enlarged, symmetric, no tenderness/mass/nodules  No CVA tenderness.  Lungs: clear to auscultation bilaterally  Breasts: Pendulous without masses Heart: regular rate and rhythm, S1, S2 normal, no murmur, click, rub or gallop  Abdomen: Obese with mild right upper quadrant tenderness without frank rebound tenderness.  Bowel sounds normal; no masses, no organomegaly  Ambulates with a cane due to back and musculoskeletal pain in her knees Skin: Skin color, texture, turgor normal. No rashes or lesions  Lymph nodes: Cervical, supraclavicular, and axillary nodes normal.  Neurologic: CN 2 -12 Normal, Normal symmetric reflexes. Normal coordination and gait  Psych: Alert & Oriented x 3, Mood appear stable.    Assessment:    Annual wellness medicare exam   Plan:    During the course of the visit the patient was educated and counseled about appropriate screening and preventive services including:   Has had 2 Moderna Covid-19 vaccines.  Flu vaccine given in September.  Has had both Prevnar 13 and pneumococcal vaccines.     Patient Instructions (the written plan) was given to the patient.  Medicare Attestation  I have personally reviewed:  The patient's medical and social history   Their use of alcohol, tobacco or illicit drugs  Their current medications and supplements  The patient's functional ability including ADLs,fall risks, home safety risks, cognitive, and hearing and visual impairment  Diet and physical activities  Evidence for depression or mood disorders  The patient's weight, height, BMI, and visual acuity have been recorded in the chart. I have made referrals, counseling, and provided education to the patient based on review of the above and I have provided the patient with a written personalized care plan for preventive services.

## 2020-09-19 LAB — COMPLETE METABOLIC PANEL WITH GFR
AG Ratio: 1.1 (calc) (ref 1.0–2.5)
ALT: 11 U/L (ref 6–29)
AST: 14 U/L (ref 10–35)
Albumin: 3.8 g/dL (ref 3.6–5.1)
Alkaline phosphatase (APISO): 87 U/L (ref 37–153)
BUN: 17 mg/dL (ref 7–25)
CO2: 32 mmol/L (ref 20–32)
Calcium: 9.6 mg/dL (ref 8.6–10.4)
Chloride: 100 mmol/L (ref 98–110)
Creat: 0.98 mg/dL (ref 0.50–0.99)
GFR, Est African American: 69 mL/min/{1.73_m2} (ref >=60)
GFR, Est Non African American: 59 mL/min/{1.73_m2} — ABNORMAL LOW (ref >=60)
Globulin: 3.5 g/dL (calc) (ref 1.9–3.7)
Glucose, Bld: 129 mg/dL — ABNORMAL HIGH (ref 65–99)
Potassium: 4.2 mmol/L (ref 3.5–5.3)
Sodium: 140 mmol/L (ref 135–146)
Total Bilirubin: 0.5 mg/dL (ref 0.2–1.2)
Total Protein: 7.3 g/dL (ref 6.1–8.1)

## 2020-09-19 LAB — CBC WITH DIFFERENTIAL/PLATELET
Absolute Monocytes: 907 cells/uL (ref 200–950)
Basophils Absolute: 76 cells/uL (ref 0–200)
Basophils Relative: 0.7 %
Eosinophils Absolute: 421 cells/uL (ref 15–500)
Eosinophils Relative: 3.9 %
HCT: 38.4 % (ref 35.0–45.0)
Hemoglobin: 12.2 g/dL (ref 11.7–15.5)
Lymphs Abs: 3780 cells/uL (ref 850–3900)
MCH: 26.8 pg — ABNORMAL LOW (ref 27.0–33.0)
MCHC: 31.8 g/dL — ABNORMAL LOW (ref 32.0–36.0)
MCV: 84.2 fL (ref 80.0–100.0)
MPV: 11 fL (ref 7.5–12.5)
Monocytes Relative: 8.4 %
Neutro Abs: 5616 cells/uL (ref 1500–7800)
Neutrophils Relative %: 52 %
Platelets: 294 10*3/uL (ref 140–400)
RBC: 4.56 10*6/uL (ref 3.80–5.10)
RDW: 13.5 % (ref 11.0–15.0)
Total Lymphocyte: 35 %
WBC: 10.8 10*3/uL (ref 3.8–10.8)

## 2020-09-19 LAB — LIPID PANEL
Cholesterol: 98 mg/dL (ref <200)
HDL: 37 mg/dL — ABNORMAL LOW (ref >=50)
LDL Cholesterol (Calc): 42 mg/dL (calc)
Non-HDL Cholesterol (Calc): 61 mg/dL (calc) (ref <130)
Total CHOL/HDL Ratio: 2.6 (calc) (ref <5.0)
Triglycerides: 106 mg/dL (ref <150)

## 2020-09-19 LAB — TSH: TSH: 10.24 mIU/L — ABNORMAL HIGH (ref 0.40–4.50)

## 2020-09-19 LAB — HEMOGLOBIN A1C
Hgb A1c MFr Bld: 6.2 % of total Hgb — ABNORMAL HIGH (ref <5.7)
Mean Plasma Glucose: 131 (calc)
eAG (mmol/L): 7.3 (calc)

## 2020-09-19 LAB — AMYLASE: Amylase: 41 U/L (ref 21–101)

## 2020-09-22 ENCOUNTER — Other Ambulatory Visit: Payer: Self-pay | Admitting: Interventional Cardiology

## 2020-09-22 ENCOUNTER — Other Ambulatory Visit: Payer: Self-pay | Admitting: Internal Medicine

## 2020-09-22 NOTE — Telephone Encounter (Signed)
Pt last saw Dr [name] 05/27/20, last labs 09/18/20 Creat 0.98, age 69, weight 137kg, CrCl 118.83, based on CrCl pt is on appropriate dosage of Eliquis 5mg  BID.  Will refill rx.

## 2020-09-28 ENCOUNTER — Other Ambulatory Visit: Payer: Self-pay | Admitting: Internal Medicine

## 2020-10-13 ENCOUNTER — Other Ambulatory Visit: Payer: Self-pay

## 2020-10-13 ENCOUNTER — Other Ambulatory Visit: Payer: Medicare Other | Admitting: Internal Medicine

## 2020-10-13 ENCOUNTER — Encounter (HOSPITAL_COMMUNITY)
Admission: RE | Admit: 2020-10-13 | Discharge: 2020-10-13 | Disposition: A | Discharge status: Home / Self Care | Payer: Medicare Other | Source: Ambulatory Visit | Attending: Internal Medicine | Admitting: Internal Medicine

## 2020-10-13 DIAGNOSIS — R109 Unspecified abdominal pain: Secondary | ICD-10-CM | POA: Insufficient documentation

## 2020-10-13 DIAGNOSIS — R932 Abnormal findings on diagnostic imaging of liver and biliary tract: Secondary | ICD-10-CM | POA: Diagnosis not present

## 2020-10-13 DIAGNOSIS — E039 Hypothyroidism, unspecified: Secondary | ICD-10-CM | POA: Diagnosis not present

## 2020-10-13 MED ORDER — TECHNETIUM TC 99M MEBROFENIN IV KIT
4.9000 | PACK | Freq: Once | INTRAVENOUS | Status: AC | PRN
  Administered 2020-10-13: 4.9 via INTRAVENOUS

## 2020-10-14 LAB — TSH: TSH: 0.8 mIU/L (ref 0.40–4.50)

## 2020-11-10 ENCOUNTER — Other Ambulatory Visit: Payer: Self-pay | Admitting: Internal Medicine

## 2020-12-08 ENCOUNTER — Other Ambulatory Visit: Payer: Self-pay | Admitting: Internal Medicine

## 2020-12-17 ENCOUNTER — Other Ambulatory Visit: Payer: Self-pay | Admitting: Surgery

## 2020-12-17 DIAGNOSIS — K811 Chronic cholecystitis: Secondary | ICD-10-CM | POA: Diagnosis not present

## 2020-12-25 ENCOUNTER — Telehealth: Payer: Self-pay | Admitting: *Deleted

## 2020-12-25 NOTE — Telephone Encounter (Signed)
   Primary Cardiologist: Sinclair Grooms, MD  Chart reviewed as part of pre-operative protocol coverage. Because of [name]'s past medical history and time since last visit, she will require a follow-up visit in order to better assess preoperative cardiovascular risk.  Per chart review, cardiac history includes subcritical CAD disease and moderately severe pulmonary HTN felt secondary to obesity hypoventilation, PE, and patient declining to use oxygen. At last OV she was complaining of worsening exercise tolerance. Based on poor exercise capacity, she cannot be cleared via pre-op phone protocol. Furthermore at that visit it was recommended she follow-up in person in 6 months (January 2022). Do not see this scheduled.  Pre-op covering staff: - Please schedule appointment and call patient to inform them.  - Please contact requesting surgeon's office via preferred method (i.e, phone, fax) to inform them of need for appointment prior to surgery.  This message will also be routed to pharmacy pool for input on holding anticoagulant/antiplatelet agent as requested below so that this information is available to the clearing provider at time of patient's appointment. Although Xarelto is for PE it does appear Dr. [name] has been filling it.  Charlie Pitter, PA-C  12/25/2020, 2:41 PM

## 2020-12-25 NOTE — Telephone Encounter (Signed)
   [facility] Pre-operative Risk Assessment    HEARTCARE STAFF: - Please ensure there is not already an duplicate clearance open for this procedure. - Under Visit Info/Reason for Call, type in Other and utilize the format Clearance MM/DD/YY or Clearance TBD. Do not use dashes or single digits. - If request is for dental extraction, please clarify the # of teeth to be extracted.  Request for surgical clearance:  1. What type of surgery is being performed? LAP CHOLE   2. When is this surgery scheduled? TBD   3. What type of clearance is required (medical clearance vs. Pharmacy clearance to hold med vs. Both)? BOTH  4. Are there any medications that need to be held prior to surgery and how long? [place]   5. Practice name and name of physician performing surgery? [facility]; DR. Coralie Keens   6. What is the office phone number? [phone]   7.   What is the office fax number? [place]: Malachi Bonds, CMA  8.   Anesthesia type (None, local, MAC, general) ? GENRAL   Julaine Hua 12/25/2020, 12:49 PM  _________________________________________________________________   (provider comments below)

## 2020-12-25 NOTE — Telephone Encounter (Signed)
Patient with diagnosis of PE in 2018 on Xarelto for anticoagulation. Pt previously followed with Dr Waymon Budge, 212/2019 note as below:  "CT angiogram the same date showed multiple pulmonary emboli in the right lung. Lower extremity Dopplers did not show any clot in the leg veins but her history suggested that she may have had a clot in the right calf. She had no other obvious risk factors for thrombosis. No family history of clotting. I felt that at least 6 to 12 months of full dose anticoagulation was indicated.   We had a long discussion today about the implications of provoked versus unprovoked emboli.  Although there is a suspicion that her pulmonary emboli may have originated in a calf vein, it was still an unprovoked event.  We know for sure that unprovoked thrombotic events are likely to recur at a high frequency when anticoagulation is discontinued.  In addition, she has fixed risk factors which are not going to change over time with respect to her weight, age, and sedentary lifestyle. The clinical trials that looked at dose reduction in people at low to moderate risk for recurrence excluded people at high risk.  In addition, none of the trials looking at the new oral anticoagulants had a significant percentage of patients at extremes of weight either low or high.  She weighs almost 311 pounds.  I would be reluctant to decrease her Xarelto dose.  In my opinion, she should remain on long-term full dose anticoagulation."  Procedure: lap chole Date of procedure: TBD  CrCl 90mL/min Platelet count 294K  Will defer to MD for input on whether pt is at acceptable risk to hold Xarelto for 2-3 days prior to procedure.

## 2020-12-26 NOTE — Telephone Encounter (Signed)
Recommend bridge

## 2020-12-28 NOTE — Telephone Encounter (Signed)
I have faxed to requesting surgeons office via [place] fax to make them aware.

## 2020-12-28 NOTE — Telephone Encounter (Signed)
Called pt to discuss Lovenox bridge which will need to be coordinated once her procedure date is set. Pt states procedure date is not set yet and that she has also used Lovenox in the past and is comfortable giving herself injections.  Provided pt with my direct # which she will call once procedure date is set. Will plan to coordinate Lovenox bridge over the phone.

## 2020-12-28 NOTE — Telephone Encounter (Signed)
Routing note to [employer], PA-C. Appt scheduled for 01/19/21 for surgical clearance. This note will be removed from the preop pool.  Richardson Dopp, PA-C    12/28/2020 12:31 PM

## 2020-12-28 NOTE — Telephone Encounter (Signed)
Spoke with patient and scheduled her for a pre-op clearance appointment on 01/19/21 at 10:45 AM with Vin Bhagat.

## 2021-01-19 ENCOUNTER — Ambulatory Visit (INDEPENDENT_AMBULATORY_CARE_PROVIDER_SITE_OTHER): Payer: Medicare Other | Admitting: Physician Assistant

## 2021-01-19 ENCOUNTER — Other Ambulatory Visit: Payer: Self-pay

## 2021-01-19 ENCOUNTER — Encounter: Payer: Self-pay | Admitting: Physician Assistant

## 2021-01-19 VITALS — BP 116/67 | HR 60 | Ht 65.0 in | Wt 290.0 lb

## 2021-01-19 DIAGNOSIS — I10 Essential (primary) hypertension: Secondary | ICD-10-CM

## 2021-01-19 DIAGNOSIS — I25118 Atherosclerotic heart disease of native coronary artery with other forms of angina pectoris: Secondary | ICD-10-CM | POA: Diagnosis not present

## 2021-01-19 DIAGNOSIS — I259 Chronic ischemic heart disease, unspecified: Secondary | ICD-10-CM

## 2021-01-19 DIAGNOSIS — Z01810 Encounter for preprocedural cardiovascular examination: Secondary | ICD-10-CM | POA: Diagnosis not present

## 2021-01-19 NOTE — Patient Instructions (Signed)
Medication Instructions:  Your physician recommends that you continue on your current medications as directed. Please refer to the Current Medication list given to you today.  *If you need a refill on your cardiac medications before your next appointment, please call your pharmacy*   Lab Work: None ordered  If you have labs (blood work) drawn today and your tests are completely normal, you will receive your results only by: Marland Kitchen MyChart Message (if you have MyChart) OR . A paper copy in the mail If you have any lab test that is abnormal or we need to change your treatment, we will call you to review the results.   Testing/Procedures: None ordered   Follow-Up: At [facility], you and your health needs are our priority.  As part of our continuing mission to provide you with exceptional heart care, we have created designated Provider Care Teams.  These Care Teams include your primary Cardiologist (physician) and Advanced Practice Providers (APPs -  Physician Assistants and Nurse Practitioners) who all work together to provide you with the care you need, when you need it.  We recommend signing up for the patient portal called "MyChart".  Sign up information is provided on this After Visit Summary.  MyChart is used to connect with patients for Virtual Visits (Telemedicine).  Patients are able to view lab/test results, encounter notes, upcoming appointments, etc.  Non-urgent messages can be sent to your provider as well.   To learn more about what you can do with MyChart, go to [email or web address].    Your next appointment:   6 month(s)  The format for your next appointment:   In Person  Provider:   You may see Sinclair Grooms, MD or one of the following Advanced Practice Providers on your designated Care Team:    Kathyrn Drown, NP    Other Instructions

## 2021-01-19 NOTE — Progress Notes (Signed)
Cardiology Office Note:    Date:  01/19/2021   ID:  [name], [name] August 20, 1951, MRN [id number]  PCP:  Elby Showers, MD  [facility] Cardiologist:  Sinclair Grooms, MD  [place] Electrophysiologist:  None   1. Chief Complaint: surgical clearance for LAP CHOLE    History of Present Illness:    [name] is a 70 y.o. female with a hx of medically managed CAD, hypertension, diabetes mellitus, obstructive sleep apnea on CPAP, history of pulmonary embolism, moderately severe pulmonary HTN felt secondary to obesity hypoventilation seen for surgical clearance.  Hx of large-volume right-sided pulmonary emboli in March 2018. Dr. [name] (oncology) recommended life long anticoagulation.   Cardiac catheterization March 2020 showed 60 to 70% mid LAD stenosis with 0.91 DFR and 0.84 FFR.  Medical therapy recommended. Echocardiogram March 2020 with LV function of 60 to 09%, normal diastolic parameter, mildly reduced RV function, RVSP 71.7 mmHg.  Last seen by Dr. [name] 05/2020.  Here today for surgical clearance. Uses rolator for ambulation. Activity limited 2nd to obesity and knee issue. No chest pain. Has stable breathing for months. No chest pain. Denies palpitations, orthopnea, PND or syncope. Has intermittent RUQ pain and nausea, especially in morning.   Past Medical History:  Diagnosis Date  . Anemia    none recently  . Anxiety   . Arthritis   . Asthma   . Complete tear of right rotator cuff 12/05/2014  . Depression   . Diabetes mellitus    not on meds.denies a1c 5.8  . Diverticulitis   . Diverticulosis   . Family history of adverse reaction to anesthesia    mom  nausea and vomiting  . GERD (gastroesophageal reflux disease)   . Hypertension   . Hypothyroidism   . Migraine   . Personal history of kidney stones   . Pneumonia    hx  . Pulmonary embolism ([place])    a. diagnosed in 01/2017 w/ imaging showing multiple right lung PE with right  heart strain. Started on Xarelto.   . Shortness of breath dyspnea    exersion  . Thyroid disease    hypo  . Wears glasses     Past Surgical History:  Procedure Laterality Date  . ABDOMINAL HYSTERECTOMY    . CATARACT EXTRACTION, BILATERAL    . CESAREAN SECTION    . COLONOSCOPY     multiple   . ESOPHAGOGASTRODUODENOSCOPY ENDOSCOPY     multiple  . INTRAVASCULAR PRESSURE WIRE/FFR STUDY N/A 01/14/2019   Procedure: INTRAVASCULAR PRESSURE WIRE/FFR STUDY;  Surgeon: Belva Crome, MD;  Location: [facility];  Service: Cardiovascular;  Laterality: N/A;  . KNEE CARTILAGE SURGERY     Left  . LAPAROSCOPIC PARTIAL COLECTOMY N/A 04/22/2013   Procedure: LAPAROSCOPIC PARTIAL COLECTOMY;  Surgeon: Odis Hollingshead, MD;  Location: [facility];  Service: General;  Laterality: N/A;  . LEFT HEART CATH AND CORONARY ANGIOGRAPHY N/A 01/14/2019   Procedure: LEFT HEART CATH AND CORONARY ANGIOGRAPHY;  Surgeon: Belva Crome, MD;  Location: [facility];  Service: Cardiovascular;  Laterality: N/A;  . right foot surgery  little toe and next toe   x 3  . SHOULDER ARTHROSCOPY WITH ROTATOR CUFF REPAIR AND SUBACROMIAL DECOMPRESSION Right 12/05/2014   Procedure: RIGHT SHOULDER ARTHROSCOPY,ACROMOPLASTY, ROTATOR CUFF REPAIR;  Surgeon: [name], MD;  Location: [facility];  Service: Orthopedics;  Laterality: Right;    Current Medications: Current Meds  Medication Sig  .  ALPRAZolam (XANAX) 0.5 MG tablet TAKE 1 TABLET BY MOUTH TWICE A DAY AS NEEDED  . atorvastatin (LIPITOR) 80 MG tablet TAKE 1 TABLET BY MOUTH EVERY DAY AT 6:00 PM  . cetirizine (ZYRTEC) 10 MG tablet Take 10 mg by mouth at bedtime.  . Cholecalciferol (VITAMIN D3) 5000 units CAPS Take 5,000 Units by mouth daily.  . clobetasol ointment (TEMOVATE) 5.36 % Apply 1 application topically 2 (two) times daily as needed (skin irritation).  . clotrimazole (MYCELEX) 10 MG troche Take 10 mg by mouth 3 (three) times daily as needed  (candidiasis).   Marland Kitchen EPINEPHrine 0.3 mg/0.3 mL IJ SOAJ injection Inject 0.3 mLs (0.3 mg total) into the muscle as needed for anaphylaxis.  Marland Kitchen escitalopram (LEXAPRO) 10 MG tablet TAKE 1 TABLET (10 MG TOTAL) DAILY BY MOUTH.  Marland Kitchen esomeprazole (NEXIUM) 40 MG capsule Take 40 mg by mouth daily at 12 noon.  . famotidine (PEPCID) 10 MG tablet Take 10 mg by mouth at bedtime.  . furosemide (LASIX) 20 MG tablet TAKE 1 TABLET BY MOUTH EVERY DAY  . levothyroxine (SYNTHROID) 200 MCG tablet TAKE 1 TABLET BY MOUTH EVERY DAY ON EMPTY STOMACH BEFORE BREAKFAST  . magnesium oxide (MAG-OX) 400 MG tablet Take 1 tablet by mouth on Tuesdays, Thursdays, Saturdays, & Sundays.  Take 1 tablet by mouth twice a day on Mondays, Wednesdays, & Fridays  . metoprolol tartrate (LOPRESSOR) 25 MG tablet TAKE 1 TABLET BY MOUTH EVERY DAY IN THE MORNING  . montelukast (SINGULAIR) 10 MG tablet TAKE 1 TABLET BY MOUTH EVERYDAY AT BEDTIME  . Multiple Vitamins-Minerals (PRESERVISION AREDS 2 PO) Take by mouth.  . olmesartan (BENICAR) 40 MG tablet TAKE 1 TABLET BY MOUTH EVERY DAY  . traZODone (DESYREL) 150 MG tablet TAKE 1 TABLET (150 MG TOTAL) BY MOUTH AT BEDTIME.  . VENTOLIN HFA 108 (90 Base) MCG/ACT inhaler USE 2 PUFFS EVERY 6 HOURS AS NEEDED FOR WHEEZING  . XARELTO 20 MG TABS tablet TAKE 1 TABLET (20 MG TOTAL) BY MOUTH DAILY WITH SUPPER.  Marland Kitchen zolpidem (AMBIEN) 10 MG tablet TAKE 1 TABLET BY MOUTH AT BEDTIME AS NEEDED.     Allergies:   Aspirin, Lisinopril, Bee venom, and Morphine and related   Social History   Socioeconomic History  . Marital status: Single    Spouse name: Not on file  . Number of children: Not on file  . Years of education: Not on file  . Highest education level: Not on file  Occupational History  . Not on file  Tobacco Use  . Smoking status: Former Smoker    Packs/day: 0.50    Years: 6.00    Pack years: 3.00    Types: Cigarettes    Quit date: 11/14/1982    Years since quitting: 38.2  . Smokeless tobacco: Never  Used  . Tobacco comment: quit smoking 30 years ago  Vaping Use  . Vaping Use: Never used  Substance and Sexual Activity  . Alcohol use: No  . Drug use: No  . Sexual activity: Not on file  Other Topics Concern  . Not on file  Social History Narrative  . Not on file   Social Determinants of Health   Financial Resource Strain: Not on file  Food Insecurity: Not on file  Transportation Needs: Not on file  Physical Activity: Not on file  Stress: Not on file  Social Connections: Not on file     Family History: The patient's family history includes Breast cancer in her paternal aunt; Diabetes in  her father; Heart disease in her mother; Kidney failure in her father. There is no history of Colon cancer.    ROS:   Please see the history of present illness.    All other systems reviewed and are negative.  EKGs/Labs/Other Studies Reviewed:    The following studies were reviewed today:  INTRAVASCULAR PRESSURE WIRE/FFR STUDY  LEFT HEART CATH AND CORONARY ANGIOGRAPHY    Conclusion   60 to 70% mid LAD with both DFR and FFR (0.91 and 0.84 respectively) documenting absence of hemodynamic significance.  Normal left main  Normal circumflex  Normal RCA  Normal LV function and LVEDP.  EF 65%  RECOMMENDATIONS:   Possible allergy to aspirin  Aggressive risk factor modification.  Started high intensity statin therapy.  Other aggressive secondary risk modification measures will also be stressed.  Resume Xarelto greater than 12 hours after sheath pull from radial artery  Needs clinical follow-up and if has convincing symptoms of angina in the future, the LAD would be reasonably easy to treat with percutaneous therapy. Diagnostic Dominance: Right    Echo 01/2019 1. The left ventricle has normal systolic function with an ejection  fraction of 60-65%. The cavity size was normal. Left ventricular diastolic  parameters were normal.  2. The right ventricle has mildly reduced  systolic function. The cavity  was moderately enlarged. There is no increase in right ventricular wall  thickness. Right ventricular systolic pressure is severely elevated with  an estimated pressure of 71.7 mmHg.  3. The mitral valve is normal in structure. There is mild mitral annular  calcification present.  4. The tricuspid valve is normal in structure.  5. The aortic valve is tricuspid.  6. The pulmonic valve was normal in structure.  7. The inferior vena cava was dilated in size with >50% respiratory  variability.  8. The interatrial septum appears to be lipomatous.   EKG:  EKG is  ordered today.  The ekg ordered today demonstrates SR at rate of 60 bpm  Recent Labs: 03/16/2020: Brain Natriuretic Peptide 26 03/18/2020: Magnesium 1.6 09/18/2020: ALT 11; BUN 17; Creat 0.98; Hemoglobin 12.2; Platelets 294; Potassium 4.2; Sodium 140 10/13/2020: TSH 0.80  Recent Lipid Panel    Component Value Date/Time   CHOL 98 09/18/2020 1109   CHOL 102 05/08/2019 1013   TRIG 106 09/18/2020 1109   HDL 37 (L) 09/18/2020 1109   HDL 43 05/08/2019 1013   CHOLHDL 2.6 09/18/2020 1109   VLDL 39 (H) 05/25/2016 1213   LDLCALC 42 09/18/2020 1109    Physical Exam:    VS:  BP 116/67   Pulse 60   Ht 5\' 5"  (1.651 m)   Wt 290 lb (131.5 kg)   SpO2 90%   BMI 48.26 kg/m     Wt Readings from Last 3 Encounters:  01/19/21 290 lb (131.5 kg)  09/18/20 (!) 302 lb (137 kg)  05/27/20 (!) 310 lb 3.2 oz (140.7 kg)     GEN: Obese female in no acute distress HEENT: Normal NECK: No JVD; No carotid bruits LYMPHATICS: No lymphadenopathy CARDIAC: RRR, no murmurs, rubs, gallops RESPIRATORY:  Clear to auscultation without rales, wheezing or rhonchi  ABDOMEN: Soft, non-tender, non-distended MUSCULOSKELETAL:  No edema; No deformity  SKIN: Warm and dry NEUROLOGIC:  Alert and oriented x 3 PSYCHIATRIC:  Normal affect   ASSESSMENT AND PLAN:    1. CAD Cath in 2020 as above >> recommended medical therapy.   No chest pain  Not on ASA due to need of  anticoagulation.  Continue statin and BB.   2.  Hypertension Blood pressure stable and controlled on current medication  3.  Surgical clearance Activity limited secondary to obesity and knee issue.  She is getting less than 4 METS of activity.  Reviewed with Dr. [name].  Patient with known  LAD stenosis.  She is not having chest pain or symptoms similar to prior angina when required cardiac cath.  EKG without acute ischemic changes today.   Given past medical history and time since last visit, based on ACC/AHA guidelines, Breella Vanostrand would be at acceptable risk for the planned procedure without further cardiovascular testing.   Per pharmacy "acceptable risk to hold Xarelto for 2-3 days prior to procedure" however patient will need Lovenox bridge which will need to be coordinated once her procedure date is set.   Medication Adjustments/Labs and Tests Ordered: Current medicines are reviewed at length with the patient today.  Concerns regarding medicines are outlined above.  Orders Placed This Encounter  Procedures  . EKG 12-Lead   No orders of the defined types were placed in this encounter.   Patient Instructions  Medication Instructions:  Your physician recommends that you continue on your current medications as directed. Please refer to the Current Medication list given to you today.  *If you need a refill on your cardiac medications before your next appointment, please call your pharmacy*   Lab Work: None ordered  If you have labs (blood work) drawn today and your tests are completely normal, you will receive your results only by: Marland Kitchen MyChart Message (if you have MyChart) OR . A paper copy in the mail If you have any lab test that is abnormal or we need to change your treatment, we will call you to review the results.   Testing/Procedures: None ordered   Follow-Up: At [facility], you and your health needs are our  priority.  As part of our continuing mission to provide you with exceptional heart care, we have created designated Provider Care Teams.  These Care Teams include your primary Cardiologist (physician) and Advanced Practice Providers (APPs -  Physician Assistants and Nurse Practitioners) who all work together to provide you with the care you need, when you need it.  We recommend signing up for the patient portal called "MyChart".  Sign up information is provided on this After Visit Summary.  MyChart is used to connect with patients for Virtual Visits (Telemedicine).  Patients are able to view lab/test results, encounter notes, upcoming appointments, etc.  Non-urgent messages can be sent to your provider as well.   To learn more about what you can do with MyChart, go to [email or web address].    Your next appointment:   6 month(s)  The format for your next appointment:   In Person  Provider:   You may see Sinclair Grooms, MD or one of the following Advanced Practice Providers on your designated Care Team:    Kathyrn Drown, NP    Other Instructions      Signed, Leanor Kail, [place]  01/19/2021 11:02 AM    [place]

## 2021-02-02 MED ORDER — ENOXAPARIN SODIUM 120 MG/0.8ML ~~LOC~~ SOLN: 120.0000 mg | Freq: Two times a day (BID) | SUBCUTANEOUS | 0 refills | Status: DC

## 2021-02-12 ENCOUNTER — Other Ambulatory Visit (HOSPITAL_COMMUNITY)
Admission: RE | Admit: 2021-02-12 | Discharge: 2021-02-12 | Disposition: A | Discharge status: Home / Self Care | Payer: Medicare Other | Source: Ambulatory Visit | Attending: Surgery | Admitting: Surgery

## 2021-02-12 DIAGNOSIS — Z20822 Contact with and (suspected) exposure to covid-19: Secondary | ICD-10-CM | POA: Insufficient documentation

## 2021-02-12 DIAGNOSIS — Z01812 Encounter for preprocedural laboratory examination: Secondary | ICD-10-CM | POA: Insufficient documentation

## 2021-02-13 LAB — SARS CORONAVIRUS 2 (TAT 6-24 HRS): SARS Coronavirus 2: NEGATIVE

## 2021-02-15 ENCOUNTER — Encounter (HOSPITAL_COMMUNITY): Payer: Self-pay | Admitting: Surgery

## 2021-02-15 ENCOUNTER — Other Ambulatory Visit: Payer: Self-pay

## 2021-02-15 NOTE — H&P (Signed)
[name]  Location: [facility] Patient #: [phone] DOB: 1950-12-04 Single / Language: [name] / Race: White Female   History of Present Illness  The patient is a 70 year old female who presents for evaluation of gall stones.  Chief complaint: Symptomatic gallstones  This is a 70 year old female multiple comorbidities including a history of pulmonary embolism on Xeralto. She has had known gallstones. She had an attack of right upper quadrant abdominal pain in early November. She had laboratory data performed showing a normal white count and normal LFTs. She then underwent a HIDA scan on October 13, 2020 showed nonvisualization of the gallbladder. Since then, she has been on low-fat diet and has only mild occasional right upper quadrant abdominal discomfort. She has also had an abdominal hysterectomy as well as a partial colectomy for diverticulitis in the past.   Past Surgical History  Cataract Surgery  Bilateral. Cesarean Section - 1  Foot Surgery  Right. Knee Surgery  Right. Resection of Small Bowel  Shoulder Surgery  Right.  Diagnostic Studies History  Colonoscopy  5-10 years ago Mammogram  1-3 years ago Pap Smear  1-5 years ago  Allergies Malachi Bonds, CMA;  Morphine Sulfate *ANALGESICS - OPIOID*   Medication History (Chemira Jones, CMA; ALPRAZolam (0.5MG  Tablet, Oral) Active. Zolpidem Tartrate (10MG  Tablet, Oral) Active. Atorvastatin Calcium (80MG  Tablet, Oral) Active. Escitalopram Oxalate (10MG  Tablet, Oral) Active. Furosemide (20MG  Tablet, Oral) Active. Levothyroxine Sodium (200MCG Tablet, Oral) Active. Magnesium Oxide (400MG  Tablet, Oral) Active. Metoprolol Tartrate (25MG  Tablet, Oral) Active. Montelukast Sodium (10MG  Tablet, Oral) Active. Olmesartan Medoxomil (40MG  Tablet, Oral) Active. traZODone HCl (150MG  Tablet, Oral) Active. EPINEPHrine (0.3MG /0.3ML Soln Auto-inj, Injection) Active. Xarelto (20MG  Tablet,  Oral) Active. Medications Reconciled  Social History Malachi Bonds, CMA;  Alcohol use  Occasional alcohol use. Caffeine use  Carbonated beverages, Tea. No drug use  Tobacco use  Former smoker.  Family History Malachi Bonds, CMA; 2 Arthritis  Father, Mother, Sister. Bleeding disorder  Mother. Cerebrovascular Accident  Mother. Depression  Sister. Diabetes Mellitus  Father. Heart Disease  Mother, Sister. Heart disease in female family member before age 71  Hypertension  Father. Kidney Disease  Father. Migraine Headache  Mother. Respiratory Condition  Father. Thyroid problems  Mother.  Pregnancy / Birth History Malachi Bonds, CMA; Age at menarche  70 years. Gravida  1 Maternal age  53-25 Para  66  Other Problems Malachi Bonds, CMA;  Asthma  Cholelithiasis  Depression  Diabetes Mellitus  Diverticulosis  Gastroesophageal Reflux Disease  High blood pressure  Migraine Headache  Pulmonary Embolism / Blood Clot in Legs  Sleep Apnea  Thyroid Disease     Review of Systems General Not Present- Appetite Loss, Chills, Fatigue, Fever, Night Sweats, Weight Gain and Weight Loss. Skin Present- Dryness. Not Present- Change in Wart/Mole, Hives, Jaundice, New Lesions, Non-Healing Wounds, Rash and Ulcer. HEENT Present- Seasonal Allergies and Wears glasses/contact lenses. Not Present- Earache, Hearing Loss, Hoarseness, Nose Bleed, Oral Ulcers, Ringing in the Ears, Sinus Pain, Sore Throat, Visual Disturbances and Yellow Eyes. Respiratory Present- Chronic Cough. Not Present- Bloody sputum, Difficulty Breathing, Snoring and Wheezing. Breast Not Present- Breast Mass, Breast Pain, Nipple Discharge and Skin Changes. Cardiovascular Present- Shortness of Breath and Swelling of Extremities. Not Present- Chest Pain, Difficulty Breathing Lying Down, Leg Cramps, Palpitations and Rapid Heart Rate. Gastrointestinal Present- Abdominal Pain. Not Present- Bloating, Bloody  Stool, Change in Bowel Habits, Chronic diarrhea, Constipation, Difficulty Swallowing, Excessive gas, Gets full quickly at meals, Hemorrhoids, Indigestion, Nausea, Rectal  Pain and Vomiting. Female Genitourinary Not Present- Frequency, Nocturia, Painful Urination, Pelvic Pain and Urgency. Musculoskeletal Present- Joint Pain and Joint Stiffness. Not Present- Back Pain, Muscle Pain, Muscle Weakness and Swelling of Extremities. Neurological Present- Trouble walking. Not Present- Decreased Memory, Fainting, Headaches, Numbness, Seizures, Tingling, Tremor and Weakness. Psychiatric Present- Depression. Not Present- Anxiety, Bipolar, Change in Sleep Pattern, Fearful and Frequent crying. Endocrine Not Present- Cold Intolerance, Excessive Hunger, Hair Changes, Heat Intolerance, Hot flashes and New Diabetes. Hematology Present- Blood Thinners. Not Present- Easy Bruising, Excessive bleeding, Gland problems, HIV and Persistent Infections.  Vitals   Weight: 298 lb Height: 65in Body Surface Area: 2.34 m Body Mass Index: 49.59 kg/m        Physical Exam The physical exam findings are as follows: Note: She appears well and comfortable exam  He is walking with a walker  Her abdomen is obese. It is soft. There is tenderness to deep palpation in the right upper quadrant.    Assessment & Plan  CHRONIC CHOLECYSTITIS (K81.1)  Impression: I reviewed her notes in the electronic medical records. I reviewed her previous imaging including a HIDA scan. She looks surprisingly well today despite nonvisualization of the gallbladder on HIDA scan November. I suspect she has chronic cholecystitis with her gallstones. Given this and her comorbidities, laparoscopic cholecystectomy is strongly recommended. She would need to stop her blood thinning medication for 2 days preoperatively. We would also need cardiac clearance. I discussed gallbladder disease with her in detail and gave her literature regarding this.  She is well aware of gallstones and wishes to proceed with surgery as well. I discussed the procedure in detail. The patient was given Neurosurgeon. We discussed the risks and benefits of a laparoscopic cholecystectomy and possible cholangiogram including, but not limited to, bleeding, infection, injury to surrounding structures such as the intestine or liver, bile leak, retained gallstones, need to convert to an open procedure, prolonged diarrhea, blood clots such as DVT, common bile duct injury, anesthesia risks, and possible need for additional procedures. Because of the suspected significant chronic cholecystitis, I would like to do this an inpatient facility for risk of conversion to an open procedure.

## 2021-02-15 NOTE — Progress Notes (Signed)
[name] denies chest pain or shortness of breath. Patient was tested for Covid and has been in quarantine since that time.   [name] has type II diabetes, patient does not check CBGs  and does not take any medications.  [name] voiced stress due to the fact that she is just receiving a call regarding surgery in am. [name] reports that she was told that there are no appointments, but that she will be called by the end of last week.  I asked [name] if she would like the phone number for patient experience, patient reported that she spoke to someone in that department earlier today. I told [name] that I am sorry that she was  sorry that she was not given accurate information regarding per- op calls.

## 2021-02-16 ENCOUNTER — Ambulatory Visit (HOSPITAL_COMMUNITY): Payer: Medicare Other | Admitting: Anesthesiology

## 2021-02-16 ENCOUNTER — Encounter (HOSPITAL_COMMUNITY)
Admission: RE | Disposition: A | Discharge status: Home / Self Care | Payer: Self-pay | Source: Home / Self Care | Attending: Surgery

## 2021-02-16 ENCOUNTER — Encounter (HOSPITAL_COMMUNITY): Payer: Self-pay | Admitting: Surgery

## 2021-02-16 ENCOUNTER — Other Ambulatory Visit: Payer: Self-pay

## 2021-02-16 ENCOUNTER — Ambulatory Visit (HOSPITAL_COMMUNITY)
Admission: RE | Admit: 2021-02-16 | Discharge: 2021-02-16 | Disposition: A | Discharge status: Home / Self Care | Payer: Medicare Other | Attending: Surgery | Admitting: Surgery

## 2021-02-16 DIAGNOSIS — Z86711 Personal history of pulmonary embolism: Secondary | ICD-10-CM | POA: Insufficient documentation

## 2021-02-16 DIAGNOSIS — Z9049 Acquired absence of other specified parts of digestive tract: Secondary | ICD-10-CM | POA: Insufficient documentation

## 2021-02-16 DIAGNOSIS — Z90710 Acquired absence of both cervix and uterus: Secondary | ICD-10-CM | POA: Diagnosis not present

## 2021-02-16 DIAGNOSIS — Z7901 Long term (current) use of anticoagulants: Secondary | ICD-10-CM | POA: Diagnosis not present

## 2021-02-16 DIAGNOSIS — Z8719 Personal history of other diseases of the digestive system: Secondary | ICD-10-CM | POA: Insufficient documentation

## 2021-02-16 DIAGNOSIS — Z833 Family history of diabetes mellitus: Secondary | ICD-10-CM | POA: Insufficient documentation

## 2021-02-16 DIAGNOSIS — I10 Essential (primary) hypertension: Secondary | ICD-10-CM | POA: Diagnosis not present

## 2021-02-16 DIAGNOSIS — E876 Hypokalemia: Secondary | ICD-10-CM | POA: Diagnosis not present

## 2021-02-16 DIAGNOSIS — K219 Gastro-esophageal reflux disease without esophagitis: Secondary | ICD-10-CM | POA: Insufficient documentation

## 2021-02-16 DIAGNOSIS — G4730 Sleep apnea, unspecified: Secondary | ICD-10-CM | POA: Insufficient documentation

## 2021-02-16 DIAGNOSIS — Z885 Allergy status to narcotic agent status: Secondary | ICD-10-CM | POA: Diagnosis not present

## 2021-02-16 DIAGNOSIS — E119 Type 2 diabetes mellitus without complications: Secondary | ICD-10-CM | POA: Diagnosis not present

## 2021-02-16 DIAGNOSIS — Z87891 Personal history of nicotine dependence: Secondary | ICD-10-CM | POA: Diagnosis not present

## 2021-02-16 DIAGNOSIS — K8010 Calculus of gallbladder with chronic cholecystitis without obstruction: Secondary | ICD-10-CM | POA: Insufficient documentation

## 2021-02-16 HISTORY — DX: Personal history of urinary calculi: Z87.442

## 2021-02-16 HISTORY — DX: Atherosclerotic heart disease of native coronary artery without angina pectoris: I25.10

## 2021-02-16 HISTORY — PX: CHOLECYSTECTOMY: SHX55

## 2021-02-16 LAB — CBC
HCT: 43.2 % (ref 36.0–46.0)
Hemoglobin: 12.8 g/dL (ref 12.0–15.0)
MCH: 26.1 pg (ref 26.0–34.0)
MCHC: 29.6 g/dL — ABNORMAL LOW (ref 30.0–36.0)
MCV: 88.2 fL (ref 80.0–100.0)
Platelets: 280 10*3/uL (ref 150–400)
RBC: 4.9 MIL/uL (ref 3.87–5.11)
RDW: 14.7 % (ref 11.5–15.5)
WBC: 11.2 10*3/uL — ABNORMAL HIGH (ref 4.0–10.5)
nRBC: 0 % (ref 0.0–0.2)

## 2021-02-16 LAB — BASIC METABOLIC PANEL
Anion gap: 8 (ref 5–15)
BUN: 15 mg/dL (ref 8–23)
CO2: 30 mmol/L (ref 22–32)
Calcium: 8.8 mg/dL — ABNORMAL LOW (ref 8.9–10.3)
Chloride: 101 mmol/L (ref 98–111)
Creatinine, Ser: 1.03 mg/dL — ABNORMAL HIGH (ref 0.44–1.00)
GFR, Estimated: 59 mL/min — ABNORMAL LOW (ref >60)
Glucose, Bld: 124 mg/dL — ABNORMAL HIGH (ref 70–99)
Potassium: 3.5 mmol/L (ref 3.5–5.1)
Sodium: 139 mmol/L (ref 135–145)

## 2021-02-16 LAB — GLUCOSE, CAPILLARY
Glucose-Capillary: 126 mg/dL — ABNORMAL HIGH (ref 70–99)
Glucose-Capillary: 103 mg/dL — ABNORMAL HIGH (ref 70–99)
Glucose-Capillary: 154 mg/dL — ABNORMAL HIGH (ref 70–99)

## 2021-02-16 SURGERY — LAPAROSCOPIC CHOLECYSTECTOMY
Anesthesia: General | Site: Abdomen

## 2021-02-16 MED ORDER — CHLORHEXIDINE GLUCONATE CLOTH 2 % EX PADS: 6.0000 | MEDICATED_PAD | Freq: Once | CUTANEOUS | Status: DC

## 2021-02-16 MED ORDER — ONDANSETRON HCL 4 MG/2ML IJ SOLN
INTRAMUSCULAR | Status: DC | PRN
  Administered 2021-02-16: 4 mg via INTRAVENOUS

## 2021-02-16 MED ORDER — KETAMINE HCL 10 MG/ML IJ SOLN
INTRAMUSCULAR | Status: DC | PRN
  Administered 2021-02-16: 20 mg via INTRAVENOUS
  Administered 2021-02-16: 10 mg via INTRAVENOUS

## 2021-02-16 MED ORDER — CEFAZOLIN SODIUM-DEXTROSE 2-4 GM/100ML-% IV SOLN
2.0000 g | INTRAVENOUS | Status: AC
  Administered 2021-02-16: 2 g via INTRAVENOUS
  Filled 2021-02-16: qty 100

## 2021-02-16 MED ORDER — PROPOFOL 10 MG/ML IV BOLUS
INTRAVENOUS | Status: AC
  Filled 2021-02-16: qty 20

## 2021-02-16 MED ORDER — ROCURONIUM BROMIDE 100 MG/10ML IV SOLN
INTRAVENOUS | Status: DC | PRN
  Administered 2021-02-16: 50 mg via INTRAVENOUS

## 2021-02-16 MED ORDER — ACETAMINOPHEN 500 MG PO TABS
1000.0000 mg | ORAL_TABLET | ORAL | Status: AC
  Administered 2021-02-16: 1000 mg via ORAL
  Filled 2021-02-16: qty 2

## 2021-02-16 MED ORDER — EPHEDRINE SULFATE-NACL 50-0.9 MG/10ML-% IV SOSY
PREFILLED_SYRINGE | INTRAVENOUS | Status: DC | PRN
  Administered 2021-02-16 (×2): 10 mg via INTRAVENOUS

## 2021-02-16 MED ORDER — SODIUM CHLORIDE 0.9 % IR SOLN
Status: DC | PRN
  Administered 2021-02-16: 1000 mL

## 2021-02-16 MED ORDER — ACETAMINOPHEN 325 MG PO TABS
325.0000 mg | ORAL_TABLET | ORAL | Status: DC | PRN
Start: 2021-02-16 — End: 2021-02-16

## 2021-02-16 MED ORDER — PROPOFOL 10 MG/ML IV BOLUS
INTRAVENOUS | Status: DC | PRN
  Administered 2021-02-16: 20 mg via INTRAVENOUS
  Administered 2021-02-16: 200 mg via INTRAVENOUS

## 2021-02-16 MED ORDER — BUPIVACAINE-EPINEPHRINE 0.25% -1:200000 IJ SOLN
INTRAMUSCULAR | Status: DC | PRN
  Administered 2021-02-16: 20 mL

## 2021-02-16 MED ORDER — OXYCODONE HCL 5 MG PO TABS: 5.0000 mg | ORAL_TABLET | Freq: Four times a day (QID) | ORAL | 0 refills | Status: DC | PRN

## 2021-02-16 MED ORDER — OXYCODONE HCL 5 MG/5ML PO SOLN: 5.0000 mg | Freq: Once | ORAL | Status: AC | PRN

## 2021-02-16 MED ORDER — KETAMINE HCL 50 MG/5ML IJ SOSY
PREFILLED_SYRINGE | INTRAMUSCULAR | Status: AC
  Filled 2021-02-16: qty 5

## 2021-02-16 MED ORDER — LIDOCAINE 2% (20 MG/ML) 5 ML SYRINGE
INTRAMUSCULAR | Status: DC | PRN
  Administered 2021-02-16: 100 mg via INTRAVENOUS

## 2021-02-16 MED ORDER — FENTANYL CITRATE (PF) 250 MCG/5ML IJ SOLN
INTRAMUSCULAR | Status: AC
  Filled 2021-02-16: qty 5

## 2021-02-16 MED ORDER — LACTATED RINGERS IV SOLN: INTRAVENOUS | Status: DC

## 2021-02-16 MED ORDER — PHENYLEPHRINE 40 MCG/ML (10ML) SYRINGE FOR IV PUSH (FOR BLOOD PRESSURE SUPPORT)
PREFILLED_SYRINGE | INTRAVENOUS | Status: DC | PRN
  Administered 2021-02-16 (×3): 80 ug via INTRAVENOUS

## 2021-02-16 MED ORDER — METOCLOPRAMIDE HCL 5 MG/ML IJ SOLN
INTRAMUSCULAR | Status: DC | PRN
  Administered 2021-02-16: 10 mg via INTRAVENOUS

## 2021-02-16 MED ORDER — METOCLOPRAMIDE HCL 5 MG/ML IJ SOLN
INTRAMUSCULAR | Status: AC
  Filled 2021-02-16: qty 2

## 2021-02-16 MED ORDER — FENTANYL CITRATE (PF) 100 MCG/2ML IJ SOLN
INTRAMUSCULAR | Status: AC
  Filled 2021-02-16: qty 2

## 2021-02-16 MED ORDER — ORAL CARE MOUTH RINSE: 15.0000 mL | Freq: Once | OROMUCOSAL | Status: AC

## 2021-02-16 MED ORDER — SUGAMMADEX SODIUM 200 MG/2ML IV SOLN
INTRAVENOUS | Status: DC | PRN
  Administered 2021-02-16: 400 mg via INTRAVENOUS

## 2021-02-16 MED ORDER — BUPIVACAINE-EPINEPHRINE (PF) 0.25% -1:200000 IJ SOLN
INTRAMUSCULAR | Status: AC
  Filled 2021-02-16: qty 30

## 2021-02-16 MED ORDER — FENTANYL CITRATE (PF) 250 MCG/5ML IJ SOLN
INTRAMUSCULAR | Status: DC | PRN
  Administered 2021-02-16 (×3): 50 ug via INTRAVENOUS

## 2021-02-16 MED ORDER — MIDAZOLAM HCL 2 MG/2ML IJ SOLN
INTRAMUSCULAR | Status: AC
  Filled 2021-02-16: qty 2

## 2021-02-16 MED ORDER — ACETAMINOPHEN 160 MG/5ML PO SOLN
325.0000 mg | ORAL | Status: DC | PRN
Start: 2021-02-16 — End: 2021-02-16

## 2021-02-16 MED ORDER — CHLORHEXIDINE GLUCONATE 0.12 % MT SOLN
15.0000 mL | Freq: Once | OROMUCOSAL | Status: AC
  Administered 2021-02-16: 15 mL via OROMUCOSAL
  Filled 2021-02-16: qty 15

## 2021-02-16 MED ORDER — 0.9 % SODIUM CHLORIDE (POUR BTL) OPTIME
TOPICAL | Status: DC | PRN
  Administered 2021-02-16: 1000 mL

## 2021-02-16 MED ORDER — PHENYLEPHRINE HCL-NACL 10-0.9 MG/250ML-% IV SOLN
INTRAVENOUS | Status: AC
  Filled 2021-02-16: qty 250

## 2021-02-16 MED ORDER — ONDANSETRON HCL 4 MG/2ML IJ SOLN: 4.0000 mg | Freq: Once | INTRAMUSCULAR | Status: DC | PRN

## 2021-02-16 MED ORDER — MIDAZOLAM HCL 5 MG/5ML IJ SOLN
INTRAMUSCULAR | Status: DC | PRN
  Administered 2021-02-16 (×2): 1 mg via INTRAVENOUS

## 2021-02-16 MED ORDER — ENSURE PRE-SURGERY PO LIQD: 296.0000 mL | Freq: Once | ORAL | Status: DC

## 2021-02-16 MED ORDER — OXYCODONE HCL 5 MG PO TABS
5.0000 mg | ORAL_TABLET | Freq: Once | ORAL | Status: AC | PRN
Start: 2021-02-16 — End: 2021-02-16
  Administered 2021-02-16: 5 mg via ORAL

## 2021-02-16 MED ORDER — OXYCODONE HCL 5 MG PO TABS
ORAL_TABLET | ORAL | Status: AC
  Filled 2021-02-16: qty 1

## 2021-02-16 MED ORDER — SUCCINYLCHOLINE CHLORIDE 200 MG/10ML IV SOSY
PREFILLED_SYRINGE | INTRAVENOUS | Status: DC | PRN
  Administered 2021-02-16: 200 mg via INTRAVENOUS

## 2021-02-16 MED ORDER — MEPERIDINE HCL 25 MG/ML IJ SOLN: 6.2500 mg | INTRAMUSCULAR | Status: DC | PRN

## 2021-02-16 MED ORDER — FENTANYL CITRATE (PF) 100 MCG/2ML IJ SOLN
25.0000 ug | INTRAMUSCULAR | Status: DC | PRN
  Administered 2021-02-16: 25 ug via INTRAVENOUS

## 2021-02-16 SURGICAL SUPPLY — 38 items

## 2021-02-16 NOTE — Anesthesia Procedure Notes (Signed)
Procedure Name: Intubation Date/Time: 02/16/2021 12:56 PM Performed by: Hendricks Limes, CRNA Pre-anesthesia Checklist: Patient identified, Emergency Drugs available, Suction available and Patient being monitored Patient Re-evaluated:Patient Re-evaluated prior to induction Oxygen Delivery Method: Circle system utilized Preoxygenation: Pre-oxygenation with 100% oxygen Induction Type: IV induction Ventilation: Mask ventilation without difficulty Laryngoscope Size: Mac and 3 Grade View: Grade II Tube type: Oral Tube size: 7.5 mm Number of attempts: 1 Airway Equipment and Method: Stylet (blanket ramp under patient) Placement Confirmation: ETT inserted through vocal cords under direct vision,  positive ETCO2 and breath sounds checked- equal and bilateral Secured at: 22 cm Tube secured with: Tape Dental Injury: Teeth and Oropharynx as per pre-operative assessment

## 2021-02-16 NOTE — Discharge Instructions (Signed)
[facility] ______[facility] LAPAROSCOPIC SURGERY: POST OP INSTRUCTIONS Always review your discharge instruction sheet given to you by the facility where your surgery was performed. IF YOU HAVE DISABILITY OR FAMILY LEAVE FORMS, YOU MUST BRING THEM TO THE OFFICE FOR PROCESSING.   DO NOT GIVE THEM TO YOUR DOCTOR.  1. A prescription for pain medication may be given to you upon discharge.  Take your pain medication as prescribed, if needed.  If narcotic pain medicine is not needed, then you may take acetaminophen (Tylenol) or ibuprofen (Advil) as needed. 2. Take your usually prescribed medications unless otherwise directed. 3. If you need a refill on your pain medication, please contact your pharmacy.  They will contact our office to request authorization. Prescriptions will not be filled after 5pm or on week-ends. 4. You should follow a light diet the first few days after arrival home, such as soup and crackers, etc.  Be sure to include lots of fluids daily. 5. Most patients will experience some swelling and bruising in the area of the incisions.  Ice packs will help.  Swelling and bruising can take several days to resolve.  6. It is common to experience some constipation if taking pain medication after surgery.  Increasing fluid intake and taking a stool softener (such as Colace) will usually help or prevent this problem from occurring.  A mild laxative (Milk of Magnesia or Miralax) should be taken according to package instructions if there are no bowel movements after 48 hours. 7. Unless discharge instructions indicate otherwise, you may remove your bandages 24-48 hours after surgery, and you may shower at that time.  You may have steri-strips (small skin tapes) in place directly over the incision.  These strips should be left on the skin for 7-10 days.  If your surgeon used skin glue on the incision, you may shower in 24 hours.  The glue will flake off over the next 2-3 weeks.  Any sutures or  staples will be removed at the office during your follow-up visit. 8. ACTIVITIES:  You may resume regular (light) daily activities beginning the next day--such as daily self-care, walking, climbing stairs--gradually increasing activities as tolerated.  You may have sexual intercourse when it is comfortable.  Refrain from any heavy lifting or straining until approved by your doctor. a. You may drive when you are no longer taking prescription pain medication, you can comfortably wear a seatbelt, and you can safely maneuver your car and apply brakes. b. RETURN TO WORK:  __________________________________________________________ 9. You should see your doctor in the office for a follow-up appointment approximately 2-3 weeks after your surgery.  Make sure that you call for this appointment within a day or two after you arrive home to insure a convenient appointment time. 10. OTHER INSTRUCTIONS:OK TO SHOWER STARTING TOMORROW 11. ICE PACK, TYLENOL, AND IBUPROFEN ALSO FOR PAIN 12. NO LIFTING MORE THAN 15 POUNDS FOR 2 WEEKS __________________________________________________________________________________________________________________________ __________________________________________________________________________________________________________________________ WHEN TO CALL YOUR DOCTOR: 1. Fever over 101.0 2. Inability to urinate 3. Continued bleeding from incision. 4. Increased pain, redness, or drainage from the incision. 5. Increasing abdominal pain  The clinic staff is available to answer your questions during regular business hours.  Please don't hesitate to call and ask to speak to one of the nurses for clinical concerns.  If you have a medical emergency, go to the nearest emergency room or call 911.  A surgeon from [facility] is always on call at the hospital. [address] [place],  [place], [place]  [address] ? [address], [place],    [id number] [phone] ?  [phone] ? FAX [phone] Web site: [email or web address]

## 2021-02-16 NOTE — Op Note (Signed)
Procedure(s): LAPAROSCOPIC CHOLECYSTECTOMY Procedure Note  [name] female 70 y.o. 02/16/2021  Procedure(s) and Anesthesia Type:    * LAPAROSCOPIC CHOLECYSTECTOMY - General  Surgeon(s) and Role:    Coralie Keens, MD - Primary    * Caryn Bee, MD - Resident-Assisting   Indications:  70 yo F with h/o chronic cholecystitis and large stone burden. Recently she had a positive HIDA scan and was therefore scheduled for laparoscopic cholecystectomy.      Surgeon: Coralie Keens, MD   Assistants: Caryn Bee, MD  Anesthesia: General endotracheal anesthesia  ASA Class: 3  Procedure Detail LAPAROSCOPIC CHOLECYSTECTOMY  After informed consent was obtained, the patient was brought to the operating room and positioned on the operating room table in the supine position with both arms out.  After an adequate level of general anesthesia had been obtained, the abdomen was prepped and draped in the standard sterile fashion.  A surgical timeout was performed following the standard institutional protocol.  We started with a 7mm incision just superior to the umbilicus. Dissection was carried down to the fascia which was sharply incised under direct visualization. The abdominal cavity was then entered bluntly. A pursestring 0 Vicryl stitch was placed in the fascia and a Hasson port held in place with this stitch. Pneumoperitoneum was then achieved to an insufflation pressure of 15 mmHg.  Diagnostic laparoscopy was performed that did not reveal any concerning intra-abdominal findings.  A 5 mm port was then placed in the epigastric area just to the right of the falciform ligament.  2 additional 5 mm ports were placed along the right costal margin.  We then retracted the gallbladder superiorly.  There were a few omental adhesions to the anterior wall of the gallbladder.  These were taken down and the combination of blunt and sharp dissection.  With then turned our attention to the  gallbladder infundibulum.  The peritoneum along the medial and lateral aspect of the gallbladder was opened with electrocautery.  The cystic duct was easily identified and circumferentially dissected free and a combination of blunt dissection and hook cautery dissection.  The cystic artery was then identified just medial to that and was also circumferentially dissected out creating a window behind the gallbladder.   The gallbladder was then dissected off the cystic plate up to about one third and at this point we achieved a critical view of safety.  There was a stone lodged in the cystic duct. Therefore we made a partial ductotomy and extracted the stone from the cystic duct. After that was accomplished, the cystic duct was then clipped with a 81mm metal clip. However, this barely made it across the entire duct and therefore we added a endo-loop to securely close the cystic duct. The cystic artery was clipped with 2 5 mm clips proximal and 1 distal and also transected between the clips.  We then dissected the gallbladder off the liver bed using hook cautery.  Of note, the gallbladder fundus was severely intrahepatic and therefore a hole was made in the gallbladder. This is when we encountered white bile/hydrops but also several stones escaped the gallbladder. These were removed from the abdominal cavity with the specimen bag and suction.   Once the gallbladder was taken off the liver it was placed in a laparoscopic specimen retrieval bag and removed through the supraumbilical port site.  Hemostasis of the gallbladder bed was confirmed.  The supraumbilical port site was then closed with the pursestring suture. The remaining ports were removed under  direct visualization there was no bleeding from the port site.  0.25% Marcaine with Epi was injected in all incisions.  The skin was then closed with 4-0 Biosyn and Dermabond was applied.  All needle and instrument counts were correct at the end of the case.   The patient was awakened from anesthesia without any complications and transferred to the PACU in stable condition.   Findings: chronic cholecystitis, cholelithiasis with stones lodged in the cystic duct, gallbladder hydrops  Estimated Blood Loss:  Minimal         Drains: None         Total IV Fluids: 1057ml  Blood Given: none          Specimens: Gallbladder         Implants: none        Complications:  * No complications entered in OR log *         Disposition: PACU - hemodynamically stable.         Condition: stable

## 2021-02-16 NOTE — Anesthesia Preprocedure Evaluation (Addendum)
Anesthesia Evaluation  Patient identified by MRN, date of birth, ID band Patient awake    Reviewed: Allergy & Precautions, NPO status , Patient's Chart, lab work & pertinent test results  Airway Mallampati: II  TM Distance: >3 FB Neck ROM: Full    Dental  (+) Teeth Intact, Dental Advisory Given, Caps,    Pulmonary sleep apnea , former smoker,    breath sounds clear to auscultation       Cardiovascular hypertension, Pt. on medications  Rhythm:Regular Rate:Normal     Neuro/Psych  Headaches, PSYCHIATRIC DISORDERS Anxiety Depression    GI/Hepatic GERD  Medicated and Controlled,  Endo/Other  diabetes, Well Controlled, Type 2, Oral Hypoglycemic AgentsHypothyroidism Morbid obesity  Renal/GU      Musculoskeletal  (+) Arthritis , Osteoarthritis,    Abdominal   Peds  Hematology  (+) Blood dyscrasia, anemia ,   Anesthesia Other Findings Pt complains of dry cough w/o fever.  Felt to be post nasal drip  Reproductive/Obstetrics                            Anesthesia Physical  Anesthesia Plan  ASA: III  Anesthesia Plan: General   Post-op Pain Management:    Induction: Intravenous  PONV Risk Score and Plan: 3 and Ondansetron and Diphenhydramine  Airway Management Planned: Oral ETT  Additional Equipment: None  Intra-op Plan:   Post-operative Plan: Extubation in OR  Informed Consent: I have reviewed the patients History and Physical, chart, labs and discussed the procedure including the risks, benefits and alternatives for the proposed anesthesia with the patient or authorized representative who has indicated his/her understanding and acceptance.     Dental advisory given  Plan Discussed with: CRNA, Anesthesiologist and Surgeon  Anesthesia Plan Comments:         Anesthesia Quick Evaluation

## 2021-02-16 NOTE — Transfer of Care (Signed)
Immediate Anesthesia Transfer of Care Note  Patient: [name]  Procedure(s) Performed: LAPAROSCOPIC CHOLECYSTECTOMY (N/A Abdomen)  Patient Location: PACU  Anesthesia Type:General  Level of Consciousness: awake, drowsy and patient cooperative  Airway & Oxygen Therapy: Patient Spontanous Breathing and Patient connected to face mask oxygen  Post-op Assessment: Report given to RN, Post -op Vital signs reviewed and stable, Patient moving all extremities X 4 and Patient able to stick tongue midline  Post vital signs: Reviewed and stable  Last Vitals:  Vitals Value Taken Time  BP 113/49 02/16/21 1426  Temp    Pulse 72 02/16/21 1426  Resp 17 02/16/21 1426  SpO2 91 % 02/16/21 1426  Vitals shown include unvalidated device data.  Last Pain:  Vitals:   02/16/21 1026  TempSrc:   PainSc: 0-No pain      Patients Stated Pain Goal: 4 (34/19/62 2297)  Complications: No complications documented.

## 2021-02-16 NOTE — Interval H&P Note (Signed)
History and Physical Interval Note:no change in H and P  02/16/2021 10:54 AM  [name]  has presented today for surgery, with the diagnosis of Hansen.  The various methods of treatment have been discussed with the patient and family. After consideration of risks, benefits and other options for treatment, the patient has consented to  Procedure(s): LAPAROSCOPIC CHOLECYSTECTOMY (N/A) as a surgical intervention.  The patient's history has been reviewed, patient examined, no change in status, stable for surgery.  I have reviewed the patient's chart and labs.  Questions were answered to the patient's satisfaction.     Coralie Keens

## 2021-02-17 ENCOUNTER — Encounter (HOSPITAL_COMMUNITY): Payer: Self-pay | Admitting: Surgery

## 2021-02-17 LAB — SURGICAL PATHOLOGY

## 2021-02-17 NOTE — Anesthesia Postprocedure Evaluation (Signed)
Anesthesia Post Note  Patient: [name]  Procedure(s) Performed: LAPAROSCOPIC CHOLECYSTECTOMY (N/A Abdomen)     Patient location during evaluation: PACU Anesthesia Type: General Level of consciousness: awake and alert Pain management: pain level controlled Vital Signs Assessment: post-procedure vital signs reviewed and stable Respiratory status: spontaneous breathing, nonlabored ventilation, respiratory function stable and patient connected to nasal cannula oxygen Cardiovascular status: blood pressure returned to baseline and stable Postop Assessment: no apparent nausea or vomiting Anesthetic complications: no   No complications documented.  Last Vitals:  Vitals:   02/16/21 1626 02/16/21 1700  BP: (!) 106/59 115/64  Pulse: 61   Resp: 19 18  Temp: 36.6 C   SpO2: 98% 92%    Last Pain:  Vitals:   02/16/21 1626  TempSrc:   PainSc: 0-No pain                 Belenda Cruise P Yatzil Clippinger

## 2021-03-26 ENCOUNTER — Other Ambulatory Visit: Payer: Self-pay | Admitting: Internal Medicine

## 2021-03-26 ENCOUNTER — Other Ambulatory Visit: Payer: Self-pay | Admitting: Interventional Cardiology

## 2021-03-26 NOTE — Telephone Encounter (Addendum)
Prescription refill request for Xarelto received.   Indication: PE  Last office visit: Bhagat, 01/19/2021 Weight: 131.5 kg  Age: 70 yo  Scr: 1.03, 02/16/2021 CrCl: 108 ml/min   Pt is on the correct dose of Xarelto per dosing criteria, prescription refill sent for Xarelto 20mg  daily.

## 2021-04-12 ENCOUNTER — Emergency Department (INDEPENDENT_AMBULATORY_CARE_PROVIDER_SITE_OTHER)
Admission: RE | Admit: 2021-04-12 | Discharge: 2021-04-12 | Disposition: A | Discharge status: Home / Self Care | Payer: Medicare Other | Source: Ambulatory Visit | Attending: Family Medicine | Admitting: Family Medicine

## 2021-04-12 ENCOUNTER — Other Ambulatory Visit: Payer: Self-pay

## 2021-04-12 ENCOUNTER — Emergency Department (INDEPENDENT_AMBULATORY_CARE_PROVIDER_SITE_OTHER): Payer: Medicare Other

## 2021-04-12 ENCOUNTER — Telehealth: Payer: Medicare Other | Admitting: Nurse Practitioner

## 2021-04-12 VITALS — BP 154/89 | HR 86 | Temp 100.0°F | Resp 24 | Ht 65.0 in | Wt 285.0 lb

## 2021-04-12 DIAGNOSIS — U071 COVID-19: Secondary | ICD-10-CM

## 2021-04-12 DIAGNOSIS — R918 Other nonspecific abnormal finding of lung field: Secondary | ICD-10-CM | POA: Diagnosis not present

## 2021-04-12 DIAGNOSIS — R0602 Shortness of breath: Secondary | ICD-10-CM

## 2021-04-12 DIAGNOSIS — I10 Essential (primary) hypertension: Secondary | ICD-10-CM | POA: Diagnosis not present

## 2021-04-12 DIAGNOSIS — G4700 Insomnia, unspecified: Secondary | ICD-10-CM | POA: Diagnosis not present

## 2021-04-12 DIAGNOSIS — Z9989 Dependence on other enabling machines and devices: Secondary | ICD-10-CM | POA: Diagnosis not present

## 2021-04-12 DIAGNOSIS — Z86711 Personal history of pulmonary embolism: Secondary | ICD-10-CM | POA: Diagnosis not present

## 2021-04-12 DIAGNOSIS — R509 Fever, unspecified: Secondary | ICD-10-CM | POA: Diagnosis not present

## 2021-04-12 DIAGNOSIS — J1282 Pneumonia due to coronavirus disease 2019: Secondary | ICD-10-CM | POA: Diagnosis not present

## 2021-04-12 DIAGNOSIS — G4733 Obstructive sleep apnea (adult) (pediatric): Secondary | ICD-10-CM | POA: Diagnosis not present

## 2021-04-12 DIAGNOSIS — L438 Other lichen planus: Secondary | ICD-10-CM | POA: Diagnosis not present

## 2021-04-12 DIAGNOSIS — R0902 Hypoxemia: Secondary | ICD-10-CM | POA: Diagnosis not present

## 2021-04-12 DIAGNOSIS — Z7901 Long term (current) use of anticoagulants: Secondary | ICD-10-CM | POA: Diagnosis not present

## 2021-04-12 DIAGNOSIS — Z6841 Body Mass Index (BMI) 40.0 and over, adult: Secondary | ICD-10-CM | POA: Diagnosis not present

## 2021-04-12 DIAGNOSIS — I2510 Atherosclerotic heart disease of native coronary artery without angina pectoris: Secondary | ICD-10-CM | POA: Diagnosis not present

## 2021-04-12 DIAGNOSIS — R0600 Dyspnea, unspecified: Secondary | ICD-10-CM | POA: Diagnosis not present

## 2021-04-12 DIAGNOSIS — E039 Hypothyroidism, unspecified: Secondary | ICD-10-CM | POA: Diagnosis not present

## 2021-04-12 DIAGNOSIS — J9601 Acute respiratory failure with hypoxia: Secondary | ICD-10-CM | POA: Diagnosis not present

## 2021-04-12 DIAGNOSIS — J45909 Unspecified asthma, uncomplicated: Secondary | ICD-10-CM | POA: Diagnosis not present

## 2021-04-12 DIAGNOSIS — Z87891 Personal history of nicotine dependence: Secondary | ICD-10-CM | POA: Diagnosis not present

## 2021-04-12 DIAGNOSIS — F419 Anxiety disorder, unspecified: Secondary | ICD-10-CM | POA: Diagnosis not present

## 2021-04-12 DIAGNOSIS — K219 Gastro-esophageal reflux disease without esophagitis: Secondary | ICD-10-CM | POA: Diagnosis not present

## 2021-04-12 DIAGNOSIS — J208 Acute bronchitis due to other specified organisms: Secondary | ICD-10-CM

## 2021-04-12 DIAGNOSIS — F32A Depression, unspecified: Secondary | ICD-10-CM | POA: Diagnosis not present

## 2021-04-12 LAB — POC SARS CORONAVIRUS 2 AG -  ED: SARS Coronavirus 2 Ag: POSITIVE — AB

## 2021-04-12 MED ORDER — ACETAMINOPHEN 325 MG PO TABS
650.0000 mg | ORAL_TABLET | ORAL | Status: AC
  Administered 2021-04-12: 650 mg via ORAL

## 2021-04-12 NOTE — Progress Notes (Signed)
[name],   Since your COVID-19 symptoms started less than 5 days ago you may need a prescription for FDA-approved treatments (pills or IV therapy), which we cannot prescribe through an e-Visit. The best way for our providers to make a decision about which COVID treatment is right for you is through a Virtual Urgent Care Visit.  If you would like to discuss COVID therapy options with a provider, cancel this e-Visit and access a Virtual Urgent Care Visit from our [facility] menu.   You will not be charged for the e-Visit.     Thank you   Apolonio Schneiders FNP-C

## 2021-04-12 NOTE — ED Notes (Signed)
Patient is being discharged from the Urgent Care and sent to the Emergency Department via [facility] per , patient is in need of higher level of care due to low O2 sats, comorbidities, & COVID positive. Patient is aware and verbalizes understanding of plan of care.  Vitals:   04/12/21 1326 04/12/21 1520  BP: (!) 154/89   Pulse: 90 86  Resp: (!) 24   Temp: 100 F (37.8 C)   SpO2: (!) 89% 90%  Pt going to [facility] ED - unable to cross [employer] per EMS. RN offered to contact family for patient - pt stated she had already texted her sister to update her

## 2021-04-12 NOTE — ED Triage Notes (Addendum)
Tested positive for COVID at 0300 Symptoms started yesterday  Fever, body aches, SOB, headache  No food today  O2 sats 88-90% in triage  Pt drove self to [place] alone

## 2021-04-12 NOTE — ED Provider Notes (Signed)
[name] [facility]    CSN: [id number] Arrival date & time: 04/12/21  1314      History   Chief Complaint Chief Complaint  Patient presents with  . Covid Positive  . Shortness of Breath    HPI Ludell Zacarias is a 70 y.o. female.   Patient developed a mild sore throat, nasal congestion, and fatigue yesterday morning followed by chills, myalgias, and headache. She has gradually developed a cough and nausea without vomiting.  At 3am today she had a positive home COVID19 test.  She has asthma and complains of increased chest tightness, without pleuritic pain, improved with her albuterol inhaler. Review of records reveals multiple medical problems including history of PE (on Xarelto), essential hypertension, coronary artery disease/unstable angina, morbid obesity, type 2 diabetes, hypthyroid, and OSA. Patient admits that she lives alone.  The history is provided by the patient.    Past Medical History:  Diagnosis Date  . Anemia    none recently  . Anxiety   . Arthritis   . Asthma   . Complete tear of right rotator cuff 12/05/2014  . Coronary artery disease   . Depression   . Diabetes mellitus    Last A1C was 6.2 on 09/18/2020  . Diverticulitis   . Diverticulosis   . Family history of adverse reaction to anesthesia    mom  nausea and vomiting  . GERD (gastroesophageal reflux disease)   . History of kidney stones    seen on scan, has not been a problem.  . Hypertension   . Hypothyroidism   . Migraine   . Personal history of kidney stones   . Pneumonia    hx  . Pulmonary embolism ([place])    a. diagnosed in 01/2017 w/ imaging showing multiple right lung PE with right heart strain. Started on Xarelto.   . Shortness of breath dyspnea    exersion  . Thyroid disease    hypo  . Wears glasses     Patient Active Problem List   Diagnosis Date Noted  . Other secondary pulmonary hypertension ([place]) 04/22/2019  . Chest pain 01/14/2019  . Leukocytosis 01/14/2019   . Vomiting 01/14/2019  . Unstable angina ([place]) 01/14/2019  . OSA (obstructive sleep apnea) 11/27/2018  . Pulmonary embolism with acute cor pulmonale ([place]) 01/19/2017  . Acute saddle pulmonary embolism with acute cor pulmonale ([facility]) 01/19/2017  . Preoperative cardiovascular examination 08/04/2016  . Abnormal EKG 08/04/2016  . Complete tear of right rotator cuff 12/05/2014  . Wound healing, delayed 05/06/2013  . Candidal skin infection 05/06/2013  . H/O diverticulitis of SIGMOID and left colon s/p laparoscopic assisted left colectomy 04/22/13 02/15/2013  . Diverticulitis of proximal descending colon 02/07/2013  . Hypokalemia 01/26/2013  . Type 2 diabetes mellitus ([place]) 08/13/2012  . Morbid obesity ([place]) 08/13/2012  . Insomnia 08/13/2012  . History of ulcerative colitis 08/13/2012  . Musculoskeletal pain 08/13/2012  . Constipation 02/07/2012  . Hyperlipidemia due to dietary fat intake 02/07/2012  . History of migraine headaches 02/07/2012  . Lichen planus 29/92/4268  . Allergic rhinitis 02/07/2012  . Angioedema 02/07/2012  . GERD 02/26/2010  . VITAMIN B12 DEFICIENCY 05/05/2008  . Hypothyroidism 04/29/2008  . Depression 04/29/2008  . Essential hypertension 04/29/2008  . ASTHMA 04/29/2008  . DIVERTICULOSIS, COLON 12/11/2006  . H/O: hysterectomy 02/15/1998    Past Surgical History:  Procedure Laterality Date  . ABDOMINAL HYSTERECTOMY    . CATARACT EXTRACTION, BILATERAL    . CESAREAN SECTION    .  CHOLECYSTECTOMY N/A 02/16/2021   Procedure: LAPAROSCOPIC CHOLECYSTECTOMY;  Surgeon: Coralie Keens, MD;  Location: [facility];  Service: General;  Laterality: N/A;  . COLONOSCOPY     multiple   . ESOPHAGOGASTRODUODENOSCOPY ENDOSCOPY     multiple  . INTRAVASCULAR PRESSURE WIRE/FFR STUDY N/A 01/14/2019   Procedure: INTRAVASCULAR PRESSURE WIRE/FFR STUDY;  Surgeon: Belva Crome, MD;  Location: [facility];  Service: Cardiovascular;  Laterality: N/A;  . KNEE CARTILAGE SURGERY     Left   . LAPAROSCOPIC PARTIAL COLECTOMY N/A 04/22/2013   Procedure: LAPAROSCOPIC PARTIAL COLECTOMY;  Surgeon: Odis Hollingshead, MD;  Location: [facility];  Service: General;  Laterality: N/A;  . LEFT HEART CATH AND CORONARY ANGIOGRAPHY N/A 01/14/2019   Procedure: LEFT HEART CATH AND CORONARY ANGIOGRAPHY;  Surgeon: Belva Crome, MD;  Location: [facility];  Service: Cardiovascular;  Laterality: N/A;  . right foot surgery  little toe and next toe   x 3  . SHOULDER ARTHROSCOPY WITH ROTATOR CUFF REPAIR AND SUBACROMIAL DECOMPRESSION Right 12/05/2014   Procedure: RIGHT SHOULDER ARTHROSCOPY,ACROMOPLASTY, ROTATOR CUFF REPAIR;  Surgeon: [name], MD;  Location: [facility];  Service: Orthopedics;  Laterality: Right;    OB History   No obstetric history on file.      Home Medications    Prior to Admission medications   Medication Sig Start Date End Date Taking? Authorizing Provider  cetirizine (ZYRTEC) 10 MG tablet Take 10 mg by mouth at bedtime.   Yes [provider]  Cholecalciferol (VITAMIN D3) 5000 units CAPS Take 5,000 Units by mouth daily.   Yes [provider]  escitalopram (LEXAPRO) 10 MG tablet TAKE 1 TABLET (10 MG TOTAL) DAILY BY MOUTH. 11/10/20  Yes Baxley, Cresenciano Lick, MD  esomeprazole (NEXIUM) 40 MG capsule Take 40 mg by mouth daily at 12 noon.   Yes [provider]  famotidine (PEPCID) 10 MG tablet Take 10 mg by mouth at bedtime.   Yes [provider]  traZODone (DESYREL) 150 MG tablet TAKE 1 TABLET (150 MG TOTAL) BY MOUTH AT BEDTIME. 11/10/20  Yes Baxley, Cresenciano Lick, MD  XARELTO 20 MG TABS tablet TAKE 1 TABLET (20 MG TOTAL) BY MOUTH DAILY WITH SUPPER. 03/26/21  Yes Belva Crome, MD  acetaminophen (TYLENOL) 500 MG tablet Take 500 mg by mouth every 6 (six) hours as needed.    [provider]  ALPRAZolam (XANAX) 0.5 MG tablet TAKE 1 TABLET BY MOUTH TWICE A DAY AS NEEDED Patient taking differently: Take 0.5 mg by mouth 2 (two) times  daily as needed for anxiety. 09/28/20   Elby Showers, MD  atorvastatin (LIPITOR) 80 MG tablet TAKE 1 TABLET BY MOUTH EVERY DAY AT 6:00 PM Patient taking differently: Take 80 mg by mouth daily. 07/17/20   Burtis Junes, NP  clobetasol ointment (TEMOVATE) 3.23 % Apply 1 application topically 2 (two) times daily as needed (skin irritation).    [provider]  enoxaparin (LOVENOX) 120 MG/0.8ML injection Inject 0.8 mLs (120 mg total) into the skin every 12 (twelve) hours. Patient not taking: Reported on 04/12/2021 02/02/21   Belva Crome, MD  EPINEPHrine 0.3 mg/0.3 mL IJ SOAJ injection Inject 0.3 mLs (0.3 mg total) into the muscle as needed for anaphylaxis. 12/06/19   Elby Showers, MD  furosemide (LASIX) 20 MG tablet TAKE 1 TABLET BY MOUTH EVERY DAY Patient taking differently: 40 mg. 11/10/20   Elby Showers, MD  levothyroxine (SYNTHROID) 200 MCG tablet  TAKE 1 TABLET BY MOUTH EVERY DAY ON EMPTY STOMACH BEFORE BREAKFAST Patient taking differently: Take 200 mcg by mouth daily before breakfast. 04/25/20   Elby Showers, MD  magnesium oxide (MAG-OX) 400 MG tablet Take 1 tablet by mouth on Tuesdays, Thursdays, Saturdays, & Sundays.  Take 1 tablet by mouth twice a day on Mondays, Wednesdays, & Fridays Patient taking differently: Take 400 mg by mouth See admin instructions. Take 400 mg by mouth once daily on Tuesdays, Thursdays, Saturdays, & Sundays and take 400 mg twice a day on Mondays, Wednesdays, & Fridays 03/19/20   Isaiah Serge, NP  metoprolol tartrate (LOPRESSOR) 25 MG tablet TAKE 1 TABLET BY MOUTH EVERY DAY IN THE MORNING Patient taking differently: Take 25 mg by mouth in the morning. 11/10/20   Elby Showers, MD  montelukast (SINGULAIR) 10 MG tablet TAKE 1 TABLET BY MOUTH EVERYDAY AT BEDTIME 03/26/21   Elby Showers, MD  Multiple Vitamins-Minerals (PRESERVISION AREDS 2 PO) Take 1 capsule by mouth daily.    [provider]  nitroGLYCERIN (NITROSTAT) 0.4 MG SL tablet Place 1  tablet (0.4 mg total) under the tongue every 5 (five) minutes as needed for chest pain. 01/15/19 03/18/20  Sheikh, Omair Latif, DO  olmesartan (BENICAR) 40 MG tablet TAKE 1 TABLET BY MOUTH EVERY DAY Patient taking differently: Take 40 mg by mouth daily. 09/22/20   Elby Showers, MD  oxyCODONE (OXY IR/ROXICODONE) 5 MG immediate release tablet Take 1 tablet (5 mg total) by mouth every 6 (six) hours as needed for moderate pain, severe pain or breakthrough pain. Patient not taking: Reported on 04/12/2021 02/16/21   Coralie Keens, MD  VENTOLIN HFA [phone] Base) MCG/ACT inhaler USE 2 PUFFS EVERY 6 HOURS AS NEEDED FOR WHEEZING Patient taking differently: Inhale 2 puffs into the lungs every 6 (six) hours as needed for wheezing. 07/05/18   Elby Showers, MD  zolpidem (AMBIEN) 10 MG tablet TAKE 1 TABLET BY MOUTH AT BEDTIME AS NEEDED. Patient taking differently: Take 10 mg by mouth at bedtime. 12/08/20   Elby Showers, MD    Family History Family History  Problem Relation Age of Onset  . Heart disease Mother   . Kidney failure Father   . Diabetes Father   . Breast cancer Paternal Aunt   . Colon cancer Neg Hx     Social History Social History   Tobacco Use  . Smoking status: Former Smoker    Packs/day: 0.50    Years: 6.00    Pack years: 3.00    Types: Cigarettes    Quit date: 11/14/1982    Years since quitting: 38.4  . Smokeless tobacco: Never Used  . Tobacco comment: quit smoking 30 years ago  Vaping Use  . Vaping Use: Never used  Substance Use Topics  . Alcohol use: No  . Drug use: No     Allergies   Aspirin, Lisinopril, Bee venom, and Morphine and related   Review of Systems Review of Systems  + sore throat + cough No pleuritic pain ? wheezing + nasal congestion ? post-nasal drainage No sinus pain/pressure No itchy/red eyes No earache No hemoptysis + SOB + fever, + chills + nausea No vomiting No abdominal pain No diarrhea No urinary symptoms No skin rash +  fatigue + myalgias + headache    Physical Exam Triage Vital Signs ED Triage Vitals  Enc Vitals Group     BP 04/12/21 1326 (!) 154/89     Pulse Rate  04/12/21 1326 90     Resp 04/12/21 1326 (!) 24     Temp 04/12/21 1326 100 F (37.8 C)     Temp Source 04/12/21 1326 Oral     SpO2 04/12/21 1326 (!) 89 %     Weight 04/12/21 1328 285 lb (129.3 kg)     Height 04/12/21 1328 5\' 5"  (1.651 m)     Head Circumference --      Peak Flow --      Pain Score 04/12/21 1328 6     Pain Loc --      Pain Edu? --      Excl. in [place]? --    No data found.  Updated Vital Signs BP (!) 154/89 (BP Location: Right Arm)   Pulse 90   Temp 100 F (37.8 C) (Oral)   Resp (!) 24   Ht 5\' 5"  (1.651 m)   Wt 129.3 kg   SpO2 (!) 89%   BMI 47.43 kg/m   Visual Acuity Right Eye Distance:   Left Eye Distance:   Bilateral Distance:    Right Eye Near:   Left Eye Near:    Bilateral Near:     Physical Exam Nursing notes and Vital Signs reviewed. Appearance:  Patient is morbidly obese.  She appears stated age, and in no acute distress.  She is alert and oriented.  Eyes:  Pupils are equal, round, and reactive to light and accomodation.  Extraocular movement is intact.  Conjunctivae are not inflamed  Ears:  Normal externally. Nose:  Mildly congested turbinates.  Pharynx:  moist mucous membranes  Neck:  Supple.  Mildly enlarged lateral nodes are present, tender to palpation on the left.   Lungs:  Clear to auscultation.  Breath sounds are equal.   Moving air well. Heart:  Regular rate and rhythm without murmurs, rubs, or gallops.  Abdomen:  Obese, nontender without masses or hepatosplenomegaly.  Bowel sounds are present.   Extremities:  Trace edema.  Skin:  No rash present.   UC Treatments / Results  Labs (all labs ordered are listed, but only abnormal results are displayed) Labs Reviewed  POC SARS CORONAVIRUS 2 AG -  ED - Abnormal; Notable for the following components:      Result Value   SARS  Coronavirus 2 Ag Positive (*)    All other components within normal limits    EKG   Radiology DG Chest 2 View  Result Date: 04/12/2021 CLINICAL DATA:  70 year old female with history of shortness of breath. COVID positive patient. EXAM: CHEST - 2 VIEW COMPARISON:  Chest x-ray 01/13/2019. FINDINGS: Mild diffuse interstitial prominence and peribronchial cuffing. Lung volumes are normal. No consolidative airspace disease. No pleural effusions. No pneumothorax. No pulmonary nodule or mass noted. Pulmonary vasculature and the cardiomediastinal silhouette are within normal limits. IMPRESSION: 1. The appearance the chest suggests an acute bronchitis. Electronically Signed   By: [name] M.D.   On: 04/12/2021 14:25    Procedures Procedures (including critical care time)  Medications Ordered in UC Medications  acetaminophen (TYLENOL) tablet 650 mg (650 mg Oral Given 04/12/21 1345)    Initial Impression / Assessment and Plan / UC Course  I have reviewed the triage vital signs and the nursing notes.  Pertinent labs & imaging results that were available during my care of the patient were reviewed by me and considered in my medical decision making (see chart for details).    Patient has multiple morbidities, including history of PE on  Xarelto, and is at significant risk with COVID19 infection. Vital signs stable.  Will transport to ED by EMS for evaluation, possible admission.   Final Clinical Impressions(s) / UC Diagnoses   Final diagnoses:  Acute bronchitis due to COVID-19 virus   Discharge Instructions   None    ED Prescriptions    None        Kandra Nicolas, MD 04/13/21 1144

## 2021-04-13 ENCOUNTER — Telehealth: Payer: Self-pay

## 2021-04-13 NOTE — Telephone Encounter (Signed)
Called to discuss with patient about COVID-19 symptoms and the use of one of the available treatments for those with mild to moderate Covid symptoms and at a high risk of hospitalization.  Pt appears to qualify for outpatient treatment due to co-morbid conditions and/or a member of an at-risk group in accordance with the FDA Emergency Use Authorization.    Symptom onset: Bronchitis  Date - ? Vaccinated: Yes Booster? Yes Immunocompromised? No Qualifiers: DM,HTN NIH Criteria: Tier 3  Unable to reach pt - Left message and call back number [phone].   [name]

## 2021-04-18 DIAGNOSIS — J9601 Acute respiratory failure with hypoxia: Secondary | ICD-10-CM | POA: Diagnosis not present

## 2021-04-18 DIAGNOSIS — U071 COVID-19: Secondary | ICD-10-CM | POA: Diagnosis not present

## 2021-04-18 DIAGNOSIS — I440 Atrioventricular block, first degree: Secondary | ICD-10-CM | POA: Diagnosis not present

## 2021-04-18 DIAGNOSIS — G4733 Obstructive sleep apnea (adult) (pediatric): Secondary | ICD-10-CM | POA: Diagnosis not present

## 2021-04-18 DIAGNOSIS — Z86711 Personal history of pulmonary embolism: Secondary | ICD-10-CM | POA: Diagnosis not present

## 2021-04-18 DIAGNOSIS — Z7901 Long term (current) use of anticoagulants: Secondary | ICD-10-CM | POA: Diagnosis not present

## 2021-04-18 DIAGNOSIS — Z9989 Dependence on other enabling machines and devices: Secondary | ICD-10-CM | POA: Diagnosis not present

## 2021-04-18 DIAGNOSIS — I10 Essential (primary) hypertension: Secondary | ICD-10-CM | POA: Diagnosis not present

## 2021-04-18 DIAGNOSIS — E039 Hypothyroidism, unspecified: Secondary | ICD-10-CM | POA: Diagnosis not present

## 2021-04-19 ENCOUNTER — Telehealth: Payer: Self-pay | Admitting: Internal Medicine

## 2021-04-19 NOTE — Telephone Encounter (Signed)
[name] was D/C from [facility] on 04/16/2021

## 2021-04-19 NOTE — Telephone Encounter (Signed)
Transition Care Management Follow-up Telephone Call  Date of discharge and from where: 04/16/21 [facility]  How have you been since you were released from the hospital? She is feeling better.   Any questions or concerns? No  Items Reviewed:  Did the pt receive and understand the discharge instructions provided? Yes , decadron 6MG  once a day for 10 days. She did 5 doses of remdesivir.   Medications obtained and verified? Yes   Other? Yes   Any new allergies since your discharge? No   Dietary orders reviewed? Yes  Do you have support at home? Yes   Home Care and Equipment/Supplies: Were home health services ordered? Yes, nurse went to see her yesterday 04/18/21.  If so, what is the name of the agency? Hospitalist at [facility] up a time to come to the patient's home? yes Were any new equipment or medical supplies ordered?  No What is the name of the medical supply agency? N/A Were you able to get the supplies/equipment? not applicable Do you have any questions related to the use of the equipment or supplies? No  Functional Questionnaire: (I = Independent and D = Dependent) ADLs: I   Bathing/Dressing- I  Meal Prep- I  Eating- I  Maintaining continence- I  Transferring/Ambulation- I  Managing Meds- I  Follow up appointments reviewed:   PCP Hospital f/u appt confirmed? Yes  Scheduled to see Dr. Renold Genta on 10/27/21 @ 12:00pm.  [facility] f/u appt confirmed? No  Scheduled to see.  Are transportation arrangements needed? No   If their condition worsens, is the pt aware to call PCP or go to the Emergency Dept.? Yes  Was the patient provided with contact information for the PCP's office or ED? Yes  Was to pt encouraged to call back with questions or concerns? Yes

## 2021-04-27 ENCOUNTER — Other Ambulatory Visit: Payer: Self-pay

## 2021-04-27 ENCOUNTER — Encounter: Payer: Self-pay | Admitting: Internal Medicine

## 2021-04-27 ENCOUNTER — Ambulatory Visit (INDEPENDENT_AMBULATORY_CARE_PROVIDER_SITE_OTHER): Payer: Medicare Other | Admitting: Internal Medicine

## 2021-04-27 VITALS — BP 100/70 | HR 82 | Temp 98.1°F | Ht 65.0 in | Wt 268.0 lb

## 2021-04-27 DIAGNOSIS — E119 Type 2 diabetes mellitus without complications: Secondary | ICD-10-CM

## 2021-04-27 DIAGNOSIS — Z86711 Personal history of pulmonary embolism: Secondary | ICD-10-CM

## 2021-04-27 DIAGNOSIS — R5383 Other fatigue: Secondary | ICD-10-CM

## 2021-04-27 DIAGNOSIS — G4733 Obstructive sleep apnea (adult) (pediatric): Secondary | ICD-10-CM | POA: Diagnosis not present

## 2021-04-27 DIAGNOSIS — U071 COVID-19: Secondary | ICD-10-CM | POA: Diagnosis not present

## 2021-04-27 DIAGNOSIS — I10 Essential (primary) hypertension: Secondary | ICD-10-CM

## 2021-04-27 DIAGNOSIS — R0602 Shortness of breath: Secondary | ICD-10-CM

## 2021-04-27 NOTE — Progress Notes (Signed)
   Subjective:    Patient ID: [name], female    DOB: [birth year], 70 y.o.   MRN: [id number]  HPI 70 year old Female seen for recent hospitalization follow-up.  In April.  Had cholecystectomy for symptomatic gallstones by Dr. Ninfa Linden.  On May 29, patient noted mild sore throat, nasal congestion and fatigue as well as chills, myalgias and headache.  Gradually developed a cough and nausea without vomiting.  Subsequently took time COVID test and it was positive.  Had chest tightness which improved with albuterol inhaler.  She is closely followed her vital signs and noted BP fluctuating from 90/50 to 126/74 and pulse ranging from 46 to 67  Went to Urgent Care in [place].  Pulse oximetry on room air was 89% and temp was 100 degrees.  She has history of saddle embolus with cor pulmonale in the remote past.  History of sleep apnea, GE reflux, hypertension, morbid obesity, depression and hypothyroidism.  She was admitted to [facility] on May 30 with acute COVID-19 virus infection complicated by pneumonia.  Has had 3 COVID-19 vaccines.  Was treated with IV remdesivir in the hospital x4 doses.  She was discharged home on June 3.  She is now here for follow-up.  Currently pulse ox 94- 96% one room air at home. Now on Lasix 40 mg daily.           Review of Systems-has some generalized weakness but feels much better than she did when hospitalized.     Objective:   Physical Exam Weight 268 pounds. Was 310 pounds July 2021. Pulse ox 96% on room air pulse 82 and regular BMI 44.60.  Temperature 98.1 degrees Skin warm and dry: Nodes none.  Neck supple.  Chest clear to auscultation.  Trace lower extremity edema.  Affect thought and judgment are normal.      Assessment & Plan:  Status posttreatment with remdesivir for acute COVID-19 virus infection complicated by pneumonia  Morbid obesity  Hypothyroidism  Status postcholecystectomy  Essential  hypertension  GE reflux treated with PPI  History of asthma  History of depression  History of chronic Xarelto therapy after acute saddle pulmonary embolism with acute cor pulmonale March 2018  Osteoarthritis of both knees  Plan: She will continue with chronic anticoagulation therapy with history of pulmonary embolus in the remote past.  Has not changed medications for hypertension which include Lasix, Benicar and metoprolol.  Continue Lexapro and trazodone for depression.  Continue Xanax for anxiety.  Continue Lipitor for hyperlipidemia and thyroid replacement medication for hypothyroidism.  Continue to monitor pulse oximetry.  Continue to use Ventolin inhaler as needed.  History of chronic insomnia treated with Ambien.  Follow-up and early November for Medicare wellness visit.

## 2021-04-28 LAB — CBC WITH DIFFERENTIAL/PLATELET
Absolute Monocytes: 1242 cells/uL — ABNORMAL HIGH (ref 200–950)
Basophils Absolute: 51 cells/uL (ref 0–200)
Basophils Relative: 0.4 %
Eosinophils Absolute: 269 cells/uL (ref 15–500)
Eosinophils Relative: 2.1 %
HCT: 40.4 % (ref 35.0–45.0)
Hemoglobin: 12.8 g/dL (ref 11.7–15.5)
Lymphs Abs: 3878 cells/uL (ref 850–3900)
MCH: 26.9 pg — ABNORMAL LOW (ref 27.0–33.0)
MCHC: 31.7 g/dL — ABNORMAL LOW (ref 32.0–36.0)
MCV: 84.9 fL (ref 80.0–100.0)
MPV: 12.3 fL (ref 7.5–12.5)
Monocytes Relative: 9.7 %
Neutro Abs: 7360 cells/uL (ref 1500–7800)
Neutrophils Relative %: 57.5 %
Platelets: 293 10*3/uL (ref 140–400)
RBC: 4.76 10*6/uL (ref 3.80–5.10)
RDW: 14.2 % (ref 11.0–15.0)
Total Lymphocyte: 30.3 %
WBC: 12.8 10*3/uL — ABNORMAL HIGH (ref 3.8–10.8)

## 2021-04-28 LAB — TSH: TSH: 2.39 mIU/L (ref 0.40–4.50)

## 2021-04-28 LAB — COMPLETE METABOLIC PANEL WITH GFR
AG Ratio: 1.3 (calc) (ref 1.0–2.5)
ALT: 17 U/L (ref 6–29)
AST: 12 U/L (ref 10–35)
Albumin: 3.4 g/dL — ABNORMAL LOW (ref 3.6–5.1)
Alkaline phosphatase (APISO): 57 U/L (ref 37–153)
BUN/Creatinine Ratio: 22 (calc) (ref 6–22)
BUN: 25 mg/dL (ref 7–25)
CO2: 33 mmol/L — ABNORMAL HIGH (ref 20–32)
Calcium: 9.4 mg/dL (ref 8.6–10.4)
Chloride: 101 mmol/L (ref 98–110)
Creat: 1.16 mg/dL — ABNORMAL HIGH (ref 0.50–0.99)
GFR, Est African American: 56 mL/min/{1.73_m2} — ABNORMAL LOW (ref >=60)
GFR, Est Non African American: 48 mL/min/{1.73_m2} — ABNORMAL LOW (ref >=60)
Globulin: 2.7 g/dL (calc) (ref 1.9–3.7)
Glucose, Bld: 104 mg/dL — ABNORMAL HIGH (ref 65–99)
Potassium: 4.3 mmol/L (ref 3.5–5.3)
Sodium: 142 mmol/L (ref 135–146)
Total Bilirubin: 0.5 mg/dL (ref 0.2–1.2)
Total Protein: 6.1 g/dL (ref 6.1–8.1)

## 2021-04-28 LAB — HEMOGLOBIN A1C
Hgb A1c MFr Bld: 6.2 % of total Hgb — ABNORMAL HIGH (ref <5.7)
Mean Plasma Glucose: 131 mg/dL
eAG (mmol/L): 7.3 mmol/L

## 2021-04-28 LAB — BRAIN NATRIURETIC PEPTIDE: Brain Natriuretic Peptide: 44 pg/mL (ref <100)

## 2021-04-28 LAB — MAGNESIUM: Magnesium: 1.9 mg/dL (ref 1.5–2.5)

## 2021-05-05 NOTE — Progress Notes (Signed)
Scheduled

## 2021-05-25 ENCOUNTER — Other Ambulatory Visit: Payer: Self-pay

## 2021-05-25 ENCOUNTER — Other Ambulatory Visit: Payer: Medicare Other | Admitting: Internal Medicine

## 2021-05-25 DIAGNOSIS — D72829 Elevated white blood cell count, unspecified: Secondary | ICD-10-CM

## 2021-05-25 LAB — CBC WITH DIFFERENTIAL/PLATELET
Absolute Monocytes: 996 cells/uL — ABNORMAL HIGH (ref 200–950)
Basophils Absolute: 84 cells/uL (ref 0–200)
Basophils Relative: 0.7 %
Eosinophils Absolute: 180 cells/uL (ref 15–500)
Eosinophils Relative: 1.5 %
HCT: 39.3 % (ref 35.0–45.0)
Hemoglobin: 12 g/dL (ref 11.7–15.5)
Lymphs Abs: 3816 cells/uL (ref 850–3900)
MCH: 26.2 pg — ABNORMAL LOW (ref 27.0–33.0)
MCHC: 30.5 g/dL — ABNORMAL LOW (ref 32.0–36.0)
MCV: 85.8 fL (ref 80.0–100.0)
MPV: 11.9 fL (ref 7.5–12.5)
Monocytes Relative: 8.3 %
Neutro Abs: 6924 cells/uL (ref 1500–7800)
Neutrophils Relative %: 57.7 %
Platelets: 267 10*3/uL (ref 140–400)
RBC: 4.58 10*6/uL (ref 3.80–5.10)
RDW: 14.6 % (ref 11.0–15.0)
Total Lymphocyte: 31.8 %
WBC: 12 10*3/uL — ABNORMAL HIGH (ref 3.8–10.8)

## 2021-06-02 NOTE — Patient Instructions (Addendum)
We are glad you are feeling better.  Continue to watch pulse oximetry and let us know if not within normal limits.  Continue current medications.  Try to walk some and use inhaler as needed.  Recommend COVID booster in 2 months.

## 2021-06-21 ENCOUNTER — Other Ambulatory Visit: Payer: Self-pay | Admitting: Internal Medicine

## 2021-07-12 ENCOUNTER — Other Ambulatory Visit: Payer: Self-pay

## 2021-07-12 MED ORDER — FUROSEMIDE 40 MG PO TABS: 40.0000 mg | ORAL_TABLET | Freq: Every day | ORAL | 1 refills | Status: DC

## 2021-07-23 ENCOUNTER — Other Ambulatory Visit: Payer: Self-pay | Admitting: *Deleted

## 2021-07-23 ENCOUNTER — Other Ambulatory Visit: Payer: Self-pay | Admitting: Internal Medicine

## 2021-07-23 MED ORDER — ATORVASTATIN CALCIUM 80 MG PO TABS: 80.0000 mg | ORAL_TABLET | Freq: Every day | ORAL | 0 refills | Status: DC

## 2021-07-27 MED ORDER — ALBUTEROL SULFATE HFA 108 (90 BASE) MCG/ACT IN AERS: 2.0000 | INHALATION_SPRAY | Freq: Four times a day (QID) | RESPIRATORY_TRACT | 11 refills | Status: DC | PRN

## 2021-08-06 ENCOUNTER — Telehealth: Payer: Self-pay | Admitting: Internal Medicine

## 2021-08-06 NOTE — Telephone Encounter (Addendum)
Pulse OX today is 94%, COVID test today is negative. She does not feel she needs to go to Urgent Care, if anything changes over weekend she will go to UC. The cough is wet.

## 2021-08-06 NOTE — Telephone Encounter (Signed)
Scheduled for Monday, she will go to UC if need be Over weekend

## 2021-08-06 NOTE — Telephone Encounter (Signed)
[name] [phone]  [name] called to say she has been having cough and congestion for about a month, she did COVID test about 2 weeks ago and it was negative. I ask her do another COVID test and call me back with results.  She is also having swelling in both legs, the left leg more so than the right.

## 2021-08-09 ENCOUNTER — Ambulatory Visit
Admission: RE | Admit: 2021-08-09 | Discharge: 2021-08-09 | Disposition: A | Discharge status: Home / Self Care | Payer: Medicare Other | Source: Ambulatory Visit | Attending: Internal Medicine | Admitting: Internal Medicine

## 2021-08-09 ENCOUNTER — Other Ambulatory Visit: Payer: Self-pay

## 2021-08-09 ENCOUNTER — Ambulatory Visit (INDEPENDENT_AMBULATORY_CARE_PROVIDER_SITE_OTHER): Payer: Medicare Other | Admitting: Internal Medicine

## 2021-08-09 VITALS — BP 126/78 | HR 73 | Temp 98.6°F | Resp 16 | Ht 65.0 in | Wt 281.0 lb

## 2021-08-09 DIAGNOSIS — E785 Hyperlipidemia, unspecified: Secondary | ICD-10-CM

## 2021-08-09 DIAGNOSIS — J3489 Other specified disorders of nose and nasal sinuses: Secondary | ICD-10-CM

## 2021-08-09 DIAGNOSIS — G4733 Obstructive sleep apnea (adult) (pediatric): Secondary | ICD-10-CM

## 2021-08-09 DIAGNOSIS — E1169 Type 2 diabetes mellitus with other specified complication: Secondary | ICD-10-CM | POA: Diagnosis not present

## 2021-08-09 DIAGNOSIS — Z86711 Personal history of pulmonary embolism: Secondary | ICD-10-CM | POA: Diagnosis not present

## 2021-08-09 DIAGNOSIS — I10 Essential (primary) hypertension: Secondary | ICD-10-CM

## 2021-08-09 DIAGNOSIS — I5022 Chronic systolic (congestive) heart failure: Secondary | ICD-10-CM | POA: Diagnosis not present

## 2021-08-09 DIAGNOSIS — R0989 Other specified symptoms and signs involving the circulatory and respiratory systems: Secondary | ICD-10-CM

## 2021-08-09 DIAGNOSIS — I259 Chronic ischemic heart disease, unspecified: Secondary | ICD-10-CM

## 2021-08-09 DIAGNOSIS — H9201 Otalgia, right ear: Secondary | ICD-10-CM

## 2021-08-09 DIAGNOSIS — M7989 Other specified soft tissue disorders: Secondary | ICD-10-CM

## 2021-08-09 DIAGNOSIS — J22 Unspecified acute lower respiratory infection: Secondary | ICD-10-CM | POA: Diagnosis not present

## 2021-08-09 DIAGNOSIS — R5383 Other fatigue: Secondary | ICD-10-CM | POA: Diagnosis not present

## 2021-08-09 DIAGNOSIS — R0602 Shortness of breath: Secondary | ICD-10-CM

## 2021-08-09 DIAGNOSIS — G4730 Sleep apnea, unspecified: Secondary | ICD-10-CM

## 2021-08-09 MED ORDER — METHYLPREDNISOLONE ACETATE 80 MG/ML IJ SUSP
80.0000 mg | Freq: Once | INTRAMUSCULAR | Status: AC
Start: 2021-08-09 — End: 2021-08-09
  Administered 2021-08-09: 80 mg via INTRAMUSCULAR

## 2021-08-09 MED ORDER — DOXYCYCLINE HYCLATE 100 MG PO TABS: 100.0000 mg | ORAL_TABLET | Freq: Two times a day (BID) | ORAL | 0 refills | Status: DC

## 2021-08-09 NOTE — Progress Notes (Signed)
   Subjective:    Patient ID: [name], female    DOB: 10-06-1951, 70 y.o.   MRN: [id number]  HPI 70 year old Female seen today for cough and congestion. Cannot lie flat. Sleeping recently in recliner. Had Covid-19 in June and recovered. No chest pain.  Yesterday pulse ox was 79%  when got up urgently to let dog out.Sometimes as low as 88%  She had laparoscopic cholecystectomy in April 2022.  She had acute COVID-19 in May 2022.  She has a history of asthma, hypertension coronary disease pulmonary embolism and hypothyroidism.  Was admitted in June for acute hypoxic respiratory failure due to COVID-19.  Was treated with remdesivir and dexamethasone.  Review of Systems Sometimes can't get a deep breath. Using an inhalers now regularly. Decreased energy. Tired and SOB after walking short distance.  Taking Lasix 40 mg daily. Took 60 mg daily for a few days and says it did not help.  She is on chronic anticoagulation therapy.     Objective:   Physical Exam BP 126/78 pulse 16 pulse ox 93% BMI 46.76 Skin is warm and dry.  TMs clear.  Pharynx is clear.  Neck is supple.  Chest decreased breath sounds in bases bilaterally.  Cardiac exam: Regular rate and rhythm without ectopy or murmur.  Lower extremities are puffy with 1+ pitting edema.      Assessment & Plan:  She may have occult pneumonia versus bronchitis.  I do not think this is heart failure.  BNP is pending.  Chest x-ray is pending.  Lower extremity edema-new per patient  Coronary artery disease of LAD with stable angina  Pulmonary hypertension (secondary)  Type 2 diabetes mellitus with hyperlipidemia  Morbid obesity  History of pulmonary embolism  Obstructive sleep apnea-uses CPAP  Essential hypertension  History of hypothyroidism  History of GE reflux  Plan: Increase Lasix to 40 mg twice daily x 3 days then back to 40 mg daily. Have CXR today to exclude pneumonia or heart failure.  Take doxycycline 100  mg twice daily for 10 days.  Depo-Medrol 80 mg given IM Draw CBC with diff, C-met, BNP Have prescribed doxycycline 100 mg twice daily for 10 days.  Use albuterol inhaler 4 times a day.  We will notify patient of lab results and chest x-ray results.  Call if symptoms worsen or not improving within a couple of days.

## 2021-08-09 NOTE — Patient Instructions (Addendum)
Have CXR. Labs drawn including BNP and B-met, CBC with diff. Depomedrol 80 mg IM given in office. Take Doxycycline 100 mg twice daily for 10 days for respiratory congestion and right ear pain. Increase Lasix to 40 mg twice daily x 3 days.

## 2021-08-10 ENCOUNTER — Other Ambulatory Visit: Payer: Self-pay | Admitting: Internal Medicine

## 2021-08-10 ENCOUNTER — Telehealth: Payer: Self-pay | Admitting: Internal Medicine

## 2021-08-10 LAB — CBC WITH DIFFERENTIAL/PLATELET
Absolute Monocytes: 806 cells/uL (ref 200–950)
Basophils Absolute: 61 cells/uL (ref 0–200)
Basophils Relative: 0.6 %
Eosinophils Absolute: 500 cells/uL (ref 15–500)
Eosinophils Relative: 4.9 %
HCT: 37.2 % (ref 35.0–45.0)
Hemoglobin: 11.4 g/dL — ABNORMAL LOW (ref 11.7–15.5)
Lymphs Abs: 3111 cells/uL (ref 850–3900)
MCH: 26.8 pg — ABNORMAL LOW (ref 27.0–33.0)
MCHC: 30.6 g/dL — ABNORMAL LOW (ref 32.0–36.0)
MCV: 87.5 fL (ref 80.0–100.0)
MPV: 11.4 fL (ref 7.5–12.5)
Monocytes Relative: 7.9 %
Neutro Abs: 5722 cells/uL (ref 1500–7800)
Neutrophils Relative %: 56.1 %
Platelets: 267 10*3/uL (ref 140–400)
RBC: 4.25 10*6/uL (ref 3.80–5.10)
RDW: 12.6 % (ref 11.0–15.0)
Total Lymphocyte: 30.5 %
WBC: 10.2 10*3/uL (ref 3.8–10.8)

## 2021-08-10 LAB — BRAIN NATRIURETIC PEPTIDE: Brain Natriuretic Peptide: 30 pg/mL (ref <100)

## 2021-08-10 LAB — BASIC METABOLIC PANEL WITH GFR
BUN/Creatinine Ratio: 16 (calc) (ref 6–22)
BUN: 22 mg/dL (ref 7–25)
CO2: 32 mmol/L (ref 20–32)
Calcium: 9.4 mg/dL (ref 8.6–10.4)
Chloride: 102 mmol/L (ref 98–110)
Creat: 1.4 mg/dL — ABNORMAL HIGH (ref 0.50–1.05)
Glucose, Bld: 112 mg/dL — ABNORMAL HIGH (ref 65–99)
Potassium: 4.6 mmol/L (ref 3.5–5.3)
Sodium: 143 mmol/L (ref 135–146)
eGFR: 41 mL/min/{1.73_m2} — ABNORMAL LOW (ref >=60)

## 2021-08-10 NOTE — Telephone Encounter (Signed)
-----   Message from Elby Showers, MD sent at 08/10/2021 10:28 AM EDT ----- Please let her know her CXR is normal ----- Message ----- From: Interface, Rad Results In Sent: 08/10/2021  10:18 AM EDT To: Elby Showers, MD

## 2021-08-10 NOTE — Telephone Encounter (Signed)
Called patient to let her know results of chest xray, she verbalized understanding.

## 2021-08-12 LAB — RESPIRATORY VIRUS PANEL

## 2021-08-19 DIAGNOSIS — Z23 Encounter for immunization: Secondary | ICD-10-CM | POA: Diagnosis not present

## 2021-08-22 NOTE — Progress Notes (Signed)
Cardiology Office Note:    Date:  08/26/2021   ID:  Gesenia, Bantz 09-Jun-1951, MRN [id number]  PCP:  Elby Showers, MD  Cardiologist:  Sinclair Grooms, MD   Referring MD: Elby Showers, MD   Chief Complaint  Patient presents with   Coronary Artery Disease   Shortness of Breath   Congestive Heart Failure    History of Present Illness:    Sukaina Toothaker is a 70 y.o. female with a hx of medically managed CAD, hypertension, diabetes mellitus, obstructive sleep apnea on CPAP, history of pulmonary embolism, moderately severe pulmonary HTN felt secondary to obesity hypoventilation. H/O depression, COVID 19, respiratory failure, laparoscopic cholecystectomy and ?right knee replacement.  She is doing well.  Since last being seen she had COVID-19 and was hospitalized.  Within the past 6 weeks she had pulmonary congestion, cough, and lower extremity swelling.  She was managed by giving doxycycline, dexamethasone, and doubling Lasix for several days by Dr. Renold Genta.  She now states that she feels 100% better.  She denies orthopnea and PND.  She will not wear CPAP.  She denies angina.  She has not had palpitations or racing heart.  Past Medical History:  Diagnosis Date   Anemia    none recently   Anxiety    Arthritis    Asthma    Complete tear of right rotator cuff 12/05/2014   Coronary artery disease    Depression    Diabetes mellitus    Last A1C was 6.2 on 09/18/2020   Diverticulitis    Diverticulosis    Family history of adverse reaction to anesthesia    mom  nausea and vomiting   GERD (gastroesophageal reflux disease)    History of kidney stones    seen on scan, has not been a problem.   Hypertension    Hypothyroidism    Migraine    Personal history of kidney stones    Pneumonia    hx   Pulmonary embolism ([place])    a. diagnosed in 01/2017 w/ imaging showing multiple right lung PE with right heart strain. Started on Xarelto.    Shortness of breath  dyspnea    exersion   Thyroid disease    hypo   Wears glasses     Past Surgical History:  Procedure Laterality Date   ABDOMINAL HYSTERECTOMY     CATARACT EXTRACTION, BILATERAL     CESAREAN SECTION     CHOLECYSTECTOMY N/A 02/16/2021   Procedure: LAPAROSCOPIC CHOLECYSTECTOMY;  Surgeon: Coralie Keens, MD;  Location: [facility];  Service: General;  Laterality: N/A;   COLONOSCOPY     multiple    ESOPHAGOGASTRODUODENOSCOPY ENDOSCOPY     multiple   INTRAVASCULAR PRESSURE WIRE/FFR STUDY N/A 01/14/2019   Procedure: INTRAVASCULAR PRESSURE WIRE/FFR STUDY;  Surgeon: Belva Crome, MD;  Location: [facility];  Service: Cardiovascular;  Laterality: N/A;   KNEE CARTILAGE SURGERY     Left   LAPAROSCOPIC PARTIAL COLECTOMY N/A 04/22/2013   Procedure: LAPAROSCOPIC PARTIAL COLECTOMY;  Surgeon: Odis Hollingshead, MD;  Location: [facility];  Service: General;  Laterality: N/A;   LEFT HEART CATH AND CORONARY ANGIOGRAPHY N/A 01/14/2019   Procedure: LEFT HEART CATH AND CORONARY ANGIOGRAPHY;  Surgeon: Belva Crome, MD;  Location: [facility];  Service: Cardiovascular;  Laterality: N/A;   right foot surgery  little toe and next toe   x 3   SHOULDER ARTHROSCOPY WITH ROTATOR CUFF REPAIR AND SUBACROMIAL DECOMPRESSION Right  12/05/2014   Procedure: RIGHT SHOULDER ARTHROSCOPY,ACROMOPLASTY, ROTATOR CUFF REPAIR;  Surgeon: [name], MD;  Location: [facility];  Service: Orthopedics;  Laterality: Right;    Current Medications: Current Meds  Medication Sig   acetaminophen (TYLENOL) 500 MG tablet Take 500 mg by mouth every 6 (six) hours as needed.   albuterol (VENTOLIN HFA) 108 (90 Base) MCG/ACT inhaler Inhale 2 puffs into the lungs every 6 (six) hours as needed for wheezing or shortness of breath. USE 2 PUFFS EVERY 6 HOURS AS NEEDED FOR WHEEZING   ALPRAZolam (XANAX) 0.5 MG tablet TAKE 1 TABLET BY MOUTH TWICE A DAY AS NEEDED   atorvastatin (LIPITOR) 80 MG tablet Take 1 tablet (80 mg total) by  mouth daily. TAKE 1 TABLET BY MOUTH EVERY DAY AT 6:00 PM   cetirizine (ZYRTEC) 10 MG tablet Take 10 mg by mouth at bedtime.   Cholecalciferol (VITAMIN D3) 5000 units CAPS Take 5,000 Units by mouth daily.   clobetasol ointment (TEMOVATE) 1.51 % Apply 1 application topically 2 (two) times daily as needed (skin irritation).   dextromethorphan-guaiFENesin (ROBITUSSIN-DM) 10-100 MG/5ML liquid Take by mouth as needed.   EPINEPHrine 0.3 mg/0.3 mL IJ SOAJ injection Inject 0.3 mLs (0.3 mg total) into the muscle as needed for anaphylaxis.   escitalopram (LEXAPRO) 10 MG tablet TAKE 1 TABLET (10 MG TOTAL) DAILY BY MOUTH.   famotidine (PEPCID) 10 MG tablet Take 10 mg by mouth at bedtime.   furosemide (LASIX) 40 MG tablet Take 1 tablet (40 mg total) by mouth daily.   levothyroxine (SYNTHROID) 200 MCG tablet TAKE 1 TABLET BY MOUTH EVERY DAY ON EMPTY STOMACH BEFORE BREAKFAST   magnesium oxide (MAG-OX) 400 MG tablet Take 1 tablet by mouth on Tuesdays, Thursdays, Saturdays, & Sundays.  Take 1 tablet by mouth twice a day on Mondays, Wednesdays, & Fridays (Patient taking differently: Take 400 mg by mouth See admin instructions. Take 400 mg by mouth once daily on Tuesdays, Thursdays, Saturdays, & Sundays and take 400 mg twice a day on Mondays, Wednesdays, & Fridays)   metoprolol tartrate (LOPRESSOR) 25 MG tablet TAKE 1 TABLET BY MOUTH EVERY DAY IN THE MORNING   montelukast (SINGULAIR) 10 MG tablet TAKE 1 TABLET BY MOUTH EVERYDAY AT BEDTIME   Multiple Vitamins-Minerals (PRESERVISION AREDS 2 PO) Take 1 capsule by mouth daily.   olmesartan (BENICAR) 40 MG tablet TAKE 1 TABLET BY MOUTH EVERY DAY (Patient taking differently: Take 40 mg by mouth daily.)   traZODone (DESYREL) 150 MG tablet TAKE 1 TABLET BY MOUTH AT BEDTIME.   XARELTO 20 MG TABS tablet TAKE 1 TABLET (20 MG TOTAL) BY MOUTH DAILY WITH SUPPER.   zolpidem (AMBIEN) 10 MG tablet TAKE 1 TABLET BY MOUTH EVERY DAY AT BEDTIME AS NEEDED     Allergies:   Aspirin,  Lisinopril, Bee venom, and Morphine and related   Social History   Socioeconomic History   Marital status: Single    Spouse name: Not on file   Number of children: Not on file   Years of education: Not on file   Highest education level: Not on file  Occupational History   Not on file  Tobacco Use   Smoking status: Former    Packs/day: 0.50    Years: 6.00    Pack years: 3.00    Types: Cigarettes    Quit date: 11/14/1982    Years since quitting: 38.8   Smokeless tobacco: Never   Tobacco comments:    quit smoking 30 years  ago  Vaping Use   Vaping Use: Never used  Substance and Sexual Activity   Alcohol use: No   Drug use: No   Sexual activity: Not on file  Other Topics Concern   Not on file  Social History Narrative   Not on file   Social Determinants of Health   Financial Resource Strain: Not on file  Food Insecurity: Not on file  Transportation Needs: Not on file  Physical Activity: Not on file  Stress: Not on file  Social Connections: Not on file     Family History: The patient's family history includes Breast cancer in her paternal aunt; Diabetes in her father; Heart disease in her mother; Kidney failure in her father. There is no history of Colon cancer.  ROS:   Please see the history of present illness.    Appetite is decreased.  30 pound weight loss since gallbladder surgery and COVID-19.  Says she still eats, does not really get hungry.  Based on today's data, she has lost 40 pounds since last year and 9 pounds since September.  All other systems reviewed and are negative.  EKGs/Labs/Other Studies Reviewed:    The following studies were reviewed today: No new data  The most recent Echocardiogram 2020: IMPRESSIONS     1. The left ventricle has normal systolic function with an ejection  fraction of 60-65%. The cavity size was normal. Left ventricular diastolic  parameters were normal.   2. The right ventricle has mildly reduced systolic function. The  cavity  was moderately enlarged. There is no increase in right ventricular wall  thickness. Right ventricular systolic pressure is severely elevated with  an estimated pressure of 71.7 mmHg.   3. The mitral valve is normal in structure. There is mild mitral annular  calcification present.   4. The tricuspid valve is normal in structure.   5. The aortic valve is tricuspid.   6. The pulmonic valve was normal in structure.   7. The inferior vena cava was dilated in size with >50% respiratory  variability.   8. The interatrial septum appears to be lipomatous.   EKG:  EKG is not repeated.  EKG done in March 2022 is reviewed and demonstrated normal sinus rhythm with first-degree AV block and incomplete right bundle.  Recent Labs: 04/27/2021: ALT 17; Magnesium 1.9; TSH 2.39 08/09/2021: Brain Natriuretic Peptide 30; BUN 22; Creat 1.40; Hemoglobin 11.4; Platelets 267; Potassium 4.6; Sodium 143  Recent Lipid Panel    Component Value Date/Time   CHOL 98 09/18/2020 1109   CHOL 102 05/08/2019 1013   TRIG 106 09/18/2020 1109   HDL 37 (L) 09/18/2020 1109   HDL 43 05/08/2019 1013   CHOLHDL 2.6 09/18/2020 1109   VLDL 39 (H) 05/25/2016 1213   LDLCALC 42 09/18/2020 1109    Physical Exam:    VS:  BP 122/60   Pulse (!) 56   Ht 5\' 5"  (1.651 m)   Wt 272 lb 12.8 oz (123.7 kg)   SpO2 95%   BMI 45.40 kg/m     Wt Readings from Last 3 Encounters:  08/26/21 272 lb 12.8 oz (123.7 kg)  08/09/21 281 lb (127.5 kg)  04/27/21 268 lb (121.6 kg)     GEN: Overweight with BMI 45. No acute distress HEENT: Normal NECK: No JVD. LYMPHATICS: No lymphadenopathy CARDIAC: No murmur. RRR no gallop, or edema. VASCULAR:  Normal Pulses. No bruits. RESPIRATORY:  Clear to auscultation without rales, wheezing or rhonchi  ABDOMEN: Soft, non-tender, non-distended,  No pulsatile mass, MUSCULOSKELETAL: No deformity  SKIN: Warm and dry NEUROLOGIC:  Alert and oriented x 3 PSYCHIATRIC:  Normal affect   ASSESSMENT:     1. Coronary artery disease of native artery of native heart with stable angina pectoris ([place])   2. Essential hypertension   3. Type 2 diabetes mellitus with hyperosmolarity without coma, without long-term current use of insulin ([place])   4. Other secondary pulmonary hypertension ([place])    PLAN:    In order of problems listed above:  No symptoms to suggest angina.  Continue primary prevention. Current blood pressure is excellent.  Continue diuretic, Benicar 40 mg daily, and metoprolol tartrate. She should really be considered for an SGLT2 given diabetes and tendency for fluid retention.  The 2020 echocardiogram did not demonstrate diastolic dysfunction. I encouraged CPAP but she refuses.  Consider adding SGLT2 to help with volume removal and give cardioprotection against heart failure.   Overall education and awareness concerning primary/secondary risk prevention was discussed in detail: LDL less than 70, hemoglobin A1c less than 7, blood pressure target less than 130/80 mmHg, >150 minutes of moderate aerobic activity per week, avoidance of smoking, weight control (via diet and exercise), and continued surveillance/management of/for obstructive sleep apnea.    Medication Adjustments/Labs and Tests Ordered: Current medicines are reviewed at length with the patient today.  Concerns regarding medicines are outlined above.  No orders of the defined types were placed in this encounter.  No orders of the defined types were placed in this encounter.   Patient Instructions  Medication Instructions:  Your physician recommends that you continue on your current medications as directed. Please refer to the Current Medication list given to you today.  *If you need a refill on your cardiac medications before your next appointment, please call your pharmacy*   Lab Work: None If you have labs (blood work) drawn today and your tests are completely normal, you will receive your results only  by: [facility] (if you have MyChart) OR A paper copy in the mail If you have any lab test that is abnormal or we need to change your treatment, we will call you to review the results.   Testing/Procedures: None   Follow-Up: At [facility], you and your health needs are our priority.  As part of our continuing mission to provide you with exceptional heart care, we have created designated Provider Care Teams.  These Care Teams include your primary Cardiologist (physician) and Advanced Practice Providers (APPs -  Physician Assistants and Nurse Practitioners) who all work together to provide you with the care you need, when you need it.  We recommend signing up for the patient portal called "MyChart".  Sign up information is provided on this After Visit Summary.  MyChart is used to connect with patients for Virtual Visits (Telemedicine).  Patients are able to view lab/test results, encounter notes, upcoming appointments, etc.  Non-urgent messages can be sent to your provider as well.   To learn more about what you can do with MyChart, go to [email or web address].    Your next appointment:   1 year(s)  The format for your next appointment:   In Person  Provider:   You may see Sinclair Grooms, MD or one of the following Advanced Practice Providers on your designated Care Team:   Cecilie Kicks, NP   Other Instructions     Signed, Sinclair Grooms, MD  08/26/2021 5:24 PM    [place]

## 2021-08-26 ENCOUNTER — Ambulatory Visit (INDEPENDENT_AMBULATORY_CARE_PROVIDER_SITE_OTHER): Payer: Medicare Other | Admitting: Interventional Cardiology

## 2021-08-26 ENCOUNTER — Other Ambulatory Visit: Payer: Self-pay

## 2021-08-26 ENCOUNTER — Encounter: Payer: Self-pay | Admitting: Interventional Cardiology

## 2021-08-26 VITALS — BP 122/60 | HR 56 | Ht 65.0 in | Wt 272.8 lb

## 2021-08-26 DIAGNOSIS — I10 Essential (primary) hypertension: Secondary | ICD-10-CM | POA: Diagnosis not present

## 2021-08-26 DIAGNOSIS — I25118 Atherosclerotic heart disease of native coronary artery with other forms of angina pectoris: Secondary | ICD-10-CM | POA: Diagnosis not present

## 2021-08-26 DIAGNOSIS — I259 Chronic ischemic heart disease, unspecified: Secondary | ICD-10-CM | POA: Diagnosis not present

## 2021-08-26 DIAGNOSIS — E1100 Type 2 diabetes mellitus with hyperosmolarity without nonketotic hyperglycemic-hyperosmolar coma (NKHHC): Secondary | ICD-10-CM

## 2021-08-26 DIAGNOSIS — I2729 Other secondary pulmonary hypertension: Secondary | ICD-10-CM

## 2021-08-26 NOTE — Patient Instructions (Signed)

## 2021-08-27 ENCOUNTER — Telehealth: Payer: Self-pay

## 2021-08-27 NOTE — Progress Notes (Signed)
Left message for patient to return call to office at [phone].  

## 2021-08-27 NOTE — Telephone Encounter (Signed)
-----   Message from Elby Showers, MD sent at 08/26/2021  5:44 PM EDT ----- Please call her and ask if she would she Endocrine consult about medication Dr. [name] suggested for Diabetes ----- Message ----- From: Belva Crome, MD Sent: 08/26/2021   5:29 PM EDT To: Elby Showers, MD

## 2021-08-27 NOTE — Telephone Encounter (Signed)
Left message for patient to return call to office at [phone].  

## 2021-08-30 NOTE — Telephone Encounter (Signed)
Patient states that she will see endocrine if you think it is needed. She is not opposed to it.

## 2021-08-30 NOTE — Progress Notes (Signed)
Patient has agreed and message sent to wanda to do referral.

## 2021-08-30 NOTE — Telephone Encounter (Signed)
Referral to Endo placed

## 2021-09-06 ENCOUNTER — Other Ambulatory Visit: Payer: Self-pay | Admitting: Internal Medicine

## 2021-09-06 ENCOUNTER — Other Ambulatory Visit: Payer: Self-pay | Admitting: Interventional Cardiology

## 2021-09-07 NOTE — Telephone Encounter (Signed)
Xarelto 20mg  refill request received. Pt is 70 years old, weight-123.7kg, Crea-1.40 on 08/09/2021, last seen by Dr. [name] on 08/26/2021, Diagnosis-PE, CrCl-74.74ml/min; Dose is appropriate based on dosing criteria. Will send in refill to requested pharmacy.

## 2021-09-13 ENCOUNTER — Other Ambulatory Visit: Payer: PRIVATE HEALTH INSURANCE | Admitting: Internal Medicine

## 2021-09-21 ENCOUNTER — Encounter: Payer: Medicare Other | Admitting: Internal Medicine

## 2021-10-15 ENCOUNTER — Telehealth: Payer: Self-pay | Admitting: Endocrinology

## 2021-10-15 NOTE — Telephone Encounter (Signed)
LMx1 Schedule from referral

## 2021-10-16 ENCOUNTER — Other Ambulatory Visit: Payer: Self-pay | Admitting: Internal Medicine

## 2021-10-16 ENCOUNTER — Other Ambulatory Visit: Payer: Self-pay | Admitting: Interventional Cardiology

## 2021-10-28 NOTE — Telephone Encounter (Signed)
Lmx2 Schedule appt from referral

## 2021-11-11 ENCOUNTER — Other Ambulatory Visit: Payer: Medicare Other | Admitting: Internal Medicine

## 2021-11-11 ENCOUNTER — Other Ambulatory Visit: Payer: Self-pay

## 2021-11-11 ENCOUNTER — Encounter: Payer: Self-pay | Admitting: Internal Medicine

## 2021-11-11 DIAGNOSIS — Z1329 Encounter for screening for other suspected endocrine disorder: Secondary | ICD-10-CM

## 2021-11-11 DIAGNOSIS — E1169 Type 2 diabetes mellitus with other specified complication: Secondary | ICD-10-CM

## 2021-11-11 DIAGNOSIS — E119 Type 2 diabetes mellitus without complications: Secondary | ICD-10-CM | POA: Diagnosis not present

## 2021-11-11 DIAGNOSIS — E785 Hyperlipidemia, unspecified: Secondary | ICD-10-CM | POA: Diagnosis not present

## 2021-11-11 DIAGNOSIS — I10 Essential (primary) hypertension: Secondary | ICD-10-CM | POA: Diagnosis not present

## 2021-11-12 LAB — CBC WITH DIFFERENTIAL/PLATELET
Absolute Monocytes: 946 cells/uL (ref 200–950)
Basophils Absolute: 55 cells/uL (ref 0–200)
Basophils Relative: 0.5 %
Eosinophils Absolute: 385 cells/uL (ref 15–500)
Eosinophils Relative: 3.5 %
HCT: 38.3 % (ref 35.0–45.0)
Hemoglobin: 12.2 g/dL (ref 11.7–15.5)
Lymphs Abs: 3938 cells/uL — ABNORMAL HIGH (ref 850–3900)
MCH: 27.1 pg (ref 27.0–33.0)
MCHC: 31.9 g/dL — ABNORMAL LOW (ref 32.0–36.0)
MCV: 85.1 fL (ref 80.0–100.0)
MPV: 11.5 fL (ref 7.5–12.5)
Monocytes Relative: 8.6 %
Neutro Abs: 5676 cells/uL (ref 1500–7800)
Neutrophils Relative %: 51.6 %
Platelets: 284 10*3/uL (ref 140–400)
RBC: 4.5 10*6/uL (ref 3.80–5.10)
RDW: 13.3 % (ref 11.0–15.0)
Total Lymphocyte: 35.8 %
WBC: 11 10*3/uL — ABNORMAL HIGH (ref 3.8–10.8)

## 2021-11-12 LAB — LIPID PANEL
Cholesterol: 104 mg/dL (ref <200)
HDL: 44 mg/dL — ABNORMAL LOW (ref >=50)
LDL Cholesterol (Calc): 42 mg/dL (calc)
Non-HDL Cholesterol (Calc): 60 mg/dL (calc) (ref <130)
Total CHOL/HDL Ratio: 2.4 (calc) (ref <5.0)
Triglycerides: 94 mg/dL (ref <150)

## 2021-11-12 LAB — COMPLETE METABOLIC PANEL WITH GFR
AG Ratio: 1.3 (calc) (ref 1.0–2.5)
ALT: 15 U/L (ref 6–29)
AST: 16 U/L (ref 10–35)
Albumin: 3.5 g/dL — ABNORMAL LOW (ref 3.6–5.1)
Alkaline phosphatase (APISO): 77 U/L (ref 37–153)
BUN/Creatinine Ratio: 10 (calc) (ref 6–22)
BUN: 17 mg/dL (ref 7–25)
CO2: 32 mmol/L (ref 20–32)
Calcium: 9.2 mg/dL (ref 8.6–10.4)
Chloride: 104 mmol/L (ref 98–110)
Creat: 1.73 mg/dL — ABNORMAL HIGH (ref 0.50–1.05)
Globulin: 2.7 g/dL (calc) (ref 1.9–3.7)
Glucose, Bld: 106 mg/dL — ABNORMAL HIGH (ref 65–99)
Potassium: 4.7 mmol/L (ref 3.5–5.3)
Sodium: 144 mmol/L (ref 135–146)
Total Bilirubin: 0.3 mg/dL (ref 0.2–1.2)
Total Protein: 6.2 g/dL (ref 6.1–8.1)
eGFR: 32 mL/min/{1.73_m2} — ABNORMAL LOW (ref >=60)

## 2021-11-12 LAB — HEMOGLOBIN A1C
Hgb A1c MFr Bld: 6 % of total Hgb — ABNORMAL HIGH (ref <5.7)
Mean Plasma Glucose: 126 mg/dL
eAG (mmol/L): 7 mmol/L

## 2021-11-12 LAB — TSH: TSH: 0.92 mIU/L (ref 0.40–4.50)

## 2021-11-16 ENCOUNTER — Other Ambulatory Visit: Payer: Self-pay

## 2021-11-16 ENCOUNTER — Encounter: Payer: Self-pay | Admitting: Internal Medicine

## 2021-11-16 ENCOUNTER — Ambulatory Visit (INDEPENDENT_AMBULATORY_CARE_PROVIDER_SITE_OTHER): Payer: Medicare Other | Admitting: Internal Medicine

## 2021-11-16 VITALS — BP 118/68 | HR 53 | Temp 98.4°F | Resp 18 | Ht 63.75 in | Wt 269.0 lb

## 2021-11-16 DIAGNOSIS — E8940 Asymptomatic postprocedural ovarian failure: Secondary | ICD-10-CM | POA: Diagnosis not present

## 2021-11-16 DIAGNOSIS — Z0389 Encounter for observation for other suspected diseases and conditions ruled out: Secondary | ICD-10-CM

## 2021-11-16 DIAGNOSIS — Z86711 Personal history of pulmonary embolism: Secondary | ICD-10-CM | POA: Diagnosis not present

## 2021-11-16 DIAGNOSIS — Z1231 Encounter for screening mammogram for malignant neoplasm of breast: Secondary | ICD-10-CM | POA: Diagnosis not present

## 2021-11-16 DIAGNOSIS — G4730 Sleep apnea, unspecified: Secondary | ICD-10-CM | POA: Diagnosis not present

## 2021-11-16 DIAGNOSIS — M25531 Pain in right wrist: Secondary | ICD-10-CM

## 2021-11-16 DIAGNOSIS — R82998 Other abnormal findings in urine: Secondary | ICD-10-CM | POA: Diagnosis not present

## 2021-11-16 DIAGNOSIS — E1169 Type 2 diabetes mellitus with other specified complication: Secondary | ICD-10-CM

## 2021-11-16 DIAGNOSIS — F419 Anxiety disorder, unspecified: Secondary | ICD-10-CM

## 2021-11-16 DIAGNOSIS — G4733 Obstructive sleep apnea (adult) (pediatric): Secondary | ICD-10-CM

## 2021-11-16 DIAGNOSIS — I10 Essential (primary) hypertension: Secondary | ICD-10-CM

## 2021-11-16 DIAGNOSIS — Z0000 Encounter for general adult medical examination without abnormal findings: Secondary | ICD-10-CM

## 2021-11-16 DIAGNOSIS — Z90710 Acquired absence of both cervix and uterus: Secondary | ICD-10-CM | POA: Diagnosis not present

## 2021-11-16 DIAGNOSIS — E785 Hyperlipidemia, unspecified: Secondary | ICD-10-CM

## 2021-11-16 DIAGNOSIS — I5022 Chronic systolic (congestive) heart failure: Secondary | ICD-10-CM | POA: Diagnosis not present

## 2021-11-16 DIAGNOSIS — F32A Depression, unspecified: Secondary | ICD-10-CM

## 2021-11-16 LAB — POCT URINALYSIS DIPSTICK
Bilirubin, UA: NEGATIVE
Blood, UA: NEGATIVE
Glucose, UA: NEGATIVE
Ketones, UA: NEGATIVE
Nitrite, UA: NEGATIVE
Protein, UA: NEGATIVE
Spec Grav, UA: 1.02 (ref 1.010–1.025)
Urobilinogen, UA: 0.2 E.U./dL
pH, UA: 5 (ref 5.0–8.0)

## 2021-11-16 NOTE — Progress Notes (Signed)
Annual Wellness Visit     Patient: [name], Female    DOB: January 11, 1951, 71 y.o.   MRN: [id number] Visit Date: 11/16/2021  Subjective  She is also here for health maintenance exam and evaluation of medical issues. She is having trouble with pain right wrist. Worked for years as a Public relations account executive. Says right hand hurts as well.  Likely needs to see orthopedist regarding this.  Has elevated serum creatinine that needs to be repeated when well hydrated. Could be devloping chronic kidney disease. Could be related to ARB medication. We will repeat kidney functions when well hydrated.  Addendum: Creatinine repeated on January 10th is normal with hydration  She underwent a laparoscopic cholecystectomy by Dr. Ninfa Linden in April 2022 after presenting here in November complaining of an episode of abdominal pain triggered by eating at [place] followed by intermittent discomfort for several weeks.  She has a history of morbid obesity.  History of osteoarthritis of both knees.  History of angioedema.  Remains on chronic Xarelto therapy after having an acute saddle pulmonary embolism with acute cor pulmonale March 2018.  History of lichen planus involving the mouth which has intermittent exacerbations.  History of diverticulitis of the proximal descending colon status post laparoscopic-assisted left colectomy June 2014 by Dr. Zella Richer.  History of hypothyroidism.  History of right shoulder arthroscopic surgery, acromioplasty and rotator cuff repair in January 2016.  History of GE reflux, depression, insomnia, ulcerative colitis, hypokalemia, migraine headaches, asthma and history of B12 deficiency.  History of positive allergy skin test to dust mites and mold in 2009.  FEV1 was shown to improve with inhaled albuterol.  Sometimes she  wheezes and needs an inhaler.  Admitted December 2007 with syncope.  Had herpes zoster July 2007.  Hospitalized with probable viral  gastroenteritis April 2011.  Surgery for chronic sinusitis November 2010.  History of gallstones and hepatic steatosis.  History of iron deficiency anemia that resolved after hysterectomy.  History of noncardiac and nonpulmonary chest pain in 2011.  At that time she had negative cardiac enzymes, negative EKG, negative CT angio although her D-dimer was elevated.  Additional past medical history: Fractured left radial head 1995, epicondylitis right elbow in the past, strep throat January 2005, right upper lobe pneumonia March 2011, C-section 1977, abdominal hysterectomy with right salpingectomy January 1998.  History of bilateral cataract extractions.  Repair of left knee medial meniscus June 2008.  Last colonoscopy was in 2014 with 10-year follow-up recommended.  She only had diverticulosis during the study.  She had colonoscopy in 2009 showing severe colitis of the left colon.  She previously had pan endoscopy and findings were seen in the terminal ileum that were felt to possibly be inflammatory bowel disease or inflammation related to anti-inflammatory medications.  Biopsy in June 2009 revealed ulcerative colonic mucosa with granulomas.  She has been a patient in this practice since September 1996.  Dr. Pernell Dupre is her cardiologist.  History of subcritical LAD disease with heart catheterization 2020.  History of 60 to 70% mid LAD stenosis.  History of diabetes mellitus.  Social history: She is divorced.  Does not smoke.  Occasional alcohol consumption.  Longstanding history of morbid obesity.  Weight in 1996 was 307 pounds.  She retired as Forensic psychologist at [employer].       [name] is a 71 y.o. female who presents today for her Annual Wellness Visit.     Social History   Social  History Narrative   Social history: She is divorced.  Does not smoke.  Occasional alcohol consumption.  Longstanding history of morbid obesity.  Weight in 1996 was 307  pounds.  She retired as Forensic psychologist at [employer].       Family history: Father deceased with history of stroke, hypertension, diabetes and COPD.  Mother deceased with history of congestive heart failure and chronic kidney disease.  1 sister and 1 son in good health.    Patient Care Team: Elby Showers, MD as PCP - General (Internal Medicine) Belva Crome, MD as PCP - Cardiology (Cardiology) Jackolyn Confer, MD as Consulting Physician (General Surgery) Gatha Mayer, MD as Consulting Physician (Gastroenterology)  Review of Systems right wrist and hand pain   Objective    Vitals:  Blood pressure 118/68 pulse 53 respiratory rate 18 temperature 98.4 degrees pulse oximetry 95% weight 269 pounds height 5 feet 3.75 inches BMI 46.54  Physical Exam Skin: Warm and dry.  No cervical adenopathy.  No thyromegaly.  No carotid bruits.  Chest is clear to auscultation.  Cardiac exam: Regular rate and rhythm without ectopy.  Breasts are without masses.  Abdomen is obese, soft, nondistended without tenderness.  Trace lower edema of the lower extremities.  Neurological exam is intact without focal deficits.  Affect thought and judgment are normal.  Do not see inflammation of the right wrist in terms of redness or increased warmth but has pain with motion.  Most recent functional status assessment: In your present state of health, do you have any difficulty performing the following activities: 11/16/2021  Hearing? N  Vision? N  Difficulty concentrating or making decisions? N  Walking or climbing stairs? Y  Dressing or bathing? N  Doing errands, shopping? N  Preparing Food and eating ? N  Using the Toilet? N  In the past six months, have you accidently leaked urine? N  Do you have problems with loss of bowel control? N  Managing your Medications? N  Managing your Finances? N  Housekeeping or managing your Housekeeping? N  Some recent data might be hidden   Most  recent fall risk assessment: Fall Risk  11/16/2021  Falls in the past year? 0  Number falls in past yr: -  Comment -  Injury with Fall? 0  Risk for fall due to : Impaired balance/gait;Impaired mobility  Risk for fall due to: Comment -  Follow up Falls evaluation completed    Most recent depression screenings: PHQ 2/9 Scores 11/16/2021 09/18/2020  PHQ - 2 Score 0 0  PHQ- 9 Score - -   Most recent cognitive screening: 6CIT Screen 11/16/2021  What Year? 0 points  What month? 0 points  What time? 0 points  Count back from 20 0 points  Months in reverse 0 points  Repeat phrase 0 points  Total Score 0       Assessment & Plan   Suggest she call her orthopedist regarding right wrist and hand pain.  Does not appear to be acute gout.  Could be some mild osteoarthritis with years of working as a Optician, dispensing.  Rheumatology studies are negative  Continue Cardiology follow-up with Dr. [name]  Regarding morbid obesity, continue diet efforts.  Not really able to exercise due to cardiac issues  Essential hypertension-stable on current regimen  History of bilateral pulmonary emboli treated with chronic anticoagulation  End-stage osteoarthritis of knees the patient has declined arthroplasty  Type 2 diabetes mellitus-fasting glucose is 131.  Hemoglobin A1c in December was 6.0%  Lipid panel normal with the exception of a low HDL of 44  Hypothyroidism  TSH is normal on thyroid replacement medication  History of asthma treated with inhaler  History of lichen planus  History of nongranulomatous colitis but last colonoscopy only showed diverticulosis  GE reflux treated with PPI  History of angioedema  History of allergic rhinitis  History of vitamin D deficiency-level has not been checked due to expense  Dependent edema  Plan: Rheumatology studies drawn and are within normal limits.  May need to see orthopedist regarding wrist and hand pain.  Return in 6 months or as needed.   Continue diet efforts and continue close follow-up with Cardiology.    Annual wellness visit done today including the all of the following: Reviewed patient's Family Medical History Reviewed and updated list of patient's medical providers Assessment of cognitive impairment was done Assessed patient's functional ability Established a written schedule for health screening [place] Completed and Reviewed  Discussed health benefits of physical activity, and encouraged her to engage in regular exercise appropriate for her age and condition.         IElby Showers, MD, have reviewed all documentation for this visit. The documentation on 12/12/21 for the exam, diagnosis, procedures, and orders are all accurate and complete.   Angus Seller, CMA

## 2021-11-16 NOTE — Patient Instructions (Addendum)
Set up time for lab when well hydrated and repeat B-met and do sed rate, RF, CCP, ANA  due to chronic hand pain. May be developing chronic kidney disease. Is on ARB. Xray of right wrist. If symptoms persist, may need to see hand surgeon. She prefers Dr. Amedeo Plenty.  Her repeat basic metabolic panel in January showed normal BUN and creatinine.  Elevated creatinine at last visit was likely due to fasting state.  She should try to stay well-hydrated.  Her rheumatology studies that were drawn in January are negative.  Urine culture grew strep agalactiae which is a contaminant.  She is due for follow-up in 6 months

## 2021-11-17 LAB — URINE CULTURE
MICRO NUMBER:: 12820636
SPECIMEN QUALITY:: ADEQUATE

## 2021-11-18 LAB — CBC WITH DIFFERENTIAL/PLATELET

## 2021-11-18 LAB — MICROALBUMIN / CREATININE URINE RATIO
Creatinine, Urine: 99 mg/dL (ref 20–275)
Microalb Creat Ratio: 8 mcg/mg creat (ref <30)
Microalb, Ur: 0.8 mg/dL

## 2021-11-18 LAB — LIPID PANEL

## 2021-11-18 LAB — COMPLETE METABOLIC PANEL WITH GFR

## 2021-11-18 LAB — HEMOGLOBIN A1C

## 2021-11-18 LAB — TSH

## 2021-11-23 ENCOUNTER — Other Ambulatory Visit: Payer: Self-pay

## 2021-11-23 ENCOUNTER — Other Ambulatory Visit: Payer: Medicare Other | Admitting: Internal Medicine

## 2021-11-23 ENCOUNTER — Ambulatory Visit
Admission: RE | Admit: 2021-11-23 | Discharge: 2021-11-23 | Disposition: A | Discharge status: Home / Self Care | Payer: Medicare Other | Source: Ambulatory Visit | Attending: Internal Medicine | Admitting: Internal Medicine

## 2021-11-23 DIAGNOSIS — M79641 Pain in right hand: Secondary | ICD-10-CM

## 2021-11-23 DIAGNOSIS — M25531 Pain in right wrist: Secondary | ICD-10-CM

## 2021-11-25 LAB — BASIC METABOLIC PANEL
BUN: 19 mg/dL (ref 7–25)
CO2: 34 mmol/L — ABNORMAL HIGH (ref 20–32)
Calcium: 9.4 mg/dL (ref 8.6–10.4)
Chloride: 99 mmol/L (ref 98–110)
Creat: 0.96 mg/dL (ref 0.50–1.05)
Glucose, Bld: 131 mg/dL — ABNORMAL HIGH (ref 65–99)
Potassium: 4.5 mmol/L (ref 3.5–5.3)
Sodium: 139 mmol/L (ref 135–146)

## 2021-11-25 LAB — ANA: Anti Nuclear Antibody (ANA): NEGATIVE

## 2021-11-25 LAB — RHEUMATOID FACTOR: Rheumatoid fact SerPl-aCnc: <14 IU/mL (ref <14)

## 2021-11-25 LAB — SEDIMENTATION RATE: Sed Rate: 17 mm/h (ref 0–30)

## 2021-11-25 LAB — CYCLIC CITRUL PEPTIDE ANTIBODY, IGG: Cyclic Citrullin Peptide Ab: <16 UNITS

## 2021-12-28 ENCOUNTER — Telehealth: Payer: Self-pay | Admitting: Internal Medicine

## 2021-12-28 NOTE — Telephone Encounter (Signed)
Called patient schedule for Video Visit, she is to take COVID test in the morning and call me with results

## 2021-12-28 NOTE — Telephone Encounter (Signed)
[name] [phone]  [name] called to say for the last 3 weeks she has been having Left leg and knee pain, she can hardly bend it, no swelling, no falls. While I was talking to her I notice coughing, so I ask if she was having any symptoms and she stated that she started having runny nose and scratchy throat yesterday.

## 2021-12-29 ENCOUNTER — Encounter: Payer: Self-pay | Admitting: Internal Medicine

## 2021-12-29 ENCOUNTER — Other Ambulatory Visit: Payer: Self-pay

## 2021-12-29 ENCOUNTER — Telehealth (INDEPENDENT_AMBULATORY_CARE_PROVIDER_SITE_OTHER): Payer: Medicare Other | Admitting: Internal Medicine

## 2021-12-29 VITALS — BP 110/63 | Temp 97.7°F

## 2021-12-29 DIAGNOSIS — Z86711 Personal history of pulmonary embolism: Secondary | ICD-10-CM | POA: Diagnosis not present

## 2021-12-29 DIAGNOSIS — M79652 Pain in left thigh: Secondary | ICD-10-CM | POA: Diagnosis not present

## 2021-12-29 DIAGNOSIS — J22 Unspecified acute lower respiratory infection: Secondary | ICD-10-CM | POA: Diagnosis not present

## 2021-12-29 DIAGNOSIS — Z7901 Long term (current) use of anticoagulants: Secondary | ICD-10-CM | POA: Diagnosis not present

## 2021-12-29 DIAGNOSIS — I5022 Chronic systolic (congestive) heart failure: Secondary | ICD-10-CM | POA: Diagnosis not present

## 2021-12-29 DIAGNOSIS — M1712 Unilateral primary osteoarthritis, left knee: Secondary | ICD-10-CM | POA: Diagnosis not present

## 2021-12-29 MED ORDER — HYDROCODONE BIT-HOMATROP MBR 5-1.5 MG/5ML PO SOLN: 5.0000 mL | Freq: Three times a day (TID) | ORAL | 0 refills | Status: DC | PRN

## 2021-12-29 MED ORDER — AZITHROMYCIN 250 MG PO TABS: ORAL_TABLET | ORAL | 0 refills | Status: AC

## 2021-12-29 NOTE — Progress Notes (Signed)
Subjective:    Patient ID: [name], female    DOB: November 23, 1950, 71 y.o.   MRN: [id number]  HPI 71 year old Female seen today by interactive audio and video telecommunications due to the Coronavirus pandemic.  Patient is agreeable to visit in this format today.  Patient is at her home and I am at my office.  She is identified using 2 identifiers as [name]. [name], a longstanding patient in this practice  Patient has 2 separate problems today.  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  On Sunday she awakened with a scratchy throat, runny nose, ears crackling and a cough.  She has tested negative for COVID-19.  She has some discolored sputum.  She did wear a mask while delivering the Meals on Wheels.  She has no documented fever but has cough and malaise.  No shortness of breath.  I records indicates she had a COVID booster in October 2022.  She has had pneumococcal vaccines and gets an annual flu vaccine.  She did have COVID-19 in May 2022  She has multiple medical problems including coronary artery disease, morbid obesity, sleep apnea, type 2 diabetes mellitus, history of pulmonary embolism, chronic anticoagulation therapy, essential hypertension, hyperlipidemia.  History of pulmonary embolus diagnosed in 2018 right lung causing right heart strain.  Dopplers did not show any clot in the leg veins but history suggested perhaps a clot in right calf.  She has swelling in right popliteal fossa.  It was Dr. [name] is feeling that she should remain on anticoagulation indefinitely.  Weight age and sedentary lifestyle were thought to be increased risk factors.  She reports no significant shortness of breath and does not appear to be dyspneic on exam.  The second problem surrounds issues with her left knee and posterior lower thigh.  Her 3 weeks she says she has been hobbling around.  She awakened 1 morning and noticed there was pain and stiffness in her  lower thigh.  Is having issues bending her left knee.  She is on Xarelto long-term for cardiac issues and history of pulmonary embolism.  This problem has been going on between 3 and 4 weeks.  Her knee is not swollen she says.  It is not red or hot to touch.  She did have repair of left medial meniscus in June 2008.  For additional history please see complete physical exam October 2019.  Review of Systems no fever or shaking chills.  No nausea or vomiting.     Objective:   Physical Exam She is seen virtually today and is able to give a clear concise history.  Does not appear to be tachypneic.  Not heard to be coughing during the interview.  Her knee was not examined today     Assessment & Plan:  Left knee arthropathy-she will need to see orthopedist.  May benefit from physical therapy and/or steroid injection of the knee.  Recommended moist compresses to knee and thigh area  Acute lower respiratory infection-has tested negative for COVID-19.  Treat with Zithromax Z-PAK and Hycodan 1 teaspoon every 6 hours as needed for cough.  Hycodan will also help her knee pain at the present time.   History of chronic anticoagulation with prior history of PE  Osteoarthritis of both knees  Repair of left knee medial meniscus June 2008  Obstructive sleep apnea  Morbid obesity  History of 40 to 60% stenosis mid LAD with normal ejection fraction 65% and normal  LV function based on angiography by Dr. Pernell Dupre March 2020.  Was placed on high-dose statin.  Time spent with chart review, interviewing patient, medical decision making and E scribing medications is 30 minutes

## 2021-12-29 NOTE — Telephone Encounter (Signed)
COVID test was negative last night and this morning

## 2022-01-06 ENCOUNTER — Ambulatory Visit: Payer: Medicare Other | Admitting: Orthopaedic Surgery

## 2022-01-09 ENCOUNTER — Other Ambulatory Visit: Payer: Self-pay | Admitting: Internal Medicine

## 2022-01-12 ENCOUNTER — Other Ambulatory Visit: Payer: Self-pay

## 2022-01-12 ENCOUNTER — Ambulatory Visit (INDEPENDENT_AMBULATORY_CARE_PROVIDER_SITE_OTHER): Payer: Medicare Other | Admitting: Orthopaedic Surgery

## 2022-01-12 ENCOUNTER — Ambulatory Visit (INDEPENDENT_AMBULATORY_CARE_PROVIDER_SITE_OTHER): Payer: Medicare Other

## 2022-01-12 DIAGNOSIS — M1712 Unilateral primary osteoarthritis, left knee: Secondary | ICD-10-CM

## 2022-01-12 MED ORDER — PREDNISONE 5 MG (21) PO TBPK: ORAL_TABLET | ORAL | 0 refills | Status: DC

## 2022-01-12 MED ORDER — TRAMADOL HCL 50 MG PO TABS: 50.0000 mg | ORAL_TABLET | Freq: Two times a day (BID) | ORAL | 2 refills | Status: DC | PRN

## 2022-01-12 NOTE — Progress Notes (Signed)
? ?Office Visit Note ?  ?Patient: [name]          f ?Date of Birth: 01-23-1951           ?MRN: [id number] ?Visit Date: 01/12/2022 ?             ?Requested by: Elby Showers, MD ?[address] ?[place],  [place] [address] ?PCP: Elby Showers, MD ? ? ?Assessment & Plan: ?Visit Diagnoses:  ?1. Primary osteoarthritis of left knee   ? ? ?Plan: Impression is left knee osteoarthritis with recent flareup.  I believe the popliteal fossa fullness she is getting is from the Baker's cyst.  We have discussed proceeding with fluoroscopic guided intra-articular cortisone injection with Dr. Ernestina Patches.  Have also called in a small prescription of tramadol.  She will follow-up with [place] as needed. ? ?Follow-Up Instructions: Return if symptoms worsen or fail to improve.  ? ?Orders:  ?Orders Placed This Encounter  ?Procedures  ? XR KNEE 3 VIEW LEFT  ? Ambulatory referral to Physical Medicine Rehab  ? ?Meds ordered this encounter  ?Medications  ? traMADol (ULTRAM) 50 MG tablet  ?  Sig: Take 1 tablet (50 mg total) by mouth every 12 (twelve) hours as needed.  ?  Dispense:  30 tablet  ?  Refill:  2  ? predniSONE (STERAPRED UNI-PAK 21 TAB) 5 MG (21) TBPK tablet  ?  Sig: Take as directed  ?  Dispense:  21 tablet  ?  Refill:  0  ? ? ? ? Procedures: ?No procedures performed ? ? ?Clinical Data: ?No additional findings. ? ? ?Subjective: ?Chief Complaint  ?Patient presents with  ? Left Knee - Pain  ? ? ?HPI patient is a pleasant 71 year old female who comes in today with left knee pain for the past several years.  She notes that her symptoms of increased over the past 5 to 6 weeks without any injury or change in activity.  The pain is primarily to the popliteal fossa.  She describes this as an inability to fully flex her knee.  She has been taking Tylenol without significant relief.  She notes that she is undergone cortisone injections many years ago which did seem to help at that time. ? ?Review of Systems as detailed in HPI.   All others reviewed and are negative. ? ? ?Objective: ?Vital Signs: There were no vitals taken for this visit. ? ?Physical Exam well-developed well-nourished female in no acute distress.  Alert and oriented x3. ? ?Ortho Exam left knee exam: Somewhat hard to examine due to body habitus.  She does not have any significant pain to the joint line or popliteal fossa.  She does have some fullness to the popliteal fossa.  No calf pain.  Range of motion 5 to 95 degrees.  She is neurovascular intact distally. ? ?Specialty Comments:  ?No specialty comments available. ? ?Imaging: ?XR KNEE 3 VIEW LEFT ? ?Result Date: 01/12/2022 ?X-rays demonstrate advanced degenerative changes primarily to the medial and patellofemoral compartments  ? ? ?PMFS History: ?Patient Active Problem List  ? Diagnosis Date Noted  ? Other secondary pulmonary hypertension ([place]) 04/22/2019  ? Chest pain 01/14/2019  ? Leukocytosis 01/14/2019  ? Vomiting 01/14/2019  ? Unstable angina ([place]) 01/14/2019  ? OSA (obstructive sleep apnea) 11/27/2018  ? Pulmonary embolism with acute cor pulmonale ([place]) 01/19/2017  ? Acute saddle pulmonary embolism with acute cor pulmonale ([place]) 01/19/2017  ? Preoperative cardiovascular examination 08/04/2016  ? Abnormal EKG 08/04/2016  ? Complete  tear of right rotator cuff 12/05/2014  ? Wound healing, delayed 05/06/2013  ? Candidal skin infection 05/06/2013  ? H/O diverticulitis of SIGMOID and left colon s/p laparoscopic assisted left colectomy 04/22/13 02/15/2013  ? Diverticulitis of proximal descending colon 02/07/2013  ? Hypokalemia 01/26/2013  ? Type 2 diabetes mellitus ([place]) 08/13/2012  ? Morbid obesity ([place]) 08/13/2012  ? Insomnia 08/13/2012  ? History of ulcerative colitis 08/13/2012  ? Musculoskeletal pain 08/13/2012  ? Constipation 02/07/2012  ? Hyperlipidemia due to dietary fat intake 02/07/2012  ? History of migraine headaches 02/07/2012  ? Lichen planus 54/00/8676  ? Allergic rhinitis 02/07/2012  ? Angioedema 02/07/2012  ?  GERD 02/26/2010  ? VITAMIN B12 DEFICIENCY 05/05/2008  ? Hypothyroidism 04/29/2008  ? Depression 04/29/2008  ? Essential hypertension 04/29/2008  ? ASTHMA 04/29/2008  ? DIVERTICULOSIS, COLON 12/11/2006  ? H/O: hysterectomy 02/15/1998  ? ?Past Medical History:  ?Diagnosis Date  ? Anemia   ? none recently  ? Anxiety   ? Arthritis   ? Asthma   ? Complete tear of right rotator cuff 12/05/2014  ? Coronary artery disease   ? Depression   ? Diabetes mellitus   ? Last A1C was 6.2 on 09/18/2020  ? Diverticulitis   ? Diverticulosis   ? Family history of adverse reaction to anesthesia   ? mom  nausea and vomiting  ? GERD (gastroesophageal reflux disease)   ? History of kidney stones   ? seen on scan, has not been a problem.  ? Hypertension   ? Hypothyroidism   ? Migraine   ? Personal history of kidney stones   ? Pneumonia   ? hx  ? Pulmonary embolism ([place])   ? a. diagnosed in 01/2017 w/ imaging showing multiple right lung PE with right heart strain. Started on Xarelto.   ? Shortness of breath dyspnea   ? exersion  ? Thyroid disease   ? hypo  ? Wears glasses   ?  ?Family History  ?Problem Relation Age of Onset  ? Heart disease Mother   ? Kidney failure Father   ? Diabetes Father   ? Breast cancer Paternal Aunt   ? Colon cancer Neg Hx   ?  ?Past Surgical History:  ?Procedure Laterality Date  ? ABDOMINAL HYSTERECTOMY    ? CATARACT EXTRACTION, BILATERAL    ? CESAREAN SECTION    ? CHOLECYSTECTOMY N/A 02/16/2021  ? Procedure: LAPAROSCOPIC CHOLECYSTECTOMY;  Surgeon: Coralie Keens, MD;  Location: [facility];  Service: General;  Laterality: N/A;  ? COLONOSCOPY    ? multiple   ? ESOPHAGOGASTRODUODENOSCOPY ENDOSCOPY    ? multiple  ? INTRAVASCULAR PRESSURE WIRE/FFR STUDY N/A 01/14/2019  ? Procedure: INTRAVASCULAR PRESSURE WIRE/FFR STUDY;  Surgeon: Belva Crome, MD;  Location: [facility];  Service: Cardiovascular;  Laterality: N/A;  ? KNEE CARTILAGE SURGERY    ? Left  ? LAPAROSCOPIC PARTIAL COLECTOMY N/A 04/22/2013  ? Procedure:  LAPAROSCOPIC PARTIAL COLECTOMY;  Surgeon: Odis Hollingshead, MD;  Location: [facility];  Service: General;  Laterality: N/A;  ? LEFT HEART CATH AND CORONARY ANGIOGRAPHY N/A 01/14/2019  ? Procedure: LEFT HEART CATH AND CORONARY ANGIOGRAPHY;  Surgeon: Belva Crome, MD;  Location: [facility];  Service: Cardiovascular;  Laterality: N/A;  ? right foot surgery  little toe and next toe  ? x 3  ? SHOULDER ARTHROSCOPY WITH ROTATOR CUFF REPAIR AND SUBACROMIAL DECOMPRESSION Right 12/05/2014  ? Procedure: RIGHT SHOULDER ARTHROSCOPY,ACROMOPLASTY, ROTATOR CUFF REPAIR;  Surgeon: [name],  MD;  Location: [facility];  Service: Orthopedics;  Laterality: Right;  ? ?Social History  ? ?Occupational History  ? Not on file  ?Tobacco Use  ? Smoking status: Former  ?  Packs/day: 0.50  ?  Years: 6.00  ?  Pack years: 3.00  ?  Types: Cigarettes  ?  Quit date: 11/14/1982  ?  Years since quitting: 39.1  ? Smokeless tobacco: Never  ? Tobacco comments:  ?  quit smoking 30 years ago  ?Vaping Use  ? Vaping Use: Never used  ?Substance and Sexual Activity  ? Alcohol use: No  ? Drug use: No  ? Sexual activity: Not on file  ? ? ? ? ? ? ?

## 2022-01-27 ENCOUNTER — Other Ambulatory Visit: Payer: Self-pay

## 2022-01-27 ENCOUNTER — Encounter: Payer: Self-pay | Admitting: Physical Medicine and Rehabilitation

## 2022-01-27 ENCOUNTER — Ambulatory Visit: Payer: Medicare Other

## 2022-01-27 ENCOUNTER — Ambulatory Visit (INDEPENDENT_AMBULATORY_CARE_PROVIDER_SITE_OTHER): Payer: Medicare Other | Admitting: Physical Medicine and Rehabilitation

## 2022-01-27 DIAGNOSIS — G8929 Other chronic pain: Secondary | ICD-10-CM | POA: Diagnosis not present

## 2022-01-27 DIAGNOSIS — M25562 Pain in left knee: Secondary | ICD-10-CM | POA: Diagnosis not present

## 2022-01-27 NOTE — Progress Notes (Signed)
Pt state left knee pain. Pt state bending her knee and walking makes the pain worse. Pt state she takes pain meds and uses heat to help ease her pain. ? ?Numeric Pain Rating Scale and Functional Assessment ?Average Pain 6 ? ? ?In the last MONTH (on 0-10 scale) has pain interfered with the following? ? ?1. General activity like being  able to carry out your everyday physical activities such as walking, climbing stairs, carrying groceries, or moving a chair?  ?Rating(9) ? ? ? +BT, -Dye Allergies.' ?

## 2022-01-27 NOTE — Progress Notes (Signed)
? ?  [name] - 71 y.o. female MRN [id number]  Date of birth: 27-Nov-1950 ? ?Office Visit Note: ?Visit Date: 01/27/2022 ?PCP: Elby Showers, MD ?Referred by: Elby Showers, MD ? ?Subjective: ?Chief Complaint  ?Patient presents with  ? Left Knee - Pain  ? ?HPI:  [name] is a 71 y.o. female who comes in today at the request of Dr. Eduard Roux for planned Left anesthetic knee arthrogram with fluoroscopic guidance.  The patient has failed conservative care including home exercise, medications, time and activity modification.  This injection will be diagnostic and hopefully therapeutic.  Please see requesting physician notes for further details and justification.  ? ?ROS Otherwise per HPI. ? ?Assessment & Plan: ?Visit Diagnoses:  ?  ICD-10-CM   ?1. Chronic pain of left knee  M25.562 XR C-ARM NO REPORT  ? G89.29 Large Joint Inj: L knee  ?  ?  ?Plan: No additional findings.  ? ?Meds & Orders: No orders of the defined types were placed in this encounter. ?  ?Orders Placed This Encounter  ?Procedures  ? Large Joint Inj: L knee  ? XR C-ARM NO REPORT  ?  ?Follow-up: Return if symptoms worsen or fail to improve.  ? ?Procedures: ?Large Joint Inj: L knee on 01/27/2022 3:06 PM ?Indications: pain and diagnostic evaluation ?Details: 22 G 3.5 in needle, fluoroscopy-guided anterolateral approach ? ?Arthrogram: No ? ?Medications: 40 mg triamcinolone acetonide 40 MG/ML; 4 mL bupivacaine 0.25 % ?Outcome: tolerated well, no immediate complications ? ?There was excellent flow of contrast producing a partial arthrogram of the knee. The patient did have relief of symptoms during the anesthetic phase of the injection. ? ?Consent was given by the patient. Immediately prior to procedure a time out was called to verify the correct patient, procedure, equipment, support staff and site/side marked as required. Patient was prepped and draped in the usual sterile fashion.  ? ?  ?   ? ?Clinical History: ?No specialty  comments available.  ? ? ? ?Objective:  VS:  HT:    WT:   BMI:     BP:   HR: bpm  TEMP: ( )  RESP:  ?Physical Exam  ? ?Imaging: ?No results found. ?

## 2022-01-29 MED ORDER — BUPIVACAINE HCL 0.25 % IJ SOLN
4.0000 mL | INTRAMUSCULAR | Status: AC | PRN
  Administered 2022-01-27: 4 mL via INTRA_ARTICULAR

## 2022-01-29 MED ORDER — TRIAMCINOLONE ACETONIDE 40 MG/ML IJ SUSP
40.0000 mg | INTRAMUSCULAR | Status: AC | PRN
  Administered 2022-01-27: 40 mg via INTRA_ARTICULAR

## 2022-02-11 ENCOUNTER — Other Ambulatory Visit: Payer: Self-pay | Admitting: Internal Medicine

## 2022-03-06 ENCOUNTER — Other Ambulatory Visit: Payer: Self-pay | Admitting: Interventional Cardiology

## 2022-03-06 DIAGNOSIS — I2609 Other pulmonary embolism with acute cor pulmonale: Secondary | ICD-10-CM

## 2022-03-07 NOTE — Telephone Encounter (Signed)
Xarelto '20mg'$  refill request received. Pt is 71 years old, weight-122kg, Crea-0.96 on 11/23/2021, last seen by Dr. [name] on 08/26/2021, Diagnosis-PE, CrCl-106.49m/min; Dose is appropriate based on dosing criteria. Will send in refill to requested pharmacy.    ?

## 2022-03-08 ENCOUNTER — Other Ambulatory Visit: Payer: Self-pay | Admitting: Internal Medicine

## 2022-03-17 ENCOUNTER — Other Ambulatory Visit: Payer: Self-pay | Admitting: Internal Medicine

## 2022-04-13 ENCOUNTER — Ambulatory Visit
Admission: RE | Admit: 2022-04-13 | Discharge: 2022-04-13 | Disposition: A | Discharge status: Home / Self Care | Payer: Medicare Other | Source: Ambulatory Visit | Attending: Internal Medicine | Admitting: Internal Medicine

## 2022-04-13 DIAGNOSIS — E8940 Asymptomatic postprocedural ovarian failure: Secondary | ICD-10-CM

## 2022-04-13 DIAGNOSIS — Z1231 Encounter for screening mammogram for malignant neoplasm of breast: Secondary | ICD-10-CM | POA: Diagnosis not present

## 2022-04-13 DIAGNOSIS — M85851 Other specified disorders of bone density and structure, right thigh: Secondary | ICD-10-CM | POA: Diagnosis not present

## 2022-04-13 DIAGNOSIS — Z780 Asymptomatic menopausal state: Secondary | ICD-10-CM | POA: Diagnosis not present

## 2022-04-18 ENCOUNTER — Telehealth (INDEPENDENT_AMBULATORY_CARE_PROVIDER_SITE_OTHER): Payer: Medicare Other | Admitting: Internal Medicine

## 2022-04-18 ENCOUNTER — Encounter: Payer: Self-pay | Admitting: Internal Medicine

## 2022-04-18 DIAGNOSIS — Z86711 Personal history of pulmonary embolism: Secondary | ICD-10-CM

## 2022-04-18 DIAGNOSIS — M85851 Other specified disorders of bone density and structure, right thigh: Secondary | ICD-10-CM

## 2022-04-18 DIAGNOSIS — M1712 Unilateral primary osteoarthritis, left knee: Secondary | ICD-10-CM | POA: Diagnosis not present

## 2022-04-18 DIAGNOSIS — Z7901 Long term (current) use of anticoagulants: Secondary | ICD-10-CM | POA: Diagnosis not present

## 2022-04-18 NOTE — Progress Notes (Signed)
   Subjective:    Patient ID: [name], female    DOB: April 15, 1951, 71 y.o.   MRN: [id number]  HPI 71 year old Female seen today via interactive audio and video telecommunications.  Patient is at her home in [place] and I am at my office.  She is identified using 2 identifiers as [name]. [name], a longstanding patient in this practice.  She is agreeable to visit in this format today.  The reason for the visit is to discuss recent bone density study.  Patient has some issues ambulating due to osteoarthritis of both knees and is at risk for fall.  She has a history of morbid obesity and is on chronic Xarelto therapy after having an acute saddle pulmonary embolism with acute cor pulmonale in March 2018.  Other medical issues include hypothyroidism, GE reflux, insomnia, depression, hypokalemia, migraine headaches, asthma, history of B12 deficiency and allergic rhinitis due to dust mites and mold.  History of right shoulder arthroscopic surgery acromioplasty and rotator cuff repair January 2016.  She saw Dr. Erlinda Hong in March 2023 for primary osteoarthritis of left knee and was placed on prednisone and Ultram.  Subsequently had knee injected by Dr. Ernestina Patches with triamcinolone 40 mg, bupivacaine 0.25% 4 cc.  Patient says this did not help very much.  I have suggested she get 1 more injection to see if this will be of some help.  In my opinion she is at risk for falling due to gait instability and a fall could be devastating due to chronic anticoagulation.  Recent bone density study showed osteopenia with T score in the right femoral neck -2.4.  We discussed various treatments for osteopenia including Prolia and Boniva.  Prolia would be an injection every 6 months and Boniva would be a tablet taken on an empty stomach once a month.  Patient and I discussed pros and cons of these 2.  She really does not want to take additional medication at this point in time.  She says she is careful  ambulating with her walker and does not leave the house very much.  Review of Systems see above     Objective:   Physical Exam  She is seen virtually at her home.  She is seated at the present time.  Currently seated in no acute distress.  Ambulates with a walker.      Assessment & Plan:  Osteopenia with recent T score on bone density study -2.4 which was done on Apr 13, 2022.  This was in the right femoral neck and was the lowest T score in the study.  She does take some over-the-counter vitamin D but it is a low dose.  We will plan to check this at upcoming lab appointment this summer.  History of pulmonary embolism and is on chronic anticoagulation  Osteoarthritis of left knee-has failed fluoroscopy injection x1  Morbid obesity  Plan: We discussed Prolia and Boniva and she would like not to take therapy for bone density at this point in time.  She has follow-up here in July.  We will check vitamin D level at that time.  She currently is taking low-dose vitamin D over-the-counter.  Regarding osteoarthritis of her knee I suggested she return and have Dr. Ernestina Patches do an additional injection to see if it will be of help.

## 2022-04-18 NOTE — Patient Instructions (Signed)
She has an appointment here in early July and will have vitamin D level drawn with fasting labs at that time.  Patient declines to take Boniva or Prolia at this point in time for osteopenia.

## 2022-04-21 ENCOUNTER — Telehealth: Payer: Self-pay | Admitting: Internal Medicine

## 2022-04-21 NOTE — Telephone Encounter (Signed)
LVM to CB and reschedule lab appointment for 05/19/2022 to week before or Monday after.

## 2022-04-25 NOTE — Telephone Encounter (Signed)
Rescheduled labs

## 2022-05-19 ENCOUNTER — Other Ambulatory Visit: Payer: PRIVATE HEALTH INSURANCE

## 2022-05-20 ENCOUNTER — Other Ambulatory Visit: Payer: Medicare Other

## 2022-05-20 DIAGNOSIS — E1169 Type 2 diabetes mellitus with other specified complication: Secondary | ICD-10-CM | POA: Diagnosis not present

## 2022-05-20 DIAGNOSIS — M85851 Other specified disorders of bone density and structure, right thigh: Secondary | ICD-10-CM | POA: Diagnosis not present

## 2022-05-20 DIAGNOSIS — E1100 Type 2 diabetes mellitus with hyperosmolarity without nonketotic hyperglycemic-hyperosmolar coma (NKHHC): Secondary | ICD-10-CM | POA: Diagnosis not present

## 2022-05-20 DIAGNOSIS — E785 Hyperlipidemia, unspecified: Secondary | ICD-10-CM | POA: Diagnosis not present

## 2022-05-20 DIAGNOSIS — M8580 Other specified disorders of bone density and structure, unspecified site: Secondary | ICD-10-CM

## 2022-05-21 LAB — HEMOGLOBIN A1C
Hgb A1c MFr Bld: 5.6 % of total Hgb (ref <5.7)
Mean Plasma Glucose: 114 mg/dL
eAG (mmol/L): 6.3 mmol/L

## 2022-05-21 LAB — LIPID PANEL
Cholesterol: 102 mg/dL (ref <200)
HDL: 46 mg/dL — ABNORMAL LOW (ref >=50)
LDL Cholesterol (Calc): 38 mg/dL (calc)
Non-HDL Cholesterol (Calc): 56 mg/dL (calc) (ref <130)
Total CHOL/HDL Ratio: 2.2 (calc) (ref <5.0)
Triglycerides: 99 mg/dL (ref <150)

## 2022-05-21 LAB — HEPATIC FUNCTION PANEL
AG Ratio: 1.6 (calc) (ref 1.0–2.5)
ALT: 14 U/L (ref 6–29)
AST: 15 U/L (ref 10–35)
Albumin: 4.1 g/dL (ref 3.6–5.1)
Alkaline phosphatase (APISO): 76 U/L (ref 37–153)
Bilirubin, Direct: 0.1 mg/dL (ref 0.0–0.2)
Globulin: 2.6 g/dL (calc) (ref 1.9–3.7)
Indirect Bilirubin: 0.4 mg/dL (calc) (ref 0.2–1.2)
Total Bilirubin: 0.5 mg/dL (ref 0.2–1.2)
Total Protein: 6.7 g/dL (ref 6.1–8.1)

## 2022-05-21 LAB — VITAMIN D 25 HYDROXY (VIT D DEFICIENCY, FRACTURES): Vit D, 25-Hydroxy: 34 ng/mL (ref 30–100)

## 2022-05-24 ENCOUNTER — Encounter: Payer: Self-pay | Admitting: Internal Medicine

## 2022-05-24 ENCOUNTER — Ambulatory Visit (INDEPENDENT_AMBULATORY_CARE_PROVIDER_SITE_OTHER): Payer: Medicare Other | Admitting: Internal Medicine

## 2022-05-24 VITALS — BP 114/74 | HR 83 | Temp 98.3°F | Ht 63.75 in | Wt 287.8 lb

## 2022-05-24 DIAGNOSIS — F419 Anxiety disorder, unspecified: Secondary | ICD-10-CM | POA: Diagnosis not present

## 2022-05-24 DIAGNOSIS — Z6841 Body Mass Index (BMI) 40.0 and over, adult: Secondary | ICD-10-CM

## 2022-05-24 DIAGNOSIS — M1712 Unilateral primary osteoarthritis, left knee: Secondary | ICD-10-CM | POA: Diagnosis not present

## 2022-05-24 DIAGNOSIS — E1169 Type 2 diabetes mellitus with other specified complication: Secondary | ICD-10-CM

## 2022-05-24 DIAGNOSIS — Z8639 Personal history of other endocrine, nutritional and metabolic disease: Secondary | ICD-10-CM

## 2022-05-24 DIAGNOSIS — G4733 Obstructive sleep apnea (adult) (pediatric): Secondary | ICD-10-CM | POA: Diagnosis not present

## 2022-05-24 DIAGNOSIS — Z7901 Long term (current) use of anticoagulants: Secondary | ICD-10-CM

## 2022-05-24 DIAGNOSIS — Z23 Encounter for immunization: Secondary | ICD-10-CM | POA: Diagnosis not present

## 2022-05-24 DIAGNOSIS — M170 Bilateral primary osteoarthritis of knee: Secondary | ICD-10-CM

## 2022-05-24 DIAGNOSIS — I10 Essential (primary) hypertension: Secondary | ICD-10-CM

## 2022-05-24 DIAGNOSIS — Z86711 Personal history of pulmonary embolism: Secondary | ICD-10-CM | POA: Diagnosis not present

## 2022-05-24 DIAGNOSIS — F32A Depression, unspecified: Secondary | ICD-10-CM

## 2022-05-24 DIAGNOSIS — E785 Hyperlipidemia, unspecified: Secondary | ICD-10-CM

## 2022-05-24 MED ORDER — ERGOCALCIFEROL 1.25 MG (50000 UT) PO CAPS: 50000.0000 [IU] | ORAL_CAPSULE | ORAL | 3 refills | Status: DC

## 2022-05-24 NOTE — Progress Notes (Unsigned)
   Subjective:    Patient ID: [name], female    DOB: 1951-08-09, 71 y.o.   MRN: [id number]  HPI Patient seen for 6 month follow up.Had bone density study in May and lowest T score -2.4. Patient does not want to be on bone sparing therapy at this point.  This has been discussed previously.  Pneumococcal 20 vaccine given today.  Feels that she is getting along pretty well.  Has chronic left knee pain from primary osteoarthritis that is injected from time to time by Dr. Ernestina Patches.  Has taken tramadol for pain and sometimes prednisone orally.  History of laparoscopic cholecystectomy by Dr. Ninfa Linden in April 2022.  History of morbid obesity, osteoarthritis of both knees.  History of saddle embolism in March 2018 with acute cor pulmonale treated with chronic Xarelto therapy.  History of lichen planus involving the mouth which has intermittent exacerbations.  History of hypothyroidism.  History of GE reflux, depression, insomnia, ulcerative colitis, hypokalemia, migraine headaches, asthma and history of B12 deficiency.  History of diabetes mellitus.  History of positive allergy skin tests to dust mites and mold.  Testing was done in 2009.  FEV1 improved with inhaled albuterol.  Dr. Pernell Dupre is her Cardiologist.  History of subcritical LAD disease with heart catheterization 2020.  Had 60 to 70% mid LAD stenosis.  Social history: She is divorced.  Does not smoke.  Occasional alcohol consumption.  Longstanding history of morbid obesity.  She retired as Forensic psychologist at [employer].  She resides in [place].  Her hemoglobin A1c is excellent at 5.6% and was 6% in December.  She has a low HDL of 46 otherwise lipids are normal.  Liver functions are normal.  Vitamin D level is 34.  Have prescribed high-dose vitamin D weekly for her today.  TSH was not checked with this visit.  It will be repeated at time of her health maintenance exam in 6 months.  She will  continue with thyroid replacement medication 200 mcg daily.  Review of Systems no new complaints other than some musculoskeletal pain.     Objective:   Physical Exam Vital signs reviewed.  BMI 49.78 ,blood pressure excellent at 114/74 pulse 83 pulse oximetry 96% on room air, Weight 287 pounds, 12 ounces  Skin: Warm and dry.  No cervical adenopathy.  No thyromegaly or carotid bruits.  Chest clear.  Cardiac exam regular rate and rhythm.  No lower extremity pitting edema.  Affect thought and judgment are normal.  Labs reviewed and are stable.  Pneumococcal 20 vaccine given.       Assessment & Plan:  Morbid obesity with BMI of 49.78  Primary osteoarthritis both knees-seen by orthopedist  Health maintenance-pneumococcal 20 vaccine given  Type 2 diabetes mellitus with stable hemoglobin A1c 5.6%  Hypothyroidism treated with thyroid replacement medication and stable  History of vitamin D deficiency-we will continue with high-dose vitamin D weekly  Chronic anticoagulation for history of PE  History of lichen planus  GE reflux treated with PPI  History of depression- stable  History of insomnia  History of ulcerative colitis  Coronary artery disease followed by Dr. Pernell Dupre.  History of 60 to 70% mid LAD  History of sleep apnea treated with CPAP  History of pulmonary embolus maintained on chronic anticoagulation

## 2022-05-25 NOTE — Patient Instructions (Addendum)
Pneumococcal 20 vaccine given.  Labs are stable.  Have prescribed high-dose vitamin D weekly.  Follow-up in 6 months for health maintenance exam and Medicare wellness visit.  Continue current medications.  Hypothyroidism treated with thyroid replacement medication and stable  Coronary disease appears to be stable  Continue with CPAP device

## 2022-06-04 ENCOUNTER — Other Ambulatory Visit: Payer: Self-pay | Admitting: Internal Medicine

## 2022-06-08 ENCOUNTER — Ambulatory Visit (INDEPENDENT_AMBULATORY_CARE_PROVIDER_SITE_OTHER): Payer: Medicare Other | Admitting: Podiatry

## 2022-06-08 DIAGNOSIS — B351 Tinea unguium: Secondary | ICD-10-CM | POA: Diagnosis not present

## 2022-06-08 DIAGNOSIS — M79675 Pain in left toe(s): Secondary | ICD-10-CM

## 2022-06-08 DIAGNOSIS — M79674 Pain in right toe(s): Secondary | ICD-10-CM

## 2022-06-08 NOTE — Progress Notes (Signed)
   SUBJECTIVE Patient presents to office today complaining of elongated, thickened nails that cause pain while ambulating in shoes.  Patient is unable to trim their own nails. Patient is here for further evaluation and treatment.  Past Medical History:  Diagnosis Date   Anemia    none recently   Anxiety    Arthritis    Asthma    Complete tear of right rotator cuff 12/05/2014   Coronary artery disease    Depression    Diabetes mellitus    Last A1C was 6.2 on 09/18/2020   Diverticulitis    Diverticulosis    Family history of adverse reaction to anesthesia    mom  nausea and vomiting   GERD (gastroesophageal reflux disease)    History of kidney stones    seen on scan, has not been a problem.   Hypertension    Hypothyroidism    Migraine    Personal history of kidney stones    Pneumonia    hx   Pulmonary embolism ([place])    a. diagnosed in 01/2017 w/ imaging showing multiple right lung PE with right heart strain. Started on Xarelto.    Shortness of breath dyspnea    exersion   Thyroid disease    hypo   Wears glasses     OBJECTIVE General Patient is awake, alert, and oriented x 3 and in no acute distress. Derm Skin is dry and supple bilateral. Negative open lesions or macerations. Remaining integument unremarkable. Nails are tender, long, thickened and dystrophic with subungual debris, consistent with onychomycosis, 1-5 bilateral. No signs of infection noted. Vasc  DP and PT pedal pulses palpable bilaterally. Temperature gradient within normal limits.  Neuro Epicritic and protective threshold sensation grossly intact bilaterally.  Musculoskeletal Exam No symptomatic pedal deformities noted bilateral. Muscular strength within normal limits.  ASSESSMENT 1.  Pain due to onychomycosis of toenails both  PLAN OF CARE 1. Patient evaluated today.  2. Instructed to maintain good pedal hygiene and foot care.  3. Mechanical debridement of nails 1-5 bilaterally performed using a nail  nipper. Filed with dremel without incident.  4.  OTC Tolcylen antifungal topical dispensed at checkout  5.  Return to clinic as needed   Edrick Kins, DPM [facility]  Dr. Edrick Kins, DPM    [address], [place] [address]                Office [phone]  Fax [phone]

## 2022-07-06 ENCOUNTER — Other Ambulatory Visit: Payer: Self-pay | Admitting: Internal Medicine

## 2022-07-06 ENCOUNTER — Other Ambulatory Visit: Payer: Self-pay | Admitting: Interventional Cardiology

## 2022-07-06 DIAGNOSIS — I2609 Other pulmonary embolism with acute cor pulmonale: Secondary | ICD-10-CM

## 2022-07-06 NOTE — Telephone Encounter (Signed)
Prescription refill request for Xarelto received.  Indication: PE Last office visit:01/19/21 (Bhagat)  Weight: 130.5kg Age: 71 Scr: 0.96 (11/23/21)  CrCl: 113.6m/min  Appropriate dose and refill sent to requested pharmacy.

## 2022-07-12 ENCOUNTER — Encounter: Payer: Self-pay | Admitting: Internal Medicine

## 2022-07-12 DIAGNOSIS — Z961 Presence of intraocular lens: Secondary | ICD-10-CM | POA: Diagnosis not present

## 2022-07-12 DIAGNOSIS — Z0289 Encounter for other administrative examinations: Secondary | ICD-10-CM

## 2022-07-12 DIAGNOSIS — H18513 Endothelial corneal dystrophy, bilateral: Secondary | ICD-10-CM | POA: Diagnosis not present

## 2022-07-12 DIAGNOSIS — H04123 Dry eye syndrome of bilateral lacrimal glands: Secondary | ICD-10-CM | POA: Diagnosis not present

## 2022-07-12 DIAGNOSIS — H43813 Vitreous degeneration, bilateral: Secondary | ICD-10-CM | POA: Diagnosis not present

## 2022-07-12 DIAGNOSIS — H35371 Puckering of macula, right eye: Secondary | ICD-10-CM | POA: Diagnosis not present

## 2022-07-12 DIAGNOSIS — H353121 Nonexudative age-related macular degeneration, left eye, early dry stage: Secondary | ICD-10-CM | POA: Diagnosis not present

## 2022-07-12 DIAGNOSIS — R7309 Other abnormal glucose: Secondary | ICD-10-CM | POA: Diagnosis not present

## 2022-07-12 DIAGNOSIS — D3131 Benign neoplasm of right choroid: Secondary | ICD-10-CM | POA: Diagnosis not present

## 2022-07-17 ENCOUNTER — Other Ambulatory Visit: Payer: Self-pay | Admitting: Physician Assistant

## 2022-07-17 ENCOUNTER — Other Ambulatory Visit: Payer: Self-pay | Admitting: Internal Medicine

## 2022-07-29 ENCOUNTER — Encounter: Payer: Self-pay | Admitting: Internal Medicine

## 2022-08-26 ENCOUNTER — Telehealth: Payer: Self-pay | Admitting: Internal Medicine

## 2022-08-26 NOTE — Telephone Encounter (Signed)
[name] [phone]  [name] called to say she has Chronic cough, shortness of breath. She stated she had it some the last time she was here, (05/24/22) but it is getting progressively worse. The shortness of breath is when she is sitting, walking or laying, seems to be all the time. Just does not feel good, No fever, or any other symptoms.

## 2022-08-28 NOTE — Progress Notes (Unsigned)
Office Visit    Patient Name: [name] Date of Encounter: 08/29/2022  Primary Care Provider:  Elby Showers, MD Primary Cardiologist:  Sinclair Grooms, MD Primary Electrophysiologist: None  Chief Complaint    [name] is a 71 y.o. female with PMH of CAD s/p LHC 2020 with mid LAD stenosis of 60-70%, DM type II, HTN, hypothyroid, GERD, HTN, pulmonary embolism (01/2017 on Xarelto), OSA (on CPAP), pulmonary HTN, diastolic CHF, who presents today for follow-up of coronary artery disease.  Past Medical History    Past Medical History:  Diagnosis Date   Anemia    none recently   Anxiety    Arthritis    Asthma    Complete tear of right rotator cuff 12/05/2014   Coronary artery disease    Depression    Diabetes mellitus    Last A1C was 6.2 on 09/18/2020   Diverticulitis    Diverticulosis    Family history of adverse reaction to anesthesia    mom  nausea and vomiting   GERD (gastroesophageal reflux disease)    History of kidney stones    seen on scan, has not been a problem.   Hypertension    Hypothyroidism    Migraine    Personal history of kidney stones    Pneumonia    hx   Pulmonary embolism ([place])    a. diagnosed in 01/2017 w/ imaging showing multiple right lung PE with right heart strain. Started on Xarelto.    Shortness of breath dyspnea    exersion   Thyroid disease    hypo   Wears glasses    Past Surgical History:  Procedure Laterality Date   ABDOMINAL HYSTERECTOMY     CATARACT EXTRACTION, BILATERAL     CESAREAN SECTION     CHOLECYSTECTOMY N/A 02/16/2021   Procedure: LAPAROSCOPIC CHOLECYSTECTOMY;  Surgeon: Coralie Keens, MD;  Location: [facility];  Service: General;  Laterality: N/A;   COLONOSCOPY     multiple    ESOPHAGOGASTRODUODENOSCOPY ENDOSCOPY     multiple   INTRAVASCULAR PRESSURE WIRE/FFR STUDY N/A 01/14/2019   Procedure: INTRAVASCULAR PRESSURE WIRE/FFR STUDY;  Surgeon: Belva Crome, MD;  Location: [facility];   Service: Cardiovascular;  Laterality: N/A;   KNEE CARTILAGE SURGERY     Left   LAPAROSCOPIC PARTIAL COLECTOMY N/A 04/22/2013   Procedure: LAPAROSCOPIC PARTIAL COLECTOMY;  Surgeon: Odis Hollingshead, MD;  Location: [facility];  Service: General;  Laterality: N/A;   LEFT HEART CATH AND CORONARY ANGIOGRAPHY N/A 01/14/2019   Procedure: LEFT HEART CATH AND CORONARY ANGIOGRAPHY;  Surgeon: Belva Crome, MD;  Location: [facility];  Service: Cardiovascular;  Laterality: N/A;   right foot surgery  little toe and next toe   x 3   SHOULDER ARTHROSCOPY WITH ROTATOR CUFF REPAIR AND SUBACROMIAL DECOMPRESSION Right 12/05/2014   Procedure: RIGHT SHOULDER ARTHROSCOPY,ACROMOPLASTY, ROTATOR CUFF REPAIR;  Surgeon: [name], MD;  Location: [facility];  Service: Orthopedics;  Laterality: Right;    Allergies  Allergies  Allergen Reactions   Aspirin Shortness Of Breath   Lisinopril Anaphylaxis and Other (See Comments)    Possibly Lisinopril or Levaquin, pt was taking both at the time of reaction   Bee Venom Swelling   Morphine And Related Itching and Other (See Comments)    Needs benadryl prior to     History of Present Illness    [name]  is a 71 year old female  with the above mention past medical history who presents today for follow-up of coronary artery disease.  She was initially seen by Dr. [name] in 2017 for surgical clearance.  She was subsequently seen in 2018 for shortness of breath.  She was seen in follow-up in 12/2016 for complaint of dyspnea and lower extremity swelling.  She was sent for Myoview that showed no evidence of ischemia and was low risk.  2D echo was also completed that showed EF of 60 to 65% with moderately dilated RV EF and severe pulmonary hypertension suspicious for possible PE.  Cardiac CTA was completed based on previous findings that showed multiple right lung PEs with evidence of right heart strain. She was admitted from office by Dr. [name] on  01/2017 due to high clot burden and to assess for DVTs.  Patient was placed on heparin drip and started on Xarelto.  She was seen 01/2019 for complaint of chest pain with work-up in the ED that showed negative troponin and D-dimer of 0.69.  EKG showed no acute changes.  She described pain as substernal left-sided that lasted for 3 days.  She underwent LHC that revealed normal LV function with EF of 65 and LAD mid lesion 40-60% stenosis, with 0.91 DFR and 0.84 FFR.  Medical therapy recommended.  She was last seen by Dr. [name] on 08/2021 for follow-up.  During her visit she was doing well with exception of COVID-19 hospitalization that occurred 6 weeks prior.  She denied any anginal complaints and blood pressure was excellently controlled.  Consideration should be made for patient to start SGLT2 based on DM and fluid retention issues.  She was also encouraged to wear CPAP but refuses.  Ms. Huesman presents today with her sister for annual follow-up.  Since last being seen in the office patient reports she has been doing okay from the cardiac perspective but does endorse shortness of breath and cough.  She is euvolemic on examination with chronic lower extremity edema.  She is compliant with her current medications and denies any adverse reactions currently.  We discussed the neck step in treatment possible referral to pulmonology due to history of pulmonary hypertension.  Patient denies chest pain, palpitations, dyspnea, PND, orthopnea, nausea, vomiting, dizziness, syncope, edema, weight gain, or early satiety.  Home Medications    Current Outpatient Medications  Medication Sig Dispense Refill   acetaminophen (TYLENOL) 500 MG tablet Take 500 mg by mouth every 6 (six) hours as needed.     albuterol (VENTOLIN HFA) 108 (90 Base) MCG/ACT inhaler INHALE 2 PUFFS INTO THE LUNGS EVERY 6 (SIX) HOURS AS NEEDED FOR WHEEZING OR SHORTNESS OF BREATH 18 each 11   ALPRAZolam (XANAX) 0.5 MG tablet TAKE 1 TABLET BY MOUTH  TWICE A DAY AS NEEDED 180 tablet 0   atorvastatin (LIPITOR) 80 MG tablet Take 1 tablet (80 mg total) by mouth daily at 6 PM. 90 tablet 0   cetirizine (ZYRTEC) 10 MG tablet Take 10 mg by mouth at bedtime.     Cholecalciferol (VITAMIN D3) 5000 units CAPS Take 5,000 Units by mouth daily.     clobetasol ointment (TEMOVATE) 3.38 % Apply 1 application topically 2 (two) times daily as needed (skin irritation).     EPINEPHrine 0.3 mg/0.3 mL IJ SOAJ injection Inject 0.3 mLs (0.3 mg total) into the muscle as needed for anaphylaxis. 1 each 11   ergocalciferol (DRISDOL) 1.25 MG (50000 UT) capsule Take 1 capsule (50,000 Units total) by mouth once a week. 12 capsule 3  escitalopram (LEXAPRO) 10 MG tablet TAKE 1 TABLET BY MOUTH EVERY DAY 90 tablet 3   famotidine (PEPCID) 10 MG tablet Take 10 mg by mouth at bedtime.     furosemide (LASIX) 40 MG tablet TAKE 1 TABLET BY MOUTH EVERY DAY 90 tablet 3   levothyroxine (SYNTHROID) 200 MCG tablet TAKE 1 TABLET BY MOUTH EVERY DAY ON EMPTY STOMACH BEFORE BREAKFAST 90 tablet 1   magnesium oxide (MAG-OX) 400 MG tablet Take 1 tablet by mouth on Tuesdays, Thursdays, Saturdays, & Sundays.  Take 1 tablet by mouth twice a day on Mondays, Wednesdays, & Fridays (Patient taking differently: Take 400 mg by mouth See admin instructions. Take 400 mg by mouth once daily on Tuesdays, Thursdays, Saturdays, & Sundays and take 400 mg twice a day on Mondays, Wednesdays, & Fridays) 60 tablet 11   metoprolol tartrate (LOPRESSOR) 25 MG tablet TAKE 1 TABLET BY MOUTH EVERY DAY IN THE MORNING 90 tablet 1   montelukast (SINGULAIR) 10 MG tablet TAKE 1 TABLET BY MOUTH EVERYDAY AT BEDTIME 90 tablet 3   Multiple Vitamins-Minerals (PRESERVISION AREDS 2 PO) Take 1 capsule by mouth daily.     olmesartan (BENICAR) 40 MG tablet TAKE 1 TABLET BY MOUTH EVERY DAY 90 tablet 3   traMADol (ULTRAM) 50 MG tablet Take 1 tablet (50 mg total) by mouth every 12 (twelve) hours as needed. 30 tablet 2   traZODone (DESYREL)  150 MG tablet TAKE 1 TABLET BY MOUTH EVERYDAY AT BEDTIME 90 tablet 1   XARELTO 20 MG TABS tablet TAKE 1 TABLET BY MOUTH DAILY WITH SUPPER 90 tablet 1   zolpidem (AMBIEN) 10 MG tablet TAKE 1 TABLET BY MOUTH EVERY DAY AT BEDTIME AS NEEDED 90 tablet 1   nitroGLYCERIN (NITROSTAT) 0.4 MG SL tablet Place 1 tablet (0.4 mg total) under the tongue every 5 (five) minutes as needed for chest pain. 100 tablet 3   No current facility-administered medications for this visit.     Review of Systems  Please see the history of present illness.    (+) Shortness of breath (+) Cough  All other systems reviewed and are otherwise negative except as noted above.  Physical Exam    Wt Readings from Last 3 Encounters:  08/29/22 296 lb (134.3 kg)  05/24/22 287 lb 12 oz (130.5 kg)  11/16/21 269 lb (122 kg)   VS: Vitals:   08/29/22 1514  BP: (!) 118/58  Pulse: (!) 56  SpO2: 92%  ,Body mass index is 51.21 kg/m.  Constitutional:      Appearance: Healthy appearance. Not in distress.  Neck:     Vascular: JVD normal.  Pulmonary:     Effort: Pulmonary effort is normal.     Breath sounds: No wheezing. No rales. Diminished in the bases Cardiovascular:     Normal rate. Regular rhythm. Normal S1. Normal S2.      Murmurs: There is no murmur.  Edema:    Periphera edema chronic +1 in lower extremities Abdominal:     Palpations: Abdomen is soft non tender. There is no hepatomegaly.  Skin:    General: Skin is warm and dry.  Neurological:     General: No focal deficit present.     Mental Status: Alert and oriented to person, place and time.     Cranial Nerves: Cranial nerves are intact.  EKG/LABS/Other Studies Reviewed    ECG personally reviewed by me today -none completed today   Lab Results  Component Value Date   CREATININE 0.96  11/23/2021   BUN 19 11/23/2021   NA 139 11/23/2021   K 4.5 11/23/2021   CL 99 11/23/2021   CO2 34 (H) 11/23/2021   Lab Results  Component Value Date   ALT 14  05/20/2022   AST 15 05/20/2022   ALKPHOS 89 05/08/2019   BILITOT 0.5 05/20/2022   Lab Results  Component Value Date   CHOL 102 05/20/2022   HDL 46 (L) 05/20/2022   LDLCALC 38 05/20/2022   TRIG 99 05/20/2022   CHOLHDL 2.2 05/20/2022    Lab Results  Component Value Date   HGBA1C 5.6 05/20/2022    Assessment & Plan    1.  Coronary artery disease: -Patient had LHC completed 2020 with normal LV function with EF of 65 and LAD mid lesion 40-60% stenosis, otherwise normal coronaries -Today patient reports no chest pain or anginal equivalent -Continue GDMT with atorvastatin 80 mg, patient currently not on ASA due to Xarelto    2.  Essential hypertension: -Patient's blood pressure today was well controlled at 118/58 -Continue olmesartan 40 mg, metoprolol 25 mg daily,   3.  Hyperlipidemia: -Patient's last LDL was 38 -Continue atorvastatin 80 mg  4.  HFpEF: -EF of 60-65%. The cavity size was normal. Left ventricular diastolic parameters were normal. -Today patient is euvolemic on exam however endorses chronic coughing with shortness of breath -We will discontinue Lasix today and start torsemide 20 mg daily and patient will take 20 mg twice daily x3 days -BMET and magnesium in 1 week -I discussed adding SGLT2 however politely declines at this time -Patient advised that if cough continues to occur we will refer to pulmonology due to history of pulmonary hypertension  5.  History of DVT: -large-volume right-sided pulmonary emboli in March 2018 on lifelong anticoagulation per oncologist -Continue Xarelto 20 mg -dose appropriate based on creatinine clearance of 116    Disposition: Follow-up with [name], MD or APP in 1 month   Medication Adjustments/Labs and Tests Ordered: Current medicines are reviewed at length with the patient today.  Concerns regarding medicines are outlined above.   Signed, Mable Fill, Marissa Nestle, NP 08/29/2022, 3:41 PM [facility]  Note:  This document was prepared using Dragon voice recognition software and may include unintentional dictation errors.

## 2022-08-29 ENCOUNTER — Ambulatory Visit: Payer: Medicare Other | Attending: Nurse Practitioner | Admitting: Nurse Practitioner

## 2022-08-29 ENCOUNTER — Encounter: Payer: Self-pay | Admitting: Nurse Practitioner

## 2022-08-29 ENCOUNTER — Ambulatory Visit
Admission: RE | Admit: 2022-08-29 | Discharge: 2022-08-29 | Disposition: A | Discharge status: Home / Self Care | Payer: Medicare Other | Source: Ambulatory Visit | Attending: Nurse Practitioner | Admitting: Nurse Practitioner

## 2022-08-29 VITALS — BP 118/58 | HR 56 | Ht 63.75 in | Wt 296.0 lb

## 2022-08-29 DIAGNOSIS — I509 Heart failure, unspecified: Secondary | ICD-10-CM

## 2022-08-29 DIAGNOSIS — Z86718 Personal history of other venous thrombosis and embolism: Secondary | ICD-10-CM | POA: Diagnosis not present

## 2022-08-29 DIAGNOSIS — I2609 Other pulmonary embolism with acute cor pulmonale: Secondary | ICD-10-CM | POA: Diagnosis not present

## 2022-08-29 DIAGNOSIS — I2510 Atherosclerotic heart disease of native coronary artery without angina pectoris: Secondary | ICD-10-CM

## 2022-08-29 DIAGNOSIS — R059 Cough, unspecified: Secondary | ICD-10-CM | POA: Diagnosis not present

## 2022-08-29 DIAGNOSIS — I10 Essential (primary) hypertension: Secondary | ICD-10-CM | POA: Diagnosis not present

## 2022-08-29 DIAGNOSIS — R0602 Shortness of breath: Secondary | ICD-10-CM | POA: Diagnosis not present

## 2022-08-29 MED ORDER — TORSEMIDE 20 MG PO TABS: 20.0000 mg | ORAL_TABLET | Freq: Every day | ORAL | 3 refills | Status: DC

## 2022-08-29 MED ORDER — TORSEMIDE 20 MG PO TABS
20.0000 mg | ORAL_TABLET | Freq: Two times a day (BID) | ORAL | 3 refills | Status: DC
Start: 2022-08-29 — End: 2022-08-29

## 2022-08-29 NOTE — Patient Instructions (Addendum)
Medication Instructions:  Your physician has recommended you make the following change in your medication:   Stop taking lasix  Start taking Torsemide. Take one tablet 2 times daily for 3 days then one tablet daily  *If you need a refill on your cardiac medications before your next appointment, please call your pharmacy*   Lab Work: [facility]  If you have labs (blood work) drawn today and your tests are completely normal, you will receive your results only by: [place] (if you have MyChart) OR A paper copy in the mail If you have any lab test that is abnormal or we need to change your treatment, we will call you to review the results.   Testing/Procedures: Your provider recommends you have a CHEST X-RAY. Please proceed to [place] at [facility]: Address: [address] Phone: [phone] You do not need an appointment. Business hours are M-F 8:00AM to 5:00PM. There are no restrictions for this test.     Follow-Up: At [facility], you and your health needs are our priority.  As part of our continuing mission to provide you with exceptional heart care, we have created designated Provider Care Teams.  These Care Teams include your primary Cardiologist (physician) and Advanced Practice Providers (APPs -  Physician Assistants and Nurse Practitioners) who all work together to provide you with the care you need, when you need it.   Your next appointment:   1 month(s)  The format for your next appointment:   In Person  Provider:    Ambrose Pancoast, NP

## 2022-08-29 NOTE — Telephone Encounter (Signed)
[name] called me back to let me know that her and cardiology came up with a good game plan to figure out what is going on. Chest Xry, labs and new medication and follow up in a month. Let Dr Renold Genta know and she agrees, good plan.

## 2022-08-30 ENCOUNTER — Telehealth: Payer: Self-pay | Admitting: Nurse Practitioner

## 2022-08-30 MED ORDER — TORSEMIDE 20 MG PO TABS: 20.0000 mg | ORAL_TABLET | Freq: Two times a day (BID) | ORAL | 3 refills | Status: DC

## 2022-08-30 MED ORDER — POTASSIUM CHLORIDE CRYS ER 20 MEQ PO TBCR: 20.0000 meq | EXTENDED_RELEASE_TABLET | Freq: Two times a day (BID) | ORAL | 3 refills | Status: DC

## 2022-08-30 NOTE — Telephone Encounter (Signed)
Pt called back and educated on how to take torsemide, and potassium as ordered by Ambrose Pancoast, NP.     Pt advised / understood that Lasix was discontinued, and to NOT take with torsemide.    Pt will begin meds, and f/u with BMET and Mag lab draw at [address] in 1 week.

## 2022-08-30 NOTE — Telephone Encounter (Signed)
Patient called and mentioned that [name] was supposed to send in a prescription for potassium yesterday and has not received it yet

## 2022-08-30 NOTE — Telephone Encounter (Signed)
Orders received per Ambrose Pancoast, NP message, see below;   We can have her start potassium 20 mEq daily.  Please have her take 20 mEq twice daily for 3 days while taking increased dose of torsemide as discussed yesterday during visit.  Please let me know if have any additional questions   Thank you,,   Ambrose Pancoast, NP   Pt was also supposed to Take Torsemide 20 Mg twice daily for 3 days, then: take torsemide 20 mg daily.    ( Lasix has been discontinued...)  Medication orders entered in [place] to pharmacy.   Called patient to update / correct medication orders, left message to call back; follow up required.

## 2022-08-30 NOTE — Telephone Encounter (Signed)
Pt called back regarding potassium request from yesterdays visit.    Pt stated Ambrose Pancoast, NP would order potassium for her since she is starting torsemide, no medication at pharmacy.   No orders / notes seen in patients visit note from 10/16, unknown if NP wants patient to start potassium?     A message will be sent to Ambrose Pancoast, NP to verify patients request, and order potassium if provider wants this done.    Pt needs follow up if new potassium order is correct, or if no potassium is advised at this time.

## 2022-09-05 ENCOUNTER — Ambulatory Visit: Payer: Medicare Other | Attending: Cardiovascular Disease

## 2022-09-05 DIAGNOSIS — I5030 Unspecified diastolic (congestive) heart failure: Secondary | ICD-10-CM | POA: Diagnosis not present

## 2022-09-05 DIAGNOSIS — I2609 Other pulmonary embolism with acute cor pulmonale: Secondary | ICD-10-CM | POA: Diagnosis not present

## 2022-09-05 DIAGNOSIS — I509 Heart failure, unspecified: Secondary | ICD-10-CM | POA: Diagnosis not present

## 2022-09-05 DIAGNOSIS — I2510 Atherosclerotic heart disease of native coronary artery without angina pectoris: Secondary | ICD-10-CM

## 2022-09-05 LAB — BASIC METABOLIC PANEL
BUN/Creatinine Ratio: 16 (ref 12–28)
BUN: 30 mg/dL — ABNORMAL HIGH (ref 8–27)
CO2: 30 mmol/L — ABNORMAL HIGH (ref 20–29)
Calcium: 10.1 mg/dL (ref 8.7–10.3)
Chloride: 100 mmol/L (ref 96–106)
Creatinine, Ser: 1.89 mg/dL — ABNORMAL HIGH (ref 0.57–1.00)
Glucose: 130 mg/dL — ABNORMAL HIGH (ref 70–99)
Potassium: 5.3 mmol/L — ABNORMAL HIGH (ref 3.5–5.2)
Sodium: 142 mmol/L (ref 134–144)
eGFR: 28 mL/min/{1.73_m2} — ABNORMAL LOW (ref >59)

## 2022-09-05 LAB — MAGNESIUM: Magnesium: 2.3 mg/dL (ref 1.6–2.3)

## 2022-09-06 ENCOUNTER — Telehealth: Payer: Self-pay | Admitting: *Deleted

## 2022-09-06 DIAGNOSIS — R7989 Other specified abnormal findings of blood chemistry: Secondary | ICD-10-CM

## 2022-09-06 DIAGNOSIS — E875 Hyperkalemia: Secondary | ICD-10-CM

## 2022-09-06 DIAGNOSIS — I509 Heart failure, unspecified: Secondary | ICD-10-CM

## 2022-09-06 DIAGNOSIS — Z79899 Other long term (current) drug therapy: Secondary | ICD-10-CM

## 2022-09-06 MED ORDER — FUROSEMIDE 20 MG PO TABS: 20.0000 mg | ORAL_TABLET | Freq: Two times a day (BID) | ORAL | 2 refills | Status: DC

## 2022-09-06 MED ORDER — TORSEMIDE 20 MG PO TABS: 20.0000 mg | ORAL_TABLET | ORAL | 1 refills | Status: DC

## 2022-09-06 NOTE — Telephone Encounter (Signed)
-----   Message from Marylu Lund., NP sent at 09/05/2022  8:03 PM EDT ----- Please advise Ms. Taddeo that her renal function has increased since beginning torsemide.  Magnesium level was normal. Please advise patient to change torsemide to every other day.  Please recheck BMET in 1 week  Please stick to lower sodium diet (aiming for maximum 2,'000mg'$  per day) and staying hydrated but not to excess. In general about 64oz per day of all fluid intake would be an ideal maximum.    Ambrose Pancoast, NP

## 2022-09-06 NOTE — Telephone Encounter (Signed)
Pt advised of Ernest's recommendation. Pt agreeable to plan.  Aware to hold Potassium supplement until advised further after follow up blood work next week.  (Have not removed this, Potassium, from [facility])

## 2022-09-06 NOTE — Telephone Encounter (Signed)
Spoke to pt. Informed of advisement, pt agreeable to plan. Pt will stop by the office next Tuesday for follow up BMET.   She reports that she isn't sure this medication, Torsemide, is working as well as Lasix did.   Feels like she is not urinating as much on this. She also inquiring if she should continue a K+ supplement as K+ was elevated on result. Will forward to Jaquelyn Bitter to address both questions. Aware office will follow up once he reviews/advises. Patient verbalized understanding and agreeable to plan.

## 2022-09-09 DIAGNOSIS — Z23 Encounter for immunization: Secondary | ICD-10-CM | POA: Diagnosis not present

## 2022-09-13 ENCOUNTER — Ambulatory Visit: Payer: Medicare Other | Attending: Nurse Practitioner

## 2022-09-13 DIAGNOSIS — R7989 Other specified abnormal findings of blood chemistry: Secondary | ICD-10-CM

## 2022-09-13 DIAGNOSIS — Z79899 Other long term (current) drug therapy: Secondary | ICD-10-CM | POA: Diagnosis not present

## 2022-09-13 DIAGNOSIS — E875 Hyperkalemia: Secondary | ICD-10-CM | POA: Diagnosis not present

## 2022-09-13 DIAGNOSIS — I509 Heart failure, unspecified: Secondary | ICD-10-CM | POA: Diagnosis not present

## 2022-09-13 LAB — BASIC METABOLIC PANEL
BUN/Creatinine Ratio: 15 (ref 12–28)
BUN: 22 mg/dL (ref 8–27)
CO2: 27 mmol/L (ref 20–29)
Calcium: 9.3 mg/dL (ref 8.7–10.3)
Chloride: 100 mmol/L (ref 96–106)
Creatinine, Ser: 1.5 mg/dL — ABNORMAL HIGH (ref 0.57–1.00)
Glucose: 128 mg/dL — ABNORMAL HIGH (ref 70–99)
Potassium: 4.5 mmol/L (ref 3.5–5.2)
Sodium: 141 mmol/L (ref 134–144)
eGFR: 37 mL/min/{1.73_m2} — ABNORMAL LOW (ref >59)

## 2022-09-29 ENCOUNTER — Ambulatory Visit: Payer: Medicare Other | Admitting: Nurse Practitioner

## 2022-10-03 ENCOUNTER — Telehealth: Payer: Self-pay | Admitting: Internal Medicine

## 2022-10-03 NOTE — Telephone Encounter (Signed)
[name] [phone]  [name] called to say she is still having persistant cough and SOB, she did go to cardiology, and when she went there they told her if this persisted they thought she should follow up with pulmonary. I check in last note from Margarite Gouge, NP and he did say if cough persisted he would refer her to pulmonary for history of pulmonary hypertension. Do I need to have her call there for referral or put in referral and refer back to his note dated 08/29/2022?

## 2022-10-04 ENCOUNTER — Ambulatory Visit (INDEPENDENT_AMBULATORY_CARE_PROVIDER_SITE_OTHER): Payer: Medicare Other | Admitting: Internal Medicine

## 2022-10-04 ENCOUNTER — Other Ambulatory Visit: Payer: Medicare Other

## 2022-10-04 ENCOUNTER — Encounter: Payer: Self-pay | Admitting: Internal Medicine

## 2022-10-04 VITALS — BP 112/64 | HR 70 | Temp 98.9°F

## 2022-10-04 DIAGNOSIS — I5022 Chronic systolic (congestive) heart failure: Secondary | ICD-10-CM

## 2022-10-04 DIAGNOSIS — Z7901 Long term (current) use of anticoagulants: Secondary | ICD-10-CM

## 2022-10-04 DIAGNOSIS — J22 Unspecified acute lower respiratory infection: Secondary | ICD-10-CM

## 2022-10-04 DIAGNOSIS — E1100 Type 2 diabetes mellitus with hyperosmolarity without nonketotic hyperglycemic-hyperosmolar coma (NKHHC): Secondary | ICD-10-CM

## 2022-10-04 DIAGNOSIS — I200 Unstable angina: Secondary | ICD-10-CM | POA: Diagnosis not present

## 2022-10-04 DIAGNOSIS — E039 Hypothyroidism, unspecified: Secondary | ICD-10-CM

## 2022-10-04 DIAGNOSIS — Z8679 Personal history of other diseases of the circulatory system: Secondary | ICD-10-CM | POA: Diagnosis not present

## 2022-10-04 DIAGNOSIS — I2722 Pulmonary hypertension due to left heart disease: Secondary | ICD-10-CM | POA: Diagnosis not present

## 2022-10-04 DIAGNOSIS — F419 Anxiety disorder, unspecified: Secondary | ICD-10-CM

## 2022-10-04 DIAGNOSIS — F32A Depression, unspecified: Secondary | ICD-10-CM | POA: Diagnosis not present

## 2022-10-04 DIAGNOSIS — Z86711 Personal history of pulmonary embolism: Secondary | ICD-10-CM

## 2022-10-04 DIAGNOSIS — E1169 Type 2 diabetes mellitus with other specified complication: Secondary | ICD-10-CM

## 2022-10-04 DIAGNOSIS — I10 Essential (primary) hypertension: Secondary | ICD-10-CM | POA: Diagnosis not present

## 2022-10-04 DIAGNOSIS — G4733 Obstructive sleep apnea (adult) (pediatric): Secondary | ICD-10-CM | POA: Diagnosis not present

## 2022-10-04 DIAGNOSIS — R053 Chronic cough: Secondary | ICD-10-CM | POA: Diagnosis not present

## 2022-10-04 DIAGNOSIS — E785 Hyperlipidemia, unspecified: Secondary | ICD-10-CM

## 2022-10-04 MED ORDER — CEFTRIAXONE SODIUM 1 G IJ SOLR
1.0000 g | Freq: Once | INTRAMUSCULAR | Status: AC
  Administered 2022-10-04: 1 g via INTRAMUSCULAR

## 2022-10-04 MED ORDER — PREDNISONE 10 MG PO TABS: ORAL_TABLET | ORAL | 0 refills | Status: DC

## 2022-10-04 MED ORDER — BENZONATATE 100 MG PO CAPS: 100.0000 mg | ORAL_CAPSULE | Freq: Three times a day (TID) | ORAL | 0 refills | Status: DC | PRN

## 2022-10-04 MED ORDER — AZITHROMYCIN 250 MG PO TABS: ORAL_TABLET | ORAL | 0 refills | Status: AC

## 2022-10-04 NOTE — Patient Instructions (Signed)
Referral to Pulmonary if cough persists after treatment with antibiotics and steroids. We will try to get Ct of chest with contrast approved. Labs drawn and are pending.

## 2022-10-04 NOTE — Progress Notes (Signed)
Subjective:    Patient ID: [name], female    DOB: August 08, 1951, 71 y.o.   MRN: [id number]  HPI  Has had protracted cough for several weeks. Was seen by Cardiology in October and was switched to Torsemide but did not see much difference. CXR showed mild interstitial thickening. Decreased exercise tolerance over past few months. Difficulty vaccuming the floor. No hemoptysis.   Past medical history: She has a history of morbid obesity.  History of chronic Xarelto therapy after having an acute saddle pulmonary embolus with acute cor pulmonale in March 2018.  History of diverticulitis of the proximal descending colon status post laparoscopic assisted left colectomy June 2014.  History of positive allergy skin test to dust mites and mold in 2009.  FEV1 was shown to improve with inhaled albuterol.  History of GE reflux, depression, insomnia, ulcerative colitis, hypokalemia, migraine headaches, history of B12 deficiency.  History of hypothyroidism.  Admitted December 2007 with syncope.  Had herpes zoster July 2007.  Viral gastroenteritis April 2011 requiring hospitalization.  Surgery for chronic sinusitis November 2010.  History of gallstones and hepatic steatosis.  History of iron deficiency anemia that resolved after hysterectomy.  History of noncardiac and nonpulmonary chest pain in 2011 and at that time had negative cardiac enzymes, negative EKG and negative CT angio although her D-dimer was elevated.  She has been a patient in this practice in September 1996.  Dr. Pernell Dupre is her cardiologist.  History of some critical LAD disease with heart catheterization in 2020.  History of 60 to 70% mid LAD stenosis.  History of Diabetes mellitus.  She does not smoke.  Occasional alcohol consumption.  Social history: She  retired as Forensic psychologist at [employer].  She is divorced.  Occasional alcohol consumption.  Does not smoke.  Family history: Father deceased with  history of stroke, hypertension, diabetes and COPD.  Mother deceased with history of congestive heart failure chronic kidney disease.  1 sister and 1 son.   Review of Systems patient worried about Pulmonary hypertension. Says she is having difficulty getting a full deep breath. Denies chest pain     Objective:   Physical Exam  Blood pressure 112/64 pulse 70 temperature 98.9 degrees pulse oximetry 94%.  July 2023 pulse oximetry was 96%  Skin: Warm and dry.  No carotid bruits.  Chest clear.  Cardiac exam regular rate and rhythm without ectopy.  She looks a bit pale and and a bit fatigued.  She is tired of feeling poorly.  A respiratory virus panel was collected today.  She also had CBC with differential drawn, c-Met, hemoglobin A1c and BNP.    Assessment & Plan:   Acute lower respiratory infection.  She was given today a Zithromax Z-PAK and Tessalon Perles.  Given a tapering course of Rocephin as an outpatient.  Given 1 g IM Rocephin in office today.  Will order CT of chest with contrast due to her complaints of shortness of breath and fatigue.  I am concerned she may have an occult respiratory process.  Torsemide has not helped her symptoms she says.  Respiratory virus panel obtained.  History of  coronary disease with LAD stenosis 60 to 70%  Type 2 diabetes mellitus  Hypertension  Hypothyroidism  GE reflux  Hypertension  History of pulmonary embolus March 2017 treated with Xarelto  Obstructive sleep apnea treated with CPAP  History of pulmonary hypertension  History of diastolic congestive heart failure  Plan: Arrange for CT  of the chest with contrast as soon as possible..  Treat with Zithromax Z-PAK and Tessalon Perles.  Referral to Pulmonary.  History of atherosclerosis seen by cardiology

## 2022-10-05 LAB — CBC WITH DIFFERENTIAL/PLATELET
Absolute Monocytes: 837 cells/uL (ref 200–950)
Basophils Absolute: 64 cells/uL (ref 0–200)
Basophils Relative: 0.7 %
Eosinophils Absolute: 534 cells/uL — ABNORMAL HIGH (ref 15–500)
Eosinophils Relative: 5.8 %
HCT: 38.5 % (ref 35.0–45.0)
Hemoglobin: 12.5 g/dL (ref 11.7–15.5)
Lymphs Abs: 2944 cells/uL (ref 850–3900)
MCH: 28.4 pg (ref 27.0–33.0)
MCHC: 32.5 g/dL (ref 32.0–36.0)
MCV: 87.5 fL (ref 80.0–100.0)
MPV: 11 fL (ref 7.5–12.5)
Monocytes Relative: 9.1 %
Neutro Abs: 4821 cells/uL (ref 1500–7800)
Neutrophils Relative %: 52.4 %
Platelets: 259 10*3/uL (ref 140–400)
RBC: 4.4 10*6/uL (ref 3.80–5.10)
RDW: 12.2 % (ref 11.0–15.0)
Total Lymphocyte: 32 %
WBC: 9.2 10*3/uL (ref 3.8–10.8)

## 2022-10-05 LAB — COMPLETE METABOLIC PANEL WITH GFR
AG Ratio: 1.2 (calc) (ref 1.0–2.5)
ALT: 13 U/L (ref 6–29)
AST: 15 U/L (ref 10–35)
Albumin: 3.7 g/dL (ref 3.6–5.1)
Alkaline phosphatase (APISO): 82 U/L (ref 37–153)
BUN/Creatinine Ratio: 18 (calc) (ref 6–22)
BUN: 26 mg/dL — ABNORMAL HIGH (ref 7–25)
CO2: 33 mmol/L — ABNORMAL HIGH (ref 20–32)
Calcium: 9.4 mg/dL (ref 8.6–10.4)
Chloride: 102 mmol/L (ref 98–110)
Creat: 1.45 mg/dL — ABNORMAL HIGH (ref 0.60–1.00)
Globulin: 3 g/dL (calc) (ref 1.9–3.7)
Glucose, Bld: 138 mg/dL (ref 65–139)
Potassium: 4.2 mmol/L (ref 3.5–5.3)
Sodium: 142 mmol/L (ref 135–146)
Total Bilirubin: 0.3 mg/dL (ref 0.2–1.2)
Total Protein: 6.7 g/dL (ref 6.1–8.1)
eGFR: 39 mL/min/{1.73_m2} — ABNORMAL LOW (ref >=60)

## 2022-10-05 LAB — BRAIN NATRIURETIC PEPTIDE: Brain Natriuretic Peptide: 29 pg/mL (ref <100)

## 2022-10-05 LAB — HEMOGLOBIN A1C
Hgb A1c MFr Bld: 6.3 % of total Hgb — ABNORMAL HIGH (ref <5.7)
Mean Plasma Glucose: 134 mg/dL
eAG (mmol/L): 7.4 mmol/L

## 2022-10-06 LAB — RESPIRATORY VIRUS PANEL
Adenovirus B: NOT DETECTED
HUMAN PARAINFLU VIRUS 1: NOT DETECTED
HUMAN PARAINFLU VIRUS 2: NOT DETECTED
HUMAN PARAINFLU VIRUS 3: NOT DETECTED
INFLUENZA A SUBTYPE H1: NOT DETECTED
INFLUENZA A SUBTYPE H3: NOT DETECTED
Influenza A: NOT DETECTED
Influenza B: NOT DETECTED
Metapneumovirus: NOT DETECTED
Respiratory Syncytial Virus A: NOT DETECTED
Respiratory Syncytial Virus B: NOT DETECTED
Rhinovirus: DETECTED — AB

## 2022-10-11 ENCOUNTER — Ambulatory Visit (INDEPENDENT_AMBULATORY_CARE_PROVIDER_SITE_OTHER): Payer: Medicare Other | Admitting: Primary Care

## 2022-10-11 ENCOUNTER — Encounter: Payer: Self-pay | Admitting: Primary Care

## 2022-10-11 VITALS — BP 116/64 | HR 61 | Temp 98.6°F | Ht 63.5 in | Wt 291.2 lb

## 2022-10-11 DIAGNOSIS — I5030 Unspecified diastolic (congestive) heart failure: Secondary | ICD-10-CM | POA: Diagnosis not present

## 2022-10-11 DIAGNOSIS — R0602 Shortness of breath: Secondary | ICD-10-CM

## 2022-10-11 DIAGNOSIS — I2729 Other secondary pulmonary hypertension: Secondary | ICD-10-CM | POA: Diagnosis not present

## 2022-10-11 DIAGNOSIS — J45909 Unspecified asthma, uncomplicated: Secondary | ICD-10-CM

## 2022-10-11 DIAGNOSIS — I2609 Other pulmonary embolism with acute cor pulmonale: Secondary | ICD-10-CM

## 2022-10-11 DIAGNOSIS — I270 Primary pulmonary hypertension: Secondary | ICD-10-CM | POA: Diagnosis not present

## 2022-10-11 DIAGNOSIS — G4733 Obstructive sleep apnea (adult) (pediatric): Secondary | ICD-10-CM

## 2022-10-11 NOTE — Progress Notes (Signed)
$'@Patient'r$  ID: [name], female    DOB: 12-09-1950, 71 y.o.   MRN: [id number]  Chief Complaint  Patient presents with   Follow-up    Referring provider: Elby Showers, MD  HPI: 71 year old female, former smoker quit 1984 (3 pack year hx) PMH significant for asthma, acute saddle pulmonary embolism and DVT in March 2018, pulmonary hypertension Patient of Dr. Halford Chessman, last seen by him on 10/04/19.   HST 11/19/18 showed an AHI 14.6, SpO2 low 73%  Previous [facility] pulmonary encounters: 11/27/2018 Patient presents today to review home sleep test. HST showed mild-moderate obstructive sleep apnea and low oxygen levels at night. Reports sleep disruption, snoring and daytime sleepiness. Takes naps during the day occasionally. Long discussion about OSA treatment options including weight loss, oral appliance, CPAP and surgery. She is somewhat claustrophobic and does not like things touching her face. Showed patient samples of different types of mask, feels she may be able to try [employer] dreamwear full facemask.  03/19/20- Pulmonary NP, Tonya Patient has a tele-visit today for follow-up on new start CPAP.  She states that she has been doing well with her CPAP and is compliant.  Denies any current issues with mask.  She states that she is still trying to get adjusted to wearing the machine each night.  She does feel improvement throughout the day.  She complains today of some shortness of breath and wheezing for the past 3 days.  She has a visit scheduled with her PCP tomorrow for this issue.  Her PCP manages her asthma. Denies f/c/s, n/v/d, hemoptysis, PND, leg swelling.   10/03/20- Dr. Halford Chessman  She is doing well with CPAP.  Has nasal pillows mask.  Pressure okay.  Not having sinus congestion, sore throat, dry mouth, or aerophagia.  05/13/2020 Patient contacted today for virtual televisit for OSA. States that she hasn't been as diligent using her CPAP. She does have some difficulty falling asleep at  times wearing her mask. She uses nasal pillows. DME company is [employer]   Reports decreased energy. She does get short of breath with activity. States that it is more she does not have the stamina to do anything. She had left heart cath in March but did not have right heart cath. She is taking '40mg'$  lasix daily. She has some ankle swelling. She does not feel like her breathing symptoms are related to asthma. She has had poor compliance with her cpap and encouraged her to resume this d/t hx of pulmonary HTN. Denies chest tightness or overt wheezing.   OSA: - Recent poor compliance with CPAP - She denies issues with mask fit or pressure setting - Encouraged patient to resume wearing CPAP (aim to wear every night for 4-6 hours or more) - Continue auto CPAP pressure 5-15cm h20 - No changes today  Dyspnea on exertion: - Likely multifactorial d.t obesity, CAD, pulmonary HTN - Continue CPAP, diuretics and encourage weight loss - Symptoms do not sound consistent with asthma but may want to consider checking PFTs to rule out obstructive lung disease   Pulmonary HTN: - Secondary to OSA/OHS and PE - Echocardiogram in March 2021 showed right ventricular systolic pressure is severely elevated with  an estimated pressure of 71.7 mmHg - Target BP is 130/80 per cardiology    10/11/2022- interim hx  Patient presents today for OV follow-up OSA and pulmonary HTN.  Pulmonary hypertension felt to be secondary to OSA/OHS and PE. Poor CPAP complaint. Echocardiogram in March 2020 showed RV  pressure severely elevated with an estimated pressure 71.92mHg. Target BP 130/80 per cardiology.  She developed cough and shortness of breath in July/August. Associated lower extremity swelling. She has difficulty taking deep breaths. Symptoms affecting her ability to do ADLs. She saw cardiology in October, she was advised to discontinue Lasix today and start torsemide 20 mg daily. She declined adding SGLT2. She continues  Xarelto '20mg'$  daily for hx PE/DVT.  She was given medrol and prednisone by her PCP which has helped some but still has a cough. CXR in October 2023 showed unchanged mild bilateral interstitial thickening, chronic. No focal opacity, pleural effusion or pneumothorax. She has since gone back to lasix, she did not respond as well to torsemide.   HST in January 2020 showed mild OSA, AHI 14/hour with SpO2 low 73% (average 87%). She tried CPAP for several months and found it uncomfortable.  She has a hard time wearing anything on her face.  Sleep Study: HST 11/19/18 >> AHI 14.6, SpO2 low 73%.  Spent 270.4 min with SpO2 < 89%. Auto CPAP 09/04/19 to 10/03/19 >> used on 28 of 30 nights with average 5 hrs 43 min.  Average AHI 2 with median CPAP 7 and 95 th percentile CPAP 11 cm H2O  Cardiac tests: Echo 07/31/17 >> EF 60 to 65%, grade 1 DD, mild MR, PAS 40 mmHg Echo 01/14/19 >> EF 60-65%; Right ventricular systolic pressure is severely elevated with an estimated pressure of 71.7 mmHg.   01/14/19 >> 60-70% mid LAD with both DFR and FFR  Pulmonary test: CT angio chest 01/19/17 >> multiple PE with RV:LV 1 NO PFTS on file    Allergies  Allergen Reactions   Aspirin Shortness Of Breath   Lisinopril Anaphylaxis and Other (See Comments)    Possibly Lisinopril or Levaquin, pt was taking both at the time of reaction   Bee Venom Swelling   Morphine And Related Itching and Other (See Comments)    Needs benadryl prior to     Immunization History  Administered Date(s) Administered   Influenza Split 08/01/2013   Influenza, High Dose Seasonal PF 09/01/2017, 08/16/2018, 08/16/2019, 08/07/2020, 09/09/2022   Influenza-Unspecified 08/09/2012, 09/30/2015, 07/02/2016, 08/16/2019, 08/19/2021   Moderna Covid-19 Vaccine Bivalent Booster 165yr& up 08/19/2021, 09/09/2022   Moderna Sars-Covid-2 Vaccination 01/10/2020, 02/07/2020, 09/16/2020   PNEUMOCOCCAL CONJUGATE-20 05/24/2022   Pneumococcal Conjugate-13 10/19/2015,  07/20/2017   Pneumococcal Polysaccharide-23 05/06/1999, 09/16/2019   Zoster, Live 08/09/2012    Past Medical History:  Diagnosis Date   Anemia    none recently   Anxiety    Arthritis    Asthma    Complete tear of right rotator cuff 12/05/2014   Coronary artery disease    Depression    Diabetes mellitus    Last A1C was 6.2 on 09/18/2020   Diverticulitis    Diverticulosis    Family history of adverse reaction to anesthesia    mom  nausea and vomiting   GERD (gastroesophageal reflux disease)    History of kidney stones    seen on scan, has not been a problem.   Hypertension    Hypothyroidism    Migraine    Personal history of kidney stones    Pneumonia    hx   [place] w/ imaging showing multiple right lung PE with right heart strain. Started on Xarelto.    Shortness of breath dyspnea    exersion   Thyroid disease    hypo  Wears glasses     Tobacco History: Social History   Tobacco Use  Smoking Status Former   Packs/day: 0.50   Years: 6.00   Total pack years: 3.00   Types: Cigarettes   Quit date: 11/14/1982   Years since quitting: 39.9  Smokeless Tobacco Never  Tobacco Comments   quit smoking 30 years ago   Counseling given: Not Answered Tobacco comments: quit smoking 30 years ago   Outpatient Medications Prior to Visit  Medication Sig Dispense Refill   acetaminophen (TYLENOL) 500 MG tablet Take 500 mg by mouth every 6 (six) hours as needed.     albuterol (VENTOLIN HFA) 108 (90 Base) MCG/ACT inhaler INHALE 2 PUFFS INTO THE LUNGS EVERY 6 (SIX) HOURS AS NEEDED FOR WHEEZING OR SHORTNESS OF BREATH 18 each 11   ALPRAZolam (XANAX) 0.5 MG tablet TAKE 1 TABLET BY MOUTH TWICE A DAY AS NEEDED 180 tablet 0   atorvastatin (LIPITOR) 80 MG tablet Take 1 tablet (80 mg total) by mouth daily at 6 PM. 90 tablet 0   benzonatate (TESSALON) 100 MG capsule Take 1 capsule (100 mg total) by mouth 3 (three) times daily as needed for cough.  30 capsule 0   cetirizine (ZYRTEC) 10 MG tablet Take 10 mg by mouth at bedtime.     Cholecalciferol (VITAMIN D3) 5000 units CAPS Take 5,000 Units by mouth daily.     clobetasol ointment (TEMOVATE) 7.35 % Apply 1 application topically 2 (two) times daily as needed (skin irritation).     EPINEPHrine 0.3 mg/0.3 mL IJ SOAJ injection Inject 0.3 mLs (0.3 mg total) into the muscle as needed for anaphylaxis. 1 each 11   ergocalciferol (DRISDOL) 1.25 MG (50000 UT) capsule Take 1 capsule (50,000 Units total) by mouth once a week. 12 capsule 3   escitalopram (LEXAPRO) 10 MG tablet TAKE 1 TABLET BY MOUTH EVERY DAY 90 tablet 3   famotidine (PEPCID) 10 MG tablet Take 10 mg by mouth at bedtime.     furosemide (LASIX) 20 MG tablet Take 1 tablet (20 mg total) by mouth 2 (two) times daily. 180 tablet 2   levothyroxine (SYNTHROID) 200 MCG tablet TAKE 1 TABLET BY MOUTH EVERY DAY ON EMPTY STOMACH BEFORE BREAKFAST 90 tablet 1   magnesium oxide (MAG-OX) 400 MG tablet Take 1 tablet by mouth on Tuesdays, Thursdays, Saturdays, & Sundays.  Take 1 tablet by mouth twice a day on Mondays, Wednesdays, & Fridays (Patient taking differently: Take 400 mg by mouth See admin instructions. Take 400 mg by mouth once daily on Tuesdays, Thursdays, Saturdays, & Sundays and take 400 mg twice a day on Mondays, Wednesdays, & Fridays) 60 tablet 11   metoprolol tartrate (LOPRESSOR) 25 MG tablet TAKE 1 TABLET BY MOUTH EVERY DAY IN THE MORNING 90 tablet 1   montelukast (SINGULAIR) 10 MG tablet TAKE 1 TABLET BY MOUTH EVERYDAY AT BEDTIME 90 tablet 3   Multiple Vitamins-Minerals (PRESERVISION AREDS 2 PO) Take 1 capsule by mouth daily.     olmesartan (BENICAR) 40 MG tablet TAKE 1 TABLET BY MOUTH EVERY DAY 90 tablet 3   predniSONE (DELTASONE) 10 MG tablet Take in tapering course as directed 6-5-4-3-2-1 21 tablet 0   traZODone (DESYREL) 150 MG tablet TAKE 1 TABLET BY MOUTH EVERYDAY AT BEDTIME 90 tablet 1   XARELTO 20 MG TABS tablet TAKE 1 TABLET BY  MOUTH DAILY WITH SUPPER 90 tablet 1   zolpidem (AMBIEN) 10 MG tablet TAKE 1 TABLET BY MOUTH EVERY DAY AT BEDTIME AS NEEDED  90 tablet 1   nitroGLYCERIN (NITROSTAT) 0.4 MG SL tablet Place 1 tablet (0.4 mg total) under the tongue every 5 (five) minutes as needed for chest pain. 100 tablet 3   No facility-administered medications prior to visit.      Review of Systems  Review of Systems  Constitutional: Negative.   HENT: Negative.    Respiratory:  Positive for cough and shortness of breath.   Cardiovascular:  Positive for leg swelling.     Physical Exam  BP 116/64 (BP Location: Right Arm, Patient Position: Sitting, Cuff Size: Large)   Pulse 61   Temp 98.6 F (37 C) (Oral)   Ht 5' 3.5" (1.613 m)   Wt 291 lb 3.2 oz (132.1 kg)   SpO2 95%   BMI 50.77 kg/m  Physical Exam Constitutional:      Appearance: Normal appearance. She is obese.  HENT:     Head: Normocephalic and atraumatic.     Mouth/Throat:     Mouth: Mucous membranes are moist.     Pharynx: Oropharynx is clear.  Cardiovascular:     Rate and Rhythm: Normal rate and regular rhythm.     Comments: Trace BLE edema  Pulmonary:     Effort: Pulmonary effort is normal.     Breath sounds: Normal breath sounds. No wheezing or rales.  Skin:    General: Skin is warm.  Neurological:     General: No focal deficit present.     Mental Status: She is alert and oriented to person, place, and time. Mental status is at baseline.  Psychiatric:        Mood and Affect: Mood normal.        Behavior: Behavior normal.        Thought Content: Thought content normal.        Judgment: Judgment normal.      Lab Results:  CBC    Component Value Date/Time   WBC 9.2 10/04/2022 1026   RBC 4.40 10/04/2022 1026   HGB 12.5 10/04/2022 1026   HGB 11.9 01/23/2019 0910   HGB 12.9 09/05/2006 1321   HCT 38.5 10/04/2022 1026   HCT 37.7 01/23/2019 0910   HCT 38.2 09/05/2006 1321   PLT 259 10/04/2022 1026   PLT 348 01/23/2019 0910   MCV  87.5 10/04/2022 1026   MCV 87 01/23/2019 0910   MCV 85.1 09/05/2006 1321   MCH 28.4 10/04/2022 1026   MCHC 32.5 10/04/2022 1026   RDW 12.2 10/04/2022 1026   RDW 13.4 01/23/2019 0910   RDW 13.6 09/05/2006 1321   LYMPHSABS 2,944 10/04/2022 1026   LYMPHSABS 2.7 01/23/2019 0910   LYMPHSABS 3.1 09/05/2006 1321   MONOABS 2.3 (H) 01/15/2019 0355   MONOABS 0.7 09/05/2006 1321   EOSABS 534 (H) 10/04/2022 1026   EOSABS 0.6 (H) 01/23/2019 0910   BASOSABS 64 10/04/2022 1026   BASOSABS 0.1 01/23/2019 0910   BASOSABS 0.0 09/05/2006 1321    BMET    Component Value Date/Time   NA 142 10/04/2022 1026   NA 141 09/13/2022 1208   K 4.2 10/04/2022 1026   CL 102 10/04/2022 1026   CO2 33 (H) 10/04/2022 1026   GLUCOSE 138 10/04/2022 1026   BUN 26 (H) 10/04/2022 1026   BUN 22 09/13/2022 1208   CREATININE 1.45 (H) 10/04/2022 1026   CALCIUM 9.4 10/04/2022 1026   GFRNONAA 48 (L) 04/27/2021 1240   GFRAA 56 (L) 04/27/2021 1240    BNP    Component Value Date/Time  BNP 29 10/04/2022 1026    ProBNP    Component Value Date/Time   PROBNP 67.0 04/12/2008 0500    Imaging: No results found.   Assessment & Plan:   Other secondary pulmonary hypertension ([place]) - Secondary to OHS/OSA and pulmonary embolism. She is on anticoagulation with Xarelto but intolerant to CPAP. She reports increased dyspnea symptoms and cough x 3-4 months. Echocardiogram in March 2020 showed RV pressure severely elevated with an estimated pressure 71.87mHG - Needs updated echocardiogram - Checking ONO on RA, likely needs to be started on nocturnal oxygen - Recommend she see Dr. HSilas Floodto evaluate PH   OSA (obstructive sleep apnea) -  HST in January 2020 showed mild OSA, AHI 14/hour with SpO2 low 73% (average 87%). She trial and failed CPAP. Recommend referral to orthodontics for consideration oral appliance   Asthma - Dyspnea since July/August. She has difficulty taking deep breaths. Symptoms affecting her ability  to do ADLs. Responded to prednisone prescribed by PCP. CXR in October 2023 showed unchanged mild bilateral interstitial thickening, chronic. Needs pulmonary function testing to assess for obstructive lung disease. Scheduled for CT chest.   Heart failure with preserved ejection fraction (HCC) - No evidence of volume overload on exam. Following with cardiology. Attempted on Torsemide but did not see results. She is currently taking lasix '20mg'$  twice daily.   Pulmonary embolism with acute cor pulmonale (HCC) - Maintained on Xarelto '20mg'$  daily    EMartyn Ehrich NP 10/11/2022

## 2022-10-11 NOTE — Progress Notes (Signed)
Reviewed and agree with assessment/plan.   Chesley Mires, MD [facility] Pulmonary/Critical Care 10/11/2022, 3:50 PM Pager:  [phone]

## 2022-10-11 NOTE — Assessment & Plan Note (Addendum)
-   Dyspnea since July/August. She has difficulty taking deep breaths. Symptoms affecting her ability to do ADLs. Responded to prednisone prescribed by PCP. CXR in October 2023 showed unchanged mild bilateral interstitial thickening, chronic. Needs pulmonary function testing to assess for obstructive lung disease. Scheduled for CT chest.

## 2022-10-11 NOTE — Assessment & Plan Note (Addendum)
-   Secondary to OHS/OSA and pulmonary embolism. She is on anticoagulation with Xarelto but intolerant to CPAP. She reports increased dyspnea symptoms and cough x 3-4 months. Echocardiogram in March 2020 showed RV pressure severely elevated with an estimated pressure 71.30mHG - Needs updated echocardiogram - Checking ONO on RA, likely needs to be started on nocturnal oxygen - [facility]

## 2022-10-11 NOTE — Patient Instructions (Addendum)
Pulmonary hypertension is due to underlying sleep apnea and history of pulmonary embolism.    You need repeat echocardiogram to reassess right ventricular pressure and overnight oximetry test to get you oxygen to use at night  Treatment options for OSA other than CPAP include weight loss, oral appliance or referral to ENT for consideration for inspire device  Orders: Walk test in office on forehead probe  Pulmonary function testing re: shortness of breath (please order) Echocardiogram re: shortness of breath/pulm HTN (please order) Overnight oximetry test re: sob/ pulm HTN (please order)   Referral: Orthodontics re: hx mild OSA, oral appliance consideration (sleep study Jan 2020)  Follow-up First available with Dr. Silas Flood (openings in January) - new patient visit/ pulmonary hypertension

## 2022-10-11 NOTE — Assessment & Plan Note (Addendum)
-    HST in January 2020 showed mild OSA, AHI 14/hour with SpO2 low 73% (average 87%). She trial and failed CPAP. Recommend referral to orthodontics for consideration oral appliance

## 2022-10-11 NOTE — Assessment & Plan Note (Addendum)
-   No evidence of volume overload on exam. Following with cardiology. Attempted on Torsemide but did not see results. She is currently taking lasix '20mg'$  twice daily.

## 2022-10-11 NOTE — Assessment & Plan Note (Signed)
-   Maintained on Xarelto '20mg'$  daily

## 2022-10-12 NOTE — Addendum Note (Signed)
Addended by: Elton Sin on: 10/12/2022 09:14 AM   Modules accepted: Orders

## 2022-10-14 ENCOUNTER — Other Ambulatory Visit (HOSPITAL_COMMUNITY): Payer: Medicare Other

## 2022-10-20 ENCOUNTER — Ambulatory Visit (HOSPITAL_COMMUNITY)
Admission: RE | Admit: 2022-10-20 | Discharge: 2022-10-20 | Disposition: A | Discharge status: Home / Self Care | Payer: Medicare Other | Source: Ambulatory Visit | Attending: Primary Care | Admitting: Primary Care

## 2022-10-20 DIAGNOSIS — E119 Type 2 diabetes mellitus without complications: Secondary | ICD-10-CM | POA: Insufficient documentation

## 2022-10-20 DIAGNOSIS — I2510 Atherosclerotic heart disease of native coronary artery without angina pectoris: Secondary | ICD-10-CM | POA: Diagnosis not present

## 2022-10-20 DIAGNOSIS — I270 Primary pulmonary hypertension: Secondary | ICD-10-CM | POA: Diagnosis not present

## 2022-10-20 DIAGNOSIS — R0602 Shortness of breath: Secondary | ICD-10-CM | POA: Insufficient documentation

## 2022-10-20 DIAGNOSIS — K219 Gastro-esophageal reflux disease without esophagitis: Secondary | ICD-10-CM | POA: Diagnosis not present

## 2022-10-20 LAB — ECHOCARDIOGRAM COMPLETE
Area-P 1/2: 3.91 cm2
Calc EF: 64.4 %
S' Lateral: 2.9 cm
Single Plane A2C EF: 66.1 %
Single Plane A4C EF: 62.3 %

## 2022-10-20 MED ORDER — PERFLUTREN LIPID MICROSPHERE
1.0000 mL | INTRAVENOUS | Status: AC | PRN
  Administered 2022-10-20: 3 mL via INTRAVENOUS

## 2022-10-20 NOTE — Progress Notes (Signed)
  Echocardiogram 2D Echocardiogram has been performed.  [name] 10/20/2022, 2:52 PM

## 2022-11-01 DIAGNOSIS — Z7952 Long term (current) use of systemic steroids: Secondary | ICD-10-CM | POA: Diagnosis not present

## 2022-11-01 DIAGNOSIS — L438 Other lichen planus: Secondary | ICD-10-CM | POA: Diagnosis not present

## 2022-11-01 DIAGNOSIS — L570 Actinic keratosis: Secondary | ICD-10-CM | POA: Diagnosis not present

## 2022-11-04 ENCOUNTER — Ambulatory Visit
Admission: RE | Admit: 2022-11-04 | Discharge: 2022-11-04 | Disposition: A | Discharge status: Home / Self Care | Payer: Medicare Other | Source: Ambulatory Visit | Attending: Internal Medicine | Admitting: Internal Medicine

## 2022-11-04 DIAGNOSIS — I2510 Atherosclerotic heart disease of native coronary artery without angina pectoris: Secondary | ICD-10-CM | POA: Diagnosis not present

## 2022-11-04 DIAGNOSIS — J8410 Pulmonary fibrosis, unspecified: Secondary | ICD-10-CM | POA: Diagnosis not present

## 2022-11-04 DIAGNOSIS — I288 Other diseases of pulmonary vessels: Secondary | ICD-10-CM | POA: Diagnosis not present

## 2022-11-04 MED ORDER — IOPAMIDOL (ISOVUE-300) INJECTION 61%
50.0000 mL | Freq: Once | INTRAVENOUS | Status: AC | PRN
  Administered 2022-11-04: 50 mL via INTRAVENOUS

## 2022-11-21 ENCOUNTER — Ambulatory Visit (INDEPENDENT_AMBULATORY_CARE_PROVIDER_SITE_OTHER): Payer: Medicare Other | Admitting: Pulmonary Disease

## 2022-11-21 ENCOUNTER — Ambulatory Visit (INDEPENDENT_AMBULATORY_CARE_PROVIDER_SITE_OTHER): Payer: Medicare Other

## 2022-11-21 ENCOUNTER — Encounter: Payer: Self-pay | Admitting: Pulmonary Disease

## 2022-11-21 ENCOUNTER — Ambulatory Visit: Payer: Medicare Other | Admitting: Pulmonary Disease

## 2022-11-21 VITALS — BP 116/70 | HR 72 | Ht 63.5 in | Wt 288.6 lb

## 2022-11-21 DIAGNOSIS — Z86711 Personal history of pulmonary embolism: Secondary | ICD-10-CM

## 2022-11-21 DIAGNOSIS — I2729 Other secondary pulmonary hypertension: Secondary | ICD-10-CM

## 2022-11-21 DIAGNOSIS — I10 Essential (primary) hypertension: Secondary | ICD-10-CM | POA: Diagnosis not present

## 2022-11-21 NOTE — Patient Instructions (Signed)
Nice to meet you  I think neck step is getting more pieces of the puzzle  Will get a perfusion scan to make sure the blood clot in 2018 has gone away.  Will get a high-resolution CT scan of your chest to make sure there are no unusual or abnormal findings that may explain some of the pulmonary hypertension.  Return to clinic in 4 to 6 weeks Dr. Silas Flood

## 2022-11-21 NOTE — Progress Notes (Signed)
$'@Patient'P$  ID: [name], female    DOB: December 26, 1950, 72 y.o.   MRN: [id number]  Chief Complaint  Patient presents with   Consult    Pulmonary HTN    Referring provider: Elby Showers, MD  HPI:   72 y.o. woman whom we are seeing in evaluation of pulmonary hypertension.  Chief complaint is cough.  Most recent PCP note reviewed.  Most recent pulmonary note [name], NP 09/2022 reviewed.  Patient first mentions cough today.  Started what sounds like viral illness.  Cough lingered.  Was given Solu-Medrol shot, prednisone taper, sounds like antibiotics.  Cough markedly improved.  Although somewhat persists.  She think steroids is most beneficial.  In general, she thinks her asthma is well-controlled.  Her as needed albuterol has not really affected the cough positively or negatively, not really any improvement or change.  Reason for referral was pulmonary hypertension.  She had a submassive PE presumably submassive PE 01/2017 with TTE demonstrating RV dysfunction and dilated RV.  It is possible that was chronic and predated this.  On repeat TTE 01/2019 findings were similar with back the question of either chronic thromboembolic disease or preceding pulmonary hypertension that was just discovered in the setting of pulmonary embolus.  She had left heart catheterization in 2020 with mildly elevated LVEDP to 17.  She had a sleep test in 2020 that demonstrated mild apnea.  She is not able to tolerate CPAP therapy.  Not using any therapies for OSA.  Her most recent TTE 10/2022 is largely unchanged, RV dysfunction, dilated RV, dilated RA, largely unchanged dating back to 2018, 5-1/2 years prior.  We discussed the underlying etiologies of possible pulmonary hypertension in general and as it pertains to her specific case.  We discussed the role and rationale of additional diagnostic testing up to and including formal diagnosis of port after the right heart cath.  Discussed the role and  rationale for therapeutic options, the risk and benefits, adverse events etc. with pulmonary vasodilators.  Questionaires / Pulmonary Flowsheets:   ACT:      No data to display          MMRC:     No data to display          Epworth:     10/22/2018   11:00 AM  Results of the Epworth flowsheet  Sitting and reading 0  Watching TV 1  Sitting, inactive in a public place (e.g. a theatre or a meeting) 0  As a passenger in a car for an hour without a break 0  Lying down to rest in the afternoon when circumstances permit 3  Sitting and talking to someone 0  Sitting quietly after a lunch without alcohol 0  In a car, while stopped for a few minutes in traffic 0  Total score 4    Tests:   FENO:  No results found for: "NITRICOXIDE"  PFT:     No data to display          WALK:     10/11/2022    1:48 PM  SIX MIN WALK  Supplimental Oxygen during Test? (L/min) No  Tech Comments: Pt was unable to complete 3 laps. She stated she was tired after 2 laps. Pt walked at a regular pace without stopping or complaining of SOB for 2 laps.    Imaging: Personally reviewed and as per EMR discussion in this note CT Chest W Contrast  Result Date: 11/07/2022 CLINICAL DATA:  Chronic  cough. EXAM: CT CHEST WITH CONTRAST TECHNIQUE: Multidetector CT imaging of the chest was performed during intravenous contrast administration. RADIATION DOSE REDUCTION: This exam was performed according to the departmental dose-optimization program which includes automated exposure control, adjustment of the mA and/or kV according to patient size and/or use of iterative reconstruction technique. CONTRAST:  27m ISOVUE-300 IOPAMIDOL (ISOVUE-300) INJECTION 61% COMPARISON:  01/19/2017. FINDINGS: Cardiovascular: Heart normal in size and configuration. Mild three-vessel coronary artery calcifications. No pericardial effusion. Dilated main pulmonary artery to 4.1 cm. Normal caliber thoracic aorta. Mediastinum/Nodes:  No neck base, mediastinal or hilar masses. No enlarged lymph nodes. Trachea and esophagus are unremarkable. Lungs/Pleura: Mild subpleural reticulation in the lower lobes. No lung consolidation. No pulmonary edema. No lung mass or suspicious nodule. Stable calcified right lower lobe granuloma. Similar appearance to the prior chest CT. No pleural effusion or pneumothorax. Upper Abdomen: Unremarkable. Musculoskeletal: No fracture or acute finding. No bone lesion. No chest wall mass. IMPRESSION: 1. No acute findings. 2. Mild subpleural reticulation in the lung bases, mostly the lower lobes. No evidence of pneumonia or pulmonary edema. 3. Enlarged main pulmonary artery raising suspicion for pulmonary artery hypertension. This is similar to the prior CT. 4. Mild coronary artery calcifications. Electronically Signed   By: DLajean ManesM.D.   On: 11/07/2022 17:44    Lab Results: Personally reviewed CBC    Component Value Date/Time   WBC 9.2 10/04/2022 1026   RBC 4.40 10/04/2022 1026   HGB 12.5 10/04/2022 1026   HGB 11.9 01/23/2019 0910   HGB 12.9 09/05/2006 1321   HCT 38.5 10/04/2022 1026   HCT 37.7 01/23/2019 0910   HCT 38.2 09/05/2006 1321   PLT 259 10/04/2022 1026   PLT 348 01/23/2019 0910   MCV 87.5 10/04/2022 1026   MCV 87 01/23/2019 0910   MCV 85.1 09/05/2006 1321   MCH 28.4 10/04/2022 1026   MCHC 32.5 10/04/2022 1026   RDW 12.2 10/04/2022 1026   RDW 13.4 01/23/2019 0910   RDW 13.6 09/05/2006 1321   LYMPHSABS 2,944 10/04/2022 1026   LYMPHSABS 2.7 01/23/2019 0910   LYMPHSABS 3.1 09/05/2006 1321   MONOABS 2.3 (H) 01/15/2019 0355   MONOABS 0.7 09/05/2006 1321   EOSABS 534 (H) 10/04/2022 1026   EOSABS 0.6 (H) 01/23/2019 0910   BASOSABS 64 10/04/2022 1026   BASOSABS 0.1 01/23/2019 0910   BASOSABS 0.0 09/05/2006 1321    BMET    Component Value Date/Time   NA 142 10/04/2022 1026   NA 141 09/13/2022 1208   K 4.2 10/04/2022 1026   CL 102 10/04/2022 1026   CO2 33 (H) 10/04/2022 1026    GLUCOSE 138 10/04/2022 1026   BUN 26 (H) 10/04/2022 1026   BUN 22 09/13/2022 1208   CREATININE 1.45 (H) 10/04/2022 1026   CALCIUM 9.4 10/04/2022 1026   GFRNONAA 48 (L) 04/27/2021 1240   GFRAA 56 (L) 04/27/2021 1240    BNP    Component Value Date/Time   BNP 29 10/04/2022 1026    ProBNP    Component Value Date/Time   PROBNP 67.0 04/12/2008 0500    Specialty Problems       Pulmonary Problems   Asthma    Qualifier: Diagnosis of  By: PHardin NegusCMA (AAMA), S[place]letta [place]       Allergic rhinitis   OSA (obstructive sleep apnea)    Allergies  Allergen Reactions   Aspirin Shortness Of Breath   Lisinopril Anaphylaxis and Other (See Comments)    Possibly Lisinopril or Levaquin,  pt was taking both at the time of reaction   Bee Venom Swelling   Morphine And Related Itching and Other (See Comments)    Needs benadryl prior to     Immunization History  Administered Date(s) Administered   Influenza Split 08/01/2013   Influenza, High Dose Seasonal PF 09/01/2017, 08/16/2018, 08/16/2019, 08/07/2020, 09/09/2022   Influenza-Unspecified 08/09/2012, 09/30/2015, 07/02/2016, 08/16/2019, 08/19/2021   Moderna Covid-19 Vaccine Bivalent Booster 65yr & up 08/19/2021, 09/09/2022   Moderna Sars-Covid-2 Vaccination 01/10/2020, 02/07/2020, 09/16/2020   PNEUMOCOCCAL CONJUGATE-20 05/24/2022   Pneumococcal Conjugate-13 10/19/2015, 07/20/2017   Pneumococcal Polysaccharide-23 05/06/1999, 09/16/2019   Zoster, Live 08/09/2012    Past Medical History:  Diagnosis Date   Anemia    none recently   Anxiety    Arthritis    Asthma    Complete tear of right rotator cuff 12/05/2014   Coronary artery disease    Depression    Diabetes mellitus    Last A1C was 6.2 on 09/18/2020   Diverticulitis    Diverticulosis    Family history of adverse reaction to anesthesia    mom  nausea and vomiting   GERD (gastroesophageal reflux disease)    History of kidney stones    seen on scan, has not been a problem.    Hypertension    Hypothyroidism    Migraine    Personal history of kidney stones    Pneumonia    hx   Pulmonary embolism ([place]    a. diagnosed in 01/2017 w/ imaging showing multiple right lung PE with right heart strain. Started on Xarelto.    Shortness of breath dyspnea    exersion   Thyroid disease    hypo   Wears glasses     Tobacco History: Social History   Tobacco Use  Smoking Status Former   Packs/day: 0.50   Years: 6.00   Total pack years: 3.00   Types: Cigarettes   Quit date: 11/14/1982   Years since quitting: 40.0  Smokeless Tobacco Never  Tobacco Comments   quit smoking 30 years ago   Counseling given: Not Answered Tobacco comments: quit smoking 30 years ago   Continue to not smoke  Outpatient Encounter Medications as of 11/21/2022  Medication Sig   acetaminophen (TYLENOL) 500 MG tablet Take 500 mg by mouth every 6 (six) hours as needed.   albuterol (VENTOLIN HFA) 108 (90 Base) MCG/ACT inhaler INHALE 2 PUFFS INTO THE LUNGS EVERY 6 (SIX) HOURS AS NEEDED FOR WHEEZING OR SHORTNESS OF BREATH   ALPRAZolam (XANAX) 0.5 MG tablet TAKE 1 TABLET BY MOUTH TWICE A DAY AS NEEDED   atorvastatin (LIPITOR) 80 MG tablet Take 1 tablet (80 mg total) by mouth daily at 6 PM.   cetirizine (ZYRTEC) 10 MG tablet Take 10 mg by mouth at bedtime.   Cholecalciferol (VITAMIN D3) 5000 units CAPS Take 5,000 Units by mouth daily.   clobetasol ointment (TEMOVATE) 07.32% Apply 1 application topically 2 (two) times daily as needed (skin irritation).   EPINEPHrine 0.3 mg/0.3 mL IJ SOAJ injection Inject 0.3 mLs (0.3 mg total) into the muscle as needed for anaphylaxis.   ergocalciferol (DRISDOL) 1.25 MG (50000 UT) capsule Take 1 capsule (50,000 Units total) by mouth once a week.   escitalopram (LEXAPRO) 10 MG tablet TAKE 1 TABLET BY MOUTH EVERY DAY   famotidine (PEPCID) 10 MG tablet Take 10 mg by mouth at bedtime.   furosemide (LASIX) 20 MG tablet Take 1 tablet (20 mg total) by mouth 2 (two) times  daily.   levothyroxine (SYNTHROID) 200 MCG tablet TAKE 1 TABLET BY MOUTH EVERY DAY ON EMPTY STOMACH BEFORE BREAKFAST   magnesium oxide (MAG-OX) 400 MG tablet Take 1 tablet by mouth on Tuesdays, Thursdays, Saturdays, & Sundays.  Take 1 tablet by mouth twice a day on Mondays, Wednesdays, & Fridays (Patient taking differently: Take 400 mg by mouth See admin instructions. Take 400 mg by mouth once daily on Tuesdays, Thursdays, Saturdays, & Sundays and take 400 mg twice a day on Mondays, Wednesdays, & Fridays)   metoprolol tartrate (LOPRESSOR) 25 MG tablet TAKE 1 TABLET BY MOUTH EVERY DAY IN THE MORNING   montelukast (SINGULAIR) 10 MG tablet TAKE 1 TABLET BY MOUTH EVERYDAY AT BEDTIME   Multiple Vitamins-Minerals (PRESERVISION AREDS 2 PO) Take 1 capsule by mouth daily.   olmesartan (BENICAR) 40 MG tablet TAKE 1 TABLET BY MOUTH EVERY DAY   traZODone (DESYREL) 150 MG tablet TAKE 1 TABLET BY MOUTH EVERYDAY AT BEDTIME   XARELTO 20 MG TABS tablet TAKE 1 TABLET BY MOUTH DAILY WITH SUPPER   zolpidem (AMBIEN) 10 MG tablet TAKE 1 TABLET BY MOUTH EVERY DAY AT BEDTIME AS NEEDED   benzonatate (TESSALON) 100 MG capsule Take 1 capsule (100 mg total) by mouth 3 (three) times daily as needed for cough.   nitroGLYCERIN (NITROSTAT) 0.4 MG SL tablet Place 1 tablet (0.4 mg total) under the tongue every 5 (five) minutes as needed for chest pain.   predniSONE (DELTASONE) 10 MG tablet Take in tapering course as directed 6-5-4-3-2-1   No facility-administered encounter medications on file as of 11/21/2022.     Review of Systems  Review of Systems  No chest pain with exertion.  No orthopnea or PND.  Comprehensive review of systems otherwise negative. Physical Exam  BP 116/70 (BP Location: Left Wrist, Cuff Size: Large)   Pulse 72   Ht 5' 3.5" (1.613 m)   Wt 288 lb 9.6 oz (130.9 kg)   SpO2 92%   BMI 50.32 kg/m   Wt Readings from Last 5 Encounters:  11/21/22 288 lb 9.6 oz (130.9 kg)  10/11/22 291 lb 3.2 oz (132.1 kg)   08/29/22 296 lb (134.3 kg)  05/24/22 287 lb 12 oz (130.5 kg)  11/16/21 269 lb (122 kg)    BMI Readings from Last 5 Encounters:  11/21/22 50.32 kg/m  10/11/22 50.77 kg/m  08/29/22 51.21 kg/m  05/24/22 49.78 kg/m  11/16/21 46.54 kg/m     Physical Exam General: Sitting in chair, no acute distress Eyes: EOMI, no icterus Neck: Supple, no JVP appreciated Pulmonary: Distant, clear Cardiovascular: Tachycardic, regular rhythm Abdomen: Nondistended, bowel sounds present MSK: No synovitis, no joint effusion Neuro: Normal gait with assistance of rollator, no weakness Psych: Normal mood, full affect   Assessment & Plan:   Presumed pulmonary hypertension: Based on serial echocardiograms, signs of pulmonary hypertension and RV dysfunction dating back to 01/2017.  This was during the time of acute PE.  Unchanged in 01/2019.  Largely unchanged 12/23 3, most recently.  Possible etiologies include group 3 disease in the setting of OHS/OSA and nonadherence to CPAP, group 2 disease with mild interstitial changes on recent CT scan although this could just be atelectasis which is favored, group 4 disease in the setting of pulmonary embolism history, Group 1 disease, PAH.  Further workup with CT high-resolution prone and supine to evaluate linear changes at the bases.  Perfusion scan to evaluate possible chronic thromboembolic disease.  Reassess in coming weeks after further information obtained.  Discussed role of right heart catheterization, role of therapeutic intervention if indicated based on hemodynamic profile, underlying contributor to disease.  OSA: Based on home sleep test 2020.  Not adherent to CPAP.  Discussed role of dental evaluation.  She is precontemplative.  Discussed we will need to treat OSA in order to effectively treat presumed pulmonary hypertension.  Cough: Worsened over last couple months.  Markedly improved although not resolved after steroid therapy.  Suspect related to asthma,  cough variant asthma.  Consider addition of maintenance ICS/LABA therapy in the future.   Return in about 6 weeks (around 01/02/2023).   Lanier Clam, MD 11/21/2022   This appointment required 70 minutes of patient care (this includes precharting, chart review, review of results, face-to-face care, etc.).

## 2022-11-22 ENCOUNTER — Other Ambulatory Visit: Payer: Self-pay

## 2022-11-22 DIAGNOSIS — Z86711 Personal history of pulmonary embolism: Secondary | ICD-10-CM

## 2022-11-29 ENCOUNTER — Ambulatory Visit (HOSPITAL_COMMUNITY)
Admission: RE | Admit: 2022-11-29 | Discharge: 2022-11-29 | Disposition: A | Discharge status: Home / Self Care | Payer: Medicare Other | Source: Ambulatory Visit | Attending: Pulmonary Disease | Admitting: Pulmonary Disease

## 2022-11-29 ENCOUNTER — Other Ambulatory Visit: Payer: Medicare Other

## 2022-11-29 ENCOUNTER — Encounter (HOSPITAL_COMMUNITY)
Admission: RE | Admit: 2022-11-29 | Discharge: 2022-11-29 | Disposition: A | Discharge status: Home / Self Care | Payer: Medicare Other | Source: Ambulatory Visit | Attending: Pulmonary Disease | Admitting: Pulmonary Disease

## 2022-11-29 DIAGNOSIS — Z86711 Personal history of pulmonary embolism: Secondary | ICD-10-CM

## 2022-11-29 DIAGNOSIS — I10 Essential (primary) hypertension: Secondary | ICD-10-CM

## 2022-11-29 DIAGNOSIS — E1169 Type 2 diabetes mellitus with other specified complication: Secondary | ICD-10-CM

## 2022-11-29 DIAGNOSIS — I700 Atherosclerosis of aorta: Secondary | ICD-10-CM | POA: Diagnosis not present

## 2022-11-29 DIAGNOSIS — Z0389 Encounter for observation for other suspected diseases and conditions ruled out: Secondary | ICD-10-CM | POA: Diagnosis not present

## 2022-11-29 DIAGNOSIS — I2729 Other secondary pulmonary hypertension: Secondary | ICD-10-CM

## 2022-11-29 DIAGNOSIS — E1100 Type 2 diabetes mellitus with hyperosmolarity without nonketotic hyperglycemic-hyperosmolar coma (NKHHC): Secondary | ICD-10-CM

## 2022-11-29 DIAGNOSIS — I272 Pulmonary hypertension, unspecified: Secondary | ICD-10-CM | POA: Diagnosis not present

## 2022-11-29 DIAGNOSIS — J479 Bronchiectasis, uncomplicated: Secondary | ICD-10-CM | POA: Diagnosis not present

## 2022-11-29 DIAGNOSIS — E039 Hypothyroidism, unspecified: Secondary | ICD-10-CM

## 2022-11-29 DIAGNOSIS — J84112 Idiopathic pulmonary fibrosis: Secondary | ICD-10-CM | POA: Diagnosis not present

## 2022-11-29 MED ORDER — TECHNETIUM TO 99M ALBUMIN AGGREGATED
3.7500 | Freq: Once | INTRAVENOUS | Status: AC | PRN
  Administered 2022-11-29: 3.75 via INTRAVENOUS

## 2022-11-30 ENCOUNTER — Other Ambulatory Visit: Payer: Self-pay | Admitting: Internal Medicine

## 2022-11-30 LAB — COMPLETE METABOLIC PANEL WITH GFR
AG Ratio: 1.4 (calc) (ref 1.0–2.5)
ALT: 11 U/L (ref 6–29)
AST: 15 U/L (ref 10–35)
Albumin: 3.8 g/dL (ref 3.6–5.1)
Alkaline phosphatase (APISO): 81 U/L (ref 37–153)
BUN/Creatinine Ratio: 14 (calc) (ref 6–22)
BUN: 16 mg/dL (ref 7–25)
CO2: 30 mmol/L (ref 20–32)
Calcium: 9.5 mg/dL (ref 8.6–10.4)
Chloride: 103 mmol/L (ref 98–110)
Creat: 1.11 mg/dL — ABNORMAL HIGH (ref 0.60–1.00)
Globulin: 2.8 g/dL (calc) (ref 1.9–3.7)
Glucose, Bld: 128 mg/dL — ABNORMAL HIGH (ref 65–99)
Potassium: 4.6 mmol/L (ref 3.5–5.3)
Sodium: 141 mmol/L (ref 135–146)
Total Bilirubin: 0.5 mg/dL (ref 0.2–1.2)
Total Protein: 6.6 g/dL (ref 6.1–8.1)
eGFR: 53 mL/min/{1.73_m2} — ABNORMAL LOW (ref >=60)

## 2022-11-30 LAB — LIPID PANEL
Cholesterol: 95 mg/dL (ref <200)
HDL: 40 mg/dL — ABNORMAL LOW (ref >=50)
LDL Cholesterol (Calc): 36 mg/dL (calc)
Non-HDL Cholesterol (Calc): 55 mg/dL (calc) (ref <130)
Total CHOL/HDL Ratio: 2.4 (calc) (ref <5.0)
Triglycerides: 103 mg/dL (ref <150)

## 2022-11-30 LAB — CBC WITH DIFFERENTIAL/PLATELET
Absolute Monocytes: 757 cells/uL (ref 200–950)
Basophils Absolute: 80 cells/uL (ref 0–200)
Basophils Relative: 0.9 %
Eosinophils Absolute: 605 cells/uL — ABNORMAL HIGH (ref 15–500)
Eosinophils Relative: 6.8 %
HCT: 39.3 % (ref 35.0–45.0)
Hemoglobin: 12.7 g/dL (ref 11.7–15.5)
Lymphs Abs: 2866 cells/uL (ref 850–3900)
MCH: 28.1 pg (ref 27.0–33.0)
MCHC: 32.3 g/dL (ref 32.0–36.0)
MCV: 86.9 fL (ref 80.0–100.0)
MPV: 11.3 fL (ref 7.5–12.5)
Monocytes Relative: 8.5 %
Neutro Abs: 4592 cells/uL (ref 1500–7800)
Neutrophils Relative %: 51.6 %
Platelets: 261 10*3/uL (ref 140–400)
RBC: 4.52 10*6/uL (ref 3.80–5.10)
RDW: 12.5 % (ref 11.0–15.0)
Total Lymphocyte: 32.2 %
WBC: 8.9 10*3/uL (ref 3.8–10.8)

## 2022-11-30 LAB — HEMOGLOBIN A1C
Hgb A1c MFr Bld: 6.3 % of total Hgb — ABNORMAL HIGH (ref <5.7)
Mean Plasma Glucose: 134 mg/dL
eAG (mmol/L): 7.4 mmol/L

## 2022-11-30 LAB — TSH: TSH: 1.64 mIU/L (ref 0.40–4.50)

## 2022-12-01 ENCOUNTER — Other Ambulatory Visit: Payer: Medicare Other

## 2022-12-02 NOTE — Progress Notes (Signed)
Thanks for seeing her for [facility]

## 2022-12-06 ENCOUNTER — Telehealth: Payer: Self-pay | Admitting: Internal Medicine

## 2022-12-06 ENCOUNTER — Encounter: Payer: Self-pay | Admitting: Internal Medicine

## 2022-12-06 ENCOUNTER — Ambulatory Visit (INDEPENDENT_AMBULATORY_CARE_PROVIDER_SITE_OTHER): Payer: Medicare Other | Admitting: Internal Medicine

## 2022-12-06 VITALS — BP 98/62 | HR 94 | Temp 98.3°F | Ht 63.5 in | Wt 291.0 lb

## 2022-12-06 DIAGNOSIS — F419 Anxiety disorder, unspecified: Secondary | ICD-10-CM

## 2022-12-06 DIAGNOSIS — E1169 Type 2 diabetes mellitus with other specified complication: Secondary | ICD-10-CM

## 2022-12-06 DIAGNOSIS — Z8639 Personal history of other endocrine, nutritional and metabolic disease: Secondary | ICD-10-CM

## 2022-12-06 DIAGNOSIS — E785 Hyperlipidemia, unspecified: Secondary | ICD-10-CM

## 2022-12-06 DIAGNOSIS — F32A Depression, unspecified: Secondary | ICD-10-CM

## 2022-12-06 DIAGNOSIS — M170 Bilateral primary osteoarthritis of knee: Secondary | ICD-10-CM

## 2022-12-06 DIAGNOSIS — E8940 Asymptomatic postprocedural ovarian failure: Secondary | ICD-10-CM | POA: Diagnosis not present

## 2022-12-06 DIAGNOSIS — Z86711 Personal history of pulmonary embolism: Secondary | ICD-10-CM

## 2022-12-06 DIAGNOSIS — I2722 Pulmonary hypertension due to left heart disease: Secondary | ICD-10-CM

## 2022-12-06 DIAGNOSIS — G4733 Obstructive sleep apnea (adult) (pediatric): Secondary | ICD-10-CM

## 2022-12-06 DIAGNOSIS — E1100 Type 2 diabetes mellitus with hyperosmolarity without nonketotic hyperglycemic-hyperosmolar coma (NKHHC): Secondary | ICD-10-CM | POA: Diagnosis not present

## 2022-12-06 DIAGNOSIS — Z8679 Personal history of other diseases of the circulatory system: Secondary | ICD-10-CM

## 2022-12-06 DIAGNOSIS — E039 Hypothyroidism, unspecified: Secondary | ICD-10-CM

## 2022-12-06 DIAGNOSIS — Z7901 Long term (current) use of anticoagulants: Secondary | ICD-10-CM

## 2022-12-06 DIAGNOSIS — Z0000 Encounter for general adult medical examination without abnormal findings: Secondary | ICD-10-CM | POA: Diagnosis not present

## 2022-12-06 DIAGNOSIS — Z90710 Acquired absence of both cervix and uterus: Secondary | ICD-10-CM

## 2022-12-06 DIAGNOSIS — I10 Essential (primary) hypertension: Secondary | ICD-10-CM

## 2022-12-06 DIAGNOSIS — Z6841 Body Mass Index (BMI) 40.0 and over, adult: Secondary | ICD-10-CM

## 2022-12-06 NOTE — Telephone Encounter (Signed)
[name] is past due for cardiology because she missed her last appointment in November, she needs to call and schedule appointments and she can choose who she wants since Dr Pernell Dupre is leaving according to my contacts at cardiology.  According to last pulmonology note she is to come back in 6 weeks around 01/02/2023, however they were also doing some testing on 11/29/2022, do not know if they have contacted her with those results.  I will call her and have her call for these appointments

## 2022-12-06 NOTE — Telephone Encounter (Signed)
Called patient and let her know she needs to call cardiology and pulmonology to schedule appointments and that her prescription for her diabetic supplies has been faxed to her pharmacy [facility], Patient verbalized understanding

## 2022-12-06 NOTE — Patient Instructions (Addendum)
Try checking glucose before breakfast and supper for 10-14 days. Rx written for glucose monitor and test strips. We will call Pulmonary about follow up with Dr. Silas Flood. We will call [facility]iology about follow up. RTC in 6 months.  [name] says all she has to do is call Pulmonary Office and Cardiology Office. She can make own appt for Dr. Silas Flood and can choose any cardiologist she desires

## 2022-12-06 NOTE — Progress Notes (Addendum)
Annual Wellness Visit     Patient: [name], Female    DOB: 23-Aug-1951, 72 y.o.   MRN: [id number] Visit Date: 12/06/22   Chief Complaint  Patient presents with   Medicare Wellness   Subjective    [name] is a 72 y.o. female who presents today for her Annual Wellness Visit.  She has established care with pulmonology Dr. Larey Days for her chronic cough and last saw him on 11/21/22. She had a CT chest high resolution scan and nuclear pulmonary perfusion scan done on 11/29/22. She has not yet followed up with Dr. Silas Flood for review of these results. She reminds me that her cough has occurred daily since the Summer of 2023. She states that her cough sounds wet but she is not able to produce any sputum. Her cough is most severe in the mornings and with exertion. She has been using her Albuterol inhaler with minimal relief. She has seen home O2 saturation as low at 84% during her coughing fits. She was not started on any other inhalers with Dr. Silas Flood. She was advised to follow up in 6-8 weeks but this appointment has not yet been made.  She states that she has not been using her CPAP for a long time as she did not tolerate the mask. Patient notes that Dr. Silas Flood reviewed alternative options for OSA treatment as well.  In regards ot her blood sugars, she has not been as active for the last x1 year due to her cough. Her last A1c was 6.3 on 11/29/22. She does not check her blood sugar at home. She is agreeable to starting a medication to improve her sugars if needed.  She was followed by Dr. Daneen Schick but the clinic is working to establish alterate cardiology care for her as he is planning to retire. She was last evaluated by NP Ambrose Pancoast on 08/29/22.  Her last lipid panel was done on 11/29/22 and revealed a total cholesterol of 95, HDL of 40, TRIG of 103, and LDL of 36.   Lab Results  Component Value Date   CHOL 95 11/29/2022   HDL 40 (L)  11/29/2022   LDLCALC 36 11/29/2022   TRIG 103 11/29/2022   CHOLHDL 2.4 11/29/2022      Family History  Problem Relation Age of Onset   Heart disease Mother    Kidney failure Father    Diabetes Father    Breast cancer Paternal Aunt    Colon cancer Neg Hx      Past Medical History:  Diagnosis Date   Anemia    none recently   Anxiety    Arthritis    Asthma    Complete tear of right rotator cuff 12/05/2014   Coronary artery disease    Depression    Diabetes mellitus    Last A1C was 6.2 on 09/18/2020   Diverticulitis    Diverticulosis    Family history of adverse reaction to anesthesia    mom  nausea and vomiting   GERD (gastroesophageal reflux disease)    History of kidney stones    seen on scan, has not been a problem.   Hypertension    Hypothyroidism    Migraine    Personal history of kidney stones    Pneumonia    hx   Pulmonary embolism ([place])    a. diagnosed in 01/2017 w/ imaging showing multiple right lung PE with right heart strain. Started on Xarelto.  Shortness of breath dyspnea    exersion   Thyroid disease    hypo   Wears glasses      Social History   Social History Narrative   Social history: She is divorced.  Does not smoke.  Occasional alcohol consumption.  Longstanding history of morbid obesity.  Weight in 1996 was 307 pounds.  She retired as Forensic psychologist at [employer].       Family history: Father deceased with history of stroke, hypertension, diabetes and COPD.  Mother deceased with history of congestive heart failure and chronic kidney disease.  1 sister and 1 son in good health.    Patient Care Team: Elby Showers, MD as PCP - General (Internal Medicine) Belva Crome, MD as PCP - Cardiology (Cardiology) Jackolyn Confer, MD as Consulting Physician (General Surgery) Gatha Mayer, MD as Consulting Physician (Gastroenterology)  Review of Systems  Constitutional:  Negative for fever.  HENT:  Negative for  congestion.   Respiratory:  Positive for cough. Negative for shortness of breath.   Cardiovascular:  Negative for chest pain and palpitations.  Gastrointestinal:  Negative for abdominal pain and nausea.  Genitourinary:  Negative for dysuria and frequency.  Skin:  Negative for rash.  Allergic/Immunologic: Negative for environmental allergies.  Neurological:  Negative for headaches.  Psychiatric/Behavioral:  The patient is not nervous/anxious.      Objective    Vitals: BP 98/62   Pulse 94   Temp 98.3 F (36.8 C) (Tympanic)   Ht 5' 3.5" (1.613 m)   Wt 291 lb (132 kg)   SpO2 100%   BMI 50.74 kg/m   Physical Exam Constitutional:      General: She is not in acute distress.    Appearance: Normal appearance. She is not ill-appearing.  HENT:     Head: Normocephalic and atraumatic.     Right Ear: Tympanic membrane, ear canal and external ear normal.     Left Ear: Tympanic membrane, ear canal and external ear normal.  Eyes:     Extraocular Movements: Extraocular movements intact.     Pupils: Pupils are equal, round, and reactive to light.  Neck:     Thyroid: No thyromegaly or thyroid tenderness.     Vascular: No carotid bruit.  Cardiovascular:     Rate and Rhythm: Normal rate and regular rhythm.     Heart sounds: Normal heart sounds. No murmur heard.    No gallop.  Pulmonary:     Effort: Pulmonary effort is normal. No respiratory distress.     Breath sounds: Normal breath sounds. No wheezing or rales.  Abdominal:     General: Bowel sounds are normal. There is no distension.     Palpations: Abdomen is soft. There is no hepatomegaly, splenomegaly or mass.     Tenderness: There is no abdominal tenderness. There is no guarding.  Musculoskeletal:     Right lower leg: No edema.     Left lower leg: No edema.  Lymphadenopathy:     Cervical: No cervical adenopathy.  Skin:    General: Skin is warm and dry.  Neurological:     Mental Status: She is alert and oriented to person,  place, and time.  Psychiatric:        Judgment: Judgment normal.     Studies were personally reviewed by me:   NM Pulmonary Perfusion scan 11/29/22: IMPRESSION: Shallow peripheral perfusion defects suggest COPD versus chronic pulmonary emboli. No evidence of wedge-shaped segmental defects to  suggest acute pulmonary emboli.    CT Chest high resolution 11/29/22: IMPRESSION: 1. Mild pulmonary fibrosis in a pattern apical to basal gradient, featuring irregular peripheral interstitial opacity, septal thickening, associated ground-glass, and small areas of subpleural bronchiolectasis at the bilateral lung bases. No clear evidence of honeycombing. Findings are categorized as probable UIP per consensus guidelines: Diagnosis of Idiopathic Pulmonary Fibrosis: An Official ATS/ERS/JRS/ALAT Clinical Practice Guideline. [place], Iss 5, [phone], Jul 15 2017. 2. Gross enlargement of the main pulmonary artery, as can be seen in pulmonary hypertension. 3. Coronary artery disease.    Echocardiogram 10/20/2022: 1. Left ventricular ejection fraction, by estimation, is 60 to 65%. Left  ventricular ejection fraction by 2D MOD biplane is 64.4 %. The left  ventricle has normal function. The left ventricle has no regional wall  motion abnormalities. Left ventricular  diastolic parameters are consistent with Grade I diastolic dysfunction  (impaired relaxation). There is the interventricular septum is flattened  in systole and diastole, consistent with right ventricular pressure and  volume overload.   2. Right ventricular systolic function is normal. The right ventricular  size is normal. There is severely elevated pulmonary artery systolic  pressure. The estimated right ventricular systolic pressure is 24.5 mmHg.   3. The mitral valve is grossly normal. No evidence of mitral valve  regurgitation.   4. The aortic valve is tricuspid. Aortic valve regurgitation is not   visualized.   5. The inferior vena cava is normal in size with greater than 50%  respiratory variability, suggesting right atrial pressure of 3 mmHg.    Most recent functional status assessment:    12/06/2022    3:06 PM  In your present state of health, do you have any difficulty performing the following activities:  Hearing? 0  Vision? 0  Difficulty concentrating or making decisions? 0  Walking or climbing stairs? 1  Dressing or bathing? 0  Doing errands, shopping? 0  Preparing Food and eating ? N  Using the Toilet? N  In the past six months, have you accidently leaked urine? Y  Do you have problems with loss of bowel control? Y  Managing your Medications? N  Managing your Finances? N  Housekeeping or managing your Housekeeping? N   Most recent fall risk assessment:    12/06/2022    3:05 PM  [place] in the past year? 0  Number falls in past yr: 0  Injury with Fall? 0  Risk for fall due to : No Fall Risks  Follow up Falls prevention discussed    Most recent depression screenings:    12/06/2022    3:05 PM 10/04/2022   10:45 AM  PHQ 2/9 Scores  PHQ - 2 Score 2 0  PHQ- 9 Score 2    Most recent cognitive screening:    12/06/2022    3:08 PM  6CIT Screen  What Year? 0 points  What month? 0 points  What time? 0 points  Count back from 20 0 points  Months in reverse 0 points  Repeat phrase 0 points  Total Score 0 points       Assessment & Plan   History of  coronary disease with LAD stenosis 60 to 70%: Followed by cardiology.   Type 2 diabetes mellitus: Last A1c was 6.3 on 11/29/22. Advised her to start checking blood sugars at home.   Hypothyroidism: TSH of 1.64 on 11/29/22. Continue Synthroid 218mg daily.   GE reflux: Well  controlled with Pepcid.   Hypertension: Blood pressures well controlled. Continue Lopressor '25mg'$  daily, Benicar '40mg'$  daily.  Hyperlipidemia: Continue Lipitor '80mg'$  daily.    History of pulmonary embolus March 2017 treated  with Xarelto   Obstructive sleep apnea treated with CPAP: She is not compliant with CPAP use.   History of pulmonary hypertension: Followed by pulmonology Dr. Larey Days. CT chest high res and NM pulmonary perfusion studies performed on 11/29/22 as noted above.   History of diastolic congestive heart failure: Grade I LVDD noted on echo from 10/20/22. Blood pressures have been well controlled. Followed by cardiology. Will contact their office to discuss establishing care with a new provider.  Chronic cough: O2 sat on arrival was 100%. However, after a coughing fit during her visit, her O2 saturation dropped to 92%. Followed by Dr. Silas Flood. Will contact their office to schedule next visit.  Health Maintenance: She is UTD on mammograms, last was on 04/13/22.  Vaccine Counseling: She is UTD with her immunizations. She plans to get the RSV vaccine at her pharmacy.      Annual wellness visit done today including the all of the following: Reviewed patient's Family Medical History Reviewed and updated list of patient's medical providers Assessment of cognitive impairment was done Assessed patient's functional ability Established a written schedule for health screening [place] Completed and Reviewed  Discussed health benefits of physical activity, and encouraged her to engage in regular exercise appropriate for her age and condition.        Follow up here in 6 months   I,Alexis Herring,acting as a scribe for Elby Showers, MD.,have documented all relevant documentation on the behalf of Elby Showers, MD,as directed by  Elby Showers, MD while in the presence of Elby Showers, MD.   I, Elby Showers, MD, have reviewed all documentation for this visit. The documentation on 12/06/22 for the exam, diagnosis, procedures, and orders are all accurate and complete.   IElby Showers, MD, have reviewed all documentation for this visit. The documentation on 12/13/22  for the exam, diagnosis, procedures, and orders are all accurate and complete.IElby Showers, MD, have reviewed all documentation for this visit. The documentation on 12/13/22 for the exam, diagnosis, procedures, and orders are all accurate and complete.

## 2022-12-07 LAB — MICROALBUMIN / CREATININE URINE RATIO
Creatinine, Urine: 71 mg/dL (ref 20–275)
Microalb Creat Ratio: 3 mcg/mg creat (ref <30)
Microalb, Ur: 0.2 mg/dL

## 2022-12-19 ENCOUNTER — Other Ambulatory Visit: Payer: Self-pay

## 2022-12-19 MED ORDER — ATORVASTATIN CALCIUM 80 MG PO TABS: 80.0000 mg | ORAL_TABLET | Freq: Every day | ORAL | 2 refills | Status: DC

## 2022-12-20 ENCOUNTER — Ambulatory Visit (INDEPENDENT_AMBULATORY_CARE_PROVIDER_SITE_OTHER): Payer: Medicare Other | Admitting: Pulmonary Disease

## 2022-12-20 ENCOUNTER — Encounter: Payer: Self-pay | Admitting: Pulmonary Disease

## 2022-12-20 VITALS — BP 128/64 | HR 69 | Wt 291.2 lb

## 2022-12-20 DIAGNOSIS — J45909 Unspecified asthma, uncomplicated: Secondary | ICD-10-CM | POA: Diagnosis not present

## 2022-12-20 DIAGNOSIS — G4733 Obstructive sleep apnea (adult) (pediatric): Secondary | ICD-10-CM

## 2022-12-20 MED ORDER — FLUTICASONE-SALMETEROL 500-50 MCG/ACT IN AEPB: 1.0000 | INHALATION_SPRAY | Freq: Two times a day (BID) | RESPIRATORY_TRACT | 3 refills | Status: DC

## 2022-12-20 NOTE — Patient Instructions (Addendum)
Nice to see you again   Try Advair diskus 1 puff twice a day - rinse out your mouth after every use  I hope this helps with the cough and shortness of breath  If any ill effects such as mouth sores or worsening cough with this medication let me know  We should consider using medicines to slow any scarring or fibrosis in the lung  Return to clinic in 2 months or sooner as needed

## 2022-12-20 NOTE — Progress Notes (Signed)
$'@Patient'c$  ID: [name], female    DOB: 1951/10/31, 72 y.o.   MRN: [id number]  Chief Complaint  Patient presents with   Follow-up    Pt is here for follow up for pulmonary embolism. Pt had perfusion scan on 11/29/22, CT scan on 11/29/22 and echocardiogram in December. Pt is here to go over results with MH     Referring provider: Elby Showers, MD  HPI:   72 y.o. woman whom we are seeing in evaluation of pulmonary hypertension and DOE.    In the interim, VQ scan was obtained for further evaluation of pulmonary hypertension in the setting of prior PE in the past.  This was low risk or no signs of chronic PE.  CT high-resolution was also obtained which showed some very mild basilar peripheral linear infiltrate with improvement but persistence on prone imaging suspicious for early mild fibrosis without evidence of honeycombing on my review and interpretation.  We discussed at length the results of this finding.  She is.  Her concern about pulmonary fibrosis.  We discussed at length the treatment for this.  We also discussed at length pulmonary hypertension, further evaluation, risk of therapy etc.  Discussed the implications of discovery of pulmonary fibrosis as well as it pertains top possible therapy of pulmonary hypertension.  She still has a cough.  HPI at initial visit: Patient first mentions cough today.  Started what sounds like viral illness.  Cough lingered.  Was given Solu-Medrol shot, prednisone taper, sounds like antibiotics.  Cough markedly improved.  Although somewhat persists.  She think steroids is most beneficial.  In general, she thinks her asthma is well-controlled.  Her as needed albuterol has not really affected the cough positively or negatively, not really any improvement or change.  Reason for referral was pulmonary hypertension.  She had a submassive PE presumably submassive PE 01/2017 with TTE demonstrating RV dysfunction and dilated RV.  It is possible that  was chronic and predated this.  On repeat TTE 01/2019 findings were similar with back the question of either chronic thromboembolic disease or preceding pulmonary hypertension that was just discovered in the setting of pulmonary embolus.  She had left heart catheterization in 2020 with mildly elevated LVEDP to 17.  She had a sleep test in 2020 that demonstrated mild apnea.  She is not able to tolerate CPAP therapy.  Not using any therapies for OSA.  Her most recent TTE 10/2022 is largely unchanged, RV dysfunction, dilated RV, dilated RA, largely unchanged dating back to 2018, 5-1/2 years prior.  We discussed the underlying etiologies of possible pulmonary hypertension in general and as it pertains to her specific case.  We discussed the role and rationale of additional diagnostic testing up to and including formal diagnosis of port after the right heart cath.  Discussed the role and rationale for therapeutic options, the risk and benefits, adverse events etc. with pulmonary vasodilators.  Questionaires / Pulmonary Flowsheets:   ACT:      No data to display           MMRC:     No data to display           Epworth:     10/22/2018   11:00 AM  Results of the Epworth flowsheet  Sitting and reading 0  Watching TV 1  Sitting, inactive in a public place (e.g. a theatre or a meeting) 0  As a passenger in a car for an hour without a break 0  Lying down to rest in the afternoon when circumstances permit 3  Sitting and talking to someone 0  Sitting quietly after a lunch without alcohol 0  In a car, while stopped for a few minutes in traffic 0  Total score 4    Tests:   FENO:  No results found for: "NITRICOXIDE"  PFT:     No data to display           WALK:     10/11/2022    1:48 PM  SIX MIN WALK  Supplimental Oxygen during Test? (L/min) No  Tech Comments: Pt was unable to complete 3 laps. She stated she was tired after 2 laps. Pt walked at a regular pace without stopping  or complaining of SOB for 2 laps.    Imaging: Personally reviewed and as per EMR discussion in this note DG Chest 2 View  Result Date: 11/29/2022 CLINICAL DATA:  Correlation for radionuclide perfusion scan. EXAM: CHEST - 2 VIEW COMPARISON:  11/21/2022 FINDINGS: The heart size and mediastinal contours are within normal limits. Both lungs are clear. The visualized skeletal structures are unremarkable. IMPRESSION: No active cardiopulmonary disease. Electronically Signed   By: Lucienne Capers M.D.   On: 11/29/2022 23:27   CT Chest High Resolution  Result Date: 11/29/2022 CLINICAL DATA:  Evaluate interstitial lung disease probable pulmonary hypertension, increasing shortness of breath and history of pulmonary embolism EXAM: CT CHEST WITHOUT CONTRAST TECHNIQUE: Multidetector CT imaging of the chest was performed following the standard protocol without intravenous contrast. High resolution imaging of the lungs, as well as inspiratory and expiratory imaging, was performed. RADIATION DOSE REDUCTION: This exam was performed according to the departmental dose-optimization program which includes automated exposure control, adjustment of the mA and/or kV according to patient size and/or use of iterative reconstruction technique. COMPARISON:  11/04/2022 FINDINGS: Cardiovascular: Scattered aortic atherosclerosis. Normal heart size. Three-vessel coronary artery calcifications. Gross enlargement of the main pulmonary artery measuring up to 4.1 cm in caliber. No pericardial effusion. Mediastinum/Nodes: No enlarged mediastinal, hilar, or axillary lymph nodes. Thyroid gland, trachea, and esophagus demonstrate no significant findings. Lungs/Pleura: Mild pulmonary fibrosis in a pattern apical to basal gradient, featuring irregular peripheral interstitial opacity, septal thickening, associated ground-glass, and small areas of subpleural bronchiolectasis at the bilateral lung bases. No clear evidence of honeycombing. No  significant air trapping on expiratory phase imaging. No pleural effusion or pneumothorax. Upper Abdomen: No acute abnormality. Musculoskeletal: No chest wall abnormality. No acute osseous findings. IMPRESSION: 1. Mild pulmonary fibrosis in a pattern apical to basal gradient, featuring irregular peripheral interstitial opacity, septal thickening, associated ground-glass, and small areas of subpleural bronchiolectasis at the bilateral lung bases. No clear evidence of honeycombing. Findings are categorized as probable UIP per consensus guidelines: Diagnosis of Idiopathic Pulmonary Fibrosis: An Official ATS/ERS/JRS/ALAT Clinical Practice Guideline. [place], Iss 5, [id number], Jul 15 2017. 2. Gross enlargement of the main pulmonary artery, as can be seen in pulmonary hypertension. 3. Coronary artery disease. Aortic Atherosclerosis (ICD10-I70.0). Electronically Signed   By: Delanna Ahmadi M.D.   On: 11/29/2022 15:38   NM Pulmonary Perfusion  Result Date: 11/29/2022 CLINICAL DATA:  72 year old female with pulmonary hypertension. No acute respiratory symptoms. EXAM: NUCLEAR MEDICINE PERFUSION LUNG SCAN TECHNIQUE: Perfusion images were obtained in multiple projections after intravenous injection of radiopharmaceutical. RADIOPHARMACEUTICALS:  3.75 mCi Tc-15mMAA COMPARISON:  CT 11/29/2022 FINDINGS: There several peripheral shallow perfusion defect in LEFT or RIGHT lung. These have a scalloped appearance. No wedge-shaped  peripheral perfusion segmental defect. IMPRESSION: Shallow peripheral perfusion defects suggest COPD versus chronic pulmonary emboli. No evidence of wedge-shaped segmental defects to suggest acute pulmonary emboli. Patient in no respiratory distress. Electronically Signed   By: Suzy Bouchard M.D.   On: 11/29/2022 12:43   DG Chest 2 View  Result Date: 11/22/2022 CLINICAL DATA:  Exam prior to perfusion lung imaging. Pulmonary hypertension. EXAM: CHEST - 2 VIEW COMPARISON:   08/29/2022.  CT, 11/04/2022. FINDINGS: Cardiac silhouette is normal in size and configuration. Normal mediastinal and hilar contours. Mild interstitial thickening in the lower lungs peripherally. Lungs otherwise clear. No pleural effusion or pneumothorax. Skeletal structures are intact. IMPRESSION: No active cardiopulmonary disease. Electronically Signed   By: Lajean Manes M.D.   On: 11/22/2022 13:36    Lab Results: Personally reviewed CBC    Component Value Date/Time   WBC 8.9 11/29/2022 0943   RBC 4.52 11/29/2022 0943   HGB 12.7 11/29/2022 0943   HGB 11.9 01/23/2019 0910   HGB 12.9 09/05/2006 1321   HCT 39.3 11/29/2022 0943   HCT 37.7 01/23/2019 0910   HCT 38.2 09/05/2006 1321   PLT 261 11/29/2022 0943   PLT 348 01/23/2019 0910   MCV 86.9 11/29/2022 0943   MCV 87 01/23/2019 0910   MCV 85.1 09/05/2006 1321   MCH 28.1 11/29/2022 0943   MCHC 32.3 11/29/2022 0943   RDW 12.5 11/29/2022 0943   RDW 13.4 01/23/2019 0910   RDW 13.6 09/05/2006 1321   LYMPHSABS 2,866 11/29/2022 0943   LYMPHSABS 2.7 01/23/2019 0910   LYMPHSABS 3.1 09/05/2006 1321   MONOABS 2.3 (H) 01/15/2019 0355   MONOABS 0.7 09/05/2006 1321   EOSABS 605 (H) 11/29/2022 0943   EOSABS 0.6 (H) 01/23/2019 0910   BASOSABS 80 11/29/2022 0943   BASOSABS 0.1 01/23/2019 0910   BASOSABS 0.0 09/05/2006 1321    BMET    Component Value Date/Time   NA 141 11/29/2022 0943   NA 141 09/13/2022 1208   K 4.6 11/29/2022 0943   CL 103 11/29/2022 0943   CO2 30 11/29/2022 0943   GLUCOSE 128 (H) 11/29/2022 0943   BUN 16 11/29/2022 0943   BUN 22 09/13/2022 1208   CREATININE 1.11 (H) 11/29/2022 0943   CALCIUM 9.5 11/29/2022 0943   GFRNONAA 48 (L) 04/27/2021 1240   GFRAA 56 (L) 04/27/2021 1240    BNP    Component Value Date/Time   BNP 29 10/04/2022 1026    ProBNP    Component Value Date/Time   PROBNP 67.0 04/12/2008 0500    Specialty Problems       Pulmonary Problems   Asthma    Qualifier: Diagnosis of  By:  Hardin Negus CMA (AAMA), [place]        Allergic rhinitis   OSA (obstructive sleep apnea)    Allergies  Allergen Reactions   Aspirin Shortness Of Breath   Lisinopril Anaphylaxis and Other (See Comments)    Possibly Lisinopril or Levaquin, pt was taking both at the time of reaction   Bee Venom Swelling   Morphine And Related Itching and Other (See Comments)    Needs benadryl prior to     Immunization History  Administered Date(s) Administered   Influenza Split 08/01/2013   Influenza, High Dose Seasonal PF 09/01/2017, 08/16/2018, 08/16/2019, 08/07/2020, 09/09/2022   Influenza-Unspecified 08/09/2012, 09/30/2015, 07/02/2016, 08/16/2019, 08/19/2021   Moderna Covid-19 Vaccine Bivalent Booster 62yr & up 08/19/2021, 09/09/2022   Moderna Sars-Covid-2 Vaccination 01/10/2020, 02/07/2020, 09/16/2020   PNEUMOCOCCAL CONJUGATE-20 05/24/2022   Pneumococcal  Conjugate-13 10/19/2015, 07/20/2017   Pneumococcal Polysaccharide-23 05/06/1999, 09/16/2019   Zoster, Live 08/09/2012    Past Medical History:  Diagnosis Date   Anemia    none recently   Anxiety    Arthritis    Asthma    Complete tear of right rotator cuff 12/05/2014   Coronary artery disease    Depression    Diabetes mellitus    Last A1C was 6.2 on 09/18/2020   Diverticulitis    Diverticulosis    Family history of adverse reaction to anesthesia    mom  nausea and vomiting   GERD (gastroesophageal reflux disease)    History of kidney stones    seen on scan, has not been a problem.   Hypertension    Hypothyroidism    Migraine    Personal history of kidney stones    Pneumonia    hx   Pulmonary embolism ([place])    a. diagnosed in 01/2017 w/ imaging showing multiple right lung PE with right heart strain. Started on Xarelto.    Shortness of breath dyspnea    exersion   Thyroid disease    hypo   Wears glasses     Tobacco History: Social History   Tobacco Use  Smoking Status Former   Packs/day: 0.50   Years: 6.00   Total  pack years: 3.00   Types: Cigarettes   Quit date: 11/14/1982   Years since quitting: 40.1  Smokeless Tobacco Never  Tobacco Comments   quit smoking 30 years ago   Counseling given: Not Answered Tobacco comments: quit smoking 30 years ago   Continue to not smoke  Outpatient Encounter Medications as of 12/20/2022  Medication Sig   acetaminophen (TYLENOL) 500 MG tablet Take 500 mg by mouth every 6 (six) hours as needed.   albuterol (VENTOLIN HFA) 108 (90 Base) MCG/ACT inhaler INHALE 2 PUFFS INTO THE LUNGS EVERY 6 (SIX) HOURS AS NEEDED FOR WHEEZING OR SHORTNESS OF BREATH   ALPRAZolam (XANAX) 0.5 MG tablet TAKE 1 TABLET BY MOUTH TWICE A DAY AS NEEDED   atorvastatin (LIPITOR) 80 MG tablet Take 1 tablet (80 mg total) by mouth daily at 6 PM.   cetirizine (ZYRTEC) 10 MG tablet Take 10 mg by mouth at bedtime.   Cholecalciferol (VITAMIN D3) 5000 units CAPS Take 5,000 Units by mouth daily.   clobetasol ointment (TEMOVATE) 3.71 % Apply 1 application topically 2 (two) times daily as needed (skin irritation).   EPINEPHrine 0.3 mg/0.3 mL IJ SOAJ injection Inject 0.3 mLs (0.3 mg total) into the muscle as needed for anaphylaxis.   ergocalciferol (DRISDOL) 1.25 MG (50000 UT) capsule Take 1 capsule (50,000 Units total) by mouth once a week.   escitalopram (LEXAPRO) 10 MG tablet TAKE 1 TABLET BY MOUTH EVERY DAY   famotidine (PEPCID) 10 MG tablet Take 10 mg by mouth at bedtime.   fluticasone-salmeterol (ADVAIR) 500-50 MCG/ACT AEPB Inhale 1 puff into the lungs in the morning and at bedtime.   furosemide (LASIX) 20 MG tablet Take 1 tablet (20 mg total) by mouth 2 (two) times daily.   levothyroxine (SYNTHROID) 200 MCG tablet TAKE 1 TABLET BY MOUTH EVERY DAY ON EMPTY STOMACH BEFORE BREAKFAST   magnesium oxide (MAG-OX) 400 MG tablet Take 1 tablet by mouth on Tuesdays, Thursdays, Saturdays, & Sundays.  Take 1 tablet by mouth twice a day on Mondays, Wednesdays, & Fridays (Patient taking differently: Take 400 mg by  mouth See admin instructions. Take 400 mg by mouth once daily on Tuesdays, Thursdays,  Saturdays, & Sundays and take 400 mg twice a day on Mondays, Wednesdays, & Fridays)   metoprolol tartrate (LOPRESSOR) 25 MG tablet TAKE 1 TABLET BY MOUTH EVERY DAY IN THE MORNING   montelukast (SINGULAIR) 10 MG tablet TAKE 1 TABLET BY MOUTH EVERYDAY AT BEDTIME   Multiple Vitamins-Minerals (PRESERVISION AREDS 2 PO) Take 1 capsule by mouth daily.   olmesartan (BENICAR) 40 MG tablet TAKE 1 TABLET BY MOUTH EVERY DAY   traZODone (DESYREL) 150 MG tablet TAKE 1 TABLET BY MOUTH EVERYDAY AT BEDTIME   XARELTO 20 MG TABS tablet TAKE 1 TABLET BY MOUTH DAILY WITH SUPPER   zolpidem (AMBIEN) 10 MG tablet TAKE 1 TABLET BY MOUTH EVERY DAY AT BEDTIME AS NEEDED   nitroGLYCERIN (NITROSTAT) 0.4 MG SL tablet Place 1 tablet (0.4 mg total) under the tongue every 5 (five) minutes as needed for chest pain.   No facility-administered encounter medications on file as of 12/20/2022.     Review of Systems  Review of Systems  N/a Physical Exam  BP 128/64 (BP Location: Left Arm, Patient Position: Sitting, Cuff Size: Normal)   Pulse 69   Wt 291 lb 3.2 oz (132.1 kg)   SpO2 92%   BMI 50.77 kg/m   Wt Readings from Last 5 Encounters:  12/20/22 291 lb 3.2 oz (132.1 kg)  12/06/22 291 lb (132 kg)  11/21/22 288 lb 9.6 oz (130.9 kg)  10/11/22 291 lb 3.2 oz (132.1 kg)  08/29/22 296 lb (134.3 kg)    BMI Readings from Last 5 Encounters:  12/20/22 50.77 kg/m  12/06/22 50.74 kg/m  11/21/22 50.32 kg/m  10/11/22 50.77 kg/m  08/29/22 51.21 kg/m     Physical Exam General: Sitting in chair, no acute distress Eyes: EOMI, no icterus Neck: Supple, no JVP appreciated Pulmonary: Distant, clear Cardiovascular: Tachycardic, regular rhythm Abdomen: Nondistended, bowel sounds present MSK: No synovitis, no joint effusion Neuro: Normal gait with assistance of rollator, no weakness Psych: Normal mood, full affect   Assessment & Plan:    Presumed pulmonary hypertension: Based on serial echocardiograms, signs of pulmonary hypertension and RV dysfunction dating back to 01/2017.  This was during the time of acute PE.  Unchanged in 01/2019.  Largely unchanged 10/2022, most recently.  Possible etiologies include group 3 disease in the setting of OHS/OSA and nonadherence to CPAP as well as mild interstitial changes on CT hi res 11/2022. Group 4 disease felt unlikely given normal VQ scan 11/2022, notably does have a history of PE in 2018. Group 1 disease, PAH, is always possible.  Given discovery of pulmonary fibrosis, systemic vasodilators probably not in her best interest.  Difficult to tell how much it will help her in general in terms of inhaled vasodilators.  Given her aversion to CPAP therapies, do not recommend treatment for pulmonary hypertension with medications without treatment of OSA.  OSA: Based on home sleep test 2020.  Not adherent to CPAP.  Discussed role of dental evaluation.  She is precontemplative.  Discussed we will need to treat OSA in order to effectively treat presumed pulmonary hypertension.  Cough: Worsened over last couple months.  Markedly improved although not resolved after steroid therapy.  Suspect related to asthma, cough variant asthma.  High-dose Advair discus prescribed today, see if helps with cough and shortness of breath.  ILD: Very mild on imaging, faint linear basilar opacity persist on prone imaging.  IPF favored.  Discussed role and rationale for antifibrotic therapy.  Suspect will start in the coming months.  Her  prior days to have cough improved.  Could be related to ILD as well.  Treating asthma as above.   Return in about 2 months (around 02/18/2023).   Lanier Clam, MD 12/20/2022   This appointment required 43 minutes of patient care (this includes precharting, chart review, review of results, face-to-face care, etc.).

## 2023-01-05 ENCOUNTER — Other Ambulatory Visit: Payer: Self-pay | Admitting: Internal Medicine

## 2023-01-23 ENCOUNTER — Other Ambulatory Visit: Payer: Self-pay | Admitting: Internal Medicine

## 2023-02-20 ENCOUNTER — Encounter: Payer: Self-pay | Admitting: Pulmonary Disease

## 2023-02-20 ENCOUNTER — Ambulatory Visit (INDEPENDENT_AMBULATORY_CARE_PROVIDER_SITE_OTHER): Payer: Medicare Other | Admitting: Pulmonary Disease

## 2023-02-20 VITALS — HR 63 | Wt 292.8 lb

## 2023-02-20 DIAGNOSIS — J4540 Moderate persistent asthma, uncomplicated: Secondary | ICD-10-CM

## 2023-02-20 MED ORDER — FLUTICASONE-SALMETEROL 500-50 MCG/ACT IN AEPB: 1.0000 | INHALATION_SPRAY | Freq: Two times a day (BID) | RESPIRATORY_TRACT | 6 refills | Status: DC

## 2023-02-20 NOTE — Patient Instructions (Signed)
Nice to see you again  I am glad the Advair has helped  I sent refills today  Return to clinic in 6 months or sooner as needed Dr. Judeth Horn

## 2023-02-20 NOTE — Progress Notes (Signed)
@Patient  ID: [name], female    DOB: [birth year], 72 y.o.   MRN: [id number]  Chief Complaint  Patient presents with   Follow-up    Pt is here for follow up for asthma. Pt is currently on Adviar. Pt states that the medication is working wonderful and is having no issues. Pt is also on albuterol as needed and has not had to use it what so ever.     Referring provider: [name], MD  HPI:   72 y.o. woman whom we are seeing in evaluation of pulmonary hypertension, cough, and DOE.    Overall doing okay.  At last visit Advair was started with cough and dyspnea on exertion.  This helped her cough.  Cough essentially gone.  Dyspnea on exertion slightly better as well.  Even during the pollen season right now things are going well.  She is pleased with this.  We discussed at length her CT scan in the past with possible mild fibrosis.  Given her well-controlled symptoms she has not needed additional medications that may alter how well she is feeling.  Discussed expectant management for inhaler therapy with asthma cough and dyspnea on exertion.  Discussed stepdown therapy in the future.  HPI at initial visit: Patient first mentions cough today.  Started what sounds like viral illness.  Cough lingered.  Was given Solu-Medrol shot, prednisone taper, sounds like antibiotics.  Cough markedly improved.  Although somewhat persists.  She think steroids is most beneficial.  In general, she thinks her asthma is well-controlled.  Her as needed albuterol has not really affected the cough positively or negatively, not really any improvement or change.  Reason for referral was pulmonary hypertension.  She had a submassive PE presumably submassive PE 01/2017 with TTE demonstrating RV dysfunction and dilated RV.  It is possible that was chronic and predated this.  On repeat TTE 01/2019 findings were similar with back the question of either chronic thromboembolic disease or preceding pulmonary  hypertension that was just discovered in the setting of pulmonary embolus.  She had left heart catheterization in 2020 with mildly elevated LVEDP to 17.  She had a sleep test in 2020 that demonstrated mild apnea.  She is not able to tolerate CPAP therapy.  Not using any therapies for OSA.  Her most recent TTE 10/2022 is largely unchanged, RV dysfunction, dilated RV, dilated RA, largely unchanged dating back to 2018, 5-1/2 years prior.  We discussed the underlying etiologies of possible pulmonary hypertension in general and as it pertains to her specific case.  We discussed the role and rationale of additional diagnostic testing up to and including formal diagnosis of port after the right heart cath.  Discussed the role and rationale for therapeutic options, the risk and benefits, adverse events etc. with pulmonary vasodilators.  Questionaires / Pulmonary Flowsheets:   ACT:  Asthma Control Test ACT Total Score  02/20/2023 11:35 AM 21    MMRC:     No data to display           Epworth:     10/22/2018   11:00 AM  Results of the Epworth flowsheet  Sitting and reading 0  Watching TV 1  Sitting, inactive in a public place (e.g. a theatre or a meeting) 0  As a passenger in a car for an hour without a break 0  Lying down to rest in the afternoon when circumstances permit 3  Sitting and talking to someone 0  Sitting quietly after  a lunch without alcohol 0  In a car, while stopped for a few minutes in traffic 0  Total score 4    Tests:   FENO:  No results found for: "NITRICOXIDE"  PFT:     No data to display           WALK:     10/11/2022    1:48 PM  SIX MIN WALK  Supplimental Oxygen during Test? (L/min) No  Tech Comments: Pt was unable to complete 3 laps. She stated she was tired after 2 laps. Pt walked at a regular pace without stopping or complaining of SOB for 2 laps.    Imaging: Personally reviewed and as per EMR discussion in this note No results found.  Lab  Results: Personally reviewed CBC    Component Value Date/Time   WBC 8.9 11/29/2022 0943   RBC 4.52 11/29/2022 0943   HGB 12.7 11/29/2022 0943   HGB 11.9 01/23/2019 0910   HGB 12.9 09/05/2006 1321   HCT 39.3 11/29/2022 0943   HCT 37.7 01/23/2019 0910   HCT 38.2 09/05/2006 1321   PLT 261 11/29/2022 0943   PLT 348 01/23/2019 0910   MCV 86.9 11/29/2022 0943   MCV 87 01/23/2019 0910   MCV 85.1 09/05/2006 1321   MCH 28.1 11/29/2022 0943   MCHC 32.3 11/29/2022 0943   RDW 12.5 11/29/2022 0943   RDW 13.4 01/23/2019 0910   RDW 13.6 09/05/2006 1321   LYMPHSABS 2,866 11/29/2022 0943   LYMPHSABS 2.7 01/23/2019 0910   LYMPHSABS 3.1 09/05/2006 1321   MONOABS 2.3 (H) 01/15/2019 0355   MONOABS 0.7 09/05/2006 1321   EOSABS 605 (H) 11/29/2022 0943   EOSABS 0.6 (H) 01/23/2019 0910   BASOSABS 80 11/29/2022 0943   BASOSABS 0.1 01/23/2019 0910   BASOSABS 0.0 09/05/2006 1321    BMET    Component Value Date/Time   NA 141 11/29/2022 0943   NA 141 09/13/2022 1208   K 4.6 11/29/2022 0943   CL 103 11/29/2022 0943   CO2 30 11/29/2022 0943   GLUCOSE 128 (H) 11/29/2022 0943   BUN 16 11/29/2022 0943   BUN 22 09/13/2022 1208   CREATININE 1.11 (H) 11/29/2022 0943   CALCIUM 9.5 11/29/2022 0943   GFRNONAA 48 (L) 04/27/2021 1240   GFRAA 56 (L) 04/27/2021 1240    BNP    Component Value Date/Time   BNP 29 10/04/2022 1026    ProBNP    Component Value Date/Time   PROBNP 67.0 04/12/2008 0500    Specialty Problems       Pulmonary Problems   Asthma    Qualifier: Diagnosis of  By: Vear ClockPhillips CMA (AAMA), Judeth CornfieldStephanie        Allergic rhinitis   OSA (obstructive sleep apnea)    Allergies  Allergen Reactions   Aspirin Shortness Of Breath   Lisinopril Anaphylaxis and Other (See Comments)    Possibly Lisinopril or Levaquin, pt was taking both at the time of reaction   Bee Venom Swelling   Morphine And Related Itching and Other (See Comments)    Needs benadryl prior to     Immunization  History  Administered Date(s) Administered   Influenza Split 08/01/2013   Influenza, High Dose Seasonal PF 09/01/2017, 08/16/2018, 08/16/2019, 08/07/2020, 09/09/2022   Influenza-Unspecified 08/09/2012, 09/30/2015, 07/02/2016, 08/16/2019, 08/19/2021   Moderna Covid-19 Vaccine Bivalent Booster 5850yrs & up 08/19/2021, 09/09/2022   Moderna Sars-Covid-2 Vaccination 01/10/2020, 02/07/2020, 09/16/2020   PNEUMOCOCCAL CONJUGATE-20 05/24/2022   Pneumococcal Conjugate-13 10/19/2015, 07/20/2017  Pneumococcal Polysaccharide-23 05/06/1999, 09/16/2019   Zoster, Live 08/09/2012    Past Medical History:  Diagnosis Date   Anemia    none recently   Anxiety    Arthritis    Asthma    Complete tear of right rotator cuff 12/05/2014   Coronary artery disease    Depression    Diabetes mellitus    Last A1C was 6.2 on 09/18/2020   Diverticulitis    Diverticulosis    Family history of adverse reaction to anesthesia    mom  nausea and vomiting   GERD (gastroesophageal reflux disease)    History of kidney stones    seen on scan, has not been a problem.   Hypertension    Hypothyroidism    Migraine    Personal history of kidney stones    Pneumonia    hx   Pulmonary embolism    a. diagnosed in 01/2017 w/ imaging showing multiple right lung PE with right heart strain. Started on Xarelto.    Shortness of breath dyspnea    exersion   Thyroid disease    hypo   Wears glasses     Tobacco History: Social History   Tobacco Use  Smoking Status Former   Packs/day: 0.50   Years: 6.00   Additional pack years: 0.00   Total pack years: 3.00   Types: Cigarettes   Quit date: 11/14/1982   Years since quitting: 40.2  Smokeless Tobacco Never  Tobacco Comments   quit smoking 30 years ago   Counseling given: Not Answered Tobacco comments: quit smoking 30 years ago   Continue to not smoke  Outpatient Encounter Medications as of 02/20/2023  Medication Sig   acetaminophen (TYLENOL) 500 MG tablet Take 500 mg  by mouth every 6 (six) hours as needed.   albuterol (VENTOLIN HFA) 108 (90 Base) MCG/ACT inhaler INHALE 2 PUFFS INTO THE LUNGS EVERY 6 (SIX) HOURS AS NEEDED FOR WHEEZING OR SHORTNESS OF BREATH   ALPRAZolam (XANAX) 0.5 MG tablet TAKE 1 TABLET BY MOUTH TWICE A DAY AS NEEDED   atorvastatin (LIPITOR) 80 MG tablet Take 1 tablet (80 mg total) by mouth daily at 6 PM.   cetirizine (ZYRTEC) 10 MG tablet Take 10 mg by mouth at bedtime.   Cholecalciferol (VITAMIN D3) 5000 units CAPS Take 5,000 Units by mouth daily.   clobetasol ointment (TEMOVATE) 0.05 % Apply 1 application topically 2 (two) times daily as needed (skin irritation).   EPINEPHrine 0.3 mg/0.3 mL IJ SOAJ injection Inject 0.3 mLs (0.3 mg total) into the muscle as needed for anaphylaxis.   escitalopram (LEXAPRO) 10 MG tablet TAKE 1 TABLET BY MOUTH EVERY DAY   famotidine (PEPCID) 10 MG tablet Take 10 mg by mouth at bedtime.   furosemide (LASIX) 20 MG tablet Take 1 tablet (20 mg total) by mouth 2 (two) times daily.   levothyroxine (SYNTHROID) 200 MCG tablet TAKE 1 TABLET BY MOUTH EVERY DAY ON EMPTY STOMACH BEFORE BREAKFAST   magnesium oxide (MAG-OX) 400 MG tablet Take 1 tablet by mouth on Tuesdays, Thursdays, Saturdays, & Sundays.  Take 1 tablet by mouth twice a day on Mondays, Wednesdays, & Fridays (Patient taking differently: Take 400 mg by mouth See admin instructions. Take 400 mg by mouth once daily on Tuesdays, Thursdays, Saturdays, & Sundays and take 400 mg twice a day on Mondays, Wednesdays, & Fridays)   metoprolol tartrate (LOPRESSOR) 25 MG tablet TAKE 1 TABLET BY MOUTH EVERY DAY IN THE MORNING   montelukast (SINGULAIR) 10 MG tablet  TAKE 1 TABLET BY MOUTH EVERYDAY AT BEDTIME   Multiple Vitamins-Minerals (PRESERVISION AREDS 2 PO) Take 1 capsule by mouth daily.   olmesartan (BENICAR) 40 MG tablet TAKE 1 TABLET BY MOUTH EVERY DAY   traZODone (DESYREL) 150 MG tablet TAKE 1 TABLET BY MOUTH EVERYDAY AT BEDTIME   XARELTO 20 MG TABS tablet TAKE 1  TABLET BY MOUTH DAILY WITH SUPPER   zolpidem (AMBIEN) 10 MG tablet TAKE 1 TABLET BY MOUTH EVERY DAY AT BEDTIME AS NEEDED   [DISCONTINUED] fluticasone-salmeterol (ADVAIR) 500-50 MCG/ACT AEPB Inhale 1 puff into the lungs in the morning and at bedtime.   fluticasone-salmeterol (ADVAIR) 500-50 MCG/ACT AEPB Inhale 1 puff into the lungs in the morning and at bedtime.   nitroGLYCERIN (NITROSTAT) 0.4 MG SL tablet Place 1 tablet (0.4 mg total) under the tongue every 5 (five) minutes as needed for chest pain.   [DISCONTINUED] ergocalciferol (DRISDOL) 1.25 MG (50000 UT) capsule Take 1 capsule (50,000 Units total) by mouth once a week.   No facility-administered encounter medications on file as of 02/20/2023.     Review of Systems  Review of Systems  N/a Physical Exam  Pulse 63   Wt 292 lb 12.8 oz (132.8 kg)   SpO2 93%   BMI 51.05 kg/m   Wt Readings from Last 5 Encounters:  02/20/23 292 lb 12.8 oz (132.8 kg)  12/20/22 291 lb 3.2 oz (132.1 kg)  12/06/22 291 lb (132 kg)  11/21/22 288 lb 9.6 oz (130.9 kg)  10/11/22 291 lb 3.2 oz (132.1 kg)    BMI Readings from Last 5 Encounters:  02/20/23 51.05 kg/m  12/20/22 50.77 kg/m  12/06/22 50.74 kg/m  11/21/22 50.32 kg/m  10/11/22 50.77 kg/m     Physical Exam General: Sitting in chair, no acute distress Eyes: EOMI, no icterus Neck: Supple, no JVP appreciated Pulmonary: Distant, clear Cardiovascular: Tachycardic, regular rhythm Abdomen: Nondistended, bowel sounds present MSK: No synovitis, no joint effusion Neuro: Normal gait with assistance of rollator, no weakness Psych: Normal mood, full affect   Assessment & Plan:   Presumed pulmonary hypertension: Based on serial echocardiograms, signs of pulmonary hypertension and RV dysfunction dating back to 01/2017.  This was during the time of acute PE.  Unchanged in 01/2019.  Largely unchanged 10/2022, most recently.  Possible etiologies include group 3 disease in the setting of OHS/OSA and  nonadherence to CPAP as well as mild interstitial changes on CT hi res 11/2022. Group 4 disease felt unlikely given normal VQ scan 11/2022, notably does have a history of PE in 2018. Group 1 disease, PAH, is always possible.  Given discovery of pulmonary fibrosis, systemic vasodilators probably not in her best interest.  Difficult to tell how much it will help her in general in terms of inhaled vasodilators.  Given her aversion to CPAP therapies, do not recommend treatment for pulmonary hypertension with medications without treatment of OSA.  OSA: Based on home sleep test 2020.  Not adherent to CPAP.  Discussed role of dental evaluation.  She is precontemplative.  Discussed we will need to treat OSA in order to effectively treat presumed pulmonary hypertension.  Cough: Worsened over last couple months.  Markedly improved although not resolved after steroid therapy.  Suspect related to asthma, cough variant asthma.  Cough is markedly improved with high-dose Advair discus.  Asthma: Dyspnea exertion and cough improved with Advair therapy.  High-dose Advair refilled today.  Stepdown therapy discussed, consider in the future.  ILD: Very mild on imaging, faint linear basilar  opacity persist on prone imaging.  IPF favored.  Discussed role and rationale for antifibrotic therapy.  She does not wish to pursue this at this time, reconvene in the coming months and consider starting at that time.   Return in about 6 months (around 08/22/2023).   Karren Burly, MD 02/20/2023

## 2023-03-04 ENCOUNTER — Other Ambulatory Visit: Payer: Self-pay | Admitting: Internal Medicine

## 2023-03-06 ENCOUNTER — Other Ambulatory Visit: Payer: Self-pay

## 2023-03-06 ENCOUNTER — Other Ambulatory Visit: Payer: Self-pay | Admitting: Internal Medicine

## 2023-03-06 DIAGNOSIS — I2609 Other pulmonary embolism with acute cor pulmonale: Secondary | ICD-10-CM

## 2023-03-06 MED ORDER — RIVAROXABAN 20 MG PO TABS: 20.0000 mg | ORAL_TABLET | Freq: Every day | ORAL | 1 refills | Status: DC

## 2023-03-06 MED ORDER — ACCU-CHEK GUIDE VI STRP: ORAL_STRIP | 99 refills | Status: DC

## 2023-03-06 NOTE — Telephone Encounter (Signed)
Received faxed refill request for Xarelto from pharmacy.  Pt last saw Robin Searing, NP on 08/29/22, last labs 11/29/22 Creat 1.11, age 72, weight 132.8kg, CrCl 98.87, based on CrCl pt is on appropriate dosage of Xarelto  QD for hx of PE/DVT.  Will refill rx.

## 2023-04-03 ENCOUNTER — Other Ambulatory Visit: Payer: Self-pay | Admitting: Internal Medicine

## 2023-06-03 ENCOUNTER — Other Ambulatory Visit: Payer: Self-pay | Admitting: Internal Medicine

## 2023-07-10 ENCOUNTER — Other Ambulatory Visit: Payer: Self-pay | Admitting: Internal Medicine

## 2023-07-10 ENCOUNTER — Other Ambulatory Visit: Payer: Self-pay | Admitting: Nurse Practitioner

## 2023-07-19 DIAGNOSIS — H35371 Puckering of macula, right eye: Secondary | ICD-10-CM | POA: Diagnosis not present

## 2023-07-19 DIAGNOSIS — H43813 Vitreous degeneration, bilateral: Secondary | ICD-10-CM | POA: Diagnosis not present

## 2023-07-19 DIAGNOSIS — D3131 Benign neoplasm of right choroid: Secondary | ICD-10-CM | POA: Diagnosis not present

## 2023-07-19 DIAGNOSIS — H353121 Nonexudative age-related macular degeneration, left eye, early dry stage: Secondary | ICD-10-CM | POA: Diagnosis not present

## 2023-07-19 DIAGNOSIS — H04123 Dry eye syndrome of bilateral lacrimal glands: Secondary | ICD-10-CM | POA: Diagnosis not present

## 2023-07-19 DIAGNOSIS — H18513 Endothelial corneal dystrophy, bilateral: Secondary | ICD-10-CM | POA: Diagnosis not present

## 2023-07-19 DIAGNOSIS — R7309 Other abnormal glucose: Secondary | ICD-10-CM | POA: Diagnosis not present

## 2023-07-19 LAB — HM DIABETES EYE EXAM

## 2023-07-21 NOTE — Progress Notes (Signed)
Patient Care Team: [name], MD as PCP - General (Internal Medicine) Lyn Records, MD (Inactive) as PCP - Cardiology (Cardiology) Avel Peace, MD as Consulting Physician (General Surgery) Iva Boop, MD as Consulting Physician (Gastroenterology)  Visit Date: 07/28/23  Subjective:    Patient ID: [name] , Female   DOB: 01/21/1951, 72 y.o.    MRN: [id number]   72 y.o. Female presents today for a 6 month follow-up. History of anemia, anxiety and depression, arthritis, asthma, complete tear of right rotator cuff 2016, coronary artery disease, Type 2 diabetes mellitus, diverticulosis, GERD, kidney stones, hypertension, hypothyroidism, migraine headaches, pneumonia, pulmonary embolism 2018.  History of anxiety and depression treated with alprazolam 0.5 mg twice daily as needed, escitalopram 10 mg daily, trazodone 150 mg at bedtime.  History of shoulder and knee pain. This has been stable recently.  History of Type 2 diabetes mellitus. HGBA1c at 6.1% on 07/25/23, down from 6.3% on 11/29/22.  History of insomnia treated with zolpidem 10 mg at bedtime as needed.  History hypertension treated with olmesartan 40 mg daily, metoprolol tartrate 25 mg daily.  History of allergies and asthma treated with montelukast 10 mg at bedtime, albuterol inhaler as needed, cetirizine 10 mg at bedtime.  History of GERD treated with famotidine 10 mg at bedtime.  History of edema treated with furosemide 40 mg daily.  History of hypothyroidism treated with levothyroxine 200 mcg daily.  History of Vitamin D deficiency treated with Drisdol 50,000 units weekly.  History of hyperlipidemia treated with atorvastatin 80 mg daily. HDL low at 47. Liver functions normal.   She has established care with pulmonology Dr. Vilma Meckel for her chronic cough and last saw him on 02/20/23. Not adherent to CPAP. 11/29/2022 CT chest high resolution showed: 1) mild pulmonary fibrosis, 2) gross  enlargement of the main pulmonary artery as can be seen in pulmonary hypertension, 3) coronary artery disease.  She was last evaluated by cardiology NP Robin Searing on 08/29/22. 12/23 echocardiogram showed left atrial size was normal in size, right atrial size was normal in size, no evidence of pericardial effusion, mitral valve is grossly normal, no evidence of mitral valve regurgitation, tricuspid valve is grossly normal. Tricuspid valve regurgitation is mild, aortic valve is tricuspid. Aortic valve regurgitation is not visualized, pulmonic valve was normal in structure. Pulmonic valve regurgitation is not visualized, aortic root and ascending aorta are structurally normal, with no evidence of dilatation, inferior vena cava is normal in size with greater than 50% respiratory variability, suggesting right atrial pressure of 3 mmHg, no atrial level shunt detected by color flow Doppler.   Past Medical History:  Diagnosis Date   Anemia    none recently   Anxiety    Arthritis    Asthma    Complete tear of right rotator cuff 12/05/2014   Coronary artery disease    Depression    Diabetes mellitus    Last A1C was 6.2 on 09/18/2020   Diverticulitis    Diverticulosis    Family history of adverse reaction to anesthesia    mom  nausea and vomiting   GERD (gastroesophageal reflux disease)    History of kidney stones    seen on scan, has not been a problem.   Hypertension    Hypothyroidism    Migraine    Personal history of kidney stones    Pneumonia    hx   Pulmonary embolism (HCC)    a. diagnosed in 01/2017 w/ imaging  showing multiple right lung PE with right heart strain. Started on Xarelto.    Shortness of breath dyspnea    exersion   Thyroid disease    hypo   Wears glasses      Family History  Problem Relation Age of Onset   Heart disease Mother    Kidney failure Father    Diabetes Father    Breast cancer Paternal Aunt    Colon cancer Neg Hx     Social History   Social History  Narrative   Social history: She is divorced.  Does not smoke.  Occasional alcohol consumption.  Longstanding history of morbid obesity.  Weight in 1996 was 307 pounds.  She retired as Public librarian at [employer].       Family history: Father deceased with history of stroke, hypertension, diabetes and COPD.  Mother deceased with history of congestive heart failure and chronic kidney disease.  1 sister and 1 son in good health.      Review of Systems  Constitutional:  Negative for fever and malaise/fatigue.  HENT:  Negative for congestion.   Eyes:  Negative for blurred vision.  Respiratory:  Negative for cough and shortness of breath.   Cardiovascular:  Negative for chest pain, palpitations and leg swelling.  Gastrointestinal:  Negative for vomiting.  Musculoskeletal:  Negative for back pain.  Skin:  Negative for rash.  Neurological:  Negative for loss of consciousness and headaches.        Objective:   Vitals: BP 108/80   Pulse (!) 58   Ht 5' 3.75" (1.619 m)   Wt 300 lb (136.1 kg)   SpO2 96%   BMI 51.90 kg/m    Physical Exam Vitals and nursing note reviewed.  Constitutional:      General: She is not in acute distress.    Appearance: Normal appearance. She is not toxic-appearing.  HENT:     Head: Normocephalic and atraumatic.  Cardiovascular:     Rate and Rhythm: Normal rate and regular rhythm. No extrasystoles are present.    Pulses: Normal pulses.     Heart sounds: Normal heart sounds. No murmur heard.    No friction rub. No gallop.  Pulmonary:     Effort: Pulmonary effort is normal. No respiratory distress.     Breath sounds: Normal breath sounds. No wheezing or rales.  Skin:    General: Skin is warm and dry.  Neurological:     Mental Status: She is alert and oriented to person, place, and time. Mental status is at baseline.  Psychiatric:        Mood and Affect: Mood normal.        Behavior: Behavior normal.        Thought Content:  Thought content normal.        Judgment: Judgment normal.       Results:   Studies obtained and personally reviewed by me:  11/29/2022 CT chest high resolution showed: 1) mild pulmonary fibrosis, 2) gross enlargement of the main pulmonary artery as can be seen in pulmonary hypertension, 3) coronary artery disease.  12/23 echocardiogram showed left atrial size was normal in size, right atrial size was normal in size, no evidence of pericardial effusion, mitral valve is grossly normal, no evidence of mitral valve regurgitation, tricuspid valve is grossly normal. Tricuspid valve regurgitation is mild, aortic valve is tricuspid. Aortic valve regurgitation is not visualized, pulmonic valve was normal in structure. Pulmonic valve regurgitation is not visualized,  aortic root and ascending aorta are structurally normal, with no evidence of dilatation, inferior vena cava is normal in size with greater than 50% respiratory variability, suggesting right atrial pressure of 3 mmHg, no atrial level shunt detected by color flow Doppler.   Labs:       Component Value Date/Time   NA 141 11/29/2022 0943   NA 141 09/13/2022 1208   K 4.6 11/29/2022 0943   CL 103 11/29/2022 0943   CO2 30 11/29/2022 0943   GLUCOSE 128 (H) 11/29/2022 0943   BUN 16 11/29/2022 0943   BUN 22 09/13/2022 1208   CREATININE 1.11 (H) 11/29/2022 0943   CALCIUM 9.5 11/29/2022 0943   PROT 6.7 07/25/2023 1004   PROT 6.6 05/08/2019 1013   ALBUMIN 4.0 05/08/2019 1013   AST 12 07/25/2023 1004   ALT 9 07/25/2023 1004   ALKPHOS 89 05/08/2019 1013   BILITOT 0.4 07/25/2023 1004   BILITOT 0.4 05/08/2019 1013   GFRNONAA 48 (L) 04/27/2021 1240   GFRAA 56 (L) 04/27/2021 1240     Lab Results  Component Value Date   WBC 8.9 11/29/2022   HGB 12.7 11/29/2022   HCT 39.3 11/29/2022   MCV 86.9 11/29/2022   PLT 261 11/29/2022    Lab Results  Component Value Date   CHOL 96 07/25/2023   HDL 47 (L) 07/25/2023   LDLCALC 33 07/25/2023    TRIG 77 07/25/2023   CHOLHDL 2.0 07/25/2023    Lab Results  Component Value Date   HGBA1C 6.1 (H) 07/25/2023     Lab Results  Component Value Date   TSH 1.64 11/29/2022      Assessment & Plan:   Anxiety and depression: stable with alprazolam 0.5 mg twice daily as needed, escitalopram 10 mg daily, trazodone 150 mg at bedtime.  Chronic shoulder and knee pain: this has been stable recently.  Type 2 diabetes mellitus: diet and exercise controlled. HGBA1c at 6.1% on 07/25/23, down from 6.3% on 11/29/22. She had a normal eye exam on 07/19/23.   Insomnia: treated with zolpidem 10 mg at bedtime as needed.  Hypertension: treated with olmesartan 40 mg daily, metoprolol tartrate 25 mg daily. Blood pressure normal today at 108/80.  Allergies and asthma: treated with montelukast 10 mg at bedtime, albuterol inhaler as needed, cetirizine 10 mg at bedtime.  GERD: treated with famotidine 10 mg at bedtime.  Edema: treated with furosemide 40 mg daily.  Hypothyroidism: treated with levothyroxine 200 mcg daily.  Vitamin D deficiency: treated with Drisdol 50,000 units weekly.  History of pulmonary hypertension  Hyperlipidemia: treated with atorvastatin 80 mg daily. HDL low at 47. Liver functions normal.   Vaccine counseling: suggested she get Flu, Covid-19 vaccines.  Suggested Tdap update and Shingrix vaccines at her convenience. She is disinclined as she has Tammy exposure to these illnesses but does do delivery of meals on Wheels  Return in 6 months for Medicare wellness and health maintenance exam or as needed.  Vaccines discussed.  Due for colonoscopy this year with Dr. [name].  Cologuard is an option.  I,Alexander Ruley,acting as a Neurosurgeon for [name], MD.,have documented all relevant documentation on the behalf of [name], MD,as directed by  [name], MD while in the presence of [name], MD.   I, [name], MD, have reviewed all documentation for this visit.  The documentation on 08/06/23 for the exam, diagnosis, procedures, and orders are all accurate and complete.

## 2023-07-25 ENCOUNTER — Other Ambulatory Visit: Payer: Medicare Other

## 2023-07-25 DIAGNOSIS — E7800 Pure hypercholesterolemia, unspecified: Secondary | ICD-10-CM | POA: Diagnosis not present

## 2023-07-25 DIAGNOSIS — E1100 Type 2 diabetes mellitus with hyperosmolarity without nonketotic hyperglycemic-hyperosmolar coma (NKHHC): Secondary | ICD-10-CM

## 2023-07-25 DIAGNOSIS — I272 Pulmonary hypertension, unspecified: Secondary | ICD-10-CM | POA: Diagnosis not present

## 2023-07-25 DIAGNOSIS — I10 Essential (primary) hypertension: Secondary | ICD-10-CM

## 2023-07-25 DIAGNOSIS — E1169 Type 2 diabetes mellitus with other specified complication: Secondary | ICD-10-CM

## 2023-07-25 DIAGNOSIS — I2510 Atherosclerotic heart disease of native coronary artery without angina pectoris: Secondary | ICD-10-CM | POA: Diagnosis not present

## 2023-07-25 DIAGNOSIS — I440 Atrioventricular block, first degree: Secondary | ICD-10-CM | POA: Diagnosis not present

## 2023-07-26 DIAGNOSIS — I440 Atrioventricular block, first degree: Secondary | ICD-10-CM | POA: Diagnosis not present

## 2023-07-26 DIAGNOSIS — I499 Cardiac arrhythmia, unspecified: Secondary | ICD-10-CM | POA: Diagnosis not present

## 2023-07-26 LAB — LIPID PANEL
Cholesterol: 96 mg/dL (ref <200)
HDL: 47 mg/dL — ABNORMAL LOW (ref >=50)
LDL Cholesterol (Calc): 33 mg/dL
Non-HDL Cholesterol (Calc): 49 mg/dL (ref <130)
Total CHOL/HDL Ratio: 2 (calc) (ref <5.0)
Triglycerides: 77 mg/dL (ref <150)

## 2023-07-26 LAB — HEPATIC FUNCTION PANEL
AG Ratio: 1.4 (calc) (ref 1.0–2.5)
ALT: 9 U/L (ref 6–29)
AST: 12 U/L (ref 10–35)
Albumin: 3.9 g/dL (ref 3.6–5.1)
Alkaline phosphatase (APISO): 84 U/L (ref 37–153)
Bilirubin, Direct: 0.1 mg/dL (ref 0.0–0.2)
Globulin: 2.8 g/dL (ref 1.9–3.7)
Indirect Bilirubin: 0.3 mg/dL (ref 0.2–1.2)
Total Bilirubin: 0.4 mg/dL (ref 0.2–1.2)
Total Protein: 6.7 g/dL (ref 6.1–8.1)

## 2023-07-26 LAB — HEMOGLOBIN A1C
Hgb A1c MFr Bld: 6.1 %{Hb} — ABNORMAL HIGH (ref <5.7)
Mean Plasma Glucose: 128 mg/dL
eAG (mmol/L): 7.1 mmol/L

## 2023-07-28 ENCOUNTER — Ambulatory Visit (INDEPENDENT_AMBULATORY_CARE_PROVIDER_SITE_OTHER): Payer: Medicare Other | Admitting: Internal Medicine

## 2023-07-28 ENCOUNTER — Encounter: Payer: Self-pay | Admitting: Internal Medicine

## 2023-07-28 DIAGNOSIS — I2722 Pulmonary hypertension due to left heart disease: Secondary | ICD-10-CM

## 2023-07-28 DIAGNOSIS — M170 Bilateral primary osteoarthritis of knee: Secondary | ICD-10-CM

## 2023-07-28 DIAGNOSIS — E785 Hyperlipidemia, unspecified: Secondary | ICD-10-CM | POA: Diagnosis not present

## 2023-07-28 DIAGNOSIS — M1712 Unilateral primary osteoarthritis, left knee: Secondary | ICD-10-CM

## 2023-07-28 DIAGNOSIS — F419 Anxiety disorder, unspecified: Secondary | ICD-10-CM

## 2023-07-28 DIAGNOSIS — F4323 Adjustment disorder with mixed anxiety and depressed mood: Secondary | ICD-10-CM | POA: Diagnosis not present

## 2023-07-28 DIAGNOSIS — I10 Essential (primary) hypertension: Secondary | ICD-10-CM | POA: Diagnosis not present

## 2023-07-28 DIAGNOSIS — Z86711 Personal history of pulmonary embolism: Secondary | ICD-10-CM

## 2023-07-28 DIAGNOSIS — J8410 Pulmonary fibrosis, unspecified: Secondary | ICD-10-CM

## 2023-07-28 DIAGNOSIS — E039 Hypothyroidism, unspecified: Secondary | ICD-10-CM

## 2023-07-28 DIAGNOSIS — F32A Depression, unspecified: Secondary | ICD-10-CM

## 2023-07-28 DIAGNOSIS — E119 Type 2 diabetes mellitus without complications: Secondary | ICD-10-CM

## 2023-07-28 DIAGNOSIS — Z8639 Personal history of other endocrine, nutritional and metabolic disease: Secondary | ICD-10-CM

## 2023-07-28 DIAGNOSIS — G4733 Obstructive sleep apnea (adult) (pediatric): Secondary | ICD-10-CM

## 2023-07-28 DIAGNOSIS — Z7901 Long term (current) use of anticoagulants: Secondary | ICD-10-CM | POA: Diagnosis not present

## 2023-07-28 DIAGNOSIS — M25511 Pain in right shoulder: Secondary | ICD-10-CM | POA: Diagnosis not present

## 2023-07-28 DIAGNOSIS — E1169 Type 2 diabetes mellitus with other specified complication: Secondary | ICD-10-CM

## 2023-07-28 DIAGNOSIS — I5022 Chronic systolic (congestive) heart failure: Secondary | ICD-10-CM

## 2023-07-28 DIAGNOSIS — Z6841 Body Mass Index (BMI) 40.0 and over, adult: Secondary | ICD-10-CM

## 2023-07-28 DIAGNOSIS — Z8679 Personal history of other diseases of the circulatory system: Secondary | ICD-10-CM

## 2023-07-28 DIAGNOSIS — G8929 Other chronic pain: Secondary | ICD-10-CM

## 2023-07-29 DIAGNOSIS — Z23 Encounter for immunization: Secondary | ICD-10-CM | POA: Diagnosis not present

## 2023-08-03 ENCOUNTER — Other Ambulatory Visit: Payer: Self-pay | Admitting: Internal Medicine

## 2023-08-03 ENCOUNTER — Other Ambulatory Visit: Payer: Self-pay | Admitting: Nurse Practitioner

## 2023-08-03 DIAGNOSIS — I2609 Other pulmonary embolism with acute cor pulmonale: Secondary | ICD-10-CM

## 2023-08-04 NOTE — Telephone Encounter (Signed)
Last refill 01/05/23 #90 + 1

## 2023-08-04 NOTE — Telephone Encounter (Signed)
Prescription refill request for Xarelto received.  Indication:PE Last office visit:10/23 Weight:136.1  kg Age:72 Scr:1.11  1/24 CrCl:90.74  ml/min  Prescription refilled

## 2023-08-06 NOTE — Patient Instructions (Signed)
It was a pleasure to see you today and know that you are doing well.  We discussed lab studies and vaccine recommendations.  Patient disinclined to take vaccines at this time.  She does have some exposure with Meals on Wheels to various respiratory pathogens.  Her medical issues appear to be stable.  She will follow-up in 6 months for Medicare wellness and health maintenance exam.

## 2023-08-15 ENCOUNTER — Ambulatory Visit (INDEPENDENT_AMBULATORY_CARE_PROVIDER_SITE_OTHER): Payer: Medicare Other | Admitting: Pulmonary Disease

## 2023-08-15 ENCOUNTER — Encounter: Payer: Self-pay | Admitting: Pulmonary Disease

## 2023-08-15 VITALS — BP 130/80 | HR 70 | Ht 65.0 in | Wt 303.8 lb

## 2023-08-15 DIAGNOSIS — J4540 Moderate persistent asthma, uncomplicated: Secondary | ICD-10-CM

## 2023-08-15 DIAGNOSIS — G4733 Obstructive sleep apnea (adult) (pediatric): Secondary | ICD-10-CM | POA: Diagnosis not present

## 2023-08-15 MED ORDER — FLUTICASONE-SALMETEROL 500-50 MCG/ACT IN AEPB: 1.0000 | INHALATION_SPRAY | Freq: Two times a day (BID) | RESPIRATORY_TRACT | 11 refills | Status: DC

## 2023-08-15 NOTE — Patient Instructions (Addendum)
Nice to see you again  No changes to medications  Advair refilled today  Return to clinic in 6 months or sooner as needed

## 2023-08-15 NOTE — Progress Notes (Signed)
@Patient  ID: [name], female    DOB: March 12, 1951, 72 y.o.   MRN: [id number]  Chief Complaint  Patient presents with   Follow-up    Pt is here for Asthma F/U visit. Pt states her cough is much better.     Referring provider: [name], MD  HPI:   72 y.o. woman whom we are seeing in evaluation of pulmonary hypertension, cough, and DOE.  Most recent PCP note reviewed.   Overall doing well.Cough remains markedly well-controlled on Advair high-dose Diskus.  After shared decision making decided to continue.  Discussed her CT scan in the past.  Faint possible early signs of ILD.  Symptoms not worse.  Discussed repeating images in the future.  HPI at initial visit: Patient first mentions cough today.  Started what sounds like viral illness.  Cough lingered.  Was given Solu-Medrol shot, prednisone taper, sounds like antibiotics.  Cough markedly improved.  Although somewhat persists.  She think steroids is most beneficial.  In general, she thinks her asthma is well-controlled.  Her as needed albuterol has not really affected the cough positively or negatively, not really any improvement or change.  Reason for referral was pulmonary hypertension.  She had a submassive PE presumably submassive PE 01/2017 with TTE demonstrating RV dysfunction and dilated RV.  It is possible that was chronic and predated this.  On repeat TTE 01/2019 findings were similar with back the question of either chronic thromboembolic disease or preceding pulmonary hypertension that was just discovered in the setting of pulmonary embolus.  She had left heart catheterization in 2020 with mildly elevated LVEDP to 17.  She had a sleep test in 2020 that demonstrated mild apnea.  She is not able to tolerate CPAP therapy.  Not using any therapies for OSA.  Her most recent TTE 10/2022 is largely unchanged, RV dysfunction, dilated RV, dilated RA, largely unchanged dating back to 2018, 5-1/2 years prior.  We discussed the  underlying etiologies of possible pulmonary hypertension in general and as it pertains to her specific case.  We discussed the role and rationale of additional diagnostic testing up to and including formal diagnosis of port after the right heart cath.  Discussed the role and rationale for therapeutic options, the risk and benefits, adverse events etc. with pulmonary vasodilators.  Questionaires / Pulmonary Flowsheets:   ACT:  Asthma Control Test ACT Total Score  08/15/2023  3:25 PM 24  02/20/2023 11:35 AM 21    MMRC:     No data to display          Epworth:     10/22/2018   11:00 AM  Results of the Epworth flowsheet  Sitting and reading 0  Watching TV 1  Sitting, inactive in a public place (e.g. a theatre or a meeting) 0  As a passenger in a car for an hour without a break 0  Lying down to rest in the afternoon when circumstances permit 3  Sitting and talking to someone 0  Sitting quietly after a lunch without alcohol 0  In a car, while stopped for a few minutes in traffic 0  Total score 4    Tests:   FENO:  No results found for: "NITRICOXIDE"  PFT:     No data to display          WALK:     10/11/2022    1:48 PM  SIX MIN WALK  Supplimental Oxygen during Test? (L/min) No  Tech Comments: Pt was  unable to complete 3 laps. She stated she was tired after 2 laps. Pt walked at a regular pace without stopping or complaining of SOB for 2 laps.    Imaging: Personally reviewed and as per EMR discussion in this note No results found.  Lab Results: Personally reviewed CBC    Component Value Date/Time   WBC 8.9 11/29/2022 0943   RBC 4.52 11/29/2022 0943   HGB 12.7 11/29/2022 0943   HGB 11.9 01/23/2019 0910   HGB 12.9 09/05/2006 1321   HCT 39.3 11/29/2022 0943   HCT 37.7 01/23/2019 0910   HCT 38.2 09/05/2006 1321   PLT 261 11/29/2022 0943   PLT 348 01/23/2019 0910   MCV 86.9 11/29/2022 0943   MCV 87 01/23/2019 0910   MCV 85.1 09/05/2006 1321   MCH 28.1  11/29/2022 0943   MCHC 32.3 11/29/2022 0943   RDW 12.5 11/29/2022 0943   RDW 13.4 01/23/2019 0910   RDW 13.6 09/05/2006 1321   LYMPHSABS 2,866 11/29/2022 0943   LYMPHSABS 2.7 01/23/2019 0910   LYMPHSABS 3.1 09/05/2006 1321   MONOABS 2.3 (H) 01/15/2019 0355   MONOABS 0.7 09/05/2006 1321   EOSABS 605 (H) 11/29/2022 0943   EOSABS 0.6 (H) 01/23/2019 0910   BASOSABS 80 11/29/2022 0943   BASOSABS 0.1 01/23/2019 0910   BASOSABS 0.0 09/05/2006 1321    BMET    Component Value Date/Time   NA 141 11/29/2022 0943   NA 141 09/13/2022 1208   K 4.6 11/29/2022 0943   CL 103 11/29/2022 0943   CO2 30 11/29/2022 0943   GLUCOSE 128 (H) 11/29/2022 0943   BUN 16 11/29/2022 0943   BUN 22 09/13/2022 1208   CREATININE 1.11 (H) 11/29/2022 0943   CALCIUM 9.5 11/29/2022 0943   GFRNONAA 48 (L) 04/27/2021 1240   GFRAA 56 (L) 04/27/2021 1240    BNP    Component Value Date/Time   BNP 29 10/04/2022 1026    ProBNP    Component Value Date/Time   PROBNP 67.0 04/12/2008 0500    Specialty Problems       Pulmonary Problems   Asthma    Qualifier: Diagnosis of  By: Vear Clock CMA (AAMA), Judeth Cornfield        Allergic rhinitis   OSA (obstructive sleep apnea)    Allergies  Allergen Reactions   Aspirin Shortness Of Breath   Lisinopril Anaphylaxis and Other (See Comments)    Possibly Lisinopril or Levaquin, pt was taking both at the time of reaction   Bee Venom Swelling   Morphine And Codeine Itching and Other (See Comments)    Needs benadryl prior to     Immunization History  Administered Date(s) Administered   Influenza Split 08/01/2013   Influenza, High Dose Seasonal PF 09/01/2017, 08/16/2018, 08/16/2019, 08/07/2020, 09/09/2022   Influenza-Unspecified 08/09/2012, 09/30/2015, 07/02/2016, 08/16/2019, 08/19/2021   Moderna Covid-19 Vaccine Bivalent Booster 81yrs & up 08/19/2021, 09/09/2022   Moderna Sars-Covid-2 Vaccination 01/10/2020, 02/07/2020, 09/16/2020   PNEUMOCOCCAL CONJUGATE-20  05/24/2022   Pneumococcal Conjugate-13 10/19/2015, 07/20/2017   Pneumococcal Polysaccharide-23 05/06/1999, 09/16/2019   Zoster, Live 08/09/2012    Past Medical History:  Diagnosis Date   Anemia    none recently   Anxiety    Arthritis    Asthma    Complete tear of right rotator cuff 12/05/2014   Coronary artery disease    Depression    Diabetes mellitus    Last A1C was 6.2 on 09/18/2020   Diverticulitis    Diverticulosis    Family history  of adverse reaction to anesthesia    mom  nausea and vomiting   GERD (gastroesophageal reflux disease)    History of kidney stones    seen on scan, has not been a problem.   Hypertension    Hypothyroidism    Migraine    Personal history of kidney stones    Pneumonia    hx   Pulmonary embolism (HCC)    a. diagnosed in 01/2017 w/ imaging showing multiple right lung PE with right heart strain. Started on Xarelto.    Shortness of breath dyspnea    exersion   Thyroid disease    hypo   Wears glasses     Tobacco History: Social History   Tobacco Use  Smoking Status Former   Current packs/day: 0.00   Average packs/day: 0.5 packs/day for 6.0 years (3.0 ttl pk-yrs)   Types: Cigarettes   Start date: 11/14/1976   Quit date: 11/14/1982   Years since quitting: 40.7  Smokeless Tobacco Never  Tobacco Comments   quit smoking 30 years ago   Counseling given: Not Answered Tobacco comments: quit smoking 30 years ago   Continue to not smoke  Outpatient Encounter Medications as of 08/15/2023  Medication Sig   ACCU-CHEK GUIDE test strip USE TO TEST TWICE DAILY   acetaminophen (TYLENOL) 500 MG tablet Take 500 mg by mouth every 6 (six) hours as needed.   albuterol (VENTOLIN HFA) 108 (90 Base) MCG/ACT inhaler INHALE 2 PUFFS INTO THE LUNGS EVERY 6 (SIX) HOURS AS NEEDED FOR WHEEZING OR SHORTNESS OF BREATH   ALPRAZolam (XANAX) 0.5 MG tablet TAKE 1 TABLET BY MOUTH TWICE A DAY AS NEEDED   atorvastatin (LIPITOR) 80 MG tablet TAKE 1 TABLET BY MOUTH DAILY  AT 6 PM.   cetirizine (ZYRTEC) 10 MG tablet Take 10 mg by mouth at bedtime.   Cholecalciferol (VITAMIN D3) 5000 units CAPS Take 5,000 Units by mouth daily.   clobetasol ointment (TEMOVATE) 0.05 % Apply 1 application topically 2 (two) times daily as needed (skin irritation).   EPINEPHrine 0.3 mg/0.3 mL IJ SOAJ injection Inject 0.3 mLs (0.3 mg total) into the muscle as needed for anaphylaxis.   escitalopram (LEXAPRO) 10 MG tablet TAKE 1 TABLET BY MOUTH EVERY DAY   famotidine (PEPCID) 10 MG tablet Take 10 mg by mouth at bedtime.   furosemide (LASIX) 20 MG tablet Take 1 tablet (20 mg total) by mouth 2 (two) times daily.   furosemide (LASIX) 40 MG tablet TAKE 1 TABLET BY MOUTH EVERY DAY   levothyroxine (SYNTHROID) 200 MCG tablet TAKE 1 TABLET BY MOUTH EVERY DAY ON EMPTY STOMACH BEFORE BREAKFAST   magnesium oxide (MAG-OX) 400 MG tablet Take 1 tablet by mouth on Tuesdays, Thursdays, Saturdays, & Sundays.  Take 1 tablet by mouth twice a day on Mondays, Wednesdays, & Fridays (Patient taking differently: Take 400 mg by mouth See admin instructions. Take 400 mg by mouth once daily on Tuesdays, Thursdays, Saturdays, & Sundays and take 400 mg twice a day on Mondays, Wednesdays, & Fridays)   metoprolol tartrate (LOPRESSOR) 25 MG tablet TAKE 1 TABLET BY MOUTH EVERY DAY IN THE MORNING   montelukast (SINGULAIR) 10 MG tablet TAKE 1 TABLET BY MOUTH EVERYDAY AT BEDTIME   Multiple Vitamins-Minerals (PRESERVISION AREDS 2 PO) Take 1 capsule by mouth daily.   olmesartan (BENICAR) 40 MG tablet TAKE 1 TABLET BY MOUTH EVERY DAY   traZODone (DESYREL) 150 MG tablet TAKE 1 TABLET BY MOUTH EVERYDAY AT BEDTIME   Vitamin D, Ergocalciferol, (DRISDOL)  1.25 MG (50000 UNIT) CAPS capsule TAKE 1 CAPSULE BY MOUTH ONE TIME PER WEEK   XARELTO 20 MG TABS tablet TAKE 1 TABLET BY MOUTH DAILY WITH SUPPER.   zolpidem (AMBIEN) 10 MG tablet TAKE 1 TABLET BY MOUTH EVERY DAY AT BEDTIME AS NEEDED   [DISCONTINUED] fluticasone-salmeterol (ADVAIR)  500-50 MCG/ACT AEPB Inhale 1 puff into the lungs in the morning and at bedtime.   fluticasone-salmeterol (ADVAIR) 500-50 MCG/ACT AEPB Inhale 1 puff into the lungs in the morning and at bedtime.   nitroGLYCERIN (NITROSTAT) 0.4 MG SL tablet Place 1 tablet (0.4 mg total) under the tongue every 5 (five) minutes as needed for chest pain.   No facility-administered encounter medications on file as of 08/15/2023.     Review of Systems  Review of Systems  N/a Physical Exam  BP 130/80 (BP Location: Left Wrist, Cuff Size: Normal)   Pulse 70   Ht 5\' 5"  (1.651 m)   Wt (!) 303 lb 12.8 oz (137.8 kg)   SpO2 93%   BMI 50.55 kg/m   Wt Readings from Last 5 Encounters:  08/15/23 (!) 303 lb 12.8 oz (137.8 kg)  07/28/23 300 lb (136.1 kg)  02/20/23 292 lb 12.8 oz (132.8 kg)  12/20/22 291 lb 3.2 oz (132.1 kg)  12/06/22 291 lb (132 kg)    BMI Readings from Last 5 Encounters:  08/15/23 50.55 kg/m  07/28/23 51.90 kg/m  02/20/23 51.05 kg/m  12/20/22 50.77 kg/m  12/06/22 50.74 kg/m     Physical Exam General: Sitting in chair, no acute distress Eyes: EOMI, no icterus Neck: Supple, no JVP appreciated Pulmonary: Distant, clear Cardiovascular: Tachycardic, regular rhythm Abdomen: Nondistended, bowel sounds present MSK: No synovitis, no joint effusion Neuro: Normal gait with assistance of rollator, no weakness Psych: Normal mood, full affect   Assessment & Plan:   Presumed pulmonary hypertension: Based on serial echocardiograms, signs of pulmonary hypertension and RV dysfunction dating back to 01/2017.  This was during the time of acute PE.  Unchanged in 01/2019.  Largely unchanged 10/2022, most recently.  Possible etiologies include group 3 disease in the setting of OHS/OSA and nonadherence to CPAP as well as mild interstitial changes on CT hi res 11/2022. Group 4 disease felt unlikely given normal VQ scan 11/2022, notably does have a history of PE in 2018. Group 1 disease, PAH, is always  possible.  Given discovery of pulmonary fibrosis, systemic vasodilators probably not in her best interest.  Difficult to tell how much it will help her in general in terms of inhaled vasodilators.  Given her aversion to CPAP therapies, do not recommend treatment for pulmonary hypertension with medications without treatment of OSA.  OSA: Based on home sleep test 2020.  Not adherent to CPAP.  Discussed role of dental evaluation.  She is precontemplative.  In the past, discussed we will need to treat OSA in order to effectively treat presumed pulmonary hypertension.  Cough: Worsened over last couple months.  Markedly improved although not resolved after steroid therapy.  Suspect related to asthma, cough variant asthma.  Cough is markedly improved with high-dose Advair discus.  Asthma: Dyspnea exertion and cough improved with Advair therapy.  High-dose Advair refilled today.  Stepdown therapy discussed, consider in the future.  ILD: Very mild on imaging, faint linear basilar opacity persist on prone imaging 11/2022.  IPF favored.  Discussed role and rationale for antifibrotic therapy.  She does not wish to pursue this at this time, reconvene in the coming months and consider starting  at that time.   Return in about 6 months (around 02/13/2024).   Karren Burly, MD 08/15/2023

## 2023-08-22 ENCOUNTER — Encounter (HOSPITAL_BASED_OUTPATIENT_CLINIC_OR_DEPARTMENT_OTHER): Payer: Self-pay

## 2023-08-22 ENCOUNTER — Emergency Department (HOSPITAL_BASED_OUTPATIENT_CLINIC_OR_DEPARTMENT_OTHER): Payer: Medicare Other

## 2023-08-22 ENCOUNTER — Other Ambulatory Visit: Payer: Self-pay

## 2023-08-22 ENCOUNTER — Emergency Department (HOSPITAL_BASED_OUTPATIENT_CLINIC_OR_DEPARTMENT_OTHER)
Admission: EM | Admit: 2023-08-22 | Discharge: 2023-08-23 | Disposition: A | Discharge status: Home / Self Care | Payer: Medicare Other | Attending: Emergency Medicine | Admitting: Emergency Medicine

## 2023-08-22 DIAGNOSIS — I10 Essential (primary) hypertension: Secondary | ICD-10-CM | POA: Insufficient documentation

## 2023-08-22 DIAGNOSIS — Z86711 Personal history of pulmonary embolism: Secondary | ICD-10-CM | POA: Insufficient documentation

## 2023-08-22 DIAGNOSIS — B9689 Other specified bacterial agents as the cause of diseases classified elsewhere: Secondary | ICD-10-CM | POA: Diagnosis not present

## 2023-08-22 DIAGNOSIS — J181 Lobar pneumonia, unspecified organism: Secondary | ICD-10-CM | POA: Insufficient documentation

## 2023-08-22 DIAGNOSIS — N390 Urinary tract infection, site not specified: Secondary | ICD-10-CM | POA: Insufficient documentation

## 2023-08-22 DIAGNOSIS — R0981 Nasal congestion: Secondary | ICD-10-CM | POA: Diagnosis not present

## 2023-08-22 DIAGNOSIS — Z79899 Other long term (current) drug therapy: Secondary | ICD-10-CM | POA: Diagnosis not present

## 2023-08-22 DIAGNOSIS — H9202 Otalgia, left ear: Secondary | ICD-10-CM | POA: Diagnosis not present

## 2023-08-22 DIAGNOSIS — E119 Type 2 diabetes mellitus without complications: Secondary | ICD-10-CM | POA: Insufficient documentation

## 2023-08-22 DIAGNOSIS — J189 Pneumonia, unspecified organism: Secondary | ICD-10-CM

## 2023-08-22 DIAGNOSIS — N200 Calculus of kidney: Secondary | ICD-10-CM | POA: Diagnosis not present

## 2023-08-22 DIAGNOSIS — Z7901 Long term (current) use of anticoagulants: Secondary | ICD-10-CM | POA: Insufficient documentation

## 2023-08-22 DIAGNOSIS — I2510 Atherosclerotic heart disease of native coronary artery without angina pectoris: Secondary | ICD-10-CM | POA: Insufficient documentation

## 2023-08-22 DIAGNOSIS — R109 Unspecified abdominal pain: Secondary | ICD-10-CM | POA: Diagnosis not present

## 2023-08-22 DIAGNOSIS — N1330 Unspecified hydronephrosis: Secondary | ICD-10-CM | POA: Diagnosis not present

## 2023-08-22 LAB — COMPREHENSIVE METABOLIC PANEL
ALT: 20 U/L (ref 0–44)
AST: 19 U/L (ref 15–41)
Albumin: 3.8 g/dL (ref 3.5–5.0)
Alkaline Phosphatase: 86 U/L (ref 38–126)
Anion gap: 12 (ref 5–15)
BUN: 24 mg/dL — ABNORMAL HIGH (ref 8–23)
CO2: 26 mmol/L (ref 22–32)
Calcium: 9.3 mg/dL (ref 8.9–10.3)
Chloride: 99 mmol/L (ref 98–111)
Creatinine, Ser: 1.36 mg/dL — ABNORMAL HIGH (ref 0.44–1.00)
GFR, Estimated: 42 mL/min — ABNORMAL LOW (ref >60)
Glucose, Bld: 153 mg/dL — ABNORMAL HIGH (ref 70–99)
Potassium: 3.8 mmol/L (ref 3.5–5.1)
Sodium: 137 mmol/L (ref 135–145)
Total Bilirubin: 0.6 mg/dL (ref 0.3–1.2)
Total Protein: 7.5 g/dL (ref 6.5–8.1)

## 2023-08-22 LAB — CBC
HCT: 40.7 % (ref 36.0–46.0)
Hemoglobin: 13 g/dL (ref 12.0–15.0)
MCH: 28 pg (ref 26.0–34.0)
MCHC: 31.9 g/dL (ref 30.0–36.0)
MCV: 87.5 fL (ref 80.0–100.0)
Platelets: 288 10*3/uL (ref 150–400)
RBC: 4.65 MIL/uL (ref 3.87–5.11)
RDW: 13.7 % (ref 11.5–15.5)
WBC: 15.8 10*3/uL — ABNORMAL HIGH (ref 4.0–10.5)
nRBC: 0 % (ref 0.0–0.2)

## 2023-08-22 LAB — LIPASE, BLOOD: Lipase: 23 U/L (ref 11–51)

## 2023-08-22 MED ORDER — DOXYCYCLINE HYCLATE 100 MG PO CAPS: 100.0000 mg | ORAL_CAPSULE | Freq: Two times a day (BID) | ORAL | 0 refills | Status: DC

## 2023-08-22 MED ORDER — DOXYCYCLINE HYCLATE 100 MG PO TABS
100.0000 mg | ORAL_TABLET | Freq: Once | ORAL | Status: AC
  Administered 2023-08-22: 100 mg via ORAL
  Filled 2023-08-22: qty 1

## 2023-08-22 MED ORDER — ONDANSETRON 4 MG PO TBDP
4.0000 mg | ORAL_TABLET | Freq: Once | ORAL | Status: AC | PRN
  Administered 2023-08-22: 4 mg via ORAL
  Filled 2023-08-22: qty 1

## 2023-08-22 MED ORDER — ONDANSETRON 4 MG PO TBDP: ORAL_TABLET | ORAL | 0 refills | Status: AC

## 2023-08-22 MED ORDER — HYDROCODONE-ACETAMINOPHEN 5-325 MG PO TABS
1.0000 | ORAL_TABLET | ORAL | 0 refills | Status: DC | PRN
Start: 2023-08-22 — End: 2023-08-25

## 2023-08-22 MED ORDER — FENTANYL CITRATE PF 50 MCG/ML IJ SOSY
50.0000 ug | PREFILLED_SYRINGE | Freq: Once | INTRAMUSCULAR | Status: AC
  Administered 2023-08-22: 50 ug via INTRAVENOUS
  Filled 2023-08-22: qty 1

## 2023-08-22 NOTE — ED Notes (Signed)
Instructed in needing u/a, denies at present

## 2023-08-22 NOTE — ED Triage Notes (Signed)
Patient states she has a kidney stone. States she has had nausea and vomiting that started at 1130am. Also reports she been able to pee small amounts. Reports flank back and abdominal pain on the right side. States she was told in 2014 she had a non-obstructing kidney stone on the right side, but has never had symptoms related to this. Patient take blood thinners, but has not been able to take any due to the vomiting.

## 2023-08-22 NOTE — ED Provider Notes (Signed)
[place] [facility] Provider Note   CSN: [id number] Arrival date & time: 08/22/23  1938     History  Chief Complaint  Patient presents with   Flank Pain    Tashonna Descoteaux is a 72 y.o. female.  Patient is a 72 year old female with a history of kidney stones, diabetes, coronary artery disease, hypertension, GERD, prior PE on Xarelto who presents with pain in her right flank area.  That started about 1130 this morning.  It has been constant since that time.  She had some associated nausea and vomiting.  No urinary symptoms.  No change in her stools.  No known fevers.  No history of similar symptoms in the past she is status postcholecystectomy and partial bowel resection.  She also has had little bit of congestion and some discomfort in her left ear and wants that checked as well.       Home Medications Prior to Admission medications   Medication Sig Start Date End Date Taking? Authorizing Provider  ACCU-CHEK GUIDE test strip USE TO TEST TWICE DAILY 03/06/23   [name], MD  acetaminophen (TYLENOL) 500 MG tablet Take 500 mg by mouth every 6 (six) hours as needed.    [provider]  albuterol (VENTOLIN HFA) 108 (90 Base) MCG/ACT inhaler INHALE 2 PUFFS INTO THE LUNGS EVERY 6 (SIX) HOURS AS NEEDED FOR WHEEZING OR SHORTNESS OF BREATH 07/06/22   [name], MD  ALPRAZolam Prudy Feeler) 0.5 MG tablet TAKE 1 TABLET BY MOUTH TWICE A DAY AS NEEDED 01/05/23   [name], MD  atorvastatin (LIPITOR) 80 MG tablet TAKE 1 TABLET BY MOUTH DAILY AT 6 PM. 07/10/23   Gaston Islam., NP  cetirizine (ZYRTEC) 10 MG tablet Take 10 mg by mouth at bedtime.    [provider]  Cholecalciferol (VITAMIN D3) 5000 units CAPS Take 5,000 Units by mouth daily.    [provider]  clobetasol ointment (TEMOVATE) 0.05 % Apply 1 application topically 2 (two) times daily as needed (skin irritation).    [provider]  EPINEPHrine  0.3 mg/0.3 mL IJ SOAJ injection Inject 0.3 mLs (0.3 mg total) into the muscle as needed for anaphylaxis. 12/06/19   [name], MD  escitalopram (LEXAPRO) 10 MG tablet TAKE 1 TABLET BY MOUTH EVERY DAY 11/30/22   [name], MD  famotidine (PEPCID) 10 MG tablet Take 10 mg by mouth at bedtime.    [provider]  fluticasone-salmeterol (ADVAIR) 500-50 MCG/ACT AEPB Inhale 1 puff into the lungs in the morning and at bedtime. 08/15/23   Hunsucker, Lesia Sago, MD  furosemide (LASIX) 20 MG tablet Take 1 tablet (20 mg total) by mouth 2 (two) times daily. 09/06/22   Gaston Islam., NP  furosemide (LASIX) 40 MG tablet TAKE 1 TABLET BY MOUTH EVERY DAY 07/10/23   Worthy Rancher B, FNP  levothyroxine (SYNTHROID) 200 MCG tablet TAKE 1 TABLET BY MOUTH EVERY DAY ON EMPTY STOMACH BEFORE BREAKFAST 07/10/23   Worthy Rancher B, FNP  magnesium oxide (MAG-OX) 400 MG tablet Take 1 tablet by mouth on Tuesdays, Thursdays, Saturdays, & Sundays.  Take 1 tablet by mouth twice a day on Mondays, Wednesdays, & Fridays Patient taking differently: Take 400 mg by mouth See admin instructions. Take 400 mg by mouth once daily on Tuesdays, Thursdays, Saturdays, & Sundays and take 400 mg twice a day on Mondays, Wednesdays, & Fridays 03/19/20   Leone Brand, NP  metoprolol tartrate (LOPRESSOR) 25 MG tablet TAKE 1 TABLET BY MOUTH EVERY DAY IN THE MORNING 07/10/23   Worthy Rancher B, FNP  montelukast (SINGULAIR) 10 MG tablet TAKE 1 TABLET BY MOUTH EVERYDAY AT BEDTIME 04/03/23   [name], MD  Multiple Vitamins-Minerals (PRESERVISION AREDS 2 PO) Take 1 capsule by mouth daily.    [provider]  nitroGLYCERIN (NITROSTAT) 0.4 MG SL tablet Place 1 tablet (0.4 mg total) under the tongue every 5 (five) minutes as needed for chest pain. 01/15/19 04/18/22  Marguerita Merles Latif, DO  olmesartan (BENICAR) 40 MG tablet TAKE 1 TABLET BY MOUTH EVERY DAY 07/10/23   Worthy Rancher B, FNP  traZODone (DESYREL) 150 MG tablet TAKE 1 TABLET BY  MOUTH EVERYDAY AT BEDTIME 06/04/23   [name], MD  Vitamin D, Ergocalciferol, (DRISDOL) 1.25 MG (50000 UNIT) CAPS capsule TAKE 1 CAPSULE BY MOUTH ONE TIME PER WEEK 07/10/23   Worthy Rancher B, FNP  XARELTO 20 MG TABS tablet TAKE 1 TABLET BY MOUTH DAILY WITH SUPPER. 08/04/23   Gaston Islam., NP  zolpidem (AMBIEN) 10 MG tablet TAKE 1 TABLET BY MOUTH EVERY DAY AT BEDTIME AS NEEDED 08/04/23   [name], MD      Allergies    Aspirin, Lisinopril, Bee venom, and Morphine and codeine    Review of Systems   Review of Systems  Constitutional:  Negative for chills, diaphoresis, fatigue and fever.  HENT:  Negative for congestion, rhinorrhea and sneezing.   Eyes: Negative.   Respiratory:  Negative for cough, chest tightness and shortness of breath.   Cardiovascular:  Negative for chest pain and leg swelling.  Gastrointestinal:  Positive for abdominal pain, nausea and vomiting. Negative for blood in stool and diarrhea.  Genitourinary:  Positive for flank pain. Negative for difficulty urinating, frequency and hematuria.  Musculoskeletal:  Positive for back pain. Negative for arthralgias.  Skin:  Negative for rash.  Neurological:  Negative for dizziness, speech difficulty, weakness, numbness and headaches.    Physical Exam Updated Vital Signs BP (!) 177/83   Pulse 85   Temp 97.8 F (36.6 C)   Resp 20   Ht 5\' 5"  (1.651 m)   Wt 136.1 kg   SpO2 90%   BMI 49.92 kg/m  Physical Exam Constitutional:      Appearance: She is well-developed.  HENT:     Head: Normocephalic and atraumatic.     Left Ear: Tympanic membrane normal.     Mouth/Throat:     Pharynx: No oropharyngeal exudate or posterior oropharyngeal erythema.  Eyes:     Pupils: Pupils are equal, round, and reactive to light.  Cardiovascular:     Rate and Rhythm: Normal rate and regular rhythm.     Heart sounds: Normal heart sounds.  Pulmonary:     Effort: Pulmonary effort is normal. No respiratory distress.     Breath  sounds: Normal breath sounds. No wheezing or rales.  Chest:     Chest wall: No tenderness.  Abdominal:     General: Bowel sounds are normal.     Palpations: Abdomen is soft.     Tenderness: There is abdominal tenderness (Mild tenderness in the right flank/mid abdomen, no rash). There is no guarding or rebound.  Musculoskeletal:        General: Normal range of motion.     Cervical back: Normal range of motion and neck supple.  Lymphadenopathy:     Cervical: No cervical adenopathy.  Skin:  General: Skin is warm and dry.     Findings: No rash.  Neurological:     Mental Status: She is alert and oriented to person, place, and time.     ED Results / Procedures / Treatments   Labs (all labs ordered are listed, but only abnormal results are displayed) Labs Reviewed  CBC - Abnormal; Notable for the following components:      Result Value   WBC 15.8 (*)    All other components within normal limits  COMPREHENSIVE METABOLIC PANEL - Abnormal; Notable for the following components:   Glucose, Bld 153 (*)    BUN 24 (*)    Creatinine, Ser 1.36 (*)    GFR, Estimated 42 (*)    All other components within normal limits  LIPASE, BLOOD  URINALYSIS, ROUTINE W REFLEX MICROSCOPIC    EKG None  Radiology CT Renal Stone Study  Result Date: 08/22/2023 CLINICAL DATA:  Abdominal pain.  Concern for kidney stone. EXAM: CT ABDOMEN AND PELVIS WITHOUT CONTRAST TECHNIQUE: Multidetector CT imaging of the abdomen and pelvis was performed following the standard protocol without IV contrast. RADIATION DOSE REDUCTION: This exam was performed according to the departmental dose-optimization program which includes automated exposure control, adjustment of the mA and/or kV according to patient size and/or use of iterative reconstruction technique. COMPARISON:  CT abdomen pelvis dated 05/07/2013. FINDINGS: Evaluation of this exam is limited in the absence of intravenous contrast. Lower chest: Clusters of ground-glass  and nodular density in the right middle lobe consistent with pneumonia. Aspiration is not excluded. Clinical correlation and follow-up to resolution recommended. No intra-abdominal free air or free fluid. Hepatobiliary: The liver is unremarkable. No biliary dilatation. Cholecystectomy. Pancreas: Unremarkable. No pancreatic ductal dilatation or surrounding inflammatory changes. Spleen: Normal in size without focal abnormality. Adrenals/Urinary Tract: The adrenal glands are unremarkable. Mild bilateral renal parenchyma atrophy. Indeterminate 4 cm hypodense lesion from the lateral interpolar right kidney, demonstrates fluid attenuation, likely a cyst. Further evaluation with ultrasound on a nonemergent/outpatient basis recommended. A 2 mm and a punctate calculi noted in the distal right ureter. There is mild right hydronephrosis. There is no hydronephrosis or nephrolithiasis on the left. The left ureter is unremarkable. The urinary bladder is minimally distended and grossly unremarkable. Stomach/Bowel: Postsurgical changes of the sigmoid colon with anastomotic suture. There is no bowel obstruction or active inflammation. The appendix is normal. Vascular/Lymphatic: Mild atherosclerotic calcification of the abdominal aorta. The IVC is unremarkable. No portal venous gas. There is no adenopathy. Reproductive: Hysterectomy.  No adnexal masses. Other: Midline vertical anterior pelvic wall incisional scar. Musculoskeletal: Degenerative changes of the spine. No acute osseous pathology. IMPRESSION: 1. A 2 mm and a punctate calculi in the distal right ureter with mild right hydronephrosis. 2. Right middle lobe pneumonia. 3. No bowel obstruction. Normal appendix. 4.  Aortic Atherosclerosis (ICD10-I70.0). Electronically Signed   By: Elgie Collard M.D.   On: 08/22/2023 22:16    Procedures Procedures    Medications Ordered in ED Medications  ondansetron (ZOFRAN-ODT) disintegrating tablet 4 mg (4 mg Oral Given 08/22/23  2029)  fentaNYL (SUBLIMAZE) injection 50 mcg (50 mcg Intravenous Given 08/22/23 2204)  fentaNYL (SUBLIMAZE) injection 50 mcg (50 mcg Intravenous Given 08/22/23 2237)  doxycycline (VIBRA-TABS) tablet 100 mg (100 mg Oral Given 08/22/23 2311)    ED Course/ Medical Decision Making/ A&P  Medical Decision Making Amount and/or Complexity of Data Reviewed Labs: ordered. Radiology: ordered.  Risk Prescription drug management.   Patient is a 73 year old female who presents with right flank pain.  CT scan shows a small right ureteral stone.  Her pain has improved with treatment here in the ED.  Her creatinine is mildly elevated but based on chart review is similar to prior values.  Awaiting urinalysis.  Her CT scan did show right middle lobe pneumonia.  She has some underlying pulmonary fibrosis and says that she always has coughing but does not seem to be too much worse than her baseline.  No fevers.  Will start her on doxycycline.  Her symptoms have overall improved and she is tolerating oral fluids.  I suspect if her urinalysis is normal without signs of infection, she can likely be discharged home with outpatient follow-up with urology.  Will give her prescriptions for Vicodin for pain and the doxycycline.  Care turned over to Dr. Bernette Mayers pending reassessment after urinalysis.  Final Clinical Impression(s) / ED Diagnoses Final diagnoses:  Kidney stone  Community acquired pneumonia of right middle lobe of lung    Rx / DC Orders ED Discharge Orders     None         Rolan Bucco, MD 08/22/23 2329

## 2023-08-22 NOTE — ED Notes (Signed)
ED Provider at bedside. 

## 2023-08-23 DIAGNOSIS — N200 Calculus of kidney: Secondary | ICD-10-CM | POA: Diagnosis not present

## 2023-08-23 LAB — URINALYSIS, ROUTINE W REFLEX MICROSCOPIC
Bilirubin Urine: NEGATIVE
Glucose, UA: NEGATIVE mg/dL
Ketones, ur: 15 mg/dL — AB
Nitrite: NEGATIVE
Protein, ur: 100 mg/dL — AB
Specific Gravity, Urine: >=1.03 (ref 1.005–1.030)
pH: 5.5 (ref 5.0–8.0)

## 2023-08-23 LAB — URINALYSIS, MICROSCOPIC (REFLEX): RBC / HPF: >50 RBC/hpf (ref 0–5)

## 2023-08-23 MED ORDER — LEVOFLOXACIN 750 MG PO TABS: 750.0000 mg | ORAL_TABLET | Freq: Every day | ORAL | 0 refills | Status: DC

## 2023-08-23 NOTE — ED Provider Notes (Signed)
Care assumed at shift change. Here for flank pain, has renal stone and RML PNA on CT. Awaiting UA.  Physical Exam  BP (!) 177/83   Pulse 85   Temp 97.8 F (36.6 C)   Resp 20   Ht 5\' 5"  (1.651 m)   Wt 136.1 kg   SpO2 90%   BMI 49.92 kg/m   Physical Exam  Procedures  Procedures  ED Course / MDM   Clinical Course as of 08/23/23 0049  Wed Aug 23, 2023  0044 UA with signs of infection. Given her CT showing RML pneumonia, will change Abx to Levaquin to cover both. Otherwise she is feeling better and ready to go home.  [CS]    Clinical Course User Index [CS] Pollyann Savoy, MD   Medical Decision Making Problems Addressed: Community acquired pneumonia of right middle lobe of lung: acute illness or injury Kidney stone: acute illness or injury Lower urinary tract infectious disease: acute illness or injury  Amount and/or Complexity of Data Reviewed Labs: ordered. Decision-making details documented in ED Course. Radiology: ordered and independent interpretation performed. Decision-making details documented in ED Course.  Risk Prescription drug management. Parenteral controlled substances.         Pollyann Savoy, MD 08/23/23 [phone]

## 2023-08-24 LAB — URINE CULTURE: Culture: NO GROWTH

## 2023-08-25 ENCOUNTER — Encounter: Payer: Self-pay | Admitting: Internal Medicine

## 2023-08-25 ENCOUNTER — Ambulatory Visit (INDEPENDENT_AMBULATORY_CARE_PROVIDER_SITE_OTHER): Payer: Medicare Other | Admitting: Internal Medicine

## 2023-08-25 VITALS — BP 90/60 | HR 58 | Temp 98.1°F | Ht 65.0 in | Wt 298.0 lb

## 2023-08-25 DIAGNOSIS — E119 Type 2 diabetes mellitus without complications: Secondary | ICD-10-CM

## 2023-08-25 DIAGNOSIS — E1169 Type 2 diabetes mellitus with other specified complication: Secondary | ICD-10-CM | POA: Diagnosis not present

## 2023-08-25 DIAGNOSIS — R10A1 Flank pain, right side: Secondary | ICD-10-CM

## 2023-08-25 DIAGNOSIS — I10 Essential (primary) hypertension: Secondary | ICD-10-CM

## 2023-08-25 DIAGNOSIS — E785 Hyperlipidemia, unspecified: Secondary | ICD-10-CM | POA: Diagnosis not present

## 2023-08-25 DIAGNOSIS — R109 Unspecified abdominal pain: Secondary | ICD-10-CM

## 2023-08-25 DIAGNOSIS — J8410 Pulmonary fibrosis, unspecified: Secondary | ICD-10-CM | POA: Diagnosis not present

## 2023-08-25 DIAGNOSIS — G4733 Obstructive sleep apnea (adult) (pediatric): Secondary | ICD-10-CM | POA: Diagnosis not present

## 2023-08-25 DIAGNOSIS — Z7901 Long term (current) use of anticoagulants: Secondary | ICD-10-CM | POA: Diagnosis not present

## 2023-08-25 DIAGNOSIS — I2722 Pulmonary hypertension due to left heart disease: Secondary | ICD-10-CM | POA: Diagnosis not present

## 2023-08-25 DIAGNOSIS — I5022 Chronic systolic (congestive) heart failure: Secondary | ICD-10-CM

## 2023-08-25 DIAGNOSIS — R319 Hematuria, unspecified: Secondary | ICD-10-CM

## 2023-08-25 DIAGNOSIS — Z86711 Personal history of pulmonary embolism: Secondary | ICD-10-CM

## 2023-08-25 LAB — POCT URINALYSIS DIP (CLINITEK)
Bilirubin, UA: NEGATIVE
Glucose, UA: NEGATIVE mg/dL
Ketones, POC UA: NEGATIVE mg/dL
Leukocytes, UA: NEGATIVE
Nitrite, UA: NEGATIVE
POC PROTEIN,UA: NEGATIVE
Spec Grav, UA: 1.01 (ref 1.010–1.025)
Urobilinogen, UA: 0.2 U/dL
pH, UA: 7 (ref 5.0–8.0)

## 2023-08-25 MED ORDER — OXYCODONE-ACETAMINOPHEN 5-325 MG PO TABS
1.0000 | ORAL_TABLET | Freq: Three times a day (TID) | ORAL | 0 refills | Status: AC | PRN
Start: 2023-08-25 — End: 2023-08-30

## 2023-08-25 NOTE — Progress Notes (Signed)
Patient Care Team: [name], MD as PCP - General (Internal Medicine) Lyn Records, MD (Inactive) as PCP - Cardiology (Cardiology) Avel Peace, MD as Consulting Physician (General Surgery) Iva Boop, MD as Consulting Physician (Gastroenterology)  Visit Date: 08/25/23  Subjective:    Patient ID: [name] , Female   DOB: 1951/06/14, 72 y.o.    MRN: [id number]   72 y.o. Female presents today for a hospital/ED follow-up. Seen in [facility] ED on 08/22/23 for right flank pain. She had onset of lower back pain, nausea on the morning of 08/22/23. CT showed small right ureteral stone, right middle lobe pneumonia. 08/23/23 urinalysis showed cloudy urine, large HGB, ketones at 15, protein at 100, trace leukocytes. Urine culture showed no growth. Given Zofran, fentanyl, doxycycline. Creatinine at 1.36. Discharged with levothyroxine 750 mg daily. History of kidney stones. Has a follow-up with urology on 08/28/23. Currently, she is having weakness, intermittent sharp pain in her kidney area that resolves immediately. Does not feel like she has pneumonia. Denies dysuria, vaginal itching.  11/29/22 CT chest showed: 1) Mild pulmonary fibrosis in a pattern apical to basal gradient, featuring irregular peripheral interstitial opacity, septal thickening, associated ground-glass, and small areas of subpleural bronchiolectasis at the bilateral lung bases. No clear evidence of honeycombing. Findings are categorized as probable UIP per consensus guidelines, 2) Gross enlargement of the main pulmonary artery, as can be seen in pulmonary hypertension, 3) Coronary artery disease.  Past Medical History:  Diagnosis Date   Anemia    none recently   Anxiety    Arthritis    Asthma    Complete tear of right rotator cuff 12/05/2014   Coronary artery disease    Depression    Diabetes mellitus    Last A1C was 6.2 on 09/18/2020   Diverticulitis    Diverticulosis    Family history of  adverse reaction to anesthesia    mom  nausea and vomiting   GERD (gastroesophageal reflux disease)    History of kidney stones    seen on scan, has not been a problem.   Hypertension    Hypothyroidism    Migraine    Personal history of kidney stones    Pneumonia    hx   Pulmonary embolism (HCC)    a. diagnosed in 01/2017 w/ imaging showing multiple right lung PE with right heart strain. Started on Xarelto.    Shortness of breath dyspnea    exersion   Thyroid disease    hypo   Wears glasses      Family History  Problem Relation Age of Onset   Heart disease Mother    Kidney failure Father    Diabetes Father    Breast cancer Paternal Aunt    Colon cancer Neg Hx     Social History   Social History Narrative   Social history: She is divorced.  Does not smoke.  Occasional alcohol consumption.  Longstanding history of morbid obesity.  Weight in 1996 was 307 pounds.  She retired as Public librarian at [employer].       Family history: Father deceased with history of stroke, hypertension, diabetes and COPD.  Mother deceased with history of congestive heart failure and chronic kidney disease.  1 sister and 1 son in good health.      Review of Systems  Constitutional:  Negative for fever and malaise/fatigue.  HENT:  Negative for congestion.   Eyes:  Negative for  blurred vision.  Respiratory:  Negative for cough and shortness of breath.   Cardiovascular:  Negative for chest pain, palpitations and leg swelling.  Gastrointestinal:  Negative for vomiting.  Genitourinary:  Positive for flank pain. Negative for dysuria.       (-) Vaginal itching  Musculoskeletal:  Negative for back pain.  Skin:  Negative for rash.  Neurological:  Positive for weakness. Negative for loss of consciousness and headaches.        Objective:   Vitals: BP 90/60   Pulse (!) 58   Temp 98.1 F (36.7 C)   Ht 5\' 5"  (1.651 m)   Wt 298 lb (135.2 kg)   SpO2 95%   BMI 49.59  kg/m    Physical Exam Vitals and nursing note reviewed.  Constitutional:      General: She is not in acute distress.    Appearance: Normal appearance. She is not toxic-appearing.  HENT:     Head: Normocephalic and atraumatic.  Pulmonary:     Effort: Pulmonary effort is normal. No respiratory distress.     Breath sounds: Normal breath sounds. No wheezing or rales.  Skin:    General: Skin is warm and dry.  Neurological:     Mental Status: She is alert and oriented to person, place, and time. Mental status is at baseline.  Psychiatric:        Mood and Affect: Mood normal.        Behavior: Behavior normal.        Thought Content: Thought content normal.        Judgment: Judgment normal.       Results:   Studies obtained and personally reviewed by me:  08/22/23 CT showed small right ureteral stone,  possible right middle lobe pneumonia.   11/29/22 CT chest showed: 1) Mild pulmonary fibrosis in a pattern apical to basal gradient, featuring irregular peripheral interstitial opacity, septal thickening, associated ground-glass, and small areas of subpleural bronchiolectasis at the bilateral lung bases. No clear evidence of honeycombing. Findings are categorized as probable UIP per consensus guidelines, 2) Gross enlargement of the main pulmonary artery, as can be seen in pulmonary hypertension, 3) Coronary artery disease.  Labs:       Component Value Date/Time   NA 137 08/22/2023 2026   NA 141 09/13/2022 1208   K 3.8 08/22/2023 2026   CL 99 08/22/2023 2026   CO2 26 08/22/2023 2026   GLUCOSE 153 (H) 08/22/2023 2026   BUN 24 (H) 08/22/2023 2026   BUN 22 09/13/2022 1208   CREATININE 1.36 (H) 08/22/2023 2026   CREATININE 1.11 (H) 11/29/2022 0943   CALCIUM 9.3 08/22/2023 2026   PROT 7.5 08/22/2023 2026   PROT 6.6 05/08/2019 1013   ALBUMIN 3.8 08/22/2023 2026   ALBUMIN 4.0 05/08/2019 1013   AST 19 08/22/2023 2026   ALT 20 08/22/2023 2026   ALKPHOS 86 08/22/2023 2026   BILITOT  0.6 08/22/2023 2026   BILITOT 0.4 05/08/2019 1013   GFRNONAA 42 (L) 08/22/2023 2026   GFRNONAA 48 (L) 04/27/2021 1240   GFRAA 56 (L) 04/27/2021 1240     Lab Results  Component Value Date   WBC 15.8 (H) 08/22/2023   HGB 13.0 08/22/2023   HCT 40.7 08/22/2023   MCV 87.5 08/22/2023   PLT 288 08/22/2023    Lab Results  Component Value Date   CHOL 96 07/25/2023   HDL 47 (L) 07/25/2023   LDLCALC 33 07/25/2023   TRIG 77 07/25/2023  CHOLHDL 2.0 07/25/2023    Lab Results  Component Value Date   HGBA1C 6.1 (H) 07/25/2023     Lab Results  Component Value Date   TSH 1.64 11/29/2022      Assessment & Plan:   Small right ureteral stone / UTI: 08/25/23 urinalysis showed large blood. Urine culture ordered. Prescribed oxycodone-acetaminophen every 8 hours as needed for severe pain. 5 day course. Follow up with Urologist.  HTN- BP low today continue to monitor at home. Says she fells ok  Morbid obesity- longstanding  Hx of asthma  Chronic cough followed by Pulmonary  Hx of sleep apnea- not using CPAP at present  Type 2 Diabetes mellitus. Needs medication  Hypothyroidism-on thyroid replacement medication  Hx of CAD- is to be assigned new Cardiologist as Dr. Garnette Scheuermann retired  Hx of PE- is on chronic anticoagulation  Primary pulmonary Hypertension  Osteopenia  Osteoarthritis  Hx diverticulitis  s/p left colectomy 2014  GERD  Allergic rhinitis-positive skin test to dust mites  Due for colonoscopy. Has possible inflammatory bowel disease based on colonoscopy 2009. Last colonoscopy was 2014. She needs to contact Gastroenterology for follow up but prefers to defer this appt at this time.  Hypothyroidism- on thyroid replacement medication  I,Alexander Ruley,acting as a scribe for [name], MD.,have documented all relevant documentation on the behalf of [name], MD,as directed by  [name], MD while in the presence of [name], MD.   I, [name], MD, have reviewed all documentation for this visit. The documentation on 09/09/23 for the exam, diagnosis, procedures, and orders are all accurate and complete.

## 2023-08-25 NOTE — Patient Instructions (Addendum)
Keep appt with Urology. Urine cultured today. Stop Doxycycline. Continue course of Levaquin. Sent in Oxycodone 10/325 to take sparingly every 8 hours for right flank pain.  Due for CPE March 2025

## 2023-08-26 LAB — URINE CULTURE
MICRO NUMBER:: 15584539
Result:: NO GROWTH
SPECIMEN QUALITY:: ADEQUATE

## 2023-08-28 ENCOUNTER — Encounter: Payer: Self-pay | Admitting: Urology

## 2023-08-28 ENCOUNTER — Ambulatory Visit (INDEPENDENT_AMBULATORY_CARE_PROVIDER_SITE_OTHER): Payer: Medicare Other | Admitting: Urology

## 2023-08-28 VITALS — BP 122/79 | HR 57 | Ht 65.0 in | Wt 300.0 lb

## 2023-08-28 DIAGNOSIS — N201 Calculus of ureter: Secondary | ICD-10-CM | POA: Diagnosis not present

## 2023-08-28 LAB — URINALYSIS, ROUTINE W REFLEX MICROSCOPIC
Bilirubin, UA: NEGATIVE
Glucose, UA: NEGATIVE
Ketones, UA: NEGATIVE
Leukocytes,UA: NEGATIVE
Nitrite, UA: NEGATIVE
Protein,UA: NEGATIVE
Specific Gravity, UA: 1.02 (ref 1.005–1.030)
Urobilinogen, Ur: 0.2 mg/dL (ref 0.2–1.0)
pH, UA: 5.5 (ref 5.0–7.5)

## 2023-08-28 LAB — MICROSCOPIC EXAMINATION

## 2023-08-28 NOTE — Progress Notes (Signed)
Assessment: 1. Ureteral calculus; right     Plan: I personally reviewed the patient's chart including provider notes, lab and imaging results. I personally reviewed the CT study from 08/22/2023 with results as noted below. Given the stone size and location, I advised her that she likely has a >90% chance of spontaneous passage. Diagnosis and treatment options for a ureteral calculus including spontaneous stone passage and ureteroscopic stone manipulation were discussed with [name] in detail. She has elected to attempt to pass the ureteral calculus. Strain urine and Pain medication as needed.  Rx provided. Follow-up in 2 weeks Return to office or ER for uncontrolled pain, vomiting, fever, or chills.   Chief Complaint:  Chief Complaint  Patient presents with   Nephrolithiasis    History of Present Illness:  [name] is a 72 y.o. female who is seen in consultation from [name], MD for evaluation of a right ureteral calculus. She presented to the ER on 08/22/23 with right flank pain.  WBC 15.8K.  Cr 1.36. Urine culture: no growth CT urogram from 08/22/23 showed a 2 mm calculus in the right distal ureter with mild right hydronephrosis, and a 4 cm hypodense lesion in the right kidney consistent with a cyst. She has not had any significant pain since her ER visit.  She does report occasional mild discomfort in the right flank.  No dysuria or gross hematuria.  She is not aware of passing a stone but has not been straining her urine on a regular basis.  She has not required any pain medication in the past week.  No nausea or vomiting, no fever or chills.  CT imaging from 2014 showed a small right sided renal calculus.   Past Medical History:  Past Medical History:  Diagnosis Date   Anemia    none recently   Anxiety    Arthritis    Asthma    Complete tear of right rotator cuff 12/05/2014   Coronary artery disease    Depression    Diabetes mellitus     Last A1C was 6.2 on 09/18/2020   Diverticulitis    Diverticulosis    Family history of adverse reaction to anesthesia    mom  nausea and vomiting   GERD (gastroesophageal reflux disease)    History of kidney stones    seen on scan, has not been a problem.   Hypertension    Hypothyroidism    Migraine    Personal history of kidney stones    Pneumonia    hx   Pulmonary embolism (HCC)    a. diagnosed in 01/2017 w/ imaging showing multiple right lung PE with right heart strain. Started on Xarelto.    Shortness of breath dyspnea    exersion   Thyroid disease    hypo   Wears glasses     Past Surgical History:  Past Surgical History:  Procedure Laterality Date   ABDOMINAL HYSTERECTOMY     CATARACT EXTRACTION, BILATERAL     CESAREAN SECTION     CHOLECYSTECTOMY N/A 02/16/2021   Procedure: LAPAROSCOPIC CHOLECYSTECTOMY;  Surgeon: Abigail Miyamoto, MD;  Location: [facility];  Service: General;  Laterality: N/A;   COLONOSCOPY     multiple    CORONARY PRESSURE/FFR STUDY N/A 01/14/2019   Procedure: INTRAVASCULAR PRESSURE WIRE/FFR STUDY;  Surgeon: Lyn Records, MD;  Location: [facility];  Service: Cardiovascular;  Laterality: N/A;   ESOPHAGOGASTRODUODENOSCOPY ENDOSCOPY     multiple   KNEE CARTILAGE  SURGERY     Left   LAPAROSCOPIC PARTIAL COLECTOMY N/A 04/22/2013   Procedure: LAPAROSCOPIC PARTIAL COLECTOMY;  Surgeon: Adolph Pollack, MD;  Location: [facility];  Service: General;  Laterality: N/A;   LEFT HEART CATH AND CORONARY ANGIOGRAPHY N/A 01/14/2019   Procedure: LEFT HEART CATH AND CORONARY ANGIOGRAPHY;  Surgeon: Lyn Records, MD;  Location: [facility];  Service: Cardiovascular;  Laterality: N/A;   right foot surgery  little toe and next toe   x 3   SHOULDER ARTHROSCOPY WITH ROTATOR CUFF REPAIR AND SUBACROMIAL DECOMPRESSION Right 12/05/2014   Procedure: RIGHT SHOULDER ARTHROSCOPY,ACROMOPLASTY, ROTATOR CUFF REPAIR;  Surgeon: Eulas Post, MD;  Location: [facility];  Service: Orthopedics;  Laterality: Right;    Allergies:  Allergies  Allergen Reactions   Aspirin Shortness Of Breath   Lisinopril Anaphylaxis and Other (See Comments)    Possibly Lisinopril or Levaquin, pt was taking both at the time of reaction   Bee Venom Swelling   Morphine And Codeine Itching and Other (See Comments)    Needs benadryl prior to     Family History:  Family History  Problem Relation Age of Onset   Heart disease Mother    Kidney failure Father    Diabetes Father    Breast cancer Paternal Aunt    Colon cancer Neg Hx     Social History:  Social History   Tobacco Use   Smoking status: Former    Current packs/day: 0.00    Average packs/day: 0.5 packs/day for 6.0 years (3.0 ttl pk-yrs)    Types: Cigarettes    Start date: 11/14/1976    Quit date: 11/14/1982    Years since quitting: 40.8   Smokeless tobacco: Never   Tobacco comments:    quit smoking 30 years ago  Vaping Use   Vaping status: Never Used  Substance Use Topics   Alcohol use: No   Drug use: No    Review of symptoms:  Constitutional:  Negative for unexplained weight loss, night sweats, fever, chills ENT:  Negative for nose bleeds, sinus pain, painful swallowing CV:  Negative for chest pain, shortness of breath, exercise intolerance, palpitations, loss of consciousness Resp:  Negative for cough, wheezing, shortness of breath GI:  Negative for nausea, vomiting, diarrhea, bloody stools GU:  Positives noted in HPI; otherwise negative for gross hematuria, dysuria, urinary incontinence Neuro:  Negative for seizures, poor balance, limb weakness, slurred speech Psych:  Negative for lack of energy, depression, anxiety Endocrine:  Negative for polydipsia, polyuria, symptoms of hypoglycemia (dizziness, hunger, sweating) Hematologic:  Negative for anemia, purpura, petechia, prolonged or excessive bleeding, use of anticoagulants  Allergic:  Negative for difficulty breathing or choking as a result  of exposure to anything; no shellfish allergy; no allergic response (rash/itch) to materials, foods  Physical exam: BP 122/79   Pulse (!) 57   Ht 5\' 5"  (1.651 m)   Wt 300 lb (136.1 kg)   BMI 49.92 kg/m  GENERAL APPEARANCE:  Well appearing, well developed, well nourished, NAD HEENT: Atraumatic, Normocephalic, oropharynx clear. NECK: Supple without lymphadenopathy or thyromegaly. LUNGS: Clear to auscultation bilaterally. HEART: Regular Rate and Rhythm without murmurs, gallops, or rubs. ABDOMEN: Soft, non-tender, No Masses. EXTREMITIES: Moves all extremities well.  Without clubbing, cyanosis, or edema. NEUROLOGIC:  Alert and oriented x 3, normal gait, CN II-XII grossly intact.  MENTAL STATUS:  Appropriate. BACK:  Non-tender to palpation.  No CVAT SKIN:  Warm, dry and intact.  Results: U/A:  3-10 RBC, 0 WBC

## 2023-09-09 ENCOUNTER — Encounter: Payer: Self-pay | Admitting: Internal Medicine

## 2023-09-12 ENCOUNTER — Ambulatory Visit (INDEPENDENT_AMBULATORY_CARE_PROVIDER_SITE_OTHER): Payer: Medicare Other | Admitting: Urology

## 2023-09-12 ENCOUNTER — Encounter: Payer: Self-pay | Admitting: Urology

## 2023-09-12 VITALS — BP 115/74 | HR 67

## 2023-09-12 DIAGNOSIS — Z87442 Personal history of urinary calculi: Secondary | ICD-10-CM

## 2023-09-12 DIAGNOSIS — N201 Calculus of ureter: Secondary | ICD-10-CM | POA: Diagnosis not present

## 2023-09-12 DIAGNOSIS — N281 Cyst of kidney, acquired: Secondary | ICD-10-CM | POA: Diagnosis not present

## 2023-09-12 LAB — URINALYSIS, ROUTINE W REFLEX MICROSCOPIC
Bilirubin, UA: NEGATIVE
Glucose, UA: NEGATIVE
Ketones, UA: NEGATIVE
Leukocytes,UA: NEGATIVE
Nitrite, UA: NEGATIVE
Protein,UA: NEGATIVE
RBC, UA: NEGATIVE
Specific Gravity, UA: >1.03 — ABNORMAL HIGH (ref 1.005–1.030)
Urobilinogen, Ur: 0.2 mg/dL (ref 0.2–1.0)
pH, UA: 5.5 (ref 5.0–7.5)

## 2023-09-12 NOTE — Progress Notes (Signed)
Assessment: 1. Ureteral calculus; right   2. Renal cyst,right     Plan: Given the small stone size, I think it is very likely that she spontaneously passed the stone.  She has been asymptomatic for the past 2 weeks. Recommend continued observation at this time. Stone prevention discussed and information provided. Renal ultrasound for further evaluation of the right renal cyst seen on CT imaging. Will call with results.  Chief Complaint:  Chief Complaint  Patient presents with   Nephrolithiasis    History of Present Illness:  [name] is a 72 y.o. female who is seen for further evaluation of a right ureteral calculus. She presented to the ER on 08/22/23 with right flank pain.  WBC 15.8K.  Cr 1.36. Urine culture: no growth CT urogram from 08/22/23 showed a 2 mm calculus in the right distal ureter with mild right hydronephrosis, and a 4 cm hypodense lesion in the right kidney consistent with a cyst. She has not had any significant pain since her ER visit.  She has had occasional mild discomfort in the right flank.  No dysuria or gross hematuria.  She is not aware of passing a stone but has not been straining her urine on a regular basis.  She has not required any pain medication recently.  No nausea or vomiting, no fever or chills.  CT imaging from 2014 showed a small right sided renal calculus.  She returns today for follow-up.  She has not had any flank pain since her last visit.  No dysuria or gross hematuria.  She is not aware of passing a stone but has not been straining her urine consistently.  Portions of the above documentation were copied from a prior visit for review purposes only.  Past Medical History:  Past Medical History:  Diagnosis Date   Anemia    none recently   Anxiety    Arthritis    Asthma    Complete tear of right rotator cuff 12/05/2014   Coronary artery disease    Depression    Diabetes mellitus    Last A1C was 6.2 on 09/18/2020    Diverticulitis    Diverticulosis    Family history of adverse reaction to anesthesia    mom  nausea and vomiting   GERD (gastroesophageal reflux disease)    History of kidney stones    seen on scan, has not been a problem.   Hypertension    Hypothyroidism    Migraine    Personal history of kidney stones    Pneumonia    hx   Pulmonary embolism (HCC)    a. diagnosed in 01/2017 w/ imaging showing multiple right lung PE with right heart strain. Started on Xarelto.    Shortness of breath dyspnea    exersion   Thyroid disease    hypo   Wears glasses     Past Surgical History:  Past Surgical History:  Procedure Laterality Date   ABDOMINAL HYSTERECTOMY     CATARACT EXTRACTION, BILATERAL     CESAREAN SECTION     CHOLECYSTECTOMY N/A 02/16/2021   Procedure: LAPAROSCOPIC CHOLECYSTECTOMY;  Surgeon: Abigail Miyamoto, MD;  Location: [facility];  Service: General;  Laterality: N/A;   COLONOSCOPY     multiple    CORONARY PRESSURE/FFR STUDY N/A 01/14/2019   Procedure: INTRAVASCULAR PRESSURE WIRE/FFR STUDY;  Surgeon: Lyn Records, MD;  Location: [facility];  Service: Cardiovascular;  Laterality: N/A;   ESOPHAGOGASTRODUODENOSCOPY ENDOSCOPY     multiple  KNEE CARTILAGE SURGERY     Left   LAPAROSCOPIC PARTIAL COLECTOMY N/A 04/22/2013   Procedure: LAPAROSCOPIC PARTIAL COLECTOMY;  Surgeon: Adolph Pollack, MD;  Location: [facility];  Service: General;  Laterality: N/A;   LEFT HEART CATH AND CORONARY ANGIOGRAPHY N/A 01/14/2019   Procedure: LEFT HEART CATH AND CORONARY ANGIOGRAPHY;  Surgeon: Lyn Records, MD;  Location: [facility];  Service: Cardiovascular;  Laterality: N/A;   right foot surgery  little toe and next toe   x 3   SHOULDER ARTHROSCOPY WITH ROTATOR CUFF REPAIR AND SUBACROMIAL DECOMPRESSION Right 12/05/2014   Procedure: RIGHT SHOULDER ARTHROSCOPY,ACROMOPLASTY, ROTATOR CUFF REPAIR;  Surgeon: Eulas Post, MD;  Location: [facility];  Service: Orthopedics;   Laterality: Right;    Allergies:  Allergies  Allergen Reactions   Aspirin Shortness Of Breath   Lisinopril Anaphylaxis and Other (See Comments)    Possibly Lisinopril or Levaquin, pt was taking both at the time of reaction   Bee Venom Swelling   Morphine And Codeine Itching and Other (See Comments)    Needs benadryl prior to     Family History:  Family History  Problem Relation Age of Onset   Heart disease Mother    Kidney failure Father    Diabetes Father    Breast cancer Paternal Aunt    Colon cancer Neg Hx     Social History:  Social History   Tobacco Use   Smoking status: Former    Current packs/day: 0.00    Average packs/day: 0.5 packs/day for 6.0 years (3.0 ttl pk-yrs)    Types: Cigarettes    Start date: 11/14/1976    Quit date: 11/14/1982    Years since quitting: 40.8   Smokeless tobacco: Never   Tobacco comments:    quit smoking 30 years ago  Vaping Use   Vaping status: Never Used  Substance Use Topics   Alcohol use: No   Drug use: No    ROS: Constitutional:  Negative for fever, chills, weight loss CV: Negative for chest pain, previous MI, hypertension Respiratory:  Negative for shortness of breath, wheezing, sleep apnea, frequent cough GI:  Negative for nausea, vomiting, bloody stool, GERD  Physical exam: BP 115/74   Pulse 67  GENERAL APPEARANCE:  Well appearing, well developed, well nourished, NAD HEENT:  Atraumatic, normocephalic, oropharynx clear NECK:  Supple without lymphadenopathy or thyromegaly ABDOMEN:  Soft, non-tender, no masses EXTREMITIES:  Moves all extremities well, without clubbing, cyanosis, or edema NEUROLOGIC:  Alert and oriented x 3, normal gait, CN II-XII grossly intact MENTAL STATUS:  appropriate BACK:  Non-tender to palpation, No CVAT SKIN:  Warm, dry, and intact  Results: U/A: Negative

## 2023-09-22 ENCOUNTER — Ambulatory Visit (HOSPITAL_BASED_OUTPATIENT_CLINIC_OR_DEPARTMENT_OTHER)
Admission: RE | Admit: 2023-09-22 | Discharge: 2023-09-22 | Disposition: A | Discharge status: Home / Self Care | Payer: Medicare Other | Source: Ambulatory Visit | Attending: Urology | Admitting: Urology

## 2023-09-22 ENCOUNTER — Other Ambulatory Visit: Payer: Self-pay | Admitting: Medical Genetics

## 2023-09-22 DIAGNOSIS — N281 Cyst of kidney, acquired: Secondary | ICD-10-CM | POA: Diagnosis not present

## 2023-09-22 DIAGNOSIS — Z006 Encounter for examination for normal comparison and control in clinical research program: Secondary | ICD-10-CM

## 2023-09-25 ENCOUNTER — Encounter: Payer: Self-pay | Admitting: Urology

## 2023-10-01 ENCOUNTER — Other Ambulatory Visit: Payer: Self-pay | Admitting: Internal Medicine

## 2023-10-01 ENCOUNTER — Other Ambulatory Visit: Payer: Self-pay | Admitting: Nurse Practitioner

## 2023-10-05 DIAGNOSIS — Z23 Encounter for immunization: Secondary | ICD-10-CM | POA: Diagnosis not present

## 2023-10-28 ENCOUNTER — Other Ambulatory Visit: Payer: Self-pay | Admitting: Internal Medicine

## 2023-11-02 ENCOUNTER — Other Ambulatory Visit: Payer: Self-pay | Admitting: Internal Medicine

## 2024-01-02 ENCOUNTER — Other Ambulatory Visit: Payer: Self-pay | Admitting: Family

## 2024-01-06 ENCOUNTER — Other Ambulatory Visit: Payer: Self-pay | Admitting: Internal Medicine

## 2024-01-08 ENCOUNTER — Other Ambulatory Visit: Payer: Self-pay | Admitting: Family

## 2024-01-10 NOTE — Progress Notes (Signed)
 Patient Care Team: [name], MD as PCP - General (Internal Medicine) Lyn Records, MD (Inactive) as PCP - Cardiology (Cardiology) Avel Peace, MD as Consulting Physician (General Surgery) Iva Boop, MD as Consulting Physician (Gastroenterology)  Visit Date: 01/16/24  Subjective:  Patient: [name], Female DOB: 1951/04/13, 73 y.o. MRN: [id number] [name] is a 73 y.o. Female who presents today for her Medicare Wellness + Office Visit. Patient has history of Anemia, Anxiety/Depression, Arthritis, Asthma, Complete Tear of Right Rotator Cuff 2016, Coronary Artery Disease, Type 2 Diabetes Mellitus, Diverticulosis, GERD, Kidney Stones, Hypertension, Hypothyroidism, Migraine Headaches, Pneumonia, Pulmonary Embolism in 2018.  Says that she tends to have a lot of congestion. Notes waxing and waning burning/stinging in her left leg.    History of Chronic Cough treated with Advair high-dose Diskus. Followed by Dr. Vilma Meckel w/ Pulmonology who she last saw 08/2023. Not adherent to CPAP according to that note. 11/29/2022 CT chest high resolution showed: 1) mild pulmonary fibrosis, 2) gross enlargement of the main pulmonary artery as can be seen in pulmonary hypertension, 3) coronary artery disease. Previously stated that cough has occurred daily since the Summer of 2023 and described her cough as sounding wet but is non-productive and most severe in the mornings and with exertion.   History of Allergies & Asthma treated with montelukast 10 mg at bedtime, albuterol inhaler as needed, cetirizine 10 mg at bedtime.    History of Diabetes Mellitus, type II 01/16/2024 HgbA1c 6.3, compared to 07/25/2023 at 6.1% on 07/25/23, from 6.3% on 11/29/22.    History of Hyperlipidemia treated with atorvastatin 80 mg daily. Lipid Panel, compared to 07/25/2023: HDL 42, decreased from 47. Hepatic Function WNL.   Previously was followed by Dr. Verdis Prime regarding her cardiac  problems, but since he has retired it seems she has established with Tobi Bastos, MD with [facility] in [place]. EKG 07/25/2023 with Sinus Rhythm, 1st Degree AV Block. Non-specific T-wave. 10/2022 echocardiogram showed left atrial size was normal in size, right atrial size was normal in size, no evidence of pericardial effusion, mitral valve is grossly normal, no evidence of mitral valve regurgitation, tricuspid valve is grossly normal. Tricuspid valve regurgitation is mild, aortic valve is tricuspid. Aortic valve regurgitation is not visualized, pulmonic valve was normal in structure. Pulmonic valve regurgitation is not visualized, aortic root and ascending aorta are structurally normal, with no evidence of dilatation, inferior vena cava is normal in size with greater than 50% respiratory variability, suggesting right atrial pressure of 3 mmHg, no atrial level shunt detected by color flow Doppler.  History Hypertension treated with Olmesartan 40 mg daily, Metoprolol tartrate 25 mg daily. History of edema treated with Lasix 40 mg daily. Blood Pressure: normotensive today at 120/80.   History of Hypothyroidism treated with Levothyroxine 200 mcg daily. TSH: 0.90.   History of Anxiety/Depression treated with Alprazolam 0.5 mg twice daily as needed, Escitalopram 10 mg daily, Trazodone 150 mg at bedtime.  History of Insomnia treated with Zolpidem 10 mg at bedtime as needed.   History of Shoulder & Knee Pain. This has been stable recently.    History of GERD treated with famotidine 10 mg at bedtime.    History of Vitamin-D Deficiency treated with Drisdol 50,000 units weekly.     CBC: reviewed CMP: reviewed  Mammogram 04/13/2022 normal with repeat recommendation of 2025.  Overdue for Colonoscopy since 2024, postponed until 07/14/2024. Last completed 03/28/2013 with severe diverticulosis in sigmoid colon w/ associated luminal  narrowing & muscular hypertrophy, some edema, no diverticulitis; otherwise  normal.  Bone Density 04/13/2022 Right Femur Neck T-score -2.4, osteopenic. with repeat recommendation of 2025.  Vaccine Counseling: Due for Covid-19 and Tdap; UTD on Flu and PNA.  Past Medical History:  Diagnosis Date   Anemia    none recently   Anxiety    Arthritis    Asthma    Complete tear of right rotator cuff 12/05/2014   Coronary artery disease    Depression    Diabetes mellitus    Last A1C was 6.2 on 09/18/2020   Diverticulitis    Diverticulosis    Family history of adverse reaction to anesthesia    mom  nausea and vomiting   GERD (gastroesophageal reflux disease)    History of kidney stones    seen on scan, has not been a problem.   Hypertension    Hypothyroidism    Migraine    Personal history of kidney stones    Pneumonia    hx   Pulmonary embolism (HCC)    a. diagnosed in 01/2017 w/ imaging showing multiple right lung PE with right heart strain. Started on Xarelto.    Shortness of breath dyspnea    exersion   Thyroid disease    hypo   Wears glasses   Medical/Surgical History Narrative:   2024 - Seen in [facility] ED on 08/22/23 for right flank pain. She had onset of lower back pain, nausea on the morning of 08/22/23. WBC 15.8K.  Cr 1.36. Urine culture: no growth. CT Urogram from 08/22/23 showed a 2 mm calculus in the right distal ureter with mild right hydronephrosis, and a 4 cm hypodense lesion in the right kidney consistent with a cyst. History of kidney stones (CT imaging from 2014 showed a small right sided renal calculus). Followed-up w/ Urology on 10/14 & 10/29. 09/2023 Renal Ultrasound confirms presence of a benign right renal cyst. No evidence of obstruction to the right kidney suggesting passage of the right ureteral calculus.   2022 - laparoscopic cholecystectomy by Dr. Magnus Ivan in April 2022 after presenting here in November complaining of an episode of abdominal pain triggered by eating at [facility] followed by intermittent discomfort for several  weeks.   2014 - History of diverticulitis of the proximal descending colon status post laparoscopic-assisted left colectomy June 2014 by Dr. Abbey Chatters.   Worked for years as a Warehouse manager. Says right hand hurts as well.  Likely needs to see orthopedist regarding this.   Has elevated serum creatinine that needs to be repeated when well hydrated. Could be devloping chronic kidney disease. Could be related to ARB medication. We will repeat kidney functions when well hydrated.   Addendum: Creatinine repeated on January 10th is normal with hydration   She underwent a laparoscopic cholecystectomy by Dr. Magnus Ivan in April 2022 after presenting here in November complaining of an episode of abdominal pain triggered by eating at [employer] followed by intermittent discomfort for several weeks.  She has a history of morbid obesity.  History of osteoarthritis of both knees.  History of angioedema.  Remains on chronic Xarelto therapy after having an acute saddle pulmonary embolism with acute cor pulmonale March 2018.  History of lichen planus involving the mouth which has intermittent exacerbations.  History of diverticulitis of the proximal descending colon status post laparoscopic-assisted left colectomy June 2014 by Dr. Abbey Chatters.  History of hypothyroidism.  History of right shoulder arthroscopic surgery, acromioplasty and rotator cuff repair in January 2016.  History of GE reflux, depression, insomnia, ulcerative colitis, hypokalemia, migraine headaches, asthma and history of B12 deficiency.  History of positive allergy skin test to dust mites and mold in 2009.  FEV1 was shown to improve with inhaled albuterol.  Sometimes she  wheezes and needs an inhaler.  Admitted December 2007 with syncope.  Had herpes zoster July 2007.  Hospitalized with probable viral gastroenteritis April 2011.  Surgery for chronic sinusitis November 2010.  History of gallstones and hepatic steatosis.  History  of iron deficiency anemia that resolved after hysterectomy.  History of noncardiac and nonpulmonary chest pain in 2011.  At that time she had negative cardiac enzymes, negative EKG, negative CT angio although her D-dimer was elevated.  Additional past medical history: Fractured left radial head 1995, epicondylitis right elbow in the past, strep throat January 2005, right upper lobe pneumonia March 2011, C-section 1977, abdominal hysterectomy with right salpingectomy January 1998.  History of bilateral cataract extractions.  Repair of left knee medial meniscus June 2008.  Last colonoscopy was in 2014 with 10-year follow-up recommended.  She only had diverticulosis during the study.  She had colonoscopy in 2009 showing severe colitis of the left colon.  She previously had pan endoscopy and findings were seen in the terminal ileum that were felt to possibly be inflammatory bowel disease or inflammation related to anti-inflammatory medications.  Biopsy in June 2009 revealed ulcerative colonic mucosa with granulomas.  She has been a patient in this practice since September 1996.  Dr. Garnette Scheuermann is her cardiologist.  History of subcritical LAD disease with heart catheterization 2020.  History of 60 to 70% mid LAD stenosis.  History of diabetes mellitus. Family History  Problem Relation Age of Onset   Heart disease Mother    Kidney failure Father    Diabetes Father    Breast cancer Paternal Aunt    Colon cancer Neg Hx   Family History Narrative: Social History   Social History Narrative   Social history: She is divorced.  Does not smoke.  Occasional alcohol consumption.  Longstanding history of morbid obesity.  Weight in 1996 was 307 pounds.  She retired as Public librarian at [employer].       Family history: Father deceased with history of stroke, hypertension, diabetes and COPD.  Mother deceased with history of congestive heart failure and chronic kidney disease.  1  sister and 1 son in good health.  Still driving. ROS  Objective:  Vitals: BP 120/80   Pulse (!) 58   Ht 5\' 5"  (1.651 m)   Wt 299 lb (135.6 kg)   SpO2 94%   BMI 49.76 kg/m  Physical Exam Vitals and nursing note reviewed.  Constitutional:      General: She is not in acute distress.    Appearance: Normal appearance. She is not toxic-appearing.  HENT:     Head: Normocephalic and atraumatic.  Pulmonary:     Effort: Pulmonary effort is normal.  Skin:    General: Skin is warm and dry.  Neurological:     Mental Status: She is alert and oriented to person, place, and time. Mental status is at baseline.  Psychiatric:        Mood and Affect: Mood normal.        Behavior: Behavior normal.        Thought Content: Thought content normal.        Judgment: Judgment normal.   Most Recent Functional Status Assessment:    01/16/2024  3:02 PM  In your present state of health, do you have any difficulty performing the following activities:  Hearing? 0  Vision? 0  Difficulty concentrating or making decisions? 0  Walking or climbing stairs? 1  Dressing or bathing? 0  Doing errands, shopping? 0  Preparing Food and eating ? N  Using the Toilet? N  In the past six months, have you accidently leaked urine? N  Do you have problems with loss of bowel control? N  Managing your Medications? N  Managing your Finances? N  Housekeeping or managing your Housekeeping? N   Most Recent Fall Risk Assessment:    01/16/2024    3:03 PM  Fall Risk   Falls in the past year? 0  Number falls in past yr: 0  Injury with Fall? 0  Risk for fall due to : No Fall Risks  Follow up Falls prevention discussed;Education provided;Falls evaluation completed   Most Recent Depression Screenings:    01/16/2024    3:03 PM 12/06/2022    3:05 PM  PHQ 2/9 Scores  PHQ - 2 Score 0 2  PHQ- 9 Score  2   Most Recent Cognitive Screening:    01/16/2024    3:04 PM  6CIT Screen  What Year? 0 points  What month? 0 points   What time? 0 points  Count back from 20 0 points  Months in reverse 0 points  Repeat phrase 0 points  Total Score 0 points   Results:  Studies Obtained And Personally Reviewed By Me:  Mammogram 04/13/2022 normal with repeat recommendation of 2025.  Colonoscopy 03/28/2013 with severe diverticulosis in sigmoid colon w/ associated luminal narrowing & muscular hypertrophy, some edema, no diverticulitis; otherwise normal.  Bone Density 04/13/2022 Right Femur Neck T-score -2.4, osteopenic. with repeat recommendation of 2025.  Labs:     Component Value Date/Time   NA 142 01/15/2024 1102   NA 141 09/13/2022 1208   K 5.0 01/15/2024 1102   CL 105 01/15/2024 1102   CO2 30 01/15/2024 1102   GLUCOSE 110 (H) 01/15/2024 1102   BUN 19 01/15/2024 1102   BUN 22 09/13/2022 1208   CREATININE 1.32 (H) 01/15/2024 1102   CALCIUM 10.0 01/15/2024 1102   PROT 6.9 01/15/2024 1102   PROT 6.6 05/08/2019 1013   ALBUMIN 3.8 08/22/2023 2026   ALBUMIN 4.0 05/08/2019 1013   AST 18 01/15/2024 1102   ALT 14 01/15/2024 1102   ALKPHOS 86 08/22/2023 2026   BILITOT 0.3 01/15/2024 1102   BILITOT 0.4 05/08/2019 1013   GFRNONAA 42 (L) 08/22/2023 2026   GFRNONAA 48 (L) 04/27/2021 1240   GFRAA 56 (L) 04/27/2021 1240    Lab Results  Component Value Date   WBC 9.2 01/15/2024   HGB 13.7 01/15/2024   HCT 43.1 01/15/2024   MCV 88.5 01/15/2024   PLT 262 01/15/2024   Lab Results  Component Value Date   CHOL 97 01/15/2024   HDL 42 (L) 01/15/2024   LDLCALC 35 01/15/2024   TRIG 115 01/15/2024   CHOLHDL 2.3 01/15/2024   Lab Results  Component Value Date   HGBA1C 6.3 (H) 01/15/2024    Lab Results  Component Value Date   TSH 0.90 01/15/2024     Assessment & Plan:   Orders Placed This Encounter  Procedures   Microalbumin / creatinine urine ratio   POCT URINALYSIS DIP (CLINITEK)   Urine specimen is normal.  Her complex medical issues appear to be stable including heart disease, Diabetes mellitus,  morbid obesity, low HDL cholesterol, asthma, history of ureteral calculus, history of vitamin D deficiency, osteoarthritis, pulmonary hypertension.      Annual wellness visit done today including the all of the following: Reviewed patient's Family Medical History Reviewed and updated list of patient's medical providers Assessment of cognitive impairment was done Assessed patient's functional ability Established a written schedule for health screening services Health Risk Assessent Completed and Reviewed  Discussed health benefits of physical activity, and encouraged her to engage in regular exercise appropriate for her age and condition.    I,Emily Lagle,acting as a Neurosurgeon for [name], MD.,have documented all relevant documentation on the behalf of [name], MD,as directed by  [name], MD while in the presence of [name], MD.   I, [name], MD, have reviewed all documentation for this visit. The documentation on 01/21/24 for the exam, diagnosis, procedures, and orders are all accurate and complete.

## 2024-01-15 ENCOUNTER — Other Ambulatory Visit: Payer: Medicare Other

## 2024-01-15 DIAGNOSIS — E1100 Type 2 diabetes mellitus with hyperosmolarity without nonketotic hyperglycemic-hyperosmolar coma (NKHHC): Secondary | ICD-10-CM | POA: Diagnosis not present

## 2024-01-15 DIAGNOSIS — M85851 Other specified disorders of bone density and structure, right thigh: Secondary | ICD-10-CM | POA: Diagnosis not present

## 2024-01-15 DIAGNOSIS — I5022 Chronic systolic (congestive) heart failure: Secondary | ICD-10-CM

## 2024-01-15 DIAGNOSIS — E785 Hyperlipidemia, unspecified: Secondary | ICD-10-CM | POA: Diagnosis not present

## 2024-01-15 DIAGNOSIS — Z7901 Long term (current) use of anticoagulants: Secondary | ICD-10-CM

## 2024-01-15 DIAGNOSIS — E039 Hypothyroidism, unspecified: Secondary | ICD-10-CM

## 2024-01-15 DIAGNOSIS — Z0000 Encounter for general adult medical examination without abnormal findings: Secondary | ICD-10-CM | POA: Diagnosis not present

## 2024-01-15 DIAGNOSIS — E119 Type 2 diabetes mellitus without complications: Secondary | ICD-10-CM | POA: Diagnosis not present

## 2024-01-15 DIAGNOSIS — I10 Essential (primary) hypertension: Secondary | ICD-10-CM

## 2024-01-15 DIAGNOSIS — E1169 Type 2 diabetes mellitus with other specified complication: Secondary | ICD-10-CM | POA: Diagnosis not present

## 2024-01-16 ENCOUNTER — Encounter: Payer: Self-pay | Admitting: Internal Medicine

## 2024-01-16 ENCOUNTER — Ambulatory Visit: Payer: Medicare Other | Admitting: Internal Medicine

## 2024-01-16 VITALS — BP 120/80 | HR 58 | Ht 65.0 in | Wt 299.0 lb

## 2024-01-16 DIAGNOSIS — M170 Bilateral primary osteoarthritis of knee: Secondary | ICD-10-CM | POA: Diagnosis not present

## 2024-01-16 DIAGNOSIS — G4733 Obstructive sleep apnea (adult) (pediatric): Secondary | ICD-10-CM

## 2024-01-16 DIAGNOSIS — E119 Type 2 diabetes mellitus without complications: Secondary | ICD-10-CM | POA: Diagnosis not present

## 2024-01-16 DIAGNOSIS — F32A Depression, unspecified: Secondary | ICD-10-CM

## 2024-01-16 DIAGNOSIS — E039 Hypothyroidism, unspecified: Secondary | ICD-10-CM

## 2024-01-16 DIAGNOSIS — Z7901 Long term (current) use of anticoagulants: Secondary | ICD-10-CM | POA: Diagnosis not present

## 2024-01-16 DIAGNOSIS — I2722 Pulmonary hypertension due to left heart disease: Secondary | ICD-10-CM

## 2024-01-16 DIAGNOSIS — Z0000 Encounter for general adult medical examination without abnormal findings: Secondary | ICD-10-CM | POA: Diagnosis not present

## 2024-01-16 DIAGNOSIS — F419 Anxiety disorder, unspecified: Secondary | ICD-10-CM

## 2024-01-16 DIAGNOSIS — J8410 Pulmonary fibrosis, unspecified: Secondary | ICD-10-CM

## 2024-01-16 DIAGNOSIS — E1169 Type 2 diabetes mellitus with other specified complication: Secondary | ICD-10-CM | POA: Diagnosis not present

## 2024-01-16 DIAGNOSIS — Z8679 Personal history of other diseases of the circulatory system: Secondary | ICD-10-CM

## 2024-01-16 DIAGNOSIS — E785 Hyperlipidemia, unspecified: Secondary | ICD-10-CM | POA: Diagnosis not present

## 2024-01-16 DIAGNOSIS — I5022 Chronic systolic (congestive) heart failure: Secondary | ICD-10-CM | POA: Diagnosis not present

## 2024-01-16 LAB — CBC WITH DIFFERENTIAL/PLATELET
Absolute Lymphocytes: 3284 {cells}/uL (ref 850–3900)
Absolute Monocytes: 883 {cells}/uL (ref 200–950)
Basophils Absolute: 101 {cells}/uL (ref 0–200)
Basophils Relative: 1.1 %
Eosinophils Absolute: 653 {cells}/uL — ABNORMAL HIGH (ref 15–500)
Eosinophils Relative: 7.1 %
HCT: 43.1 % (ref 35.0–45.0)
Hemoglobin: 13.7 g/dL (ref 11.7–15.5)
MCH: 28.1 pg (ref 27.0–33.0)
MCHC: 31.8 g/dL — ABNORMAL LOW (ref 32.0–36.0)
MCV: 88.5 fL (ref 80.0–100.0)
MPV: 11.3 fL (ref 7.5–12.5)
Monocytes Relative: 9.6 %
Neutro Abs: 4278 {cells}/uL (ref 1500–7800)
Neutrophils Relative %: 46.5 %
Platelets: 262 10*3/uL (ref 140–400)
RBC: 4.87 10*6/uL (ref 3.80–5.10)
RDW: 12.6 % (ref 11.0–15.0)
Total Lymphocyte: 35.7 %
WBC: 9.2 10*3/uL (ref 3.8–10.8)

## 2024-01-16 LAB — POCT URINALYSIS DIP (CLINITEK)
Bilirubin, UA: NEGATIVE
Blood, UA: NEGATIVE
Glucose, UA: NEGATIVE mg/dL
Ketones, POC UA: NEGATIVE mg/dL
Leukocytes, UA: NEGATIVE
Nitrite, UA: NEGATIVE
POC PROTEIN,UA: NEGATIVE
Spec Grav, UA: 1.02 (ref 1.010–1.025)
Urobilinogen, UA: 0.2 U/dL
pH, UA: 6 (ref 5.0–8.0)

## 2024-01-16 LAB — COMPLETE METABOLIC PANEL WITH GFR
AG Ratio: 1.3 (calc) (ref 1.0–2.5)
ALT: 14 U/L (ref 6–29)
AST: 18 U/L (ref 10–35)
Albumin: 3.9 g/dL (ref 3.6–5.1)
Alkaline phosphatase (APISO): 82 U/L (ref 37–153)
BUN/Creatinine Ratio: 14 (calc) (ref 6–22)
BUN: 19 mg/dL (ref 7–25)
CO2: 30 mmol/L (ref 20–32)
Calcium: 10 mg/dL (ref 8.6–10.4)
Chloride: 105 mmol/L (ref 98–110)
Creat: 1.32 mg/dL — ABNORMAL HIGH (ref 0.60–1.00)
Globulin: 3 g/dL (ref 1.9–3.7)
Glucose, Bld: 110 mg/dL — ABNORMAL HIGH (ref 65–99)
Potassium: 5 mmol/L (ref 3.5–5.3)
Sodium: 142 mmol/L (ref 135–146)
Total Bilirubin: 0.3 mg/dL (ref 0.2–1.2)
Total Protein: 6.9 g/dL (ref 6.1–8.1)
eGFR: 43 mL/min/{1.73_m2} — ABNORMAL LOW (ref >=60)

## 2024-01-16 LAB — LIPID PANEL
Cholesterol: 97 mg/dL (ref <200)
HDL: 42 mg/dL — ABNORMAL LOW (ref >=50)
LDL Cholesterol (Calc): 35 mg/dL
Non-HDL Cholesterol (Calc): 55 mg/dL (ref <130)
Total CHOL/HDL Ratio: 2.3 (calc) (ref <5.0)
Triglycerides: 115 mg/dL (ref <150)

## 2024-01-16 LAB — HEMOGLOBIN A1C
Hgb A1c MFr Bld: 6.3 %{Hb} — ABNORMAL HIGH (ref <5.7)
Mean Plasma Glucose: 134 mg/dL
eAG (mmol/L): 7.4 mmol/L

## 2024-01-16 LAB — TSH: TSH: 0.9 m[IU]/L (ref 0.40–4.50)

## 2024-01-16 MED ORDER — ERGOCALCIFEROL 1.25 MG (50000 UT) PO CAPS: 50000.0000 [IU] | ORAL_CAPSULE | ORAL | 3 refills | Status: DC

## 2024-01-16 MED ORDER — METOPROLOL TARTRATE 25 MG PO TABS: 25.0000 mg | ORAL_TABLET | Freq: Every day | ORAL | 3 refills | Status: AC

## 2024-01-16 MED ORDER — LEVOTHYROXINE SODIUM 200 MCG PO TABS: 200.0000 ug | ORAL_TABLET | Freq: Every day | ORAL | 3 refills | Status: AC

## 2024-01-17 LAB — MICROALBUMIN / CREATININE URINE RATIO
Creatinine, Urine: 275 mg/dL (ref 20–275)
Microalb Creat Ratio: 11 mg/g{creat} (ref <30)
Microalb, Ur: 3 mg/dL

## 2024-01-18 NOTE — Progress Notes (Signed)
 Annual Medicare Wellness Visit   Patient Care Team: [name], Tammy Cole, MD as PCP - General (Internal Medicine) Tammy Records, MD (Inactive) as PCP - Cardiology (Cardiology) Tammy Peace, MD as Consulting Physician (General Surgery) Tammy Boop, MD as Consulting Physician (Gastroenterology)  Visit Date: 01/16/24   Chief Complaint  Patient presents with   Annual Exam   Subjective:  Patient: [name], Female DOB: 28-Nov-1950, 73 y.o. MRN: [id number] [name] is a 73 y.o. Female who presents today for her Annual Medicare Wellness Visit. Patient has history of Anemia, Anxiety And Depression, Arthritis, Asthma, Complete Tear of Right Rotator Cuff 2016, Coronary Artery Disease, Type 2 Diabetes Mellitus, Diverticulosis, GERD, Kidney Stones, Hypertension, Hypothyroidism, Migraine Headaches, Pneumonia, Pulmonary Embolism 2018    History of Shoulder Pain/hx of Complete Tear of Right Rotator Cuff and Knee Pain. No complaints about either today.    History of Diabetes Mellitus type II 01/15/2024 Blood Glucose 110, decreased from 128; HgbA1c, compared to 11/29/2022: 6.3, no change. Creatinine 1.32, elevated from 1.11; eGFR 43, decreased from 53.     History of Hypertension treated with Olmesartan 40 mg daily and Metoprolol tartrate 25 mg daily. Blood Pressure: normotensive today at 120/80. Edema treated with Furosemide 40 mg daily.  History of Hyperlipidemia treated with Atorvastatin 80 mg daily. 01/15/2024 Lipid Panel, compared to 11/29/2022: HDL 42, elevated from 40; otherwise WNL.    History of Allergies treated with Zyrtec 10 mg at bedtime.   History of GERD treated with Famotidine 10 mg at bedtime.    History of Hypothyroidism treated with Levothyroxine 200 mcg daily. 01/15/2024 TSH: 0.90.    History of Anxiety/Depression treated with alprazolam 0.5 mg twice daily as needed, Escitalopram 10 mg daily, and Trazodone 150 mg at bedtime.  History of Insomnia treated  with Zolpidem 10 mg at bedtime as needed.   History of Vitamin-D Deficiency treated with Drisdol 50,000 units weekly.   History of Pulmonary Hypertension; Pulmonary Embolism in 2018 started on  Xarelto; Asthma & Chronic Cough treated with 10 mg Montelukast nightly, Advair inhaler BID, and Albuterol inhaler as needed; OSA unable to tolerate CPAP. Followed by Pulmonology Dr. Vilma Meckel, who she last saw on 08/2023. 11/29/2022 CT chest high resolution showed: 1) mild pulmonary fibrosis, 2) gross enlargement of the main pulmonary artery as can be seen in pulmonary hypertension, 3) coronary artery disease.   History of Heart Failure, had a consult with Dr. Tobi Bastos with [facility] in 07/2023 after last being evaluated by NP Robin Searing on 08/29/22. 12/23 echocardiogram showed left atrial size was normal in size, right atrial size was normal in size, no evidence of pericardial effusion, mitral valve is grossly normal, no evidence of mitral valve regurgitation, tricuspid valve is grossly normal. Tricuspid valve regurgitation is mild, aortic valve is tricuspid. Aortic valve regurgitation is not visualized, pulmonic valve was normal in structure. Pulmonic valve regurgitation is not visualized, aortic root and ascending aorta are structurally normal, with no evidence of dilatation, inferior vena cava is normal in size with greater than 50% respiratory variability, suggesting right atrial pressure of 3 mmHg, no atrial level shunt detected by color flow Doppler.  Labs 01/15/2024 CBC, compared to 11/29/2022: MCHC 31.8, slight decreased from 32.3; Absolute Eosinophils 653, elevated from 605; otherwise WNL. CMP, compared to 11/29/2022: Blood Glucose 110, decreased from 128; Creatinine 1.32, elevated from 1.11; eGFR 43, decreased from 53; otherwise WNL.   Mammogram 04/13/2022 normal with repeat recommendation of 2026.  Overdue  for Colonoscopy since 2024. Last completed 03/28/2013 with severe  Diverticulosis in the sigmoid colon.   Bone Density 04/13/2022 T score Right Femur Neck -2.4. As previously discussed, patient does not want to be on bone sparing therapy at this point.   Vaccine Counseling: Due for Covid-19 and Shingles 1/2; UTD on Flu, PNA, and Tdap. Past Medical History:  Diagnosis Date   Anemia    none recently   Anxiety    Arthritis    Asthma    Complete tear of right rotator cuff 12/05/2014   Coronary artery disease    Depression    Diabetes mellitus    Last A1C was 6.2 on 09/18/2020   Diverticulitis    Diverticulosis    Family history of adverse reaction to anesthesia    mom  nausea and vomiting   GERD (gastroesophageal reflux disease)    History of kidney stones    seen on scan, has not been a problem.   Hypertension    Hypothyroidism    Migraine    Personal history of kidney stones    Pneumonia    hx   Pulmonary embolism (HCC)    a. diagnosed in 01/2017 w/ imaging showing multiple right lung PE with right heart strain. Started on Xarelto.    Shortness of breath dyspnea    exersion   Thyroid disease    hypo   Wears glasses   Medical/Surgical History Narrative:  She underwent a laparoscopic cholecystectomy by Dr. Magnus Ivan in April 2022 after presenting here in November complaining of an episode of abdominal pain triggered by eating at [employer] followed by intermittent discomfort for several weeks.  She has a history of morbid obesity.  History of osteoarthritis of both knees.  History of angioedema.  Remains on chronic Xarelto therapy after having an acute saddle pulmonary embolism with acute cor pulmonale March 2018.  History of lichen planus involving the mouth which has intermittent exacerbations.  History of diverticulitis of the proximal descending colon status post laparoscopic-assisted left colectomy June 2014 by Dr. Abbey Chatters.  History of hypothyroidism.  History of right shoulder arthroscopic surgery, acromioplasty and rotator cuff  repair in January 2016.  History of GE reflux, depression, insomnia, ulcerative colitis, hypokalemia, migraine headaches, asthma and history of B12 deficiency.  History of positive allergy skin test to dust mites and mold in 2009. FEV1 was shown to improve with inhaled albuterol. Sometimes she wheezes and needs an inhaler.  Admitted December 2007 with syncope. Had herpes zoster July 2007. Hospitalized with probable viral gastroenteritis April 2011. Surgery for chronic sinusitis November 2010. History of gallstones and hepatic steatosis. History of iron deficiency anemia that resolved after hysterectomy. History of noncardiac and nonpulmonary chest pain in 2011. At that time she had negative cardiac enzymes, negative EKG, negative CT angio although her D-dimer was elevated.  Additional past medical history: Fractured left radial head 1995, epicondylitis right elbow in the past, strep throat January 2005, right upper lobe pneumonia March 2011, C-section 1977, abdominal hysterectomy with right salpingectomy January 1998. History of bilateral cataract extractions. Repair of left knee medial meniscus June 2008.  Family History  Problem Relation Age of Onset   Heart disease Mother    Kidney failure Father    Diabetes Father    Breast cancer Paternal Aunt    Colon cancer Neg Hx     Social History   Social History Narrative   Social history: She is divorced.  Does not smoke.  Occasional alcohol consumption.  Longstanding history  of morbid obesity.  Weight in 1996 was 307 pounds.  She retired as Public librarian at [employer].       Family history: Father deceased with history of stroke, hypertension, diabetes and COPD.  Mother deceased with history of congestive heart failure and chronic kidney disease.  1 sister and 1 son in good health.   Review of Systems  Constitutional:  Negative for chills, fever, malaise/fatigue and weight loss.  HENT:  Negative for hearing loss,  sinus pain and sore throat.   Respiratory:  Negative for cough, hemoptysis and shortness of breath.   Cardiovascular:  Negative for chest pain, palpitations, leg swelling and PND.  Gastrointestinal:  Negative for abdominal pain, constipation, diarrhea, heartburn, nausea and vomiting.  Genitourinary:  Negative for dysuria, frequency and urgency.  Musculoskeletal:  Negative for back pain, myalgias and neck pain.  Skin:  Negative for itching and rash.  Neurological:  Negative for dizziness, tingling, seizures and headaches.  Endo/Heme/Allergies:  Negative for polydipsia.  Psychiatric/Behavioral:  Negative for depression. The patient is not nervous/anxious.     Objective:  Vitals: BP 120/80   Pulse (!) 58   Ht 5\' 5"  (1.651 m)   Wt 299 lb (135.6 kg)   SpO2 94%   BMI 49.76 kg/m  Physical Exam Vitals and nursing note reviewed.  Constitutional:      General: She is not in acute distress.    Appearance: Normal appearance. She is not ill-appearing or toxic-appearing.  HENT:     Head: Normocephalic and atraumatic.     Right Ear: Hearing, tympanic membrane, ear canal and external ear normal.     Left Ear: Hearing, tympanic membrane, ear canal and external ear normal.     Mouth/Throat:     Pharynx: Oropharynx is clear.  Eyes:     Extraocular Movements: Extraocular movements intact.     Pupils: Pupils are equal, round, and reactive to light.  Neck:     Thyroid: No thyroid mass, thyromegaly or thyroid tenderness.     Vascular: No carotid bruit.  Cardiovascular:     Rate and Rhythm: Normal rate and regular rhythm. No extrasystoles are present.    Pulses:          Dorsalis pedis pulses are 1+ on the right side and 1+ on the left side.     Heart sounds: Normal heart sounds. No murmur heard.    No friction rub. No gallop.  Pulmonary:     Effort: Pulmonary effort is normal.     Breath sounds: Normal breath sounds. No decreased breath sounds, wheezing, rhonchi or rales.  Chest:     Chest  wall: No mass.  Abdominal:     Palpations: Abdomen is soft. There is no hepatomegaly, splenomegaly or mass.     Tenderness: There is no abdominal tenderness.     Hernia: No hernia is present.  Musculoskeletal:     Cervical back: Normal range of motion.     Right lower leg: No edema.     Left lower leg: No edema.  Lymphadenopathy:     Cervical: No cervical adenopathy.     Upper Body:     Right upper body: No supraclavicular adenopathy.     Left upper body: No supraclavicular adenopathy.  Skin:    General: Skin is warm and dry.  Neurological:     General: No focal deficit present.     Mental Status: She is alert and oriented to person, place, and time. Mental  status is at baseline.     Sensory: Sensation is intact.     Motor: Motor function is intact. No weakness.     Deep Tendon Reflexes: Reflexes are normal and symmetric.  Psychiatric:        Attention and Perception: Attention normal.        Mood and Affect: Mood normal.        Speech: Speech normal.        Behavior: Behavior normal.        Thought Content: Thought content normal.        Cognition and Memory: Cognition normal.        Judgment: Judgment normal.    Most Recent Functional Status Assessment:    01/16/2024    3:02 PM  In your present state of health, do you have any difficulty performing the following activities:  Hearing? 0  Vision? 0  Difficulty concentrating or making decisions? 0  Walking or climbing stairs? 1  Dressing or bathing? 0  Doing errands, shopping? 0  Preparing Food and eating ? N  Using the Toilet? N  In the past six months, have you accidently leaked urine? N  Do you have problems with loss of bowel control? N  Managing your Medications? N  Managing your Finances? N  Housekeeping or managing your Housekeeping? N   Most Recent Fall Risk Assessment:    01/16/2024    3:03 PM  Fall Risk   Falls in the past year? 0  Number falls in past yr: 0  Injury with Fall? 0  Risk for fall due to :  No Fall Risks  Follow up Falls prevention discussed;Education provided;Falls evaluation completed   Most Recent Depression Screenings:    01/16/2024    3:03 PM 12/06/2022    3:05 PM  PHQ 2/9 Scores  PHQ - 2 Score 0 2  PHQ- 9 Score  2   Most Recent Cognitive Screening:    01/16/2024    3:04 PM  6CIT Screen  What Year? 0 points  What month? 0 points  What time? 0 points  Count back from 20 0 points  Months in reverse 0 points  Repeat phrase 0 points  Total Score 0 points   Results:  Studies Obtained And Personally Reviewed By Me:  Mammogram 04/13/2022 normal.  Colonoscopy 03/28/2013 with severe Diverticulosis in the sigmoid colon.   Bone Density 04/13/2022 T-score Right Femur Neck -2.4.   Labs:     Component Value Date/Time   NA 142 01/15/2024 1102   NA 141 09/13/2022 1208   K 5.0 01/15/2024 1102   CL 105 01/15/2024 1102   CO2 30 01/15/2024 1102   GLUCOSE 110 (H) 01/15/2024 1102   BUN 19 01/15/2024 1102   BUN 22 09/13/2022 1208   CREATININE 1.32 (H) 01/15/2024 1102   CALCIUM 10.0 01/15/2024 1102   PROT 6.9 01/15/2024 1102   PROT 6.6 05/08/2019 1013   ALBUMIN 3.8 08/22/2023 2026   ALBUMIN 4.0 05/08/2019 1013   AST 18 01/15/2024 1102   ALT 14 01/15/2024 1102   ALKPHOS 86 08/22/2023 2026   BILITOT 0.3 01/15/2024 1102   BILITOT 0.4 05/08/2019 1013   GFRNONAA 42 (L) 08/22/2023 2026   GFRNONAA 48 (L) 04/27/2021 1240   GFRAA 56 (L) 04/27/2021 1240    Lab Results  Component Value Date   WBC 9.2 01/15/2024   HGB 13.7 01/15/2024   HCT 43.1 01/15/2024   MCV 88.5 01/15/2024   PLT 262 01/15/2024  Lab Results  Component Value Date   CHOL 97 01/15/2024   HDL 42 (L) 01/15/2024   LDLCALC 35 01/15/2024   TRIG 115 01/15/2024   CHOLHDL 2.3 01/15/2024   Lab Results  Component Value Date   HGBA1C 6.3 (H) 01/15/2024    Lab Results  Component Value Date   TSH 0.90 01/15/2024    Assessment & Plan:   Orders Placed This Encounter  Procedures   Microalbumin /  creatinine urine ratio   POCT URINALYSIS DIP (CLINITEK)  Other Labs Reviewed today: CBC, compared to 11/29/2022: MCHC 31.8, slight decreased from 32.3; Absolute Eosinophils 653, elevated from 605; otherwise WNL. CMP, compared to 11/29/2022: Blood Glucose 110, decreased from 128; Creatinine 1.32, elevated from 1.11; eGFR 43, decreased from 53  Shoulder Pain/hx of Complete Tear of Right Rotator Cuff and Knee Pain. No complaints about either today.    Diabetes Mellitus type II 01/15/2024 Blood Glucose 110, decreased from 128; HgbA1c, compared to 11/29/2022: 6.3, no change.   Elevated Creatinine 1.32, elevated from 1.11; eGFR 43, decreased from 53. Ordered Microalbumin/Creatinine.      Hypertension treated with Olmesartan 40 mg daily and Metoprolol tartrate 25 mg daily. Blood Pressure: normotensive today at 120/80. Edema treated with Furosemide 40 mg daily.  Hyperlipidemia treated with Atorvastatin 80 mg daily. 01/15/2024 Lipid Panel, compared to 11/29/2022: HDL 42, elevated from 40; otherwise WNL.    Allergies treated with Zyrtec 10 mg at bedtime.   GERD treated with Famotidine 10 mg at bedtime.    Hypothyroidism treated with Levothyroxine 200 mcg daily. 01/15/2024 TSH: 0.90.     Anxiety/Depression treated with alprazolam 0.5 mg twice daily as needed, Escitalopram 10 mg daily, and Trazodone 150 mg at bedtime.  Insomnia treated with Zolpidem 10 mg at bedtime as needed.   Vitamin-D Deficiency treated with Drisdol 50,000 units weekly.   Pulmonary Hypertension; Pulmonary Embolism in 2018 started on Xarelto; Asthma & Chronic Cough treated with 10 mg Montelukast nightly, Advair inhaler BID, and Albuterol inhaler as needed; OSA unable to tolerate CPAP. Followed by Pulmonology Dr. Vilma Meckel, who she last saw on 08/2023. 11/29/2022 CT chest high resolution showed: 1) mild pulmonary fibrosis, 2) gross enlargement of the main pulmonary artery as can be seen in pulmonary hypertension, 3) coronary artery  disease.   Heart Failure, had a consult with Dr. Tobi Bastos with [facility] in 07/2023 after last being evaluated by NP Robin Searing on 08/29/22. 12/23 echocardiogram showed left atrial size was normal in size, right atrial size was normal in size, no evidence of pericardial effusion, mitral valve is grossly normal, no evidence of mitral valve regurgitation, tricuspid valve is grossly normal. Tricuspid valve regurgitation is mild, aortic valve is tricuspid. Aortic valve regurgitation is not visualized, pulmonic valve was normal in structure. Pulmonic valve regurgitation is not visualized, aortic root and ascending aorta are structurally normal, with no evidence of dilatation, inferior vena cava is normal in size with greater than 50% respiratory variability, suggesting right atrial pressure of 3 mmHg, no atrial level shunt detected by color flow Doppler.  Mammogram 04/13/2022 normal with repeat recommendation of 2026.  Overdue for Colonoscopy since 2024. Last completed 03/28/2013 with severe Diverticulosis in the sigmoid colon.   Bone Density 04/13/2022 T score Right Femur Neck -2.4. As previously discussed, patient does not want to be on bone sparing therapy at this point.   Vaccine Counseling: Due for Covid-19 and Shingles 1/2; UTD on Flu, PNA, and Tdap.   Annual wellness visit done  today including the all of the following: Reviewed patient's Family Medical History Reviewed and updated list of patient's medical providers Assessment of cognitive impairment was done Assessed patient's functional ability Established a written schedule for health screening services Health Risk Assessent Completed and Reviewed  Discussed health benefits of physical activity, and encouraged her to engage in regular exercise appropriate for her age and condition.    I,Emily Lagle,acting as a Neurosurgeon for [name], MD.,have documented all relevant documentation on the behalf of [name], MD,as  directed by  [name], MD while in the presence of [name], MD.   I, [name], MD, have reviewed all documentation for this visit. The documentation on 01/21/24 for the exam, diagnosis, procedures, and orders are all accurate and complete.

## 2024-01-21 NOTE — Patient Instructions (Signed)
 It was a pleasure to see you today and good to know your medical issues seem to be under good control and stable.  Please continue current medications and follow-up in 6 months

## 2024-03-08 ENCOUNTER — Other Ambulatory Visit: Payer: Self-pay | Admitting: Internal Medicine

## 2024-03-08 NOTE — Telephone Encounter (Signed)
 Medication:  Ambien  Directions: 1 TABLET BY MOUTH EVERY DAY AT BEDTIME AS NEEDED  Last given: 08/04/2023 Number refills: 1 Last o/v: 01/16/2024 Follow up: Upcoming 07/25/2024 Labs: 01/15/2024 and 03/042025

## 2024-04-04 ENCOUNTER — Other Ambulatory Visit: Payer: Self-pay | Admitting: Internal Medicine

## 2024-04-05 ENCOUNTER — Other Ambulatory Visit: Payer: Self-pay | Admitting: Internal Medicine

## 2024-06-02 ENCOUNTER — Other Ambulatory Visit: Payer: Self-pay | Admitting: Internal Medicine

## 2024-07-10 ENCOUNTER — Other Ambulatory Visit: Payer: Self-pay | Admitting: Family

## 2024-07-23 ENCOUNTER — Other Ambulatory Visit: Payer: PRIVATE HEALTH INSURANCE

## 2024-07-23 DIAGNOSIS — E119 Type 2 diabetes mellitus without complications: Secondary | ICD-10-CM

## 2024-07-23 DIAGNOSIS — E1169 Type 2 diabetes mellitus with other specified complication: Secondary | ICD-10-CM

## 2024-07-23 DIAGNOSIS — E039 Hypothyroidism, unspecified: Secondary | ICD-10-CM

## 2024-07-24 ENCOUNTER — Ambulatory Visit: Payer: Self-pay | Admitting: Internal Medicine

## 2024-07-24 DIAGNOSIS — I272 Pulmonary hypertension, unspecified: Secondary | ICD-10-CM | POA: Diagnosis not present

## 2024-07-24 DIAGNOSIS — I10 Essential (primary) hypertension: Secondary | ICD-10-CM | POA: Diagnosis not present

## 2024-07-24 LAB — LIPID PANEL
Cholesterol: 86 mg/dL (ref <200)
HDL: 42 mg/dL — ABNORMAL LOW (ref >=50)
LDL Cholesterol (Calc): 25 mg/dL
Non-HDL Cholesterol (Calc): 44 mg/dL (ref <130)
Total CHOL/HDL Ratio: 2 (calc) (ref <5.0)
Triglycerides: 106 mg/dL (ref <150)

## 2024-07-24 LAB — HEPATIC FUNCTION PANEL
AG Ratio: 1.5 (calc) (ref 1.0–2.5)
ALT: 13 U/L (ref 6–29)
AST: 16 U/L (ref 10–35)
Albumin: 3.9 g/dL (ref 3.6–5.1)
Alkaline phosphatase (APISO): 77 U/L (ref 37–153)
Bilirubin, Direct: 0.1 mg/dL (ref 0.0–0.2)
Globulin: 2.6 g/dL (ref 1.9–3.7)
Indirect Bilirubin: 0.5 mg/dL (ref 0.2–1.2)
Total Bilirubin: 0.6 mg/dL (ref 0.2–1.2)
Total Protein: 6.5 g/dL (ref 6.1–8.1)

## 2024-07-24 LAB — HEMOGLOBIN A1C
Hgb A1c MFr Bld: 5.9 % — ABNORMAL HIGH (ref <5.7)
Mean Plasma Glucose: 123 mg/dL
eAG (mmol/L): 6.8 mmol/L

## 2024-07-24 LAB — TSH: TSH: 2.65 m[IU]/L (ref 0.40–4.50)

## 2024-07-25 ENCOUNTER — Encounter: Payer: Self-pay | Admitting: Internal Medicine

## 2024-07-25 ENCOUNTER — Ambulatory Visit: Payer: PRIVATE HEALTH INSURANCE | Admitting: Internal Medicine

## 2024-07-25 VITALS — BP 120/80 | HR 56 | Ht 65.0 in | Wt 293.0 lb

## 2024-07-25 DIAGNOSIS — M170 Bilateral primary osteoarthritis of knee: Secondary | ICD-10-CM

## 2024-07-25 DIAGNOSIS — I2722 Pulmonary hypertension due to left heart disease: Secondary | ICD-10-CM

## 2024-07-25 DIAGNOSIS — Z1211 Encounter for screening for malignant neoplasm of colon: Secondary | ICD-10-CM

## 2024-07-25 DIAGNOSIS — I10 Essential (primary) hypertension: Secondary | ICD-10-CM | POA: Diagnosis not present

## 2024-07-25 DIAGNOSIS — J8410 Pulmonary fibrosis, unspecified: Secondary | ICD-10-CM

## 2024-07-25 DIAGNOSIS — Z86711 Personal history of pulmonary embolism: Secondary | ICD-10-CM

## 2024-07-25 DIAGNOSIS — Z6841 Body Mass Index (BMI) 40.0 and over, adult: Secondary | ICD-10-CM | POA: Diagnosis not present

## 2024-07-25 DIAGNOSIS — Z8679 Personal history of other diseases of the circulatory system: Secondary | ICD-10-CM | POA: Diagnosis not present

## 2024-07-25 DIAGNOSIS — G4733 Obstructive sleep apnea (adult) (pediatric): Secondary | ICD-10-CM | POA: Diagnosis not present

## 2024-07-25 DIAGNOSIS — E1169 Type 2 diabetes mellitus with other specified complication: Secondary | ICD-10-CM | POA: Diagnosis not present

## 2024-07-25 DIAGNOSIS — F419 Anxiety disorder, unspecified: Secondary | ICD-10-CM

## 2024-07-25 DIAGNOSIS — I5022 Chronic systolic (congestive) heart failure: Secondary | ICD-10-CM

## 2024-07-25 DIAGNOSIS — Z8639 Personal history of other endocrine, nutritional and metabolic disease: Secondary | ICD-10-CM

## 2024-07-25 DIAGNOSIS — Z1231 Encounter for screening mammogram for malignant neoplasm of breast: Secondary | ICD-10-CM

## 2024-07-25 DIAGNOSIS — Z7901 Long term (current) use of anticoagulants: Secondary | ICD-10-CM

## 2024-07-25 DIAGNOSIS — E039 Hypothyroidism, unspecified: Secondary | ICD-10-CM | POA: Diagnosis not present

## 2024-07-25 DIAGNOSIS — J069 Acute upper respiratory infection, unspecified: Secondary | ICD-10-CM | POA: Diagnosis not present

## 2024-07-25 DIAGNOSIS — F5104 Psychophysiologic insomnia: Secondary | ICD-10-CM

## 2024-07-25 MED ORDER — COVID-19 MRNA VAC-TRIS(PFIZER) 30 MCG/0.3ML IM SUSY: 0.3000 mL | PREFILLED_SYRINGE | Freq: Once | INTRAMUSCULAR | 0 refills | Status: AC

## 2024-07-25 MED ORDER — COVID-19 MRNA VACC (MODERNA) 50 MCG/0.5ML IM SUSP: 0.5000 mL | Freq: Once | INTRAMUSCULAR | 0 refills | Status: AC

## 2024-07-25 NOTE — Progress Notes (Signed)
 Patient Care Team: Perri Ronal PARAS, MD as PCP - General (Internal Medicine) Claudene Victory ORN, MD (Inactive) as PCP - Cardiology (Cardiology) Lily Boas, MD as Consulting Physician (General Surgery) Avram Lupita BRAVO, MD as Consulting Physician (Gastroenterology)  Visit Date: 07/25/24  Subjective:    Patient ID: [name] , Female   DOB: 31-Dec-1950, 73 y.o.    MRN: [id number]   73 y.o. Female presents today for 6 month follow up for Diabetes Mellitus, type II, Hypothyroidism, Hypertension and Hyperlipidemia . Patient has a past medical history of Anemia, Anxiety and depression, Arthritis, Asthma, Complete tear of right rotator cuff 2016, CAD, Diabetes Mellitus, type II, Diverticulosis, GE reflux, Kidney stones, Hypertension, Hypothyroidism, Migraine headaches, Pnuemonia, Pulmonary emolism 2018.         Today she said she had a cough and some ear pain but denied having fever or chills. Declined getting any medication for these symptoms today.   History of Shoulder Pain/hx of Complete Tear of Right Rotator Cuff and Knee Pain.     History of Diabetes Mellitus, Type II 07/23/2024 HgbA1c 5.9% down from 01/15/2024 HgbA1c 6.3%   History of Hypertension treated with Olmesartan  40 mg daily and Metroprolol tartrate 25 mg. Blood pressure today is normal at 120/80    History of Hyperlipidemia treated with atorvastatin  80 mg daily. 07/23/2024 Lipid panel HDL 42 Otherwise WNL.  History of allergies treated with zyrtex 10 mg at bed time   History of GE Reflux treated with Famotidine  10 mg at bedtime    History of Hypothyroidism treated with Levothyroxine  200 mcg daily. 07/23/2024 TSH 2.65  History of laparoscopic cholecystectomy by Dr. Vernetta April 2022.  History of angioedema.  History of diverticulitis of proximal descending colon status post laparoscopic assisted left colectomy June 2014 by Dr. Lily.  History of right shoulder arthroscopic surgery, acromioplasty and  rotator cuff repair in January 2016.  History of lichen planus involving the mouth which has intermittent exacerbations.  Remains on chronic Xarelto  therapy after having an acute saddle pulmonary embolus with acute cor pulmonale March 2018.  History of osteoarthritis of both knees.  History of GE reflux, depression, insomnia, ulcerative colitis, hypokalemia, migraine headaches, asthma and history of B12 deficiency.  History of positive allergy skin test to dust mites and mold in 2009.  FEV1 was shown to improve with inhaled albuterol .  Sometimes she wheezes and needs an inhaler.  Past medical history: Admitted December 2007 with syncope.  Had Herpes zoster July 2007.  Hospitalized with probable viral gastroenteritis April 2011.  Surgery for chronic sinusitis November 2020.  History of gallstones and hepatic steatosis.  History of iron deficiency anemia that resolved after hysterectomy.  History of noncardiac and mild pulmonary chest pain in 2011 at that time negative cardiac enzymes, negative EKG, negative CT angio although her D-dimer was elevated.  History of fracture left radial head 1995, epicondylitis right elbow in the past, strep throat January 2005, right upper lobe pneumonia March 2011, C-section 1977, abdominal hysterectomy with right salpingectomy January 1998.  History of bilateral cataract extractions.  Repair of left knee medial meniscus tear in 2008.  Had colonoscopy 2014 with 10-year follow-up recommended.  Diverticulosis was noted at that study.  She had colonoscopy in 2009 showing severe colitis of the left colon.  She previously had pan endoscopy and findings were seen in the terminal ileum it was felt to possibly be inflammatory bowel disease or inflammation related to anti-inflammatory medication.  Biopsy in June 2009 revealed ulcerative  colonic mucosa with granulomas.  She has been a patient in this practice since September 1996.  History of subcritical LAD disease with  heart catheterization by Dr. Esmeralda Sharps in 2020.  Had 60 to 70% mild LAD stenosis.  Social history: Divorced.  Does not smoke.  Occasional alcohol consumption.  Longstanding history of morbid obesity and weight in 1996 was 307 pounds.  She retired as Public librarian at [employer]  History of Anxiety/depression treated with alprazolam  0.5 mg Twice daily as needed. Escitalopram  10 mg daily and Trazodone  150 mg at bedtime    History of Pulmonary Hypertension; Pulmonary Embolism in 2018 started on  Xarelto ; Asthma & Chronic Cough treated with 10 mg Montelukast  nightly, Advair inhaler BID, and Albuterol  inhaler as needed; OSA unable to tolerate CPAP. Followed by Pulmonology Dr. Donnice Beals, who she last saw on 08/2023. 11/29/2022 CT chest high resolution showed: 1) mild pulmonary fibrosis, 2) gross enlargement of the main pulmonary artery as can be seen in pulmonary hypertension, 3) coronary artery disease.   History of Heart Failure, had a consult with Dr. Izetta Leash with [facility] in 07/2023 after last being evaluated by NP Jackee Alberts on 08/29/22. 12/23 echocardiogram showed left atrial size was normal in size, right atrial size was normal in size, no evidence of pericardial effusion, mitral valve is grossly normal, no evidence of mitral valve regurgitation, tricuspid valve is grossly normal. Tricuspid valve regurgitation is mild, aortic valve is tricuspid. Aortic valve regurgitation is not visualized, pulmonic valve was normal in structure. Pulmonic valve regurgitation is not visualized, aortic root and ascending aorta are structurally normal, with no evidence of dilatation, inferior vena cava is normal in size with greater than 50% respiratory variability, suggesting right atrial pressure of 3 mmHg, no atrial level shunt detected by color flow Doppler.  Labs 07/23/2024  HDL 42  HgbA1c 5.9%   Vaccine counseling: Covid-19 and Influenza Vaccine due,  declined shingles vaccine    Health maintenance: Colo guard was ordered, eye exam scheduled for next week    Follow up in 6 months   Past Medical History:  Diagnosis Date   Anemia    none recently   Anxiety    Arthritis    Asthma    Complete tear of right rotator cuff 12/05/2014   Coronary artery disease    Depression    Diabetes mellitus    Last A1C was 6.2 on 09/18/2020   Diverticulitis    Diverticulosis    Family history of adverse reaction to anesthesia    mom  nausea and vomiting   GERD (gastroesophageal reflux disease)    History of kidney stones    seen on scan, has not been a problem.   Hypertension    Hypothyroidism    Migraine    Personal history of kidney stones    Pneumonia    hx   Pulmonary embolism (HCC)    a. diagnosed in 01/2017 w/ imaging showing multiple right lung PE with right heart strain. Started on Xarelto .    Shortness of breath dyspnea    exersion   Thyroid  disease    hypo   Wears glasses      Family History  Problem Relation Age of Onset   Heart disease Mother    Kidney failure Father    Diabetes Father    Breast cancer Paternal Aunt    Colon cancer Neg Hx     Social History   Social History Narrative  Social history: She is divorced.  Does not smoke.  Occasional alcohol consumption.  Longstanding history of morbid obesity.  Weight in 1996 was 307 pounds.  She retired as Public librarian at [employer].       Family history: Father deceased with history of stroke, hypertension, diabetes and COPD.  Mother deceased with history of congestive heart failure and chronic kidney disease.  1 sister and 1 son in good health.      Review of Systems  HENT:  Positive for ear pain (ear tingling).   Respiratory:  Positive for cough.         Objective:   Vitals: BP 120/80   Pulse (!) 56   Ht 5' 5 (1.651 m)   Wt 293 lb (132.9 kg)   SpO2 95%   BMI 48.76 kg/m        Results:       Labs:        Component Value Date/Time   NA 142 01/15/2024 1102   NA 141 09/13/2022 1208   K 5.0 01/15/2024 1102   CL 105 01/15/2024 1102   CO2 30 01/15/2024 1102   GLUCOSE 110 (H) 01/15/2024 1102   BUN 19 01/15/2024 1102   BUN 22 09/13/2022 1208   CREATININE 1.32 (H) 01/15/2024 1102   CALCIUM  10.0 01/15/2024 1102   PROT 6.5 07/23/2024 1227   PROT 6.6 05/08/2019 1013   ALBUMIN  3.8 08/22/2023 2026   ALBUMIN  4.0 05/08/2019 1013   AST 16 07/23/2024 1227   ALT 13 07/23/2024 1227   ALKPHOS 86 08/22/2023 2026   BILITOT 0.6 07/23/2024 1227   BILITOT 0.4 05/08/2019 1013   GFRNONAA 42 (L) 08/22/2023 2026   GFRNONAA 48 (L) 04/27/2021 1240   GFRAA 56 (L) 04/27/2021 1240     Lab Results  Component Value Date   WBC 9.2 01/15/2024   HGB 13.7 01/15/2024   HCT 43.1 01/15/2024   MCV 88.5 01/15/2024   PLT 262 01/15/2024    Lab Results  Component Value Date   CHOL 86 07/23/2024   HDL 42 (L) 07/23/2024   LDLCALC 25 07/23/2024   TRIG 106 07/23/2024   CHOLHDL 2.0 07/23/2024    Lab Results  Component Value Date   HGBA1C 5.9 (H) 07/23/2024     Lab Results  Component Value Date   TSH 2.65 07/23/2024         Assessment & Plan:   Diabetes Mellitus, Type II: 07/23/2024 HgbA1c 5.9% down from 01/15/2024 HgbA1c 6.3%    Essential Hypertension: treated with Olmesartan  40 mg daily and Metroprolol tartrate 25 mg. Blood pressure today is normal at 120/80     Mixed Hyperlipidemia: treated with atorvastatin  80 mg daily. 07/23/2024 Lipid panel HDL 42 Otherwise WNL    Hypothyroidism: treated with Levothyroxine  200 mcg daily. 07/23/2024 TSH 2.65  Chronic anticoagulation with Xarelto  without side effects.  History of pulmonary embolus  Morbid obesity- continue diet efforts. Unable to physically exercise much.  Acute upper respiratory infection-no medication was prescribed  Low HDL of 42.  Total cholesterol is 86, triglycerides 106 and LDL calculated cholesterol is 25  Anxiety and  depression-stable on trazodone , Xanax , Lexapro   Primary osteoarthritis of knees  History of reactive airways treated with albuterol  inhaler as needed.  Also on Singulair .  Chronic insomnia treated with Ambien .  Has taken this many years without side effects.  History of vitamin D  deficiency treated with high-dose vitamin D  weekly  Chronic systolic heart failure followed by Cardiology.  This is stable.  BMI 48-continue diet and exercise efforts      Labs 07/23/2024  HDL 42  HgbA1c 5.9%   Vaccine counseling: Covid-19 and Influenza Vaccine due, declined shingles vaccine    Health maintenance: Cologard  ordered, diabetic annual eye exam scheduled for next week    Follow up in 6 months  (07/23/2025).  Will be due for Medicare wellness visit and annual health maintenance exam at that time.    I,Makayla C Reid,acting as a scribe for Ronal JINNY Hailstone, MD.,have documented all relevant documentation on the behalf of Ronal JINNY Hailstone, MD,as directed by  Ronal JINNY Hailstone, MD while in the presence of Ronal JINNY Hailstone, MD.

## 2024-07-31 DIAGNOSIS — D3131 Benign neoplasm of right choroid: Secondary | ICD-10-CM | POA: Diagnosis not present

## 2024-07-31 DIAGNOSIS — Z961 Presence of intraocular lens: Secondary | ICD-10-CM | POA: Diagnosis not present

## 2024-07-31 DIAGNOSIS — H353121 Nonexudative age-related macular degeneration, left eye, early dry stage: Secondary | ICD-10-CM | POA: Diagnosis not present

## 2024-07-31 DIAGNOSIS — H43813 Vitreous degeneration, bilateral: Secondary | ICD-10-CM | POA: Diagnosis not present

## 2024-07-31 DIAGNOSIS — H18513 Endothelial corneal dystrophy, bilateral: Secondary | ICD-10-CM | POA: Diagnosis not present

## 2024-07-31 DIAGNOSIS — H04123 Dry eye syndrome of bilateral lacrimal glands: Secondary | ICD-10-CM | POA: Diagnosis not present

## 2024-07-31 DIAGNOSIS — H35371 Puckering of macula, right eye: Secondary | ICD-10-CM | POA: Diagnosis not present

## 2024-07-31 LAB — HM DIABETES EYE EXAM

## 2024-08-01 ENCOUNTER — Encounter: Payer: Self-pay | Admitting: Internal Medicine

## 2024-08-05 DIAGNOSIS — Z23 Encounter for immunization: Secondary | ICD-10-CM | POA: Diagnosis not present

## 2024-08-07 ENCOUNTER — Encounter: Payer: Self-pay | Admitting: Internal Medicine

## 2024-08-07 NOTE — Patient Instructions (Addendum)
 It was a pleasure to see you today.  Labs reviewed and are stable.  Please continue current medications and return in March for annual Medicare wellness visit and health maintenance exam.  Cologuard was ordered.  Please continue with diet and exercise efforts.  Vaccines discussed.  To have diabetic eye exam in the near future.

## 2024-08-14 ENCOUNTER — Ambulatory Visit

## 2024-08-15 ENCOUNTER — Ambulatory Visit
Admission: RE | Admit: 2024-08-15 | Discharge: 2024-08-15 | Disposition: A | Discharge status: Home / Self Care | Source: Ambulatory Visit | Attending: Internal Medicine | Admitting: Internal Medicine

## 2024-08-15 DIAGNOSIS — Z1231 Encounter for screening mammogram for malignant neoplasm of breast: Secondary | ICD-10-CM | POA: Diagnosis not present

## 2024-08-22 DIAGNOSIS — I272 Pulmonary hypertension, unspecified: Secondary | ICD-10-CM | POA: Diagnosis not present

## 2024-08-22 DIAGNOSIS — I361 Nonrheumatic tricuspid (valve) insufficiency: Secondary | ICD-10-CM | POA: Diagnosis not present

## 2024-08-22 DIAGNOSIS — I517 Cardiomegaly: Secondary | ICD-10-CM | POA: Diagnosis not present

## 2024-08-26 ENCOUNTER — Other Ambulatory Visit: Payer: Self-pay | Admitting: Internal Medicine

## 2024-08-26 ENCOUNTER — Other Ambulatory Visit: Payer: Self-pay | Admitting: Pulmonary Disease

## 2024-08-27 ENCOUNTER — Ambulatory Visit: Admitting: Pulmonary Disease

## 2024-08-29 DIAGNOSIS — Z1211 Encounter for screening for malignant neoplasm of colon: Secondary | ICD-10-CM | POA: Diagnosis not present

## 2024-09-16 ENCOUNTER — Other Ambulatory Visit: Payer: Self-pay | Admitting: Medical Genetics

## 2024-09-16 DIAGNOSIS — Z006 Encounter for examination for normal comparison and control in clinical research program: Secondary | ICD-10-CM

## 2024-09-17 ENCOUNTER — Ambulatory Visit: Payer: Self-pay | Admitting: Internal Medicine

## 2024-09-17 LAB — COLOGUARD: COLOGUARD: POSITIVE — AB

## 2024-09-19 ENCOUNTER — Other Ambulatory Visit: Payer: Self-pay

## 2024-09-19 DIAGNOSIS — R195 Other fecal abnormalities: Secondary | ICD-10-CM

## 2024-09-26 ENCOUNTER — Encounter: Payer: Self-pay | Admitting: Pulmonary Disease

## 2024-09-26 ENCOUNTER — Ambulatory Visit: Admitting: Pulmonary Disease

## 2024-09-26 VITALS — BP 140/79 | HR 59 | Ht 64.0 in | Wt 293.0 lb

## 2024-09-26 DIAGNOSIS — Z86711 Personal history of pulmonary embolism: Secondary | ICD-10-CM

## 2024-09-26 DIAGNOSIS — J4540 Moderate persistent asthma, uncomplicated: Secondary | ICD-10-CM

## 2024-09-26 DIAGNOSIS — J849 Interstitial pulmonary disease, unspecified: Secondary | ICD-10-CM | POA: Diagnosis not present

## 2024-09-26 DIAGNOSIS — J45909 Unspecified asthma, uncomplicated: Secondary | ICD-10-CM

## 2024-09-26 DIAGNOSIS — R053 Chronic cough: Secondary | ICD-10-CM

## 2024-09-26 NOTE — Progress Notes (Signed)
 @Patient  ID: [name], female    DOB: [birth year], 73 y.o.   MRN: [id number]  Chief Complaint  Patient presents with   Medical Management of Chronic Issues    Pt states all is well     Referring provider: Perri Ronal PARAS, MD  HPI:   73 y.o. woman whom we are seeing in evaluation of pulmonary hypertension, cough, and DOE.  Most recent PCP note reviewed.   Overall doing well.Cough remains markedly well-controlled on Advair high-dose Diskus.  After shared decision making decided to continue.  Discussed her CT scan in the past.  Faint possible early signs of ILD.  Symptoms not worse.  Discussed repeating images in the future.  HPI at initial visit: Patient first mentions cough today.  Started what sounds like viral illness.  Cough lingered.  Was given Solu-Medrol  shot, prednisone  taper, sounds like antibiotics.  Cough markedly improved.  Although somewhat persists.  She think steroids is most beneficial.  In general, she thinks her asthma is well-controlled.  Her as needed albuterol  has not really affected the cough positively or negatively, not really any improvement or change.  Reason for referral was pulmonary hypertension.  She had a submassive PE presumably submassive PE 01/2017 with TTE demonstrating RV dysfunction and dilated RV.  It is possible that was chronic and predated this.  On repeat TTE 01/2019 findings were similar with back the question of either chronic thromboembolic disease or preceding pulmonary hypertension that was just discovered in the setting of pulmonary embolus.  She had left heart catheterization in 2020 with mildly elevated LVEDP to 17.  She had a sleep test in 2020 that demonstrated mild apnea.  She is not able to tolerate CPAP therapy.  Not using any therapies for OSA.  Her most recent TTE 10/2022 is largely unchanged, RV dysfunction, dilated RV, dilated RA, largely unchanged dating back to 2018, 5-1/2 years prior.  We discussed the underlying  etiologies of possible pulmonary hypertension in general and as it pertains to her specific case.  We discussed the role and rationale of additional diagnostic testing up to and including formal diagnosis of port after the right heart cath.  Discussed the role and rationale for therapeutic options, the risk and benefits, adverse events etc. with pulmonary vasodilators.  Questionaires / Pulmonary Flowsheets:   ACT:  Asthma Control Test ACT Total Score  08/15/2023  3:25 PM 24  02/20/2023 11:35 AM 21    MMRC:     No data to display          Epworth:     10/22/2018   11:00 AM  Results of the Epworth flowsheet  Sitting and reading 0  Watching TV 1  Sitting, inactive in a public place (e.g. a theatre or a meeting) 0  As a passenger in a car for an hour without a break 0  Lying down to rest in the afternoon when circumstances permit 3  Sitting and talking to someone 0  Sitting quietly after a lunch without alcohol 0  In a car, while stopped for a few minutes in traffic 0  Total score 4    Tests:   FENO:  No results found for: NITRICOXIDE  PFT:     No data to display          WALK:     10/11/2022    1:48 PM  SIX MIN WALK  Supplimental Oxygen during Test? (L/min) No  Tech Comments: Pt was unable to complete 3 laps.  She stated she was tired after 2 laps. Pt walked at a regular pace without stopping or complaining of SOB for 2 laps.    Imaging: Personally reviewed and as per EMR discussion in this note No results found.  Lab Results: Personally reviewed CBC    Component Value Date/Time   WBC 9.2 01/15/2024 1102   RBC 4.87 01/15/2024 1102   HGB 13.7 01/15/2024 1102   HGB 11.9 01/23/2019 0910   HGB 12.9 09/05/2006 1321   HCT 43.1 01/15/2024 1102   HCT 37.7 01/23/2019 0910   HCT 38.2 09/05/2006 1321   PLT 262 01/15/2024 1102   PLT 348 01/23/2019 0910   MCV 88.5 01/15/2024 1102   MCV 87 01/23/2019 0910   MCV 85.1 09/05/2006 1321   MCH 28.1 01/15/2024  1102   MCHC 31.8 (L) 01/15/2024 1102   RDW 12.6 01/15/2024 1102   RDW 13.4 01/23/2019 0910   RDW 13.6 09/05/2006 1321   LYMPHSABS 2,866 11/29/2022 0943   LYMPHSABS 2.7 01/23/2019 0910   LYMPHSABS 3.1 09/05/2006 1321   MONOABS 2.3 (H) 01/15/2019 0355   MONOABS 0.7 09/05/2006 1321   EOSABS 653 (H) 01/15/2024 1102   EOSABS 0.6 (H) 01/23/2019 0910   BASOSABS 101 01/15/2024 1102   BASOSABS 0.1 01/23/2019 0910   BASOSABS 0.0 09/05/2006 1321    BMET    Component Value Date/Time   NA 142 01/15/2024 1102   NA 141 09/13/2022 1208   K 5.0 01/15/2024 1102   CL 105 01/15/2024 1102   CO2 30 01/15/2024 1102   GLUCOSE 110 (H) 01/15/2024 1102   BUN 19 01/15/2024 1102   BUN 22 09/13/2022 1208   CREATININE 1.32 (H) 01/15/2024 1102   CALCIUM  10.0 01/15/2024 1102   GFRNONAA 42 (L) 08/22/2023 2026   GFRNONAA 48 (L) 04/27/2021 1240   GFRAA 56 (L) 04/27/2021 1240    BNP    Component Value Date/Time   BNP 29 10/04/2022 1026    ProBNP    Component Value Date/Time   PROBNP 67.0 04/12/2008 0500    Specialty Problems       Pulmonary Problems   Asthma   Qualifier: Diagnosis of  By: Orlando CMA (AAMA), [place]        Allergic rhinitis   OSA (obstructive sleep apnea)    Allergies  Allergen Reactions   Aspirin  Shortness Of Breath   Lisinopril Anaphylaxis and Other (See Comments)    Possibly Lisinopril or Levaquin , pt was taking both at the time of reaction   Bee Venom Swelling   Morphine  And Codeine Itching and Other (See Comments)    Needs benadryl  prior to     Immunization History  Administered Date(s) Administered   INFLUENZA, HIGH DOSE SEASONAL PF 09/01/2017, 08/16/2018, 08/16/2019, 08/07/2020, 09/09/2022, 08/05/2024   Influenza Split 08/01/2013   Influenza-Unspecified 08/09/2012, 09/30/2015, 07/02/2016, 08/16/2019, 08/19/2021   Moderna Covid-19 Vaccine  Bivalent Booster 23yrs & up 08/19/2021, 09/09/2022   Moderna Sars-Covid-2 Vaccination 01/10/2020, 02/07/2020,  09/16/2020   PNEUMOCOCCAL CONJUGATE-20 05/24/2022   Pneumococcal Conjugate-13 10/19/2015, 07/20/2017   Pneumococcal Polysaccharide-23 05/06/1999, 09/16/2019   Zoster, Live 08/09/2012    Past Medical History:  Diagnosis Date   Anemia    none recently   Anxiety    Arthritis    Asthma    Complete tear of right rotator cuff 12/05/2014   Coronary artery disease    Depression    Diabetes mellitus    Last A1C was 6.2 on 09/18/2020   Diverticulitis    Diverticulosis  Family history of adverse reaction to anesthesia    mom  nausea and vomiting   GERD (gastroesophageal reflux disease)    History of kidney stones    seen on scan, has not been a problem.   Hypertension    Hypothyroidism    Migraine    Personal history of kidney stones    Pneumonia    hx   Pulmonary embolism (HCC)    a. diagnosed in 01/2017 w/ imaging showing multiple right lung PE with right heart strain. Started on Xarelto .    Shortness of breath dyspnea    exersion   Thyroid  disease    hypo   Wears glasses     Tobacco History: Social History   Tobacco Use  Smoking Status Former   Current packs/day: 0.00   Average packs/day: 0.5 packs/day for 6.0 years (3.0 ttl pk-yrs)   Types: Cigarettes   Start date: 11/14/1976   Quit date: 11/14/1982   Years since quitting: 41.8  Smokeless Tobacco Never  Tobacco Comments   quit smoking 30 years ago   Counseling given: Not Answered Tobacco comments: quit smoking 30 years ago   Continue to not smoke  Outpatient Encounter Medications as of 09/26/2024  Medication Sig   albuterol  (VENTOLIN  HFA) 108 (90 Base) MCG/ACT inhaler TAKE 2 PUFFS BY MOUTH EVERY 6 HOURS AS NEEDED FOR WHEEZE OR SHORTNESS OF BREATH   ALPRAZolam  (XANAX ) 0.5 MG tablet TAKE 1 TABLET BY MOUTH TWICE A DAY AS NEEDED   atorvastatin  (LIPITOR ) 80 MG tablet TAKE 1 TABLET BY MOUTH DAILY AT 6 PM.   cetirizine (ZYRTEC) 10 MG tablet Take 10 mg by mouth at bedtime.   clobetasol  ointment (TEMOVATE ) 0.05 % Apply  1 application topically 2 (two) times daily as needed (skin irritation).   EPINEPHrine  0.3 mg/0.3 mL IJ SOAJ injection Inject 0.3 mLs (0.3 mg total) into the muscle as needed for anaphylaxis.   escitalopram  (LEXAPRO ) 10 MG tablet TAKE 1 TABLET BY MOUTH EVERY DAY   famotidine  (PEPCID ) 10 MG tablet Take 10 mg by mouth at bedtime.   furosemide  (LASIX ) 40 MG tablet TAKE 1 TABLET BY MOUTH EVERY DAY   levothyroxine  (SYNTHROID ) 200 MCG tablet Take 1 tablet (200 mcg total) by mouth daily.   magnesium  oxide (MAG-OX) 400 MG tablet Take 1 tablet by mouth on Tuesdays, Thursdays, Saturdays, & Sundays.  Take 1 tablet by mouth twice a day on Mondays, Wednesdays, & Fridays (Patient taking differently: Take 400 mg by mouth See admin instructions. Take 400 mg by mouth once daily on Tuesdays, Thursdays, Saturdays, & Sundays and take 400 mg twice a day on Mondays, Wednesdays, & Fridays)   metoprolol  tartrate (LOPRESSOR ) 25 MG tablet Take 1 tablet (25 mg total) by mouth daily.   montelukast  (SINGULAIR ) 10 MG tablet TAKE 1 TABLET BY MOUTH EVERYDAY AT BEDTIME   Multiple Vitamins-Minerals (PRESERVISION AREDS 2 PO) Take 1 capsule by mouth daily.   olmesartan  (BENICAR ) 40 MG tablet TAKE 1 TABLET BY MOUTH EVERY DAY   ondansetron  (ZOFRAN -ODT) 4 MG disintegrating tablet 4mg  ODT q4 hours prn nausea/vomit   traZODone  (DESYREL ) 150 MG tablet TAKE 1 TABLET BY MOUTH EVERYDAY AT BEDTIME   WIXELA INHUB 500-50 MCG/ACT AEPB INHALE 1 PUFF INTO THE LUNGS IN THE MORNING AND AT BEDTIME.   XARELTO  20 MG TABS tablet TAKE 1 TABLET BY MOUTH DAILY WITH SUPPER.   zolpidem  (AMBIEN ) 10 MG tablet TAKE 1 TABLET BY MOUTH EVERY DAY AT BEDTIME AS NEEDED   [DISCONTINUED] ergocalciferol  (DRISDOL ) 1.25  MG (50000 UT) capsule Take 1 capsule (50,000 Units total) by mouth once a week.   [DISCONTINUED] nitroGLYCERIN  (NITROSTAT ) 0.4 MG SL tablet Place 1 tablet (0.4 mg total) under the tongue every 5 (five) minutes as needed for chest pain.   No  facility-administered encounter medications on file as of 09/26/2024.     Review of Systems  Review of Systems  N/a Physical Exam  BP (!) 140/79   Pulse (!) 59   Ht 5' 4 (1.626 m) Comment: pet pt  Wt 293 lb (132.9 kg)   SpO2 93%   BMI 50.29 kg/m   Wt Readings from Last 5 Encounters:  09/26/24 293 lb (132.9 kg)  07/25/24 293 lb (132.9 kg)  01/16/24 299 lb (135.6 kg)  08/28/23 300 lb (136.1 kg)  08/25/23 298 lb (135.2 kg)    BMI Readings from Last 5 Encounters:  09/26/24 50.29 kg/m  07/25/24 48.76 kg/m  01/16/24 49.76 kg/m  08/28/23 49.92 kg/m  08/25/23 49.59 kg/m     Physical Exam General: Sitting in chair, no acute distress Eyes: EOMI, no icterus Neck: Supple, no JVP appreciated Pulmonary: Distant, clear Cardiovascular: Tachycardic, regular rhythm Abdomen: Nondistended, bowel sounds present MSK: No synovitis, no joint effusion Neuro: Normal gait with assistance of rollator, no weakness Psych: Normal mood, full affect   Assessment & Plan:   Presumed pulmonary hypertension: Based on serial echocardiograms, signs of pulmonary hypertension and RV dysfunction dating back to 01/2017.  This was during the time of acute PE.  Unchanged in 01/2019.  Largely unchanged 10/2022, most recently.  Possible etiologies include group 3 disease in the setting of OHS/OSA and nonadherence to CPAP as well as mild interstitial changes on CT hi res 11/2022. Group 4 disease felt unlikely given normal VQ scan 11/2022, notably does have a history of PE in 2018. Group 1 disease, PAH, is always possible.  Given discovery of pulmonary fibrosis, systemic vasodilators probably not in her best interest.  Difficult to tell how much it will help her in general in terms of inhaled vasodilators.  Given her aversion to CPAP therapies, do not recommend treatment for pulmonary hypertension with medications without treatment of OSA.  Asthma with chronic cough: DOE and cough improved on high dose Wixela.  To continue.   ILD: Very mild on imaging, faint linear basilar opacity persist on prone imaging 11/2022.  Nothing significant in lung bases CT renal study 10/24. Consider repeat imaging 2 year interval 08/2025. She has previously declined anti-fibrotic.    Return in about 6 months (around 03/26/2025) for f/u Dr. Annella.   Donnice JONELLE Annella, MD 09/26/2024

## 2024-09-26 NOTE — Patient Instructions (Addendum)
 Nice to see you again  No changes to medicines  Return to clinic in 6 months or sooner as needed with Dr. Annella

## 2024-10-16 ENCOUNTER — Encounter: Payer: Self-pay | Admitting: Physician Assistant

## 2024-10-18 ENCOUNTER — Ambulatory Visit: Admitting: Physician Assistant

## 2024-10-21 ENCOUNTER — Other Ambulatory Visit: Payer: Self-pay

## 2024-10-21 MED ORDER — OLMESARTAN MEDOXOMIL 40 MG PO TABS: 40.0000 mg | ORAL_TABLET | Freq: Every day | ORAL | 3 refills | Status: AC

## 2024-10-29 DIAGNOSIS — Z23 Encounter for immunization: Secondary | ICD-10-CM | POA: Diagnosis not present

## 2024-11-20 ENCOUNTER — Other Ambulatory Visit: Payer: Self-pay | Admitting: Internal Medicine

## 2024-11-25 NOTE — Progress Notes (Unsigned)
 "  Chief Complaint: Positive Cologuard  HPI:    [name] is a 74 year old Caucasian female, previously known to Dr. Avram, with a past medical history as listed below including pulmonary fibrosis and pulmonary hypertension, CAD and higher PE on Xarelto  (10/11/2022 echo with LVEF 60-65% grade 1 diastolic dysfunction), who was referred to me by Perri Ronal PARAS, MD for a complaint of positive Cologuard.      03/28/2013 colonoscopy with severe diverticulosis in the sigmoid colon otherwise normal.  Repeat recommended in 2024.    01/15/2024 CMP with a glucose of 110 creatinine 1.32 and otherwise normal.  CBC with elevated eosinophils and otherwise normal.    07/23/2024 TSH and hepatic function panel normal.    08/29/24 positive Cologuard.    No documentation of difficult intubation or difficult airway.  Discussed the use of AI scribe software for clinical note transcription with the patient, who gave verbal consent to proceed.  History of Present Illness [name] is a 74 year old female with prior sigmoidectomy for diverticulitis who presents for evaluation following an abnormal Cologuard test.  Colonoscopy in 2014 was negative. In June 2015, she underwent sigmoidectomy for diverticulitis that persisted for approximately six months. Since surgery, she has not experienced recurrent diverticulitis symptoms or similar pain and has done well postoperatively.  Recently, colorectal cancer screening was recommended due to the ten-year interval since her last colonoscopy. She chose Cologuard, which returned abnormal. She has not seen her primary care physician since receiving the result and scheduled this appointment immediately upon learning of the abnormality.  Since her surgery, she has experienced intermittent constipation and diarrhea but denies abdominal pain. No weight loss. Occasional bright red blood per rectum occurs, attributed to hemorrhoids, especially with harder bowel  movements.  She is currently taking Xarelto  for prior pulmonary embolism. Pulmonary fibrosis and pulmonary hypertension are present, with oxygen saturation typically in the high 80s to 90s. She is not on supplemental oxygen and uses an Advair inhaler.  She has previously undergone both colonoscopy and endoscopy without procedural issues, except for difficulty tolerating certain bowel prep solutions. Miralax  is well tolerated and preferred.  Denies fever, chills or weight loss.      Past Medical History:  Diagnosis Date   Anemia    none recently   Anxiety    Arthritis    Asthma    Complete tear of right rotator cuff 12/05/2014   Coronary artery disease    Depression    Diabetes mellitus    Last A1C was 6.2 on 09/18/2020   Diverticulitis    Diverticulosis    Family history of adverse reaction to anesthesia    mom  nausea and vomiting   GERD (gastroesophageal reflux disease)    History of kidney stones    seen on scan, has not been a problem.   Hypertension    Hypothyroidism    Migraine    Personal history of kidney stones    Pneumonia    hx   Pulmonary embolism (HCC)    a. diagnosed in 01/2017 w/ imaging showing multiple right lung PE with right heart strain. Started on Xarelto .    Shortness of breath dyspnea    exersion   Thyroid  disease    hypo   Wears glasses     Past Surgical History:  Procedure Laterality Date   ABDOMINAL HYSTERECTOMY     CATARACT EXTRACTION, BILATERAL     CESAREAN SECTION     CHOLECYSTECTOMY N/A 02/16/2021   Procedure:  LAPAROSCOPIC CHOLECYSTECTOMY;  Surgeon: Vernetta Berg, MD;  Location: [facility] OR;  Service: General;  Laterality: N/A;   COLONOSCOPY     multiple    CORONARY PRESSURE/FFR STUDY N/A 01/14/2019   Procedure: INTRAVASCULAR PRESSURE WIRE/FFR STUDY;  Surgeon: Claudene Victory ORN, MD;  Location: [facility];  Service: Cardiovascular;  Laterality: N/A;   ESOPHAGOGASTRODUODENOSCOPY ENDOSCOPY     multiple   KNEE CARTILAGE SURGERY     Left    LAPAROSCOPIC PARTIAL COLECTOMY N/A 04/22/2013   Procedure: LAPAROSCOPIC PARTIAL COLECTOMY;  Surgeon: Krystal JINNY Russell, MD;  Location: [facility] ORS;  Service: General;  Laterality: N/A;   LEFT HEART CATH AND CORONARY ANGIOGRAPHY N/A 01/14/2019   Procedure: LEFT HEART CATH AND CORONARY ANGIOGRAPHY;  Surgeon: Claudene Victory ORN, MD;  Location: [facility];  Service: Cardiovascular;  Laterality: N/A;   right foot surgery  little toe and next toe   x 3   SHOULDER ARTHROSCOPY WITH ROTATOR CUFF REPAIR AND SUBACROMIAL DECOMPRESSION Right 12/05/2014   Procedure: RIGHT SHOULDER ARTHROSCOPY,ACROMOPLASTY, ROTATOR CUFF REPAIR;  Surgeon: Fonda SHAUNNA Olmsted, MD;  Location: [facility];  Service: Orthopedics;  Laterality: Right;    Current Outpatient Medications  Medication Sig Dispense Refill   albuterol  (VENTOLIN  HFA) 108 (90 Base) MCG/ACT inhaler TAKE 2 PUFFS BY MOUTH EVERY 6 HOURS AS NEEDED FOR WHEEZE OR SHORTNESS OF BREATH 18 each 11   ALPRAZolam  (XANAX ) 0.5 MG tablet TAKE 1 TABLET BY MOUTH TWICE A DAY AS NEEDED 180 tablet 1   atorvastatin  (LIPITOR ) 80 MG tablet TAKE 1 TABLET BY MOUTH DAILY AT 6 PM. 90 tablet 3   cetirizine (ZYRTEC) 10 MG tablet Take 10 mg by mouth at bedtime.     clobetasol  ointment (TEMOVATE ) 0.05 % Apply 1 application topically 2 (two) times daily as needed (skin irritation).     EPINEPHrine  0.3 mg/0.3 mL IJ SOAJ injection Inject 0.3 mLs (0.3 mg total) into the muscle as needed for anaphylaxis. 1 each 11   escitalopram  (LEXAPRO ) 10 MG tablet TAKE 1 TABLET BY MOUTH EVERY DAY 90 tablet 3   famotidine  (PEPCID ) 10 MG tablet Take 10 mg by mouth at bedtime.     furosemide  (LASIX ) 40 MG tablet TAKE 1 TABLET BY MOUTH EVERY DAY 90 tablet 3   levothyroxine  (SYNTHROID ) 200 MCG tablet Take 1 tablet (200 mcg total) by mouth daily. 90 tablet 3   magnesium  oxide (MAG-OX) 400 MG tablet Take 1 tablet by mouth on Tuesdays, Thursdays, Saturdays, & Sundays.  Take 1 tablet by mouth twice a day on  Mondays, Wednesdays, & Fridays (Patient taking differently: Take 400 mg by mouth See admin instructions. Take 400 mg by mouth once daily on Tuesdays, Thursdays, Saturdays, & Sundays and take 400 mg twice a day on Mondays, Wednesdays, & Fridays) 60 tablet 11   metoprolol  tartrate (LOPRESSOR ) 25 MG tablet Take 1 tablet (25 mg total) by mouth daily. 90 tablet 3   montelukast  (SINGULAIR ) 10 MG tablet TAKE 1 TABLET BY MOUTH EVERYDAY AT BEDTIME 90 tablet 3   Multiple Vitamins-Minerals (PRESERVISION AREDS 2 PO) Take 1 capsule by mouth daily.     olmesartan  (BENICAR ) 40 MG tablet Take 1 tablet (40 mg total) by mouth daily. 90 tablet 3   ondansetron  (ZOFRAN -ODT) 4 MG disintegrating tablet 4mg  ODT q4 hours prn nausea/vomit 6 tablet 0   traZODone  (DESYREL ) 150 MG tablet TAKE 1 TABLET BY MOUTH EVERYDAY AT BEDTIME 90 tablet 1   WIXELA INHUB 500-50 MCG/ACT AEPB INHALE 1 PUFF  INTO THE LUNGS IN THE MORNING AND AT BEDTIME. 60 each 11   XARELTO  20 MG TABS tablet TAKE 1 TABLET BY MOUTH DAILY WITH SUPPER. 90 tablet 1   zolpidem  (AMBIEN ) 10 MG tablet TAKE 1 TABLET BY MOUTH EVERY DAY AT BEDTIME AS NEEDED 90 tablet 1   No current facility-administered medications for this visit.    Allergies as of 11/26/2024 - Review Complete 09/26/2024  Allergen Reaction Noted   Aspirin  Shortness Of Breath 01/14/2019   Lisinopril Anaphylaxis and Other (See Comments) 07/10/2008   Bee venom Swelling 05/27/2011   Morphine  and codeine Itching and Other (See Comments) 05/27/2011    Family History  Problem Relation Age of Onset   Heart disease Mother    Kidney failure Father    Diabetes Father    Breast cancer Paternal Aunt    Colon cancer Neg Hx     Social History   Socioeconomic History   Marital status: Single    Spouse name: Not on file   Number of children: Not on file   Years of education: Not on file   Highest education level: Associate degree: occupational, scientist, product/process development, or vocational program  Occupational History    Not on file  Tobacco Use   Smoking status: Former    Current packs/day: 0.00    Average packs/day: 0.5 packs/day for 6.0 years (3.0 ttl pk-yrs)    Types: Cigarettes    Start date: 11/14/1976    Quit date: 11/14/1982    Years since quitting: 42.0   Smokeless tobacco: Never   Tobacco comments:    quit smoking 30 years ago  Vaping Use   Vaping status: Never Used  Substance and Sexual Activity   Alcohol use: No   Drug use: No   Sexual activity: Not on file  Other Topics Concern   Not on file  Social History Narrative   Social history: She is divorced.  Does not smoke.  Occasional alcohol consumption.  Longstanding history of morbid obesity.  Weight in 1996 was 307 pounds.  She retired as public librarian at [employer].       Family history: Father deceased with history of stroke, hypertension, diabetes and COPD.  Mother deceased with history of congestive heart failure and chronic kidney disease.  1 sister and 1 son in good health.   Social Drivers of Health   Tobacco Use: Medium Risk (09/26/2024)   Patient History    Smoking Tobacco Use: Former    Smokeless Tobacco Use: Never    Passive Exposure: Not on file  Financial Resource Strain: Low Risk (07/20/2024)   Overall Financial Resource Strain (CARDIA)    Difficulty of Paying Living Expenses: Not hard at all  Food Insecurity: No Food Insecurity (07/20/2024)   Epic    Worried About Programme Researcher, Broadcasting/film/video in the Last Year: Never true    Ran Out of Food in the Last Year: Never true  Transportation Needs: No Transportation Needs (07/20/2024)   Epic    Lack of Transportation (Medical): No    Lack of Transportation (Non-Medical): No  Physical Activity: Inactive (07/20/2024)   Exercise Vital Sign    Days of Exercise per Week: 0 days    Minutes of Exercise per Session: Not on file  Stress: No Stress Concern Present (07/20/2024)   [employer] of Occupational Health - Occupational Stress Questionnaire    Feeling of  Stress: Only a little  Social Connections: Moderately Integrated (07/20/2024)   Social Connection and  Isolation Panel    Frequency of Communication with Friends and Family: More than three times a week    Frequency of Social Gatherings with Friends and Family: Three times a week    Attends Religious Services: More than 4 times per year    Active Member of Clubs or Organizations: Yes    Attends Banker Meetings: 1 to 4 times per year    Marital Status: Divorced  Intimate Partner Violence: Not At Risk (01/16/2024)   Humiliation, Afraid, Rape, and Kick questionnaire    Fear of Current or Ex-Partner: No    Emotionally Abused: No    Physically Abused: No    Sexually Abused: No  Depression (PHQ2-9): Low Risk (07/25/2024)   Depression (PHQ2-9)    PHQ-2 Score: 0  Alcohol Screen: Low Risk (07/20/2024)   Alcohol Screen    Last Alcohol Screening Score (AUDIT): 1  Housing: Low Risk (07/20/2024)   Epic    Unable to Pay for Housing in the Last Year: No    Number of Times Moved in the Last Year: 0    Homeless in the Last Year: No  Utilities: Not At Risk (01/16/2024)   AHC Utilities    Threatened with loss of utilities: No  Health Literacy: Adequate Health Literacy (01/16/2024)   B1300 Health Literacy    Frequency of need for help with medical instructions: Never    Review of Systems:    Constitutional: No weight loss, fever or chills Skin: No rash  Cardiovascular: No chest pain Respiratory: No SOB  Gastrointestinal: See HPI and otherwise negative Genitourinary: No dysuria  Neurological: No headache, dizziness or syncope Musculoskeletal: No new muscle or joint pain Hematologic: No bleeding  Psychiatric: No history of depression or anxiety   Physical Exam:  Vital signs: BP 110/62 (BP Location: Right Wrist, Patient Position: Sitting, Cuff Size: Normal)   Pulse 62   Ht 5' 4 (1.626 m)   Wt 294 lb (133.4 kg)   BMI 50.46 kg/m    Constitutional:   Pleasant elderly morbidly obese  Caucasian female appears to be in NAD, Well developed, Well nourished, alert and cooperative Head:  Normocephalic and atraumatic. Eyes:   PEERL, EOMI. No icterus. Conjunctiva pink. Ears:  Normal auditory acuity. Neck:  Supple Throat: Oral cavity and pharynx without inflammation, swelling or lesion.  Respiratory: Respirations even and unlabored. Lungs clear to auscultation bilaterally.   No wheezes, crackles, or rhonchi.  Cardiovascular: Normal S1, S2. No MRG. Regular rate and rhythm. No peripheral edema, cyanosis or pallor.  Gastrointestinal:  Soft, nondistended, nontender. No rebound or guarding. Normal bowel sounds. No appreciable masses or hepatomegaly. Rectal:  Not performed.  Msk:  Symmetrical without gross deformities. Without edema, no deformity or joint abnormality. +ambulates with walker Neurologic:  Alert and  oriented x4;  grossly normal neurologically.  Skin:   Dry and intact without significant lesions or rashes. Psychiatric: Demonstrates good judgement and reason without abnormal affect or behaviors.  MOST RECENT LABS: CBC    Component Value Date/Time   WBC 9.2 01/15/2024 1102   RBC 4.87 01/15/2024 1102   HGB 13.7 01/15/2024 1102   HGB 11.9 01/23/2019 0910   HGB 12.9 09/05/2006 1321   HCT 43.1 01/15/2024 1102   HCT 37.7 01/23/2019 0910   HCT 38.2 09/05/2006 1321   PLT 262 01/15/2024 1102   PLT 348 01/23/2019 0910   MCV 88.5 01/15/2024 1102   MCV 87 01/23/2019 0910   MCV 85.1 09/05/2006 1321   MCH 28.1  01/15/2024 1102   MCHC 31.8 (L) 01/15/2024 1102   RDW 12.6 01/15/2024 1102   RDW 13.4 01/23/2019 0910   RDW 13.6 09/05/2006 1321   LYMPHSABS 2,866 11/29/2022 0943   LYMPHSABS 2.7 01/23/2019 0910   LYMPHSABS 3.1 09/05/2006 1321   MONOABS 2.3 (H) 01/15/2019 0355   MONOABS 0.7 09/05/2006 1321   EOSABS 653 (H) 01/15/2024 1102   EOSABS 0.6 (H) 01/23/2019 0910   BASOSABS 101 01/15/2024 1102   BASOSABS 0.1 01/23/2019 0910   BASOSABS 0.0 09/05/2006 1321    CMP      Component Value Date/Time   NA 142 01/15/2024 1102   NA 141 09/13/2022 1208   K 5.0 01/15/2024 1102   CL 105 01/15/2024 1102   CO2 30 01/15/2024 1102   GLUCOSE 110 (H) 01/15/2024 1102   BUN 19 01/15/2024 1102   BUN 22 09/13/2022 1208   CREATININE 1.32 (H) 01/15/2024 1102   CALCIUM  10.0 01/15/2024 1102   PROT 6.5 07/23/2024 1227   PROT 6.6 05/08/2019 1013   ALBUMIN  3.8 08/22/2023 2026   ALBUMIN  4.0 05/08/2019 1013   AST 16 07/23/2024 1227   ALT 13 07/23/2024 1227   ALKPHOS 86 08/22/2023 2026   BILITOT 0.6 07/23/2024 1227   BILITOT 0.4 05/08/2019 1013   GFRNONAA 42 (L) 08/22/2023 2026   GFRNONAA 48 (L) 04/27/2021 1240   GFRAA 56 (L) 04/27/2021 1240   Assessment & Plan Positive Cologuard Asymptomatic abnormal Cologuard result from October 2025 suggests at least one colorectal polyp of uncertain pathology, necessitating further evaluation to exclude malignancy. - Scheduled colonoscopy with Dr. Avram at the hospital given BMI greater than 50 for evaluation and possible polypectomy.  Did provide the patient a detailed list of risks of procedure and she agrees to proceed. - Ordered Miralax  bowel preparation due to better tolerance compared to other regimens. - Planned to hold Xarelto  for two days prior to procedure to reduce bleeding risk if polypectomy is performed. - Planned to contact her prescribing provider, Dr. Delora, to confirm safety of holding Xarelto . - Will call the patient back to schedule appointment at the hospital  History of sigmoidectomy for diverticulitis Remote sigmoidectomy in 2015 with good postoperative recovery and no recurrence of diverticulitis. - Reviewed surgical history and confirmed absence of current issues related to prior sigmoidectomy.  Constipation and intermittent diarrhea Chronic intermittent constipation and diarrhea since sigmoidectomy, without acute changes or alarm features.  Hemorrhoids with intermittent rectal bleeding Intermittent  bright red rectal bleeding attributed to hemorrhoids, without evidence of more serious pathology. - Reviewed symptoms and attributed bleeding to hemorrhoids; no new interventions indicated.  Patient to follow in clinic per recommendations after time of procedure.   Delon Failing, PA-C [facility] 11/25/2024, 11:11 AM  Cc: Perri Ronal PARAS, MD  "

## 2024-11-26 ENCOUNTER — Ambulatory Visit: Admitting: Physician Assistant

## 2024-11-26 ENCOUNTER — Encounter: Payer: Self-pay | Admitting: Physician Assistant

## 2024-11-26 VITALS — BP 110/62 | HR 62 | Ht 64.0 in | Wt 294.0 lb

## 2024-11-26 DIAGNOSIS — K649 Unspecified hemorrhoids: Secondary | ICD-10-CM | POA: Diagnosis not present

## 2024-11-26 DIAGNOSIS — K5900 Constipation, unspecified: Secondary | ICD-10-CM

## 2024-11-26 DIAGNOSIS — R197 Diarrhea, unspecified: Secondary | ICD-10-CM

## 2024-11-26 DIAGNOSIS — Z8719 Personal history of other diseases of the digestive system: Secondary | ICD-10-CM

## 2024-11-26 DIAGNOSIS — Z9049 Acquired absence of other specified parts of digestive tract: Secondary | ICD-10-CM

## 2024-11-26 DIAGNOSIS — K625 Hemorrhage of anus and rectum: Secondary | ICD-10-CM | POA: Diagnosis not present

## 2024-11-26 DIAGNOSIS — R195 Other fecal abnormalities: Secondary | ICD-10-CM

## 2024-11-26 DIAGNOSIS — R194 Change in bowel habit: Secondary | ICD-10-CM

## 2024-11-26 NOTE — Patient Instructions (Signed)
 You will be contacted by our office prior to your procedure for directions on holding your Xarelto .  If you do not hear from our office 1 week prior to your scheduled procedure, please call [phone] to discuss.   You have been scheduled for a colonoscopy. Please follow written instructions given to you at your visit today.   If you use inhalers (even only as needed), please bring them with you on the day of your procedure.  DO NOT TAKE 7 DAYS PRIOR TO TEST- Trulicity (dulaglutide) Ozempic, Wegovy (semaglutide) Mounjaro, Zepbound (tirzepatide) Bydureon Bcise (exanatide extended release)  DO NOT TAKE 1 DAY PRIOR TO YOUR TEST Rybelsus (semaglutide) Adlyxin (lixisenatide) Victoza (liraglutide) Byetta (exanatide) ___________________________________________________________________________

## 2025-01-16 ENCOUNTER — Other Ambulatory Visit: Payer: PRIVATE HEALTH INSURANCE

## 2025-01-20 ENCOUNTER — Ambulatory Visit: Payer: PRIVATE HEALTH INSURANCE | Admitting: Internal Medicine

## 2025-01-23 ENCOUNTER — Ambulatory Visit (HOSPITAL_COMMUNITY): Admit: 2025-01-23 | Admitting: Internal Medicine

## 2025-01-23 ENCOUNTER — Encounter (HOSPITAL_COMMUNITY): Payer: Self-pay
